# Patient Record
Sex: Male | Born: 1935 | Race: White | Hispanic: No
Health system: Southern US, Community
[De-identification: ages and names within clinical notes are randomized; demographics above are authoritative.]

## PROBLEM LIST (undated history)

## (undated) DIAGNOSIS — I739 Peripheral vascular disease, unspecified: Secondary | ICD-10-CM

## (undated) DIAGNOSIS — I509 Heart failure, unspecified: Secondary | ICD-10-CM

## (undated) DIAGNOSIS — I219 Acute myocardial infarction, unspecified: Secondary | ICD-10-CM

## (undated) DIAGNOSIS — I10 Essential (primary) hypertension: Secondary | ICD-10-CM

## (undated) DIAGNOSIS — N189 Chronic kidney disease, unspecified: Secondary | ICD-10-CM

## (undated) DIAGNOSIS — I2510 Atherosclerotic heart disease of native coronary artery without angina pectoris: Secondary | ICD-10-CM

## (undated) DIAGNOSIS — E119 Type 2 diabetes mellitus without complications: Secondary | ICD-10-CM

## (undated) DIAGNOSIS — Z951 Presence of aortocoronary bypass graft: Secondary | ICD-10-CM

## (undated) HISTORY — PX: CARPAL TUNNEL RELEASE: SHX101

## (undated) HISTORY — PX: CHOLECYSTECTOMY: SHX55

## (undated) HISTORY — PX: TONSILLECTOMY: SUR1361

## (undated) HISTORY — DX: Heart failure, unspecified: I50.9

## (undated) HISTORY — DX: Atherosclerotic heart disease of native coronary artery without angina pectoris: I25.10

## (undated) HISTORY — DX: Chronic kidney disease, unspecified: N18.9

## (undated) HISTORY — PX: HEMORRHOID SURGERY: SHX153

## (undated) HISTORY — PX: OTHER SURGICAL HISTORY: SHX169

## (undated) HISTORY — DX: Type 2 diabetes mellitus without complications: E11.9

## (undated) HISTORY — PX: EYE SURGERY: SHX253

## (undated) HISTORY — DX: Peripheral vascular disease, unspecified: I73.9

---

## 2012-12-10 ENCOUNTER — Ambulatory Visit: Payer: Self-pay | Admitting: Internal Medicine

## 2014-12-19 ENCOUNTER — Ambulatory Visit: Payer: Self-pay | Admitting: Internal Medicine

## 2014-12-27 ENCOUNTER — Ambulatory Visit: Payer: Self-pay | Admitting: Internal Medicine

## 2015-01-17 DIAGNOSIS — J439 Emphysema, unspecified: Secondary | ICD-10-CM | POA: Insufficient documentation

## 2015-02-01 ENCOUNTER — Ambulatory Visit: Payer: Self-pay | Admitting: Specialist

## 2015-03-06 ENCOUNTER — Ambulatory Visit
Admit: 2015-03-06 | Disposition: A | Discharge status: Home / Self Care | Payer: Self-pay | Attending: Vascular Surgery | Admitting: Vascular Surgery

## 2015-03-06 LAB — BASIC METABOLIC PANEL
Anion Gap: 6 — ABNORMAL LOW (ref 7–16)
BUN: 14 mg/dL
CALCIUM: 9.4 mg/dL
CHLORIDE: 101 mmol/L
CO2: 28 mmol/L
Creatinine: 1.08 mg/dL
EGFR (African American): >60
EGFR (Non-African Amer.): >60
Glucose: 107 mg/dL — ABNORMAL HIGH
POTASSIUM: 4.3 mmol/L
Sodium: 135 mmol/L

## 2015-03-06 LAB — CBC
HCT: 38.5 % — ABNORMAL LOW (ref 40.0–52.0)
HGB: 12.7 g/dL — AB (ref 13.0–18.0)
MCH: 32 pg (ref 26.0–34.0)
MCHC: 33.1 g/dL (ref 32.0–36.0)
MCV: 97 fL (ref 80–100)
Platelet: 249 10*3/uL (ref 150–440)
RBC: 3.97 10*6/uL — AB (ref 4.40–5.90)
RDW: 12.8 % (ref 11.5–14.5)
WBC: 7.5 10*3/uL (ref 3.8–10.6)

## 2015-03-06 LAB — PROTIME-INR
INR: 1
Prothrombin Time: 13.4 secs

## 2015-03-06 LAB — APTT: Activated PTT: 27.8 secs (ref 23.6–35.9)

## 2015-03-17 ENCOUNTER — Other Ambulatory Visit: Payer: Self-pay | Admitting: Specialist

## 2015-03-17 DIAGNOSIS — R911 Solitary pulmonary nodule: Secondary | ICD-10-CM

## 2015-03-22 ENCOUNTER — Inpatient Hospital Stay
Admit: 2015-03-22 | Disposition: A | Discharge status: Home / Self Care | Payer: Self-pay | Attending: Vascular Surgery | Admitting: Vascular Surgery

## 2015-03-23 LAB — CBC WITH DIFFERENTIAL/PLATELET
BASOS PCT: 0.5 %
Basophil #: 0 10*3/uL (ref 0.0–0.1)
Eosinophil #: 0.3 10*3/uL (ref 0.0–0.7)
Eosinophil %: 3.4 %
HCT: 34.3 % — AB (ref 40.0–52.0)
HGB: 11.5 g/dL — ABNORMAL LOW (ref 13.0–18.0)
Lymphocyte #: 0.6 10*3/uL — ABNORMAL LOW (ref 1.0–3.6)
Lymphocyte %: 7.5 %
MCH: 32.2 pg (ref 26.0–34.0)
MCHC: 33.5 g/dL (ref 32.0–36.0)
MCV: 96 fL (ref 80–100)
MONO ABS: 0.6 x10 3/mm (ref 0.2–1.0)
Monocyte %: 8.3 %
NEUTROS ABS: 6 10*3/uL (ref 1.4–6.5)
Neutrophil %: 80.3 %
Platelet: 171 10*3/uL (ref 150–440)
RBC: 3.57 10*6/uL — ABNORMAL LOW (ref 4.40–5.90)
RDW: 12.3 % (ref 11.5–14.5)
WBC: 7.4 10*3/uL (ref 3.8–10.6)

## 2015-03-23 LAB — COMPREHENSIVE METABOLIC PANEL
ALBUMIN: 3.4 g/dL — AB
ALK PHOS: 49 U/L
Anion Gap: 7 (ref 7–16)
BILIRUBIN TOTAL: 0.7 mg/dL
BUN: 12 mg/dL
CHLORIDE: 103 mmol/L
Calcium, Total: 8.1 mg/dL — ABNORMAL LOW
Co2: 26 mmol/L
Creatinine: 1.09 mg/dL
EGFR (African American): >60
GLUCOSE: 145 mg/dL — AB
POTASSIUM: 4.3 mmol/L
SGOT(AST): 17 U/L
SGPT (ALT): 13 U/L — ABNORMAL LOW
Sodium: 136 mmol/L
Total Protein: 5.3 g/dL — ABNORMAL LOW

## 2015-03-23 LAB — APTT: Activated PTT: 29.7 secs (ref 23.6–35.9)

## 2015-03-23 LAB — PROTIME-INR
INR: 1.2
Prothrombin Time: 14.9 secs

## 2015-03-23 LAB — MAGNESIUM: Magnesium: 1.6 mg/dL — ABNORMAL LOW

## 2015-03-23 LAB — PHOSPHORUS: Phosphorus: 3.7 mg/dL

## 2015-04-02 NOTE — Op Note (Signed)
PATIENT NAME:  Peter Becker, Peter Becker MR#:  [id number] DATE OF BIRTH:  Dec 03, 1928  DATE OF PROCEDURE:  03/22/2015  PREOPERATIVE DIAGNOSES: 1.  Abdominal aortic aneurysm.  2.  Atherosclerotic occlusive disease, bilateral lower extremities, with claudication.  3.  Coronary artery disease.    POSTOPERATIVE DIAGNOSES:   1.  Abdominal aortic aneurysm.  2.  Atherosclerotic occlusive disease, bilateral lower extremities, with claudication.  3.  Coronary artery disease.    PROCEDURES PERFORMED: 1.  Introduction catheter into aorta percutaneously, right groin approach.  2.  Introduction catheter into aorta percutaneously, left groin approach.  3.  Repair abdominal aortic aneurysm with a Gore Excluder endoprosthesis.  4.  Extension left iliac with a 10 x 10 Viabahn extender.   SURGEON:  Katha Cabal, MD  CO-SURGEONS: Two.   ANESTHESIA: General by endotracheal intubation.   FLUIDS: Per anesthesia record.   ESTIMATED BLOOD LOSS: 50 mL.   SPECIMEN: None.   FLUOROSCOPY TIME: 17.5 minutes.   CONTRAST USED: Isovue 105 mL.   INDICATIONS: Peter Becker is an 81 year old gentleman with a known abdominal aortic aneurysm, which has steadily grown every 6 months. He now recently measured approximately 4.8 to 4.9 cm in diameter and because of his steady growth and the close proximity to 5, he has requested that his aneurysm be fixed. The risks and benefits were reviewed. All questions were answered. Alternative therapies have been discussed and the patient has agreed to proceed with endovascular repair.   DESCRIPTION OF PROCEDURE: The patient is taken to the special procedure suite, placed in the supine position. After adequate general anesthesia is induced and appropriate invasive monitors are placed, he is positioned supine and prepped from just below the nipple line to just above the knees. He is then draped in sterile fashion. Appropriate timeout is called.   With Dr. Lucky Cowboy, working on the left side and  myself working on the right, ultrasound is utilized and femoral access is obtained percutaneously, myself using a micropuncture kit, and Dr. Lucky Cowboy using a Seldinger needle, 6 French sheaths were then placed to be noted that ultrasound was used to observe the femoral artery, make note of its echolucent and pulsatility indicating patency, image was recorded for the permanent record on both sides and ultrasound was used real-time for the puncture.   Subsequently, Perclose devices were used in a pre-close fashion. The right side required several extra devices as the needle did not capture ultimately 2 devices were secured. This went without any incident on the left, and therefore it was elected to place the main body up the left side. Wire and pigtail catheter are then advanced up the left, a wire and KMP catheter advanced up the right. AP projection of the aorta is obtained and the renals were then marked.   Heparin 4000 units were given and allowed to circulate for approximately 5 minutes.   The Amplatz Super Stiff wire is then advanced through the pigtail catheter on the left and the sheath is exchanged for an 18 French sheath, under fluoroscopic guidance. With the KMP catheter located just above the renals, magnified image with LAO angulation is then performed. Under real-time fluoroscopic guidance, the Gore main body which is a C3 Excluder 35 x 14 x 16, is advanced through the sheath on the left, positioned just below the approximate level of the renals and a magnified image of the renals is obtained. The Gore device is then deployed.   Follow-up imaging demonstrates that is sitting just below the right renal  which is the lowest renal, landed in perfect position. The device is then opened down to expose the contralateral gate; the Kumpe catheter is pulled back into the aneurysm sac and ultimately, a C2 catheter with a Glidewire is utilized to secure the contralateral gate. Wire is then advanced. Pigtail  catheter is then advanced over the wire and twirled within the main body to verify proper positioning. Amplatz Super Stiff wire is then advanced through the pigtail catheter, and the sheath is upsized to a 12 [place].   With the pigtail catheter in position as the markers, an LAO projection of the pelvis is obtained, length measurements are made and subsequently a 14 x 10, contralateral limb is selected, prepped on the back table, and then advanced, and deployed in the contralateral gate.   CODA balloon is then used to secure both the proximal, as well as the distal portions of the graft. CODA balloon is then advanced up the left side as well. Follow-up imaging demonstrates that there is inadequate length on the left, both for treatment of the aneurysm as well as some fairly severe atherosclerotic disease at the distal margin of the stent, creating a less than ideal situation and therefore the oblique magnified view is obtained, a 10 x 10 Viabahn is then deployed across this extending into the external iliac to allow for proper seal and treat this lesion that is present at the origin of the external. The right side is re-dilated with a 14 balloon, the left side proximally across the Viabahn is dilated with a 14 balloon, and distally is dilated with an 8 balloon. Follow-up imaging now for evaluation of endoleaks does determine that there is an indeterminate endoleak. There does appear to be a pair of lumbars suggesting this is a type 2; however, re- angioplastied as noted above, is performed. Several different oblique views are obtained ultimately this appears to be diminished with each image and does not appear to be a type 1; therefore, will allow for reversal of his anticoagulation and follow-up scans will be noted to see if this is indeed a persistent type 2 endoleak.   Both sheaths are removed, and the Perclose device is secured successfully. Small puncture sites were closed with 4-0 Monocryl and Dermabond,  followed by safeguards.   The patient tolerated the procedure well. There were no immediate complications. Sponge and needle counts are correct and he was taken to the recovery room in stable condition.    ____________________________ Katha Cabal, MD ggs:nt D: 03/22/2015 13:09:11 ET T: 03/22/2015 16:37:28 ET JOB#: [id number]  cc: Katha Cabal, MD, <Dictator> Katha Cabal MD ELECTRONICALLY SIGNED 03/28/2015 12:48

## 2015-04-02 NOTE — Op Note (Signed)
PATIENT NAME:  Peter Becker, Peter Becker MR#:  [id number] DATE OF BIRTH:  02-23-1929  DATE OF PROCEDURE:  03/22/2015  This will be a co-surgeon note along with Levora Dredge, MD who will be dictating a co-surgeon note as well.    PREOPERATIVE DIAGNOSES:  1.  Abdominal aortic aneurysm.  2.  Peripheral arterial disease with iliac artery stenosis.  3.  Diabetes.  4.  Coronary disease.   POSTOPERATIVE DIAGNOSES:  1.  Abdominal aortic aneurysm.  2.  Peripheral arterial disease with iliac artery stenosis.  3.  Diabetes.  4.  Coronary disease.   PROCEDURES:  1.  Ultrasound guidance for vascular access to bilateral femoral arteries; right by Dr. Gilda Crease,  left by Dr. Wyn Quaker.  2.  Catheter placement to aorta from bilateral femoral approaches, right by Dr. Gilda Crease, left by Dr. Wyn Quaker.  3.  Placement of a Gore Excluder endoprosthesis, main body left with a 35 mm diameter x 16 cm length and 14 mm diameter x 10 cm length contralateral limb on the right, co-surgeons Dr. Gilda Crease and Dr. Wyn Quaker.   4.  Placement of a left iliac extension stent graft with a 10 mm diameter x 10 cm length Viabahn stent, co-surgeons Dr. Gilda Crease and Dr. Wyn Quaker.   5.  Pergolide closure device bilateral femoral arteries, right by Dr. Gilda Crease, left by Dr. Wyn Quaker.     ANESTHESIA: General.   ESTIMATED BLOOD LOSS: 25 mL.   FLUOROSCOPY TIME: 17.3 minutes.   CONTRAST USED: 105 mL.   INDICATION FOR PROCEDURE: This is an 81 year old gentleman with an abdominal aortic aneurysm of acceptable size for repair approaching 5 cm.  He also has significant aortoiliac disease concomitant with his aneurysm. He is brought in for endovascular repair. Risks and benefits are discussed. Informed consent was obtained.   DESCRIPTION OF PROCEDURE: The patient was brought to the operative suite. After an adequate level of general anesthesia was obtained the abdomen and groins were sterilely prepped and draped and a sterile surgical field was created. We started by gaining  percutaneous access to each femoral artery. Dr. Gilda Crease performed the right while I performed the left. Ultrasound was used to visualize the femoral arteries and they were accessed at their least calcified point possible under direct ultrasound guidance with micropuncture needles and permanent images were recorded.  We upsized to 6 [place] sheaths. We then placed J-wires and put 2 ProGlides in a crossing fashion at each femoral artery in a preclose fashion and placed 8 French sheaths, heparinizing the patient with 5000 units of intravenous heparin. A pigtail catheter was placed up the left and an AP aortogram was performed. This demonstrated the expected infrarenal abdominal aortic aneurysm with a reasonably short neck. We then upsized over an Amplatz Super Stiff wire to an 18 French sheath on the left.  Dr. Gilda Crease placed a Kumpe catheter up from the right side and magnified imaging of the renal arteries performed. We deployed the graft in an anatomic configuration at the base of the right renal artery, which was lower.  At this point a puff of contrast done through the Kumpe catheter, which showed deployment in an excellent location. Dr. Gilda Crease then cannulated the contralateral gate without difficulty, twirling the pigtail catheter in the main body and shooting a retrograde arteriogram from the right femoral approach. With this we elected to place a 14 mm diameter x 10 cm right iliac limb that went to less than 1 cm above the hypogastric artery. We completed deployment of the main body. This demonstrated  significant stenosis and tortuosity of the external iliac artery. We needed to extend the stent graft down. Hypogastric artery was small and diseased and we elected to cover this to gain full seal of the aneurysm as well as treat the stenosis in the external iliac artery and fully cover the aneurysm, gaining good apposition throughout the iliacs. Due to the tortuosity the iliacs we elected to use a 10 mm diameter  x 10 cm length Viabahn stent graft. This was deployed from the left femoral sheath, ironed out with a 14 mm balloon within the limb in the distal aorta and proximal common iliac artery, and then with an 8 mm noncompliant balloon down into the external iliac artery. Completion angiogram was then performed. There was excellent flow through the stent graft, there was an indeterminate endoleak present at completion. It was unclear if this was a type 2. For the possibility this might be a small type 3 endoleak a 14 mm balloon was used at the contralateral limb and main body junction. It should be noted that the coated balloon was used to touch all seal points and junction points initially. At the conclusion of the procedure we had 2 patent renal arteries, the right hypogastric artery was widely patent, we had elected to cover the left hypogastric artery which was small and diseased to gain full seal of the aneurysm, and we had good flow through the stent graft. We now elected to terminate the procedure. The sheaths were removed. The ProGlides were secured on each side with good hemostasis, and the skin incision was closed with a single 4-0 Monocryl. Dermabond and pressure dressings were placed. The patient tolerated the procedure well and was taken to the recovery room in stable condition.    ____________________________ Annice NeedyJason S. Larone Kliethermes, MD jsd:bu D: 03/22/2015 12:41:46 ET T: 03/22/2015 16:18:07 ET JOB#: [id number]  cc: Annice NeedyJason S. Nakiea Metzner, MD, <Dictator> Annice NeedyJASON S Juneau Doughman MD ELECTRONICALLY SIGNED 03/23/2015 12:54

## 2015-06-28 ENCOUNTER — Other Ambulatory Visit: Payer: Self-pay

## 2015-07-17 ENCOUNTER — Ambulatory Visit
Admission: RE | Admit: 2015-07-17 | Discharge: 2015-07-17 | Disposition: A | Discharge status: Home / Self Care | Payer: Medicare Other | Source: Ambulatory Visit | Attending: Specialist | Admitting: Specialist

## 2015-07-17 DIAGNOSIS — I517 Cardiomegaly: Secondary | ICD-10-CM | POA: Insufficient documentation

## 2015-07-17 DIAGNOSIS — R918 Other nonspecific abnormal finding of lung field: Secondary | ICD-10-CM | POA: Insufficient documentation

## 2015-07-17 DIAGNOSIS — J439 Emphysema, unspecified: Secondary | ICD-10-CM | POA: Insufficient documentation

## 2015-07-17 DIAGNOSIS — R911 Solitary pulmonary nodule: Secondary | ICD-10-CM

## 2015-07-31 ENCOUNTER — Other Ambulatory Visit: Payer: Self-pay | Admitting: Specialist

## 2015-07-31 DIAGNOSIS — R918 Other nonspecific abnormal finding of lung field: Secondary | ICD-10-CM

## 2016-01-17 ENCOUNTER — Ambulatory Visit
Admission: RE | Admit: 2016-01-17 | Discharge: 2016-01-17 | Disposition: A | Discharge status: Home / Self Care | Payer: Medicare Other | Source: Ambulatory Visit | Attending: Specialist | Admitting: Specialist

## 2016-01-17 DIAGNOSIS — Z87891 Personal history of nicotine dependence: Secondary | ICD-10-CM | POA: Insufficient documentation

## 2016-01-17 DIAGNOSIS — R918 Other nonspecific abnormal finding of lung field: Secondary | ICD-10-CM | POA: Diagnosis present

## 2016-01-25 ENCOUNTER — Other Ambulatory Visit: Payer: Self-pay | Admitting: Specialist

## 2016-01-25 DIAGNOSIS — R918 Other nonspecific abnormal finding of lung field: Secondary | ICD-10-CM

## 2016-07-16 ENCOUNTER — Ambulatory Visit
Admission: RE | Admit: 2016-07-16 | Discharge: 2016-07-16 | Disposition: A | Discharge status: Home / Self Care | Payer: Medicare Other | Source: Ambulatory Visit | Attending: Specialist | Admitting: Specialist

## 2016-07-16 DIAGNOSIS — R918 Other nonspecific abnormal finding of lung field: Secondary | ICD-10-CM | POA: Insufficient documentation

## 2016-07-16 DIAGNOSIS — I700 Atherosclerosis of aorta: Secondary | ICD-10-CM | POA: Insufficient documentation

## 2016-07-25 ENCOUNTER — Other Ambulatory Visit: Payer: Self-pay | Admitting: Specialist

## 2016-07-25 DIAGNOSIS — R918 Other nonspecific abnormal finding of lung field: Secondary | ICD-10-CM

## 2016-10-16 ENCOUNTER — Ambulatory Visit
Admission: RE | Admit: 2016-10-16 | Discharge: 2016-10-16 | Disposition: A | Discharge status: Home / Self Care | Payer: Medicare Other | Source: Ambulatory Visit | Attending: Specialist | Admitting: Specialist

## 2016-10-16 DIAGNOSIS — R918 Other nonspecific abnormal finding of lung field: Secondary | ICD-10-CM | POA: Diagnosis present

## 2016-10-21 ENCOUNTER — Other Ambulatory Visit: Payer: Self-pay | Admitting: Specialist

## 2016-10-21 DIAGNOSIS — R911 Solitary pulmonary nodule: Secondary | ICD-10-CM

## 2016-12-02 DIAGNOSIS — I639 Cerebral infarction, unspecified: Secondary | ICD-10-CM

## 2016-12-02 HISTORY — DX: Cerebral infarction, unspecified: I63.9

## 2017-01-20 ENCOUNTER — Ambulatory Visit (INDEPENDENT_AMBULATORY_CARE_PROVIDER_SITE_OTHER): Payer: Medicare Other

## 2017-01-20 ENCOUNTER — Encounter (INDEPENDENT_AMBULATORY_CARE_PROVIDER_SITE_OTHER): Payer: Self-pay | Admitting: Vascular Surgery

## 2017-01-20 ENCOUNTER — Other Ambulatory Visit (INDEPENDENT_AMBULATORY_CARE_PROVIDER_SITE_OTHER): Payer: Self-pay | Admitting: Vascular Surgery

## 2017-01-20 ENCOUNTER — Ambulatory Visit (INDEPENDENT_AMBULATORY_CARE_PROVIDER_SITE_OTHER): Payer: Medicare Other | Admitting: Vascular Surgery

## 2017-01-20 DIAGNOSIS — I714 Abdominal aortic aneurysm, without rupture, unspecified: Secondary | ICD-10-CM

## 2017-01-20 DIAGNOSIS — I2510 Atherosclerotic heart disease of native coronary artery without angina pectoris: Secondary | ICD-10-CM | POA: Insufficient documentation

## 2017-01-20 DIAGNOSIS — Z48812 Encounter for surgical aftercare following surgery on the circulatory system: Secondary | ICD-10-CM

## 2017-01-20 DIAGNOSIS — Z8679 Personal history of other diseases of the circulatory system: Secondary | ICD-10-CM

## 2017-01-20 DIAGNOSIS — I739 Peripheral vascular disease, unspecified: Secondary | ICD-10-CM | POA: Insufficient documentation

## 2017-01-20 DIAGNOSIS — I10 Essential (primary) hypertension: Secondary | ICD-10-CM

## 2017-01-20 DIAGNOSIS — E785 Hyperlipidemia, unspecified: Secondary | ICD-10-CM | POA: Insufficient documentation

## 2017-01-20 DIAGNOSIS — I209 Angina pectoris, unspecified: Secondary | ICD-10-CM

## 2017-01-20 DIAGNOSIS — Z9889 Other specified postprocedural states: Secondary | ICD-10-CM

## 2017-01-20 DIAGNOSIS — I872 Venous insufficiency (chronic) (peripheral): Secondary | ICD-10-CM | POA: Insufficient documentation

## 2017-01-20 DIAGNOSIS — E782 Mixed hyperlipidemia: Secondary | ICD-10-CM | POA: Diagnosis not present

## 2017-01-20 DIAGNOSIS — I25118 Atherosclerotic heart disease of native coronary artery with other forms of angina pectoris: Secondary | ICD-10-CM

## 2017-01-20 NOTE — Progress Notes (Signed)
MRN : [id number]  Peter FretRalph Becker is a 83 y.o. (04/24/1929) male who presents with chief complaint of  Chief Complaint  Patient presents with  . Follow-up  .  History of Present Illness: The patient returns to the office for surveillance of an abdominal aortic aneurysm status post stent graft placement on 03/22/2015.   Patient denies abdominal pain or back pain, no other abdominal complaints. No groin related complaints. No symptoms consistent with distal embolization No changes in claudication distance.   There have been no interval changes in his overall healthcare since his last visit.   Patient denies amaurosis fugax or TIA symptoms. There is no history of claudication or rest pain symptoms of the lower extremities. The patient denies angina or shortness of breath.   Duplex US of the aorta and iliac arteries shows a 3.9 cm AAA sac with no endoleak, decrease in the sac compared to the previous study. Current Meds  Medication Sig  . Ascorbic Acid (VITAMIN C) 1000 MG tablet Take by mouth.  . chlorpheniramine (CHLOR-TRIMETON) 4 MG tablet Take by mouth.  . Cholecalciferol (VITAMIN D3) 2000 units capsule Take by mouth.  . cyanocobalamin (,VITAMIN B-12,) 1000 MCG/ML injection INJECT 1 ML IN THE MUSCLE EVERY 14 DAYS  . fluticasone (FLONASE) 50 MCG/ACT nasal spray Place into the nose.  Marland Kitchen. glyBURIDE-metformin (GLUCOVANCE) 2.5-500 MG tablet   . losartan (COZAAR) 50 MG tablet TK 1 T PO QD  . lovastatin (MEVACOR) 20 MG tablet Take by mouth.  . metoprolol tartrate (LOPRESSOR) 25 MG tablet Take by mouth.  . Multiple Vitamin (MULTI-VITAMINS) TABS Take by mouth.  . Multiple Vitamins-Minerals (OCUVITE EXTRA PO) Take by mouth.  . Omega-3 Fatty Acids (FISH OIL) 1000 MG CAPS Take by mouth daily.  . vitamin E 400 UNIT capsule Take by mouth.    Past Medical History:  Diagnosis Date  . Diabetes mellitus without complication [facility](HCC)     Past Surgical History:  Procedure Laterality Date  . CARPAL  TUNNEL RELEASE Left   . CHOLECYSTECTOMY    . EYE SURGERY    . heart stent     triple bypass  . HEMORRHOID SURGERY    . TONSILLECTOMY      Social History Social History  Substance Use Topics  . Smoking status: Former Games developermoker  . Smokeless tobacco: Never Used  . Alcohol use No    Family History Family History  Problem Relation Age of Onset  . Heart attack Father   No family history of bleeding/clotting disorders, porphyria or autoimmune disease   No Known Allergies   REVIEW OF SYSTEMS (Negative unless checked)  Constitutional: [] Weight loss  [] Fever  [] Chills Cardiac: [] Chest pain   [] Chest pressure   [] Palpitations   [] Shortness of breath when laying flat   [] Shortness of breath with exertion. Vascular:  [] Pain in legs with walking   [] Pain in legs at rest  [] History of DVT   [] Phlebitis   [] Swelling in legs   [] Varicose veins   [] Non-healing ulcers Pulmonary:   [] Uses home oxygen   [] Productive cough   [] Hemoptysis   [] Wheeze  [] COPD   [] Asthma Neurologic:  [] Dizziness   [] Seizures   [] History of stroke   [] History of TIA  [] Aphasia   [] Vissual changes   [] Weakness or numbness in arm   [] Weakness or numbness in leg Musculoskeletal:   [] Joint swelling   [] Joint pain   [] Low back pain Hematologic:  [] Easy bruising  [] Easy bleeding   [] Hypercoagulable state   []   Anemic Gastrointestinal:  [] Diarrhea   [] Vomiting  [] Gastroesophageal reflux/heartburn   [] Difficulty swallowing. Genitourinary:  [] Chronic kidney disease   [] Difficult urination  [] Frequent urination   [] Blood in urine Skin:  [] Rashes   [] Ulcers  Psychological:  [] History of anxiety   []  History of major depression.  Physical Examination  Vitals:   01/20/17 1002  BP: (!) 154/75  Pulse: 60  Resp: 16  Weight: 134 lb (60.8 kg)  Height: 5\' 7"  (1.702 m)   Body mass index is 20.99 kg/m. Gen: WD/WN, NAD Head: [place]/AT, No temporalis wasting.  Ear/Nose/Throat: Hearing grossly intact, nares w/o erythema or drainage, poor  dentition Eyes: PER, EOMI, sclera nonicteric.  Neck: Supple, no masses.  No bruit or JVD.  Pulmonary:  Good air movement, clear to auscultation bilaterally, no use of accessory muscles.  Cardiac: RRR, normal S1, S2, no Murmurs. Vascular:  Vessel Right Left  Radial Palpable Palpable  Ulnar Palpable Palpable  Brachial Palpable Palpable  Carotid Palpable Palpable  Femoral Palpable Palpable  Popliteal Palpable Palpable  PT Palpable Palpable  DP Palpable Palpable   Gastrointestinal: soft, non-distended. No guarding/no peritoneal signs.  Musculoskeletal: M/S 5/5 throughout.  No deformity or atrophy.  Neurologic: CN 2-12 intact. Pain and light touch intact in extremities.  Symmetrical.  Speech is fluent. Motor exam as listed above. Psychiatric: Judgment intact, Mood & affect appropriate for pt's clinical situation. Dermatologic: No rashes or ulcers noted.  No changes consistent with cellulitis. Lymph : No Cervical lymphadenopathy, no lichenification or skin changes of chronic lymphedema.  CBC Lab Results  Component Value Date   WBC 7.4 03/23/2015   HGB 11.5 (L) 03/23/2015   HCT 34.3 (L) 03/23/2015   MCV 96 03/23/2015   PLT 171 03/23/2015    BMET    Component Value Date/Time   NA 136 03/23/2015 0508   K 4.3 03/23/2015 0508   CL 103 03/23/2015 0508   CO2 26 03/23/2015 0508   GLUCOSE 145 (H) 03/23/2015 0508   BUN 12 03/23/2015 0508   CREATININE 1.09 03/23/2015 0508   CALCIUM 8.1 (L) 03/23/2015 0508   GFRNONAA >60 03/23/2015 0508   GFRAA >60 03/23/2015 0508   CrCl cannot be calculated (Patient's most recent lab result is older than the maximum 21 days allowed.).  COAG Lab Results  Component Value Date   INR 1.2 03/23/2015   INR 1.0 03/06/2015    Radiology No results found.  Assessment/Plan 1. AAA (abdominal aortic aneurysm) without rupture (HCC) Recommend: Patient is status post successful endovascular repair of the AAA.   No further intervention is required at  this time.   No endoleak is detected and the aneurysm sac is stable.  The patient will continue antiplatelet therapy as prescribed as well as aggressive management of hyperlipidemia. Exercise is again strongly encouraged.   However, endografts require continued surveillance with ultrasound or CT scan. This is mandatory to detect any changes that allow repressurization of the aneurysm sac.  The patient is informed that this would be asymptomatic.  The patient is reminded that lifelong routine surveillance is a necessity with an endograft. Patient will continue to follow-up at 6 month intervals with ultrasound of the aorta. - VAS US AORTA/IVC/ILIACS; Future  2. PAD (peripheral artery disease) (HCC)  Recommend:  The patient has evidence of atherosclerosis of the lower extremities with claudication.  The patient does not voice lifestyle limiting changes at this point in time.  Noninvasive studies do not suggest clinically significant change.  No invasive studies, angiography or  surgery at this time The patient should continue walking and begin a more formal exercise program.  The patient should continue antiplatelet therapy and aggressive treatment of the lipid abnormalities  No changes in the patient's medications at this time  The patient should continue wearing graduated compression socks 10-15 mmHg strength to control the mild edema.   - VAS [place] ABI WITH/WO TBI; Future  3. Coronary artery disease of native artery of native heart with stable angina pectoris (HCC) Continue cardiac and antihypertensive medications as already ordered and reviewed, no changes at this time.  Continue statin as ordered and reviewed, no changes at this time  Nitrates PRN for chest pain   4. Essential hypertension Continue antihypertensive medications as already ordered, these medications have been reviewed and there are no changes at this time.   5. Mixed hyperlipidemia Continue statin as ordered and  reviewed, no changes at this time     Levora Dredge, MD  01/20/2017 11:05 AM

## 2017-01-25 ENCOUNTER — Emergency Department
Admission: EM | Admit: 2017-01-25 | Discharge: 2017-01-25 | Disposition: A | Discharge status: Home / Self Care | Payer: Medicare Other | Attending: Emergency Medicine | Admitting: Emergency Medicine

## 2017-01-25 ENCOUNTER — Encounter: Payer: Self-pay | Admitting: Emergency Medicine

## 2017-01-25 ENCOUNTER — Emergency Department: Payer: Medicare Other

## 2017-01-25 DIAGNOSIS — M25552 Pain in left hip: Secondary | ICD-10-CM | POA: Diagnosis not present

## 2017-01-25 DIAGNOSIS — E119 Type 2 diabetes mellitus without complications: Secondary | ICD-10-CM | POA: Diagnosis not present

## 2017-01-25 DIAGNOSIS — Z79899 Other long term (current) drug therapy: Secondary | ICD-10-CM | POA: Diagnosis not present

## 2017-01-25 DIAGNOSIS — I10 Essential (primary) hypertension: Secondary | ICD-10-CM | POA: Insufficient documentation

## 2017-01-25 DIAGNOSIS — Z87891 Personal history of nicotine dependence: Secondary | ICD-10-CM | POA: Insufficient documentation

## 2017-01-25 DIAGNOSIS — I2510 Atherosclerotic heart disease of native coronary artery without angina pectoris: Secondary | ICD-10-CM | POA: Insufficient documentation

## 2017-01-25 MED ORDER — TRAMADOL HCL 50 MG PO TABS
50.0000 mg | ORAL_TABLET | Freq: Once | ORAL | Status: AC
  Administered 2017-01-25: 50 mg via ORAL
  Filled 2017-01-25: qty 1

## 2017-01-25 MED ORDER — TRAMADOL HCL 50 MG PO TABS: 50.0000 mg | ORAL_TABLET | Freq: Four times a day (QID) | ORAL | 0 refills | Status: DC | PRN

## 2017-01-25 NOTE — ED Provider Notes (Signed)
[facility] Emergency Department Provider Note   ____________________________________________   First MD Initiated Contact with Patient 01/25/17 1124     (approximate)  I have reviewed the triage vital signs and the nursing notes.   HISTORY  Chief Complaint Hip Pain    HPI Peter Becker is a 83 y.o. male is here with complaint of left hip pain. Patient denies any injury to his hip. He states yesterday he woke up and had pain with weightbearing. He states that he then walked to the mailbox without pain. He then had pain intermittently last evening and then again this morning. Patient is unaware of any arthritis and denies previous problems with his hip. He is not on any anti-inflammatories. He has not taken any over-the-counter medication for his pain. He denies any leg pain other than just in his hip and both he and wife deny any swelling of his leg. Patient rates his pain as a 3/10.   Past Medical History:  Diagnosis Date  . Diabetes mellitus without complication [facility](HCC)     Patient Active Problem List   Diagnosis Date Noted  . AAA (abdominal aortic aneurysm) without rupture (HCC) 01/20/2017  . PAD (peripheral artery disease) (HCC) 01/20/2017  . CAD (coronary artery disease) 01/20/2017  . Essential hypertension 01/20/2017  . Hyperlipidemia 01/20/2017  . Chronic venous insufficiency 01/20/2017  . Venous stasis dermatitis 01/20/2017    Past Surgical History:  Procedure Laterality Date  . CARPAL TUNNEL RELEASE Left   . CHOLECYSTECTOMY    . EYE SURGERY    . heart stent     triple bypass  . HEMORRHOID SURGERY    . TONSILLECTOMY      Prior to Admission medications   Medication Sig Start Date End Date Taking? Authorizing Provider  Ascorbic Acid (VITAMIN C) 1000 MG tablet Take by mouth.    Historical Provider, MD  chlorpheniramine (CHLOR-TRIMETON) 4 MG tablet Take by mouth.    Historical Provider, MD  Cholecalciferol (VITAMIN D3) 2000 units capsule  Take by mouth.    Historical Provider, MD  cyanocobalamin (,VITAMIN B-12,) 1000 MCG/ML injection INJECT 1 ML IN THE MUSCLE EVERY 14 DAYS 09/02/16   Historical Provider, MD  fluticasone (FLONASE) 50 MCG/ACT nasal spray Place into the nose.    Historical Provider, MD  glyBURIDE-metformin (GLUCOVANCE) 2.5-500 MG tablet  11/18/16   Historical Provider, MD  losartan (COZAAR) 50 MG tablet TK 1 T PO QD 01/19/16   Historical Provider, MD  lovastatin (MEVACOR) 20 MG tablet Take by mouth. 12/20/16   Historical Provider, MD  metoprolol tartrate (LOPRESSOR) 25 MG tablet Take by mouth. 09/07/15   Historical Provider, MD  Multiple Vitamin (MULTI-VITAMINS) TABS Take by mouth.    Historical Provider, MD  Multiple Vitamins-Minerals (OCUVITE EXTRA PO) Take by mouth.    Historical Provider, MD  Omega-3 Fatty Acids (FISH OIL) 1000 MG CAPS Take by mouth daily.    Historical Provider, MD  traMADol (ULTRAM) 50 MG tablet Take 1 tablet (50 mg total) by mouth every 6 (six) hours as needed for moderate pain. 01/25/17   Tommi Rumpshonda L Denajah Farias, PA-C  vitamin E 400 UNIT capsule Take by mouth.    Historical Provider, MD    Allergies Patient has no known allergies.  Family History  Problem Relation Age of Onset  . Heart attack Father     Social History Social History  Substance Use Topics  . Smoking status: Former Games developermoker  . Smokeless tobacco: Never Used  . Alcohol use No  Review of Systems Constitutional: No fever/chills Cardiovascular: Denies chest pain. Respiratory: Denies shortness of breath. Gastrointestinal: No abdominal pain.  No nausea, no vomiting.   Musculoskeletal: Negative for back pain.  Positive for left hip pain. Skin: Negative for rash. Neurological: Negative for headaches, focal weakness or numbness.  10-point ROS otherwise negative.  ____________________________________________   PHYSICAL EXAM:  VITAL SIGNS: ED Triage Vitals  Enc Vitals Group     BP 01/25/17 1042 (!) 159/78     Pulse Rate  01/25/17 1042 (!) 57     Resp 01/25/17 1042 16     Temp 01/25/17 1042 97.8 F (36.6 C)     Temp Source 01/25/17 1042 Oral     SpO2 01/25/17 1042 100 %     Weight 01/25/17 1038 134 lb (60.8 kg)     Height --      Head Circumference --      Peak Flow --      Pain Score 01/25/17 1038 3     Pain Loc --      Pain Edu? --      Excl. in GC? --     Constitutional: Alert and oriented. Well appearing and in no acute distress. Eyes: Conjunctivae are normal. PERRL. EOMI. Head: Atraumatic. Nose: No congestion/rhinnorhea. Neck: No stridor.   Cardiovascular: Normal rate, regular rhythm. Grossly normal heart sounds.  Good peripheral circulation. Respiratory: Normal respiratory effort.  No retractions. Lungs CTAB. Gastrointestinal: Soft and nontender. No distention.  Musculoskeletal: Examination of the left hip but there is no gross deformity. Range of motion is without pain. No crepitus was appreciated. Patient is able to abduct and adduct left lower extremity without pain. No swelling is noted and no soft tissue tenderness is present. There is no ecchymosis, erythema or abrasions noted. Neurologic:  Normal speech and language. No gross focal neurologic deficits are appreciated.  Skin:  Skin is warm, dry and intact. As noted above. Psychiatric: Mood and affect are normal. Speech and behavior are normal.  ____________________________________________   LABS (all labs ordered are listed, but only abnormal results are displayed)  Labs Reviewed - No data to display  RADIOLOGY  Left hip x-ray per radiologist: No acute abnormality. Mild osteopenia I, Tommi Rumps, personally viewed and evaluated these images (plain radiographs) as part of my medical decision making, as well as reviewing the written report by the radiologist. ____________________________________________   PROCEDURES  Procedure(s) performed: None  Procedures  Critical Care performed:  No  ____________________________________________   INITIAL IMPRESSION / ASSESSMENT AND PLAN / ED COURSE  Pertinent labs & imaging results that were available during my care of the patient were reviewed by me and considered in my medical decision making (see chart for details).  Patient was made aware that there was no acute injury to his hip. Patient is aware that he has osteopenia and is taking vitamin D. Patient was given tramadol while in the emergency room for his pain. He was also discharged with prescription for tramadol. Patient walks with his walker at this time because of pain. He is also made aware that this medication could cause drowsiness increase his risk for falling. He is to follow-up with his primary care doctor at [facility].      ____________________________________________   FINAL CLINICAL IMPRESSION(S) / ED DIAGNOSES  Final diagnoses:  Left hip pain      NEW MEDICATIONS STARTED DURING THIS VISIT:  Discharge Medication List as of 01/25/2017 12:35 PM    START taking these  medications   Details  traMADol (ULTRAM) 50 MG tablet Take 1 tablet (50 mg total) by mouth every 6 (six) hours as needed for moderate pain., Starting Sat 01/25/2017, Print         Note:  This document was prepared using Dragon voice recognition software and may include unintentional dictation errors.    Tommi Rumps, PA-C 01/25/17 1251    Myrna Blazer, MD 01/25/17 [phone]

## 2017-01-25 NOTE — ED Triage Notes (Signed)
Pt to ed with c/o left hip pain worse with ambulation, denies injury, better during the day yesterday but worse last night.  Ambulating well in waiting room with walker.

## 2017-01-25 NOTE — ED Notes (Signed)
Pt alert and oriented X4, active, cooperative, pt in NAD. RR even and unlabored, color WNL.  Pt informed to return if any life threatening symptoms occur.   

## 2017-01-25 NOTE — ED Triage Notes (Signed)
Wanted to get xray to make sure everything ok.

## 2017-01-25 NOTE — ED Notes (Signed)
Left hip pain with no known injury. PT able to ambulate with walker.

## 2017-01-25 NOTE — Discharge Instructions (Signed)
Call and make  an appointment with your primary care doctor if any continued problems. Tramadol 1 tablet every 6 hours if needed for pain. Use your walker when up walking. This medication could cause drowsiness and increase your  risk for falling.  You may also need to take a stool softener with the pain medication to prevent constipation.

## 2017-02-11 ENCOUNTER — Ambulatory Visit
Admission: RE | Admit: 2017-02-11 | Discharge: 2017-02-11 | Disposition: A | Discharge status: Home / Self Care | Payer: Medicare Other | Source: Ambulatory Visit | Attending: Specialist | Admitting: Specialist

## 2017-02-11 DIAGNOSIS — J432 Centrilobular emphysema: Secondary | ICD-10-CM | POA: Insufficient documentation

## 2017-02-11 DIAGNOSIS — R911 Solitary pulmonary nodule: Secondary | ICD-10-CM | POA: Insufficient documentation

## 2017-03-20 ENCOUNTER — Other Ambulatory Visit: Payer: Self-pay | Admitting: Specialist

## 2017-03-20 DIAGNOSIS — R911 Solitary pulmonary nodule: Secondary | ICD-10-CM

## 2017-09-22 ENCOUNTER — Ambulatory Visit
Admission: RE | Admit: 2017-09-22 | Discharge: 2017-09-22 | Disposition: A | Discharge status: Home / Self Care | Payer: Medicare Other | Source: Ambulatory Visit | Attending: Specialist | Admitting: Specialist

## 2017-09-22 DIAGNOSIS — R918 Other nonspecific abnormal finding of lung field: Secondary | ICD-10-CM | POA: Insufficient documentation

## 2017-09-22 DIAGNOSIS — R911 Solitary pulmonary nodule: Secondary | ICD-10-CM

## 2017-09-22 DIAGNOSIS — J439 Emphysema, unspecified: Secondary | ICD-10-CM | POA: Insufficient documentation

## 2017-09-22 DIAGNOSIS — I700 Atherosclerosis of aorta: Secondary | ICD-10-CM | POA: Insufficient documentation

## 2017-11-19 ENCOUNTER — Emergency Department: Payer: Medicare Other

## 2017-11-19 ENCOUNTER — Encounter: Payer: Self-pay | Admitting: Emergency Medicine

## 2017-11-19 ENCOUNTER — Inpatient Hospital Stay (HOSPITAL_COMMUNITY)
Admission: EM | Admit: 2017-11-19 | Discharge: 2017-11-27 | DRG: 075 | Disposition: A | Discharge status: Hospice | Payer: Medicare Other | Source: Other Acute Inpatient Hospital | Attending: Internal Medicine | Admitting: Internal Medicine

## 2017-11-19 ENCOUNTER — Emergency Department
Admission: EM | Admit: 2017-11-19 | Discharge: 2017-11-19 | Disposition: A | Discharge status: Other Acute Inpatient Hospital | Payer: Medicare Other | Attending: Emergency Medicine | Admitting: Emergency Medicine

## 2017-11-19 DIAGNOSIS — Z9282 Status post administration of tPA (rtPA) in a different facility within the last 24 hours prior to admission to current facility: Secondary | ICD-10-CM | POA: Diagnosis not present

## 2017-11-19 DIAGNOSIS — I340 Nonrheumatic mitral (valve) insufficiency: Secondary | ICD-10-CM | POA: Diagnosis not present

## 2017-11-19 DIAGNOSIS — E876 Hypokalemia: Secondary | ICD-10-CM | POA: Diagnosis not present

## 2017-11-19 DIAGNOSIS — Z955 Presence of coronary angioplasty implant and graft: Secondary | ICD-10-CM | POA: Diagnosis not present

## 2017-11-19 DIAGNOSIS — R918 Other nonspecific abnormal finding of lung field: Secondary | ICD-10-CM | POA: Diagnosis not present

## 2017-11-19 DIAGNOSIS — I6502 Occlusion and stenosis of left vertebral artery: Secondary | ICD-10-CM | POA: Diagnosis present

## 2017-11-19 DIAGNOSIS — A879 Viral meningitis, unspecified: Secondary | ICD-10-CM | POA: Diagnosis present

## 2017-11-19 DIAGNOSIS — Z7984 Long term (current) use of oral hypoglycemic drugs: Secondary | ICD-10-CM | POA: Insufficient documentation

## 2017-11-19 DIAGNOSIS — Z201 Contact with and (suspected) exposure to tuberculosis: Secondary | ICD-10-CM | POA: Diagnosis present

## 2017-11-19 DIAGNOSIS — J690 Pneumonitis due to inhalation of food and vomit: Secondary | ICD-10-CM | POA: Diagnosis not present

## 2017-11-19 DIAGNOSIS — G8191 Hemiplegia, unspecified affecting right dominant side: Secondary | ICD-10-CM | POA: Diagnosis not present

## 2017-11-19 DIAGNOSIS — I252 Old myocardial infarction: Secondary | ICD-10-CM | POA: Diagnosis not present

## 2017-11-19 DIAGNOSIS — R4182 Altered mental status, unspecified: Secondary | ICD-10-CM

## 2017-11-19 DIAGNOSIS — R748 Abnormal levels of other serum enzymes: Secondary | ICD-10-CM | POA: Diagnosis not present

## 2017-11-19 DIAGNOSIS — R509 Fever, unspecified: Secondary | ICD-10-CM

## 2017-11-19 DIAGNOSIS — R778 Other specified abnormalities of plasma proteins: Secondary | ICD-10-CM

## 2017-11-19 DIAGNOSIS — E119 Type 2 diabetes mellitus without complications: Secondary | ICD-10-CM | POA: Diagnosis not present

## 2017-11-19 DIAGNOSIS — Z87891 Personal history of nicotine dependence: Secondary | ICD-10-CM | POA: Diagnosis not present

## 2017-11-19 DIAGNOSIS — R404 Transient alteration of awareness: Secondary | ICD-10-CM | POA: Diagnosis not present

## 2017-11-19 DIAGNOSIS — I639 Cerebral infarction, unspecified: Secondary | ICD-10-CM | POA: Diagnosis not present

## 2017-11-19 DIAGNOSIS — J988 Other specified respiratory disorders: Secondary | ICD-10-CM

## 2017-11-19 DIAGNOSIS — I10 Essential (primary) hypertension: Secondary | ICD-10-CM | POA: Diagnosis present

## 2017-11-19 DIAGNOSIS — R05 Cough: Secondary | ICD-10-CM

## 2017-11-19 DIAGNOSIS — Z8249 Family history of ischemic heart disease and other diseases of the circulatory system: Secondary | ICD-10-CM

## 2017-11-19 DIAGNOSIS — R531 Weakness: Secondary | ICD-10-CM

## 2017-11-19 DIAGNOSIS — R569 Unspecified convulsions: Secondary | ICD-10-CM | POA: Diagnosis not present

## 2017-11-19 DIAGNOSIS — I714 Abdominal aortic aneurysm, without rupture: Secondary | ICD-10-CM | POA: Diagnosis present

## 2017-11-19 DIAGNOSIS — Z66 Do not resuscitate: Secondary | ICD-10-CM | POA: Diagnosis not present

## 2017-11-19 DIAGNOSIS — Z515 Encounter for palliative care: Secondary | ICD-10-CM | POA: Diagnosis not present

## 2017-11-19 DIAGNOSIS — E049 Nontoxic goiter, unspecified: Secondary | ICD-10-CM | POA: Diagnosis not present

## 2017-11-19 DIAGNOSIS — L8990 Pressure ulcer of unspecified site, unspecified stage: Secondary | ICD-10-CM

## 2017-11-19 DIAGNOSIS — R29818 Other symptoms and signs involving the nervous system: Secondary | ICD-10-CM | POA: Diagnosis not present

## 2017-11-19 DIAGNOSIS — R410 Disorientation, unspecified: Secondary | ICD-10-CM | POA: Diagnosis not present

## 2017-11-19 DIAGNOSIS — R7989 Other specified abnormal findings of blood chemistry: Secondary | ICD-10-CM

## 2017-11-19 DIAGNOSIS — R1310 Dysphagia, unspecified: Secondary | ICD-10-CM | POA: Diagnosis not present

## 2017-11-19 DIAGNOSIS — R4701 Aphasia: Secondary | ICD-10-CM | POA: Diagnosis not present

## 2017-11-19 DIAGNOSIS — E1151 Type 2 diabetes mellitus with diabetic peripheral angiopathy without gangrene: Secondary | ICD-10-CM | POA: Diagnosis present

## 2017-11-19 DIAGNOSIS — M810 Age-related osteoporosis without current pathological fracture: Secondary | ICD-10-CM | POA: Diagnosis not present

## 2017-11-19 DIAGNOSIS — D62 Acute posthemorrhagic anemia: Secondary | ICD-10-CM

## 2017-11-19 DIAGNOSIS — J189 Pneumonia, unspecified organism: Secondary | ICD-10-CM

## 2017-11-19 DIAGNOSIS — Z951 Presence of aortocoronary bypass graft: Secondary | ICD-10-CM | POA: Diagnosis not present

## 2017-11-19 DIAGNOSIS — I2510 Atherosclerotic heart disease of native coronary artery without angina pectoris: Secondary | ICD-10-CM | POA: Insufficient documentation

## 2017-11-19 DIAGNOSIS — G0490 Encephalitis and encephalomyelitis, unspecified: Secondary | ICD-10-CM

## 2017-11-19 DIAGNOSIS — J181 Lobar pneumonia, unspecified organism: Secondary | ICD-10-CM | POA: Diagnosis not present

## 2017-11-19 DIAGNOSIS — J439 Emphysema, unspecified: Secondary | ICD-10-CM | POA: Diagnosis not present

## 2017-11-19 DIAGNOSIS — R059 Cough, unspecified: Secondary | ICD-10-CM

## 2017-11-19 DIAGNOSIS — Y95 Nosocomial condition: Secondary | ICD-10-CM | POA: Diagnosis present

## 2017-11-19 DIAGNOSIS — Z79899 Other long term (current) drug therapy: Secondary | ICD-10-CM | POA: Insufficient documentation

## 2017-11-19 DIAGNOSIS — Z227 Latent tuberculosis: Secondary | ICD-10-CM

## 2017-11-19 DIAGNOSIS — E785 Hyperlipidemia, unspecified: Secondary | ICD-10-CM | POA: Diagnosis present

## 2017-11-19 DIAGNOSIS — Z9049 Acquired absence of other specified parts of digestive tract: Secondary | ICD-10-CM

## 2017-11-19 DIAGNOSIS — I739 Peripheral vascular disease, unspecified: Secondary | ICD-10-CM | POA: Diagnosis not present

## 2017-11-19 DIAGNOSIS — R627 Adult failure to thrive: Secondary | ICD-10-CM | POA: Diagnosis not present

## 2017-11-19 DIAGNOSIS — Z91048 Other nonmedicinal substance allergy status: Secondary | ICD-10-CM

## 2017-11-19 DIAGNOSIS — Z8611 Personal history of tuberculosis: Secondary | ICD-10-CM | POA: Diagnosis not present

## 2017-11-19 DIAGNOSIS — Z8673 Personal history of transient ischemic attack (TIA), and cerebral infarction without residual deficits: Secondary | ICD-10-CM | POA: Diagnosis not present

## 2017-11-19 DIAGNOSIS — R471 Dysarthria and anarthria: Secondary | ICD-10-CM | POA: Diagnosis not present

## 2017-11-19 DIAGNOSIS — I25810 Atherosclerosis of coronary artery bypass graft(s) without angina pectoris: Secondary | ICD-10-CM | POA: Diagnosis not present

## 2017-11-19 DIAGNOSIS — R7611 Nonspecific reaction to tuberculin skin test without active tuberculosis: Secondary | ICD-10-CM | POA: Diagnosis not present

## 2017-11-19 DIAGNOSIS — E1159 Type 2 diabetes mellitus with other circulatory complications: Secondary | ICD-10-CM | POA: Diagnosis not present

## 2017-11-19 HISTORY — DX: Essential (primary) hypertension: I10

## 2017-11-19 HISTORY — DX: Acute myocardial infarction, unspecified: I21.9

## 2017-11-19 LAB — COMPREHENSIVE METABOLIC PANEL
ALBUMIN: 4.2 g/dL (ref 3.5–5.0)
ALT: 17 U/L (ref 17–63)
ANION GAP: 7 (ref 5–15)
AST: 24 U/L (ref 15–41)
Alkaline Phosphatase: 65 U/L (ref 38–126)
BUN: 19 mg/dL (ref 6–20)
CO2: 26 mmol/L (ref 22–32)
Calcium: 9.5 mg/dL (ref 8.9–10.3)
Chloride: 103 mmol/L (ref 101–111)
Creatinine, Ser: 0.99 mg/dL (ref 0.61–1.24)
GFR calc Af Amer: >60 mL/min (ref >60)
GFR calc non Af Amer: >60 mL/min (ref >60)
GLUCOSE: 195 mg/dL — AB (ref 65–99)
POTASSIUM: 4.6 mmol/L (ref 3.5–5.1)
SODIUM: 136 mmol/L (ref 135–145)
TOTAL PROTEIN: 6.7 g/dL (ref 6.5–8.1)
Total Bilirubin: 0.7 mg/dL (ref 0.3–1.2)

## 2017-11-19 LAB — PROTIME-INR
INR: 1.04
PROTHROMBIN TIME: 13.5 s (ref 11.4–15.2)

## 2017-11-19 LAB — DIFFERENTIAL
BASOS PCT: 1 %
Basophils Absolute: 0 10*3/uL (ref 0–0.1)
EOS ABS: 0.3 10*3/uL (ref 0–0.7)
EOS PCT: 4 %
Lymphocytes Relative: 14 %
Lymphs Abs: 0.9 10*3/uL — ABNORMAL LOW (ref 1.0–3.6)
Monocytes Absolute: 0.5 10*3/uL (ref 0.2–1.0)
Monocytes Relative: 8 %
NEUTROS PCT: 73 %
Neutro Abs: 4.8 10*3/uL (ref 1.4–6.5)

## 2017-11-19 LAB — CBC
HCT: 35.6 % — ABNORMAL LOW (ref 40.0–52.0)
Hemoglobin: 11.7 g/dL — ABNORMAL LOW (ref 13.0–18.0)
MCH: 31 pg (ref 26.0–34.0)
MCHC: 32.9 g/dL (ref 32.0–36.0)
MCV: 94.3 fL (ref 80.0–100.0)
PLATELETS: 240 10*3/uL (ref 150–440)
RBC: 3.78 MIL/uL — ABNORMAL LOW (ref 4.40–5.90)
RDW: 13.3 % (ref 11.5–14.5)
WBC: 6.5 10*3/uL (ref 3.8–10.6)

## 2017-11-19 LAB — TROPONIN I: Troponin I: 0.05 ng/mL (ref <0.03)

## 2017-11-19 LAB — APTT: aPTT: 29 seconds (ref 24–36)

## 2017-11-19 LAB — GLUCOSE, CAPILLARY: Glucose-Capillary: 204 mg/dL — ABNORMAL HIGH (ref 65–99)

## 2017-11-19 MED ORDER — SODIUM CHLORIDE 0.9 % IV SOLN
INTRAVENOUS | Status: DC
  Administered 2017-11-19 – 2017-11-24 (×5): via INTRAVENOUS

## 2017-11-19 MED ORDER — ALTEPLASE 100 MG IV SOLR
INTRAVENOUS | Status: AC
  Filled 2017-11-19: qty 100

## 2017-11-19 MED ORDER — ALTEPLASE (STROKE) FULL DOSE INFUSION
0.9000 mg/kg | Freq: Once | INTRAVENOUS | Status: AC
  Administered 2017-11-19: 51 mg via INTRAVENOUS

## 2017-11-19 MED ORDER — SODIUM CHLORIDE 0.9 % IV SOLN
50.0000 mL | Freq: Once | INTRAVENOUS | Status: AC
  Administered 2017-11-19: 50 mL via INTRAVENOUS

## 2017-11-19 MED ORDER — SODIUM CHLORIDE 0.9 % IV SOLN
Freq: Once | INTRAVENOUS | Status: AC
  Administered 2017-11-19: 20:00:00 via INTRAVENOUS

## 2017-11-19 MED ORDER — IOPAMIDOL (ISOVUE-370) INJECTION 76%
100.0000 mL | Freq: Once | INTRAVENOUS | Status: AC | PRN
  Administered 2017-11-19: 100 mL via INTRAVENOUS

## 2017-11-19 MED ORDER — PANTOPRAZOLE SODIUM 40 MG IV SOLR
40.0000 mg | Freq: Every day | INTRAVENOUS | Status: DC
  Administered 2017-11-19 – 2017-11-25 (×7): 40 mg via INTRAVENOUS
  Filled 2017-11-19 (×7): qty 40

## 2017-11-19 MED ORDER — LABETALOL HCL 5 MG/ML IV SOLN
10.0000 mg | INTRAVENOUS | Status: DC | PRN
  Administered 2017-11-20: 10 mg via INTRAVENOUS
  Filled 2017-11-19 (×2): qty 4

## 2017-11-19 MED ORDER — LABETALOL HCL 5 MG/ML IV SOLN
10.0000 mg | Freq: Once | INTRAVENOUS | Status: AC
  Administered 2017-11-19: 10 mg via INTRAVENOUS

## 2017-11-19 MED ORDER — LABETALOL HCL 5 MG/ML IV SOLN
INTRAVENOUS | Status: AC
  Filled 2017-11-19: qty 4

## 2017-11-19 MED ORDER — STROKE: EARLY STAGES OF RECOVERY BOOK
Freq: Once | Status: AC
  Administered 2017-11-20: 01:00:00
  Filled 2017-11-19: qty 1

## 2017-11-19 NOTE — ED Notes (Signed)
Tele neuro doc on screen for assessment

## 2017-11-19 NOTE — ED Notes (Signed)
To CT scan with RN for repeat imaging and perfusion study

## 2017-11-19 NOTE — ED Notes (Signed)
Pt remains in supine position for increased CPP

## 2017-11-19 NOTE — ED Triage Notes (Signed)
Pt in via POV with complaints of sudden onset dizziness and aphasia at approximately 1400 today.  Pt neurologically intact otherwise.  Vitals WDL.  EDP made aware.  Code stroke called and pt transported to CT.

## 2017-11-19 NOTE — ED Notes (Signed)
Asked charge to review EMTALA

## 2017-11-19 NOTE — Consult Note (Addendum)
[facility] TeleNeurology Consult Services  Date of Service: 11/19/17  Impression:  acute, transient dizziness and trouble speaking - initially resolved and I recommended against IV tpa. However, I was called back to see Mr. Elroy Channelrvin as has developed acute onset right arm weakness and global aphasia. Updated NIHSS 7.  - - -   Not a tpa candidate due to: NIHSS 1 with nondisabling symptoms. Presentation is not suggestive of Large Vessel Occlusive Disease. Thrombectomy would not be recommended.   Differential Diagnosis:   1. Cardioembolic stroke  2. Small vessel disease/lacune  3. Thromboembolic, artery-to-artery mechanism  4. Hypercoagulable state-related infarct  5. Transient ischemic attack  6. Thrombotic mechanism, large artery disease   Comments:  Door time: 1547  [facility] contacted: 1645  [facility] at bedside: 1652  NIHSS assessment time: 1657  Verbal tpa order: 1720  Needle time: 1757  Verbal Consent to tPA:  I have explained to the patient/family/guardian the nature of the patient's condition, the use of tPA fibrinolytic agent, and the benefits to be reasonably expected compared with alternative approaches. I have discussed the likelihood of major risks or complications of this procedure including (if applicable) but not limited to loss of limb function, brain damage, paralysis, hemorrhage, infection, complications from transfusion of blood components, drug reactions, blood clots and loss of life. I have also indicated that with any procedure there is always the possibility of an unexpected complication. I have explained the risks which include:  1. Death, Stroke or permanent neurologic injury (paralysis, coma, etc)  2. Worsening of stroke symptoms from swelling or bleeding in the brain  3. Bleeding in other parts of the body  4. Need for blood transfusions to replace blood or clotting factors  5. Allergic reaction to medications  6. Other  unexpected complications  All questions were answered and the patient/family/guardian express understanding of the treatment plan and consent to the procedure.  Our recommendations are outlined below.  We will be seeing the patient back in follow up as noted.  Recommendations:  IV tPA - dose = 51 mg  Routine post tPA monitoring including neuro checks and blood pressure control during/after treatment  Monitor blood pressure  Check blood pressure and NIHSS every 15 min for 2 h, then every 30 min for 6 h, and finally every hour for 16 h  Systolic greater than 180 OR diastolic greater than 105:  Option 1: Labetalol 10 mg IV for 1 - 2 min  May repeat or double labetalol every 10 min to maximum dose of 300 mg, or give initial labetalol dose, then start labetalol drip at 2 - 8 mg/min.  Option 2: Nicardipine 5 mg/h IV infusion as initial dose and titrate to desired effect by increasing 2.5 mg/h every 5 min to maximum of 15 mg/h;  If blood pressure is not controlled by labetolol or nicardipine, consider sodium nitroprusside.  Admission to ICU  CT brain 24 hours post tPA  NPO until swallowing screen performed and passed  No antiplatelet agents or anticoagulants (including heparin for DVT prophylaxis) in first 24 hours  No Foley catheter, nasogastric tube, arterial catheter or central venous catheter for 24 hr, unless absolutely necessary  Telemetry  Inpatient Neurology Consultation  Stroke evaluation as per inpatient neurology recommendations  Discussed with ED MD  -----------------------------------------------------------------------------------------  CC dizziness and difficulty speaking.   History of Present Illness   Patient is an 83 yo man with acute onset dizziness and trouble speaking starting at approx 1400 and lasting 1-2 hours.  He is back to baseline. No focal weakness of his limbs. No changes in vision. No LOC/convulsion.   Diagnostic: CT head neg for  hemorrhage.  Exam: Initial: RESULT SUMMARY: 1 points NIH Stroke Scale   INPUTS: 1A: Level of consciousness -> 0 = Alert; keenly responsive 1B: Ask month and age -> 1 = 1 question right 1C: 'Blink eyes' & 'squeeze hands' -> 0 = Performs both tasks 2: Horizontal extraocular movements -> 0 = Normal 3: Visual fields -> 0 = No visual loss 4: Facial palsy -> 0 = Normal symmetry 5A: Left arm motor drift -> 0 = No drift for 10 seconds 5B: Right arm motor drift -> 0 = No drift for 10 seconds 6A: Left leg motor drift -> 0 = No drift for 5 seconds 6B: Right leg motor drift -> 0 = No drift for 5 seconds 7: Limb Ataxia -> 0 = No ataxia 8: Sensation -> 0 = Normal; no sensory loss 9: Language/aphasia -> 0 = Normal; no aphasia 10: Dysarthria -> 0 = Normal 11: Extinction/inattention -> 0 = No abnormality  Follow up: RESULT SUMMARY: 7 points NIH Stroke Scale   INPUTS: 1A: Level of consciousness -> 0 = Alert; keenly responsive 1B: Ask month and age -> 2 = 0 questions right  1C: 'Blink eyes' & 'squeeze hands' -> 0 = Performs both tasks 2: Horizontal extraocular movements -> 0 = Normal 3: Visual fields -> 0 = No visual loss 4: Facial palsy -> 0 = Normal symmetry 5A: Left arm motor drift -> 0 = No drift for 10 seconds 5B: Right arm motor drift -> 1 = Drift, but doesn't hit bed 6A: Left leg motor drift -> 0 = No drift for 5 seconds 6B: Right leg motor drift -> 0 = No drift for 5 seconds 7: Limb Ataxia -> 0 = No ataxia 8: Sensation -> 0 = Normal; no sensory loss 9: Language/aphasia -> 2 = Severe aphasia: fragmentary expression, inference needed, cannot identify materials 10: Dysarthria -> 2 = Severe dysarthria: unintelligible slurring or out of proportion to dysphasia 11: Extinction/inattention -> 0 = No abnormality     Medical Decision Making:  - Extensive number of diagnosis or management options are considered above.   - Extensive amount of complex data reviewed.   - High risk of  complication and/or morbidity or mortality are associated with differential diagnostic considerations above.  - There may be Uncertain outcome and increased probability of prolonged functional impairment or high probability of severe prolonged functional impairment associated with some of these differential diagnosis.  Medical Data Reviewed:  1.Data reviewed include clinical labs, radiology,  Medical Tests;   2.Tests results discussed w/performing or interpreting physician;   3.Obtaining/reviewing old medical records;  4.Obtaining case history from another source;  5.Independent review of image, tracing or specimen.    Patient was informed the Neurology Consult would happen via telehealth (remote video) and consented to receiving care in this manner.

## 2017-11-19 NOTE — ED Notes (Signed)
CODE STROKE CALLED TO [phone] 

## 2017-11-19 NOTE — Progress Notes (Signed)
eLink Physician-Brief Progress Note Patient Name: Peter FretRalph Romanello DOB: 10-14-1929 MRN: [id number]   Date of Service  11/19/2017  HPI/Events of Note  83 yo male [facility] due to acute CVA symptoms, s/p TPA.   eICU Interventions  Pt currently hemodynamically stable, on RA, no distress on camera check. Continue to monitor.         Shane Crutchradeep Nnaemeka Samson 11/19/2017, 9:52 PM

## 2017-11-19 NOTE — ED Notes (Signed)
EMTALA and Medical Necessity documentation reviewed at this time and noted to be complete per policy. 

## 2017-11-19 NOTE — ED Notes (Signed)
[facility]  [facility]ED  FOR  TRANSFER  [facility]

## 2017-11-19 NOTE — ED Notes (Signed)
Dr York Ceriseforbach notified of change in NIH

## 2017-11-19 NOTE — ED Notes (Signed)
Pt aphasic again with dr York Ceriseforbach at bedside. Code stroke button hit again for consult.

## 2017-11-19 NOTE — ED Notes (Signed)
ED Provider at bedside. 

## 2017-11-19 NOTE — ED Notes (Signed)
Patient urinated in urinal 

## 2017-11-19 NOTE — ED Provider Notes (Signed)
[facility] Emergency Department Provider Note  ____________________________________________   First MD Initiated Contact with Patient 11/19/17 1637     (approximate)  I have reviewed the triage vital signs and the nursing notes.   HISTORY  Chief Complaint Aphasia and Dizziness  Level 5 caveat:  history/ROS limited by acute/critical illness  HPI Peter Becker is a 83 y.o. male with medical history as listed below who presents for evaluation of acute onset loss of ability to speak as well as acute onset dizziness.  The symptoms occurred at approximately 2:30 PM according to his wife.  By the time that he arrived in the emergency department his symptoms had resolved, but he was made a code stroke in triage.  I saw him shortly after he returned from the CT scan.  See hospital course for additional details.  Past Medical History:  Diagnosis Date  . Diabetes mellitus without complication (HCC)   . Hypertension   . Myocardial infarct [facility](HCC)     Patient Active Problem List   Diagnosis Date Noted  . AAA (abdominal aortic aneurysm) without rupture (HCC) 01/20/2017  . PAD (peripheral artery disease) (HCC) 01/20/2017  . CAD (coronary artery disease) 01/20/2017  . Essential hypertension 01/20/2017  . Hyperlipidemia 01/20/2017  . Chronic venous insufficiency 01/20/2017  . Venous stasis dermatitis 01/20/2017    Past Surgical History:  Procedure Laterality Date  . CARPAL TUNNEL RELEASE Left   . CHOLECYSTECTOMY    . EYE SURGERY    . heart stent     triple bypass  . HEMORRHOID SURGERY    . TONSILLECTOMY      Prior to Admission medications   Medication Sig Start Date End Date Taking? Authorizing Provider  Calcium Carbonate-Vit D-Min (CALCIUM 600+D3 PLUS MINERALS PO) Take 1 tablet by mouth daily.   Yes [provider]  Cholecalciferol (VITAMIN D3) 2000 units capsule Take 2,000 Units by mouth daily.    Yes [provider]  cyanocobalamin  (,VITAMIN B-12,) 1000 MCG/ML injection INJECT 1 ML IN THE MUSCLE EVERY 14 DAYS 09/02/16  Yes [provider]  fluticasone (FLONASE) 50 MCG/ACT nasal spray Place 2 sprays into the nose 2 (two) times daily.    Yes [provider]  glyBURIDE-metformin (GLUCOVANCE) 2.5-500 MG tablet Take 2 tablets by mouth 2 (two) times daily with a meal.  11/18/16  Yes [provider]  losartan (COZAAR) 50 MG tablet Take 1 tablet (50mg ) by mouth every day 01/19/16  Yes [provider]  lovastatin (MEVACOR) 20 MG tablet Take 20 mg by mouth every evening.  12/20/16  Yes [provider]  metoprolol tartrate (LOPRESSOR) 25 MG tablet Take 12.5 mg by mouth 2 (two) times daily.  09/07/15  Yes [provider]  Multiple Vitamin (MULTI-VITAMINS) TABS Take 1 tablet by mouth daily.    Yes [provider]  Multiple Vitamins-Minerals (OCUVITE EXTRA PO) Take 1 tablet by mouth daily.    Yes [provider]  Omega-3 Fatty Acids (FISH OIL PO) Take 1 capsule by mouth daily.    Yes [provider]  vitamin E 400 UNIT capsule Take 400 Units by mouth daily.    Yes [provider]  chlorpheniramine (CHLOR-TRIMETON) 4 MG tablet Take 4 mg by mouth every 6 (six) hours as needed for allergies.     [provider]  traMADol (ULTRAM) 50 MG tablet Take 1 tablet (50 mg total) by mouth every 6 (six) hours as needed for moderate pain. Patient not taking: Reported  on 11/19/2017 01/25/17   Tommi Rumps, PA-C    Allergies Patient has no known allergies.  Family History  Problem Relation Age of Onset  . Heart attack Father     Social History Social History   Tobacco Use  . Smoking status: Former Games developer  . Smokeless tobacco: Never Used  Substance Use Topics  . Alcohol use: No  . Drug use: No    Review of Systems Level 5 caveat:  history/ROS limited by acute/critical illness, the patient denies chest pain, shortness of breath, recent illness,  recent surgery, headache, and abdominal pain. ____________________________________________   PHYSICAL EXAM:  VITAL SIGNS: ED Triage Vitals  Enc Vitals Group     BP 11/19/17 1623 (!) 168/65     Pulse Rate 11/19/17 1623 68     Resp 11/19/17 1623 18     Temp 11/19/17 1623 97.9 F (36.6 C)     Temp Source 11/19/17 1623 Oral     SpO2 11/19/17 1623 97 %     Weight 11/19/17 1624 56.2 kg (124 lb)     Height 11/19/17 1624 1.702 m (5\' 7" )     Head Circumference --      Peak Flow --      Pain Score --      Pain Loc --      Pain Edu? --      Excl. in GC? --     Constitutional: Alert and oriented. Well appearing and in no acute distress. Eyes: Conjunctivae are normal. PERRL. EOMI. Head: Atraumatic. Nose: No congestion/rhinnorhea. Mouth/Throat: Mucous membranes are moist. Neck: No stridor.  No meningeal signs.   Cardiovascular: Normal rate, regular rhythm. Good peripheral circulation. Grossly normal heart sounds. Respiratory: Normal respiratory effort.  No retractions. Lungs CTAB. Gastrointestinal: Soft and nontender. No distention.  Musculoskeletal: No lower extremity tenderness nor edema. No gross deformities of extremities. Neurologic:  Normal speech and language. No gross focal neurologic deficits are appreciated.  (This was my initial assessment but see hospital course for details.) Skin:  Skin is warm, dry and intact. No rash noted. Psychiatric: Mood and affect are normal. Speech and behavior are normal.  ____________________________________________   LABS (all labs ordered are listed, but only abnormal results are displayed)  Labs Reviewed  CBC - Abnormal; Notable for the following components:      Result Value   RBC 3.78 (*)    Hemoglobin 11.7 (*)    HCT 35.6 (*)    All other components within normal limits  DIFFERENTIAL - Abnormal; Notable for the following components:   Lymphs Abs 0.9 (*)    All other components within normal limits  COMPREHENSIVE METABOLIC PANEL -  Abnormal; Notable for the following components:   Glucose, Bld 195 (*)    All other components within normal limits  TROPONIN I - Abnormal; Notable for the following components:   Troponin I 0.05 (*)    All other components within normal limits  GLUCOSE, CAPILLARY - Abnormal; Notable for the following components:   Glucose-Capillary 204 (*)    All other components within normal limits  PROTIME-INR  APTT  CBG MONITORING, ED   ____________________________________________  EKG  ED ECG REPORT I, Loleta Rose, the attending physician, personally viewed and interpreted this ECG.  Date: 11/19/2017 EKG Time: 16: 48 Rate: 67 Rhythm: normal sinus rhythm QRS Axis: Left axis deviation Intervals: normal ST/T Wave abnormalities: Non-specific ST segment / T-wave changes, but no evidence of acute ischemia. Narrative Interpretation: no evidence of acute  ischemia   ____________________________________________  RADIOLOGY   Ct Angio Head W Or Wo Contrast  Result Date: 11/19/2017 CLINICAL DATA:  Acute onset dizziness and aphasia at 1400 hours. RIGHT-sided weakness. Assess stroke. EXAM: CT ANGIOGRAPHY HEAD AND NECK CT PERFUSION BRAIN TECHNIQUE: Multidetector CT imaging of the head and neck was performed using the standard protocol during bolus administration of intravenous contrast. Multiplanar CT image reconstructions and MIPs were obtained to evaluate the vascular anatomy. Carotid stenosis measurements (when applicable) are obtained utilizing NASCET criteria, using the distal internal carotid diameter as the denominator. Multiphase CT imaging of the brain was performed following IV bolus contrast injection. Subsequent parametric perfusion maps were calculated using RAPID software. CONTRAST:  ISOVUE-370 IOPAMIDOL (ISOVUE-370) INJECTION 76% COMPARISON:  CT HEAD November 19, 2017 at 1627 hours FINDINGS: CT HEAD FINDINGS BRAIN: No intraparenchymal hemorrhage, mass effect nor midline shift. The  ventricles and sulci are normal for age. Patchy supratentorial white matter hypodensities within normal range for patient's age, though non-specific are most compatible with chronic small vessel ischemic disease. Punctate LEFT frontal lobe calcification. Old RIGHT basal ganglia lacunar infarct. No acute large vascular territory infarcts. No abnormal extra-axial fluid collections. Basal cisterns are patent. VASCULAR: Moderate calcific atherosclerosis of the carotid siphons in RIGHT vertebral artery. SKULL: No skull fracture. No significant scalp soft tissue swelling. SINUSES/ORBITS: Mild lobulated paranasal sinus mucosal thickening without air-fluid levels. Mastoid air cells are well aerated.The included ocular globes and orbital contents are non-suspicious. Status post bilateral ocular lens implants. OTHER: None. CTA NECK AORTIC ARCH: Normal appearance of the thoracic arch, normal branch pattern. Moderate calcific atherosclerosis aortic arch. The origins of the innominate, left Common carotid artery and subclavian artery are widely patent. RIGHT CAROTID SYSTEM: Common carotid artery is widely patent, mild calcific atherosclerosis. Moderate calcific atherosclerosis carotid bifurcation without hemodynamically significant stenosis by NASCET criteria. Normal appearance of the internal carotid artery. LEFT CAROTID SYSTEM: Common carotid artery is widely patent, mild calcific atherosclerosis. Moderate calcific atherosclerosis without hemodynamically significant stenosis by NASCET criteria. Focal luminal irregularity LEFT internal carotid artery origin, favoring sequelae of atherosclerosis, less likely pseudo aneurysm. VERTEBRAL ARTERIES:RIGHT vertebral artery is dominant. Calcific atherosclerosis resulting in severe stenosis RIGHT vertebral artery. The severe stenosis versus occluded LEFT P1 origin with immediate reconstitution, at diminutive thready vessel. SKELETON: No acute osseous process though bone windows have not  been submitted. Patient is edentulous. Multilevel moderate to severe RIGHT cervical facet arthropathy. OTHER NECK: Soft tissues of the neck are nonacute though, not tailored for evaluation. UPPER CHEST: Tiny scattered centrilobular ground-glass nodules most compatible with respiratory bronchiolitis. Centrilobular emphysema. Multiple LEFT upper lobe pulmonary nodules including 10 mm spiculated nodule (series 15, image 221/280). LEFT upper lobe calcified granuloma. No superior mediastinal lymphadenopathy. OTHER: None. CTA HEAD ANTERIOR CIRCULATION: Patent cervical internal carotid arteries, petrous, cavernous and supra clinoid internal carotid arteries. Atherosclerosis resulting in mild stenosis RIGHT supraclinoid internal carotid artery. Patent anterior communicating artery. Patent anterior and middle cerebral arteries. No large vessel occlusion, significant stenosis, contrast extravasation or aneurysm. POSTERIOR CIRCULATION: Patent RIGHT vertebral artery, vertebrobasilar junction and basilar artery, as well as main branch vessels. Extremely diminutive LEFT vertebral artery, with tandem severe stenosis versus occlusion and reconstitution. Bilateral posterior inferior cerebellar arteries are patent. Small LEFT P1 segment, robust LEFT posterior communicating artery. Patent posterior cerebral arteries. No large vessel occlusion, significant stenosis, contrast extravasation or aneurysm. VENOUS SINUSES: Major dural venous sinuses are patent though not tailored for evaluation on this angiographic examination. ANATOMIC VARIANTS: None. DELAYED PHASE: Not  performed. MIP images reviewed. CT Brain Perfusion Findings: CBF (<30%) Volume: 0mL Perfusion (Tmax>6.0s) volume: 9mL Mismatch Volume: 9mL Infarction Location:LEFT posterior parietal lobe. Generalized prolonged T-max LEFT MCA and posterior watershed territory with preserved flow, potentially from ICA stenosis though, preserved LEFT ACA profusion. IMPRESSION: CT HEAD: 1. No  acute intracranial process. 2. Old RIGHT basal ganglia lacunar infarct, otherwise negative noncontrast CT HEAD for age. CTA NECK: 1. Atherosclerosis without hemodynamically significant stenosis. 2. Severe stenosis versus occluded LEFT vertebral artery origin with immediate reconstitution, diminutive LEFT vertebral artery. 3. **An incidental finding of potential clinical significance has been found. Multiple LEFT upper lobe pulmonary nodules including 10 mm spiculated nodule. Recommend contrast-enhanced CT chest on a nonemergent basis to assess for extent of involvement. ** CTA HEAD: 1. No emergent large vessel occlusion or severe stenosis anterior circulation. 2. Severe stenosis versus tandem occlusion diminutive LEFT vertebral artery. RIGHT vertebral artery is dominant. CT PERFUSION: 1. Small perfusion defect LEFT parietal lobe/posterior watershed territory suggesting brain at risk. Acute findings discussed with and reconfirmed by Dr.Lindzen, Neurology on 11/19/2017 at 6:35 pm. Aortic Atherosclerosis (ICD10-I70.0) and Emphysema (ICD10-J43.9). Electronically Signed   By: Awilda Metro M.D.   On: 11/19/2017 18:36   Ct Angio Neck W Or Wo Contrast  Result Date: 11/19/2017 CLINICAL DATA:  Acute onset dizziness and aphasia at 1400 hours. RIGHT-sided weakness. Assess stroke. EXAM: CT ANGIOGRAPHY HEAD AND NECK CT PERFUSION BRAIN TECHNIQUE: Multidetector CT imaging of the head and neck was performed using the standard protocol during bolus administration of intravenous contrast. Multiplanar CT image reconstructions and MIPs were obtained to evaluate the vascular anatomy. Carotid stenosis measurements (when applicable) are obtained utilizing NASCET criteria, using the distal internal carotid diameter as the denominator. Multiphase CT imaging of the brain was performed following IV bolus contrast injection. Subsequent parametric perfusion maps were calculated using RAPID software. CONTRAST:  ISOVUE-370  IOPAMIDOL (ISOVUE-370) INJECTION 76% COMPARISON:  CT HEAD November 19, 2017 at 1627 hours FINDINGS: CT HEAD FINDINGS BRAIN: No intraparenchymal hemorrhage, mass effect nor midline shift. The ventricles and sulci are normal for age. Patchy supratentorial white matter hypodensities within normal range for patient's age, though non-specific are most compatible with chronic small vessel ischemic disease. Punctate LEFT frontal lobe calcification. Old RIGHT basal ganglia lacunar infarct. No acute large vascular territory infarcts. No abnormal extra-axial fluid collections. Basal cisterns are patent. VASCULAR: Moderate calcific atherosclerosis of the carotid siphons in RIGHT vertebral artery. SKULL: No skull fracture. No significant scalp soft tissue swelling. SINUSES/ORBITS: Mild lobulated paranasal sinus mucosal thickening without air-fluid levels. Mastoid air cells are well aerated.The included ocular globes and orbital contents are non-suspicious. Status post bilateral ocular lens implants. OTHER: None. CTA NECK AORTIC ARCH: Normal appearance of the thoracic arch, normal branch pattern. Moderate calcific atherosclerosis aortic arch. The origins of the innominate, left Common carotid artery and subclavian artery are widely patent. RIGHT CAROTID SYSTEM: Common carotid artery is widely patent, mild calcific atherosclerosis. Moderate calcific atherosclerosis carotid bifurcation without hemodynamically significant stenosis by NASCET criteria. Normal appearance of the internal carotid artery. LEFT CAROTID SYSTEM: Common carotid artery is widely patent, mild calcific atherosclerosis. Moderate calcific atherosclerosis without hemodynamically significant stenosis by NASCET criteria. Focal luminal irregularity LEFT internal carotid artery origin, favoring sequelae of atherosclerosis, less likely pseudo aneurysm. VERTEBRAL ARTERIES:RIGHT vertebral artery is dominant. Calcific atherosclerosis resulting in severe stenosis RIGHT  vertebral artery. The severe stenosis versus occluded LEFT P1 origin with immediate reconstitution, at diminutive thready vessel. SKELETON: No acute osseous process though bone  windows have not been submitted. Patient is edentulous. Multilevel moderate to severe RIGHT cervical facet arthropathy. OTHER NECK: Soft tissues of the neck are nonacute though, not tailored for evaluation. UPPER CHEST: Tiny scattered centrilobular ground-glass nodules most compatible with respiratory bronchiolitis. Centrilobular emphysema. Multiple LEFT upper lobe pulmonary nodules including 10 mm spiculated nodule (series 15, image 221/280). LEFT upper lobe calcified granuloma. No superior mediastinal lymphadenopathy. OTHER: None. CTA HEAD ANTERIOR CIRCULATION: Patent cervical internal carotid arteries, petrous, cavernous and supra clinoid internal carotid arteries. Atherosclerosis resulting in mild stenosis RIGHT supraclinoid internal carotid artery. Patent anterior communicating artery. Patent anterior and middle cerebral arteries. No large vessel occlusion, significant stenosis, contrast extravasation or aneurysm. POSTERIOR CIRCULATION: Patent RIGHT vertebral artery, vertebrobasilar junction and basilar artery, as well as main branch vessels. Extremely diminutive LEFT vertebral artery, with tandem severe stenosis versus occlusion and reconstitution. Bilateral posterior inferior cerebellar arteries are patent. Small LEFT P1 segment, robust LEFT posterior communicating artery. Patent posterior cerebral arteries. No large vessel occlusion, significant stenosis, contrast extravasation or aneurysm. VENOUS SINUSES: Major dural venous sinuses are patent though not tailored for evaluation on this angiographic examination. ANATOMIC VARIANTS: None. DELAYED PHASE: Not performed. MIP images reviewed. CT Brain Perfusion Findings: CBF (<30%) Volume: 0mL Perfusion (Tmax>6.0s) volume: 9mL Mismatch Volume: 9mL Infarction Location:LEFT posterior parietal  lobe. Generalized prolonged T-max LEFT MCA and posterior watershed territory with preserved flow, potentially from ICA stenosis though, preserved LEFT ACA profusion. IMPRESSION: CT HEAD: 1. No acute intracranial process. 2. Old RIGHT basal ganglia lacunar infarct, otherwise negative noncontrast CT HEAD for age. CTA NECK: 1. Atherosclerosis without hemodynamically significant stenosis. 2. Severe stenosis versus occluded LEFT vertebral artery origin with immediate reconstitution, diminutive LEFT vertebral artery. 3. **An incidental finding of potential clinical significance has been found. Multiple LEFT upper lobe pulmonary nodules including 10 mm spiculated nodule. Recommend contrast-enhanced CT chest on a nonemergent basis to assess for extent of involvement. ** CTA HEAD: 1. No emergent large vessel occlusion or severe stenosis anterior circulation. 2. Severe stenosis versus tandem occlusion diminutive LEFT vertebral artery. RIGHT vertebral artery is dominant. CT PERFUSION: 1. Small perfusion defect LEFT parietal lobe/posterior watershed territory suggesting brain at risk. Acute findings discussed with and reconfirmed by Dr.Lindzen, Neurology on 11/19/2017 at 6:35 pm. Aortic Atherosclerosis (ICD10-I70.0) and Emphysema (ICD10-J43.9). Electronically Signed   By: Awilda Metro M.D.   On: 11/19/2017 18:36   Ct Cerebral Perfusion W Contrast  Result Date: 11/19/2017 CLINICAL DATA:  Acute onset dizziness and aphasia at 1400 hours. RIGHT-sided weakness. Assess stroke. EXAM: CT ANGIOGRAPHY HEAD AND NECK CT PERFUSION BRAIN TECHNIQUE: Multidetector CT imaging of the head and neck was performed using the standard protocol during bolus administration of intravenous contrast. Multiplanar CT image reconstructions and MIPs were obtained to evaluate the vascular anatomy. Carotid stenosis measurements (when applicable) are obtained utilizing NASCET criteria, using the distal internal carotid diameter as the denominator.  Multiphase CT imaging of the brain was performed following IV bolus contrast injection. Subsequent parametric perfusion maps were calculated using RAPID software. CONTRAST:  ISOVUE-370 IOPAMIDOL (ISOVUE-370) INJECTION 76% COMPARISON:  CT HEAD November 19, 2017 at 1627 hours FINDINGS: CT HEAD FINDINGS BRAIN: No intraparenchymal hemorrhage, mass effect nor midline shift. The ventricles and sulci are normal for age. Patchy supratentorial white matter hypodensities within normal range for patient's age, though non-specific are most compatible with chronic small vessel ischemic disease. Punctate LEFT frontal lobe calcification. Old RIGHT basal ganglia lacunar infarct. No acute large vascular territory infarcts. No abnormal extra-axial fluid collections.  Basal cisterns are patent. VASCULAR: Moderate calcific atherosclerosis of the carotid siphons in RIGHT vertebral artery. SKULL: No skull fracture. No significant scalp soft tissue swelling. SINUSES/ORBITS: Mild lobulated paranasal sinus mucosal thickening without air-fluid levels. Mastoid air cells are well aerated.The included ocular globes and orbital contents are non-suspicious. Status post bilateral ocular lens implants. OTHER: None. CTA NECK AORTIC ARCH: Normal appearance of the thoracic arch, normal branch pattern. Moderate calcific atherosclerosis aortic arch. The origins of the innominate, left Common carotid artery and subclavian artery are widely patent. RIGHT CAROTID SYSTEM: Common carotid artery is widely patent, mild calcific atherosclerosis. Moderate calcific atherosclerosis carotid bifurcation without hemodynamically significant stenosis by NASCET criteria. Normal appearance of the internal carotid artery. LEFT CAROTID SYSTEM: Common carotid artery is widely patent, mild calcific atherosclerosis. Moderate calcific atherosclerosis without hemodynamically significant stenosis by NASCET criteria. Focal luminal irregularity LEFT internal carotid artery  origin, favoring sequelae of atherosclerosis, less likely pseudo aneurysm. VERTEBRAL ARTERIES:RIGHT vertebral artery is dominant. Calcific atherosclerosis resulting in severe stenosis RIGHT vertebral artery. The severe stenosis versus occluded LEFT P1 origin with immediate reconstitution, at diminutive thready vessel. SKELETON: No acute osseous process though bone windows have not been submitted. Patient is edentulous. Multilevel moderate to severe RIGHT cervical facet arthropathy. OTHER NECK: Soft tissues of the neck are nonacute though, not tailored for evaluation. UPPER CHEST: Tiny scattered centrilobular ground-glass nodules most compatible with respiratory bronchiolitis. Centrilobular emphysema. Multiple LEFT upper lobe pulmonary nodules including 10 mm spiculated nodule (series 15, image 221/280). LEFT upper lobe calcified granuloma. No superior mediastinal lymphadenopathy. OTHER: None. CTA HEAD ANTERIOR CIRCULATION: Patent cervical internal carotid arteries, petrous, cavernous and supra clinoid internal carotid arteries. Atherosclerosis resulting in mild stenosis RIGHT supraclinoid internal carotid artery. Patent anterior communicating artery. Patent anterior and middle cerebral arteries. No large vessel occlusion, significant stenosis, contrast extravasation or aneurysm. POSTERIOR CIRCULATION: Patent RIGHT vertebral artery, vertebrobasilar junction and basilar artery, as well as main branch vessels. Extremely diminutive LEFT vertebral artery, with tandem severe stenosis versus occlusion and reconstitution. Bilateral posterior inferior cerebellar arteries are patent. Small LEFT P1 segment, robust LEFT posterior communicating artery. Patent posterior cerebral arteries. No large vessel occlusion, significant stenosis, contrast extravasation or aneurysm. VENOUS SINUSES: Major dural venous sinuses are patent though not tailored for evaluation on this angiographic examination. ANATOMIC VARIANTS: None. DELAYED  PHASE: Not performed. MIP images reviewed. CT Brain Perfusion Findings: CBF (<30%) Volume: 0mL Perfusion (Tmax>6.0s) volume: 9mL Mismatch Volume: 9mL Infarction Location:LEFT posterior parietal lobe. Generalized prolonged T-max LEFT MCA and posterior watershed territory with preserved flow, potentially from ICA stenosis though, preserved LEFT ACA profusion. IMPRESSION: CT HEAD: 1. No acute intracranial process. 2. Old RIGHT basal ganglia lacunar infarct, otherwise negative noncontrast CT HEAD for age. CTA NECK: 1. Atherosclerosis without hemodynamically significant stenosis. 2. Severe stenosis versus occluded LEFT vertebral artery origin with immediate reconstitution, diminutive LEFT vertebral artery. 3. **An incidental finding of potential clinical significance has been found. Multiple LEFT upper lobe pulmonary nodules including 10 mm spiculated nodule. Recommend contrast-enhanced CT chest on a nonemergent basis to assess for extent of involvement. ** CTA HEAD: 1. No emergent large vessel occlusion or severe stenosis anterior circulation. 2. Severe stenosis versus tandem occlusion diminutive LEFT vertebral artery. RIGHT vertebral artery is dominant. CT PERFUSION: 1. Small perfusion defect LEFT parietal lobe/posterior watershed territory suggesting brain at risk. Acute findings discussed with and reconfirmed by Dr.Lindzen, Neurology on 11/19/2017 at 6:35 pm. Aortic Atherosclerosis (ICD10-I70.0) and Emphysema (ICD10-J43.9). Electronically Signed   By: Michel Santee.D.  On: 11/19/2017 18:36   Ct Head Code Stroke Wo Contrast  Result Date: 11/19/2017 CLINICAL DATA:  Code stroke.  Sudden onset dizziness and aphasia. EXAM: CT HEAD WITHOUT CONTRAST TECHNIQUE: Contiguous axial images were obtained from the base of the skull through the vertex without intravenous contrast. COMPARISON:  None FINDINGS: Brain: A 2 mm punctate density in the left frontal white matter is favored to reflect a calcification. No definite  intracranial hemorrhage is identified. There is a chronic lacunar infarct in the right lentiform nucleus. No acute cortically based infarct, mass, midline shift, or extra-axial fluid collection is identified. Periventricular white matter hypodensities are nonspecific but compatible with mild chronic small vessel ischemic disease. Generalized cerebral atrophy is relatively mild for age. Vascular: Calcified atherosclerosis at the skullbase. No hyperdense vessel. Skull: No fracture or focal osseous lesion. Sinuses/Orbits: Mild bilateral ethmoid air cell mucosal thickening. Clear mastoid air cells. No acute findings in the included orbits. Other: None. ASPECTS [facility]([place] Stroke Program Early CT Score) - Ganglionic level infarction (caudate, lentiform nuclei, internal capsule, insula, M1-M3 cortex): 7 - Supraganglionic infarction (M4-M6 cortex): 3 Total score (0-10 with 10 being normal): 10 IMPRESSION: 1. No evidence of acute intracranial abnormality. 2. ASPECTS is 10. 3. Mild chronic small vessel ischemic disease and cerebral atrophy. Chronic right basal ganglia lacunar infarct. These results were called by telephone at the time of interpretation on 11/19/2017 at 4:40 pm to Dr. Sharman CheekPHILLIP STAFFORD , who verbally acknowledged these results. Electronically Signed   By: Sebastian AcheAllen  Grady M.D.   On: 11/19/2017 16:40    ____________________________________________   PROCEDURES  Critical Care performed: Yes, see critical care procedure note(s)   Procedure(s) performed:   .Critical Care Performed by: Loleta RoseForbach, Bram Hottel, MD Authorized by: Loleta RoseForbach, Diamonique Ruedas, MD   Critical care provider statement:    Critical care time (minutes):  75   Critical care time was exclusive of:  Separately billable procedures and treating other patients   Critical care was necessary to treat or prevent imminent or life-threatening deterioration of the following conditions:  CNS failure or compromise   Critical care was time spent personally by me on  the following activities:  Development of treatment plan with patient or surrogate, discussions with consultants, evaluation of patient's response to treatment, examination of patient, obtaining history from patient or surrogate, ordering and performing treatments and interventions, ordering and review of laboratory studies, ordering and review of radiographic studies, pulse oximetry, re-evaluation of patient's condition and review of old charts       ____________________________________________   INITIAL IMPRESSION / ASSESSMENT AND PLAN / ED COURSE  As part of my medical decision making, I reviewed the following data within the electronic MEDICAL RECORD NUMBER History obtained from family, Nursing notes reviewed and incorporated, Labs reviewed , EKG interpreted , Old chart reviewed, Discussed with admitting physician  and A consult was requested and obtained from this/these consultant(s) Neurology    NIH Stroke Scale  Interval: Baseline Time: 17:10 Person Administering Scale: Loleta RoseCory Ander Wamser  Administer stroke scale items in the order listed. Record performance in each category after each subscale exam. Do not go back and change scores. Follow directions provided for each exam technique. Scores should reflect what the patient does, not what the clinician thinks the patient can do. The clinician should record answers while administering the exam and work quickly. Except where indicated, the patient should not be coached (i.e., repeated requests to patient to make a special effort).   1a  Level of consciousness: 0=alert; keenly  responsive  1b. LOC questions:  0=Performs both tasks correctly  1c. LOC commands: 0=Performs both tasks correctly  2.  Best Gaze: 0=normal  3.  Visual: 0=No visual loss  4. Facial Palsy: 0=Normal symmetric movement  5a.  Motor left arm: 0=No drift, limb holds 90 (or 45) degrees for full 10 seconds  5b.  Motor right arm: 0=No drift, limb holds 90 (or 45) degrees for  full 10 seconds  6a. motor left leg: 0=No drift, limb holds 90 (or 45) degrees for full 10 seconds  6b  Motor right leg:  0=No drift, limb holds 90 (or 45) degrees for full 10 seconds  7. Limb Ataxia: 0=Absent  8.  Sensory: 0=Normal; no sensory loss  9. Best Language:  0=No aphasia, normal  10. Dysarthria: 0=Normal  11. Extinction and Inattention: 0=No abnormality  12. Distal motor function: 0=Normal   Total:   0   **AFTER ACUTE CHANGE DURING MY EVALUATION** NIH Stroke Scale  Interval: Baseline Time: 17:15 Person Administering Scale: Loleta Rose  Administer stroke scale items in the order listed. Record performance in each category after each subscale exam. Do not go back and change scores. Follow directions provided for each exam technique. Scores should reflect what the patient does, not what the clinician thinks the patient can do. The clinician should record answers while administering the exam and work quickly. Except where indicated, the patient should not be coached (i.e., repeated requests to patient to make a special effort).   1a  Level of consciousness: 0=alert; keenly responsive  1b. LOC questions:  0=Performs both tasks correctly  1c. LOC commands: 0=Performs both tasks correctly  2.  Best Gaze: 0=normal  3.  Visual: 0=No visual loss  4. Facial Palsy: 1=Minor paralysis (flattened nasolabial fold, asymmetric on smiling)  5a.  Motor left arm: 0=No drift, limb holds 90 (or 45) degrees for full 10 seconds  5b.  Motor right arm: 2=Some effort against gravity, limb cannot get to or maintain (if cured) 90 (or 45) degrees, drifts down to bed, but has some effort against gravity  6a. motor left leg: 0=No drift, limb holds 90 (or 45) degrees for full 10 seconds  6b  Motor right leg:  0=No drift, limb holds 90 (or 45) degrees for full 10 seconds  7. Limb Ataxia: 0=Absent  8.  Sensory: 0=Normal; no sensory loss  9. Best Language:  2  10. Dysarthria: 2=Severe; patient speech is so  slurred as to be unintelligible in the absence of or our of proportion to any dysphagia, or is mute/anarthric  11. Extinction and Inattention: 0=No abnormality  12. Distal motor function: 0=Normal   Total:   7    NIH Stroke Scale Interval: post-tPA Time: 18:35 Person Administering Scale: Loleta Rose  Administer stroke scale items in the order listed. Record performance in each category after each subscale exam. Do not go back and change scores. Follow directions provided for each exam technique. Scores should reflect what the patient does, not what the clinician thinks the patient can do. The clinician should record answers while administering the exam and work quickly. Except where indicated, the patient should not be coached (i.e., repeated requests to patient to make a special effort).   1a  Level of consciousness: 0=alert; keenly responsive  1b. LOC questions:  0=Performs both tasks correctly  1c. LOC commands: 0=Performs both tasks correctly  2.  Best Gaze: 0=normal  3.  Visual: 0=No visual loss  4. Facial Palsy: 0=Normal symmetric movement  5a.  Motor left arm: 0=No drift, limb holds 90 (or 45) degrees for full 10 seconds  5b.  Motor right arm: 0=No drift, limb holds 90 (or 45) degrees for full 10 seconds  6a. motor left leg: 0=No drift, limb holds 90 (or 45) degrees for full 10 seconds  6b  Motor right leg:  0=No drift, limb holds 90 (or 45) degrees for full 10 seconds  7. Limb Ataxia: 0=Absent  8.  Sensory: 0=Normal; no sensory loss  9. Best Language:  0=No aphasia, normal  10. Dysarthria: 1=Mild to moderate, patient slurs at least some words and at worst, can be understood with some difficulty  11. Extinction and Inattention: 0=No abnormality  12. Distal motor function: 0=Normal   Total:   1      Clinical Course as of Nov 19 2104  Wed Nov 19, 2017  1649 Neurology [facility] consult going on now  [CF]  1726 (slight delay in documentation due to critical illness)  I had just  spoken with Dr. Suzie Portela (neuro [facility]) about completely resolved stroke symptoms.  I went to talk with the patient and his wife, and in mid-sentence, he became severely asphasic and dysarthric again.  He also lost the ability to grip with his right hand.  NIHSS is now about 7.  Re-initiated neuro SOC with Dr. Suzie Portela who ordered CT perfusion study, another w/o contrast CT, and angio head and neck.  But now he feels that the patient is a tPA candidate, and I agree.  We reviewed all the exclusion criteria, and he has no absolute contraindications.  Patient is currently in CT getting the studies which Dr. Suzie Portela ordered, but his wife has agreed to tPA.    [CF]  1738 Charge RN is mixing tPA while patient is getting studies  [CF]  1746 Discussed tPA window with neurologist, he feels it is still well-within range given that the patient's symptoms completely resolved and started again at 17:10.  Awaiting patient's return from CT.  [CF]  1747 No bleed on CTs, pushing tPA on neurologist's orders, and I agree - patient back from CT and still aphasic  [CF]  1809 Still awaiting word on LVO issue, but initiated transfer to [facility]; waiting to speak with [facility] representative  [CF]  1821 By phone with the on-call neurologist at [facility].  He was under the impression that we keep TPA patients at [facility], but I had verified with my medical director and I verified with the hospitalist that everyone's understanding is that we transfer.  The [facility] neurologist is reviewing the images now because he said that that will determine whether the patient goes ED to ED or go directly to the neuro ICU.  He is aware that the patient has already received TPA.   [CF]  1830 Spoke by phone with Dr. Otelia Limes who accepted the patient to the [facility] neuro ICU.  No LVO.  Discussed with [facility], they are awaiting bed assignment.  Reassessed patient and updated family.  He is significantly improved with an NIHSS of 1 due to dysarthria.  Hemodynamically stable.   [CF]  1832 Tried to call and speak with Dr. Karie Kirks (radiology), but she is on the phone with Dr. Otelia Limes [facility]), to whom I spoke earlier.  Awaiting definitive word regarding transfer plan.  [CF]  1914 ED RN Vikki Ports) reports that patient's symptoms have returned as before with aphasia/dysarthria and loss of grip strength in right hand.  No other changes.  Teleneurology no longer  on the monitor.  Paging neurologist at [facility] to discuss and ask for recommendations.  Doubt intracranial bleed since symptoms are the same as before, but will consider re-scan.  [CF]  1924 Re-assessed the patient.  His dysarthria and aphasia are as severe as before but he remains alert.  His grip strength is decreased from baseline but better than initially when I called the second code stroke.  I spoke by phone with Dr. Laurence Slate who has taken over at [facility] for Dr. Otelia Limes.  I discussed the case in the symptoms and he recommended getting a another CT scan to rule out bleeding if the patient's symptoms are worse than prior.  If they are about the same, he said that we can expect some waxing and waning of symptoms given that there is no large vessel occlusion or clot that would have been addressed by the TPA.  I updated the family and we are again inquiring about bed status in the neuro ICU.  [CF]  2040 [facility] arrived, and I reassessed the patient . He still has aphasia but grip strength has improved.  He is still awake and alert, follows commands.  Updated EMTALA . Patient stable for transport.  [CF]    Clinical Course User Index [CF] Loleta Rose, MD    ____________________________________________  FINAL CLINICAL IMPRESSION(S) / ED DIAGNOSES  Final diagnoses:  Acute CVA (cerebrovascular accident) (HCC)  Elevated troponin I level  Received intravenous tissue plasminogen activator (tPA) in emergency department     MEDICATIONS GIVEN DURING THIS VISIT:  Medications  alteplase (ACTIVASE) 1 mg/mL infusion 51 mg (0  mg/kg  56.2 kg Intravenous Stopped 11/19/17 1852)    Followed by  0.9 %  sodium chloride infusion (0 mLs Intravenous Stopped 11/19/17 1952)  iopamidol (ISOVUE-370) 76 % injection 100 mL (100 mLs Intravenous Contrast Given 11/19/17 1734)  labetalol (NORMODYNE,TRANDATE) injection 10 mg (10 mg Intravenous Given 11/19/17 1754)  0.9 %  sodium chloride infusion ( Intravenous Transfusing/Transfer 11/19/17 2045)     ED Discharge Orders    None       Note:  This document was prepared using Dragon voice recognition software and may include unintentional dictation errors.    Loleta Rose, MD 11/19/17 2105

## 2017-11-19 NOTE — ED Notes (Signed)
Holding tpa r/t bp.

## 2017-11-19 NOTE — ED Notes (Signed)
Pt feels symptoms have resolved and back to normal. No weakness noted.

## 2017-11-19 NOTE — ED Notes (Signed)
Date and time results received: 11/19/17 1657 (use smartphrase ".now" to insert current time)  Test: troponin Critical Value: 0.05  Name of Provider Notified: forbach

## 2017-11-20 ENCOUNTER — Inpatient Hospital Stay (HOSPITAL_COMMUNITY): Payer: Medicare Other

## 2017-11-20 ENCOUNTER — Encounter (HOSPITAL_COMMUNITY): Payer: Self-pay

## 2017-11-20 DIAGNOSIS — R4701 Aphasia: Secondary | ICD-10-CM

## 2017-11-20 DIAGNOSIS — E785 Hyperlipidemia, unspecified: Secondary | ICD-10-CM

## 2017-11-20 DIAGNOSIS — I639 Cerebral infarction, unspecified: Secondary | ICD-10-CM

## 2017-11-20 DIAGNOSIS — R569 Unspecified convulsions: Secondary | ICD-10-CM

## 2017-11-20 DIAGNOSIS — E1159 Type 2 diabetes mellitus with other circulatory complications: Secondary | ICD-10-CM

## 2017-11-20 DIAGNOSIS — I340 Nonrheumatic mitral (valve) insufficiency: Secondary | ICD-10-CM

## 2017-11-20 LAB — URINALYSIS, COMPLETE (UACMP) WITH MICROSCOPIC
Bilirubin Urine: NEGATIVE
Glucose, UA: NEGATIVE mg/dL
HGB URINE DIPSTICK: NEGATIVE
Ketones, ur: 20 mg/dL — AB
Leukocytes, UA: NEGATIVE
NITRITE: NEGATIVE
PROTEIN: 100 mg/dL — AB
SPECIFIC GRAVITY, URINE: 1.019 (ref 1.005–1.030)
Squamous Epithelial / LPF: NONE SEEN
pH: 7 (ref 5.0–8.0)

## 2017-11-20 LAB — FIBRINOGEN: FIBRINOGEN: 362 mg/dL (ref 210–475)

## 2017-11-20 LAB — CSF CELL COUNT WITH DIFFERENTIAL
EOS CSF: 0 % (ref 0–1)
EOS CSF: 0 % (ref 0–1)
LYMPHS CSF: 1 % — AB (ref 40–80)
LYMPHS CSF: 2 % — AB (ref 40–80)
MONOCYTE-MACROPHAGE-SPINAL FLUID: 4 % — AB (ref 15–45)
MONOCYTE-MACROPHAGE-SPINAL FLUID: 5 % — AB (ref 15–45)
OTHER CELLS CSF: 0
OTHER CELLS CSF: 0
RBC Count, CSF: 47 /mm3 — ABNORMAL HIGH
RBC Count, CSF: 62 /mm3 — ABNORMAL HIGH
SEGMENTED NEUTROPHILS-CSF: 93 % — AB (ref 0–6)
SEGMENTED NEUTROPHILS-CSF: 95 % — AB (ref 0–6)
TUBE #: 1
TUBE #: 4
WBC, CSF: 23 /mm3 (ref 0–5)
WBC, CSF: 33 /mm3 (ref 0–5)

## 2017-11-20 LAB — BASIC METABOLIC PANEL
ANION GAP: 13 (ref 5–15)
BUN: 11 mg/dL (ref 6–20)
CHLORIDE: 104 mmol/L (ref 101–111)
CO2: 21 mmol/L — AB (ref 22–32)
Calcium: 9.2 mg/dL (ref 8.9–10.3)
Creatinine, Ser: 1.12 mg/dL (ref 0.61–1.24)
GFR calc Af Amer: >60 mL/min (ref >60)
GFR calc non Af Amer: 57 mL/min — ABNORMAL LOW (ref >60)
GLUCOSE: 90 mg/dL (ref 65–99)
POTASSIUM: 4.3 mmol/L (ref 3.5–5.1)
Sodium: 138 mmol/L (ref 135–145)

## 2017-11-20 LAB — PROTEIN AND GLUCOSE, CSF
GLUCOSE CSF: 60 mg/dL (ref 40–70)
Total  Protein, CSF: 98 mg/dL — ABNORMAL HIGH (ref 15–45)

## 2017-11-20 LAB — LIPID PANEL
CHOLESTEROL: 129 mg/dL (ref 0–200)
HDL: 71 mg/dL (ref >40)
LDL Cholesterol: 47 mg/dL (ref 0–99)
Total CHOL/HDL Ratio: 1.8 RATIO
Triglycerides: 56 mg/dL (ref <150)
VLDL: 11 mg/dL (ref 0–40)

## 2017-11-20 LAB — CBC
HEMATOCRIT: 34.9 % — AB (ref 39.0–52.0)
Hemoglobin: 11.6 g/dL — ABNORMAL LOW (ref 13.0–17.0)
MCH: 30.6 pg (ref 26.0–34.0)
MCHC: 33.2 g/dL (ref 30.0–36.0)
MCV: 92.1 fL (ref 78.0–100.0)
PLATELETS: 232 10*3/uL (ref 150–400)
RBC: 3.79 MIL/uL — AB (ref 4.22–5.81)
RDW: 13.2 % (ref 11.5–15.5)
WBC: 8.3 10*3/uL (ref 4.0–10.5)

## 2017-11-20 LAB — RAPID URINE DRUG SCREEN, HOSP PERFORMED
AMPHETAMINES: NOT DETECTED
BENZODIAZEPINES: NOT DETECTED
Barbiturates: NOT DETECTED
COCAINE: NOT DETECTED
OPIATES: NOT DETECTED
Tetrahydrocannabinol: NOT DETECTED

## 2017-11-20 LAB — GLUCOSE, CAPILLARY
GLUCOSE-CAPILLARY: 105 mg/dL — AB (ref 65–99)
GLUCOSE-CAPILLARY: 137 mg/dL — AB (ref 65–99)
GLUCOSE-CAPILLARY: 130 mg/dL — AB (ref 65–99)
Glucose-Capillary: 104 mg/dL — ABNORMAL HIGH (ref 65–99)
Glucose-Capillary: 102 mg/dL — ABNORMAL HIGH (ref 65–99)
Glucose-Capillary: 128 mg/dL — ABNORMAL HIGH (ref 65–99)

## 2017-11-20 LAB — TSH: TSH: 2.226 u[IU]/mL (ref 0.350–4.500)

## 2017-11-20 LAB — HEMOGLOBIN A1C
Hgb A1c MFr Bld: 7.3 % — ABNORMAL HIGH (ref 4.8–5.6)
MEAN PLASMA GLUCOSE: 162.81 mg/dL

## 2017-11-20 LAB — MRSA PCR SCREENING: MRSA BY PCR: NEGATIVE

## 2017-11-20 LAB — VITAMIN B12: Vitamin B-12: 707 pg/mL (ref 180–914)

## 2017-11-20 LAB — AMMONIA: AMMONIA: 13 umol/L (ref 9–35)

## 2017-11-20 LAB — CRYPTOCOCCAL ANTIGEN, CSF: Crypto Ag: NEGATIVE

## 2017-11-20 MED ORDER — DEXTROSE 5 % IV SOLN
10.0000 mg/kg | Freq: Two times a day (BID) | INTRAVENOUS | Status: DC
  Administered 2017-11-20 – 2017-11-22 (×4): 560 mg via INTRAVENOUS
  Filled 2017-11-20 (×4): qty 11.2

## 2017-11-20 MED ORDER — LORAZEPAM 2 MG/ML IJ SOLN
2.0000 mg | Freq: Once | INTRAMUSCULAR | Status: AC
  Administered 2017-11-20: 2 mg via INTRAVENOUS
  Filled 2017-11-20: qty 1

## 2017-11-20 MED ORDER — GADOBENATE DIMEGLUMINE 529 MG/ML IV SOLN
10.0000 mL | Freq: Once | INTRAVENOUS | Status: AC | PRN
  Administered 2017-11-20: 10 mL via INTRAVENOUS

## 2017-11-20 MED ORDER — LIDOCAINE HCL (PF) 1 % IJ SOLN
INTRAMUSCULAR | Status: AC
  Administered 2017-11-20: 19:00:00
  Filled 2017-11-20: qty 30

## 2017-11-20 MED ORDER — INSULIN ASPART 100 UNIT/ML ~~LOC~~ SOLN: 0.0000 [IU] | Freq: Every day | SUBCUTANEOUS | Status: DC

## 2017-11-20 MED ORDER — ACETAMINOPHEN 650 MG RE SUPP
650.0000 mg | Freq: Four times a day (QID) | RECTAL | Status: DC | PRN
  Administered 2017-11-20 – 2017-11-21 (×4): 650 mg via RECTAL
  Filled 2017-11-20 (×4): qty 1

## 2017-11-20 MED ORDER — INSULIN ASPART 100 UNIT/ML ~~LOC~~ SOLN: 0.0000 [IU] | Freq: Three times a day (TID) | SUBCUTANEOUS | Status: DC

## 2017-11-20 MED ORDER — INSULIN ASPART 100 UNIT/ML ~~LOC~~ SOLN
0.0000 [IU] | SUBCUTANEOUS | Status: DC
  Administered 2017-11-20 – 2017-11-22 (×4): 1 [IU] via SUBCUTANEOUS
  Administered 2017-11-22 – 2017-11-23 (×5): 2 [IU] via SUBCUTANEOUS
  Administered 2017-11-23: 3 [IU] via SUBCUTANEOUS
  Administered 2017-11-24 (×3): 2 [IU] via SUBCUTANEOUS
  Administered 2017-11-24: 3 [IU] via SUBCUTANEOUS
  Administered 2017-11-24 – 2017-11-25 (×2): 2 [IU] via SUBCUTANEOUS
  Administered 2017-11-25: 1 [IU] via SUBCUTANEOUS
  Administered 2017-11-25 – 2017-11-26 (×5): 2 [IU] via SUBCUTANEOUS
  Administered 2017-11-26: 1 [IU] via SUBCUTANEOUS
  Administered 2017-11-26: 2 [IU] via SUBCUTANEOUS

## 2017-11-20 NOTE — Progress Notes (Addendum)
  Echocardiogram 2D Echocardiogram has been performed.  Patient fighting during bubble study.   [place] Behlke L Androw 11/20/2017, 11:12 AM

## 2017-11-20 NOTE — Progress Notes (Signed)
Upon arrival to the unit, pt was assessed by on-call neurologist and NIH charted by RN as an 8. Pt began to exhibit worsening RUE weakness and R sided neglect. Notified on-call neurologist of change, but informed RN that change was not significant enough for stat CT scan and to get MRI ASAP. MRI completed and was negative for stroke per radiologist. Upon receiving call from neurologist concerning MRI, MD was concerned that pt may be having seizure-like activity and ordered 2mg  of ativan IVP. RN questioned order as pt has been drowsy and receiving q1 hr neuro/NIH checks for post-tPA assessments. MD confirmed medication order and RN will complete as ordered.  Will continue to monitor closely.  Francia GreavesSavannah R Charlei Ramsaran, RN

## 2017-11-20 NOTE — Progress Notes (Signed)
STAT EEG completed; results pending. Dr Aroor and Dr Roda ShuttersXu notified.

## 2017-11-20 NOTE — Progress Notes (Signed)
CTA neck completed 12/19. Please advise if carotid duplex needed. [phone] Vascular lab

## 2017-11-20 NOTE — Care Management Note (Signed)
Case Management Note  Patient Details  Name: Peter FretRalph Becker MRN: [id number] Date of Birth: 1929/01/12  Subjective/Objective:   Pt admitted on 11/19/17 with sudden onset aphasia and AMS.  PTA, pt independent, lives with spouse.                     Action/Plan: Will follow for discharge planning as pt progresses.   Expected Discharge Date:                  Expected Discharge Plan:     In-House Referral:     Discharge planning Services  CM Consult  Post Acute Care Choice:    Choice offered to:     DME Arranged:    DME Agency:     HH Arranged:    HH Agency:     Status of Service:  In process, will continue to follow  If discussed at Long Length of Stay Meetings, dates discussed:    Additional Comments:  Quintella BatonJulie W. Linzi Ohlinger, RN, BSN  Trauma/Neuro ICU Case Manager [phone]

## 2017-11-20 NOTE — H&P (Signed)
Chief Complaint: Aphasia  History obtained from: Patient and Chart  HPI:                                                                                                                                       Peter FretRalph Becker is an 83 y.o. male with past medical history of diabetes, hypertension, peripheral arterial disease, coronary artery disease who presents to [facility] with sudden onset aphasia  2 PM today.  According to the wife the patient was fine earlier today but around 2:30 PM when they were having lunch, the patient stopped making sense while speaking for a few minutes which then improved with the time the patient came to the hospital. He was seen as a stroke alert by tele neurology, however score  NIH is 1. While in the emergency department, he became aphasic again -unable to comprehend and say words. No significant weaknesses was noted in any extremity. Patient received TPA. CTA  did not show a large vessel occlusion, CT perfusion however increased MTT in the left parietal lobe.  Patient apparently improved again after receiving tPA however symptoms became worse again shortly after. [facility].   Wife states similar symptoms occurred in 2007 after he underwent CABG for he could not speak for days. MRI was apparently negative at that time as well.   Date last known well:  12.19.18 Time last known well:  2 30 p.m. tPA Given: yes  NIHSS: 7 Baseline MRS 0    Past Medical History:  Diagnosis Date  . Diabetes mellitus without complication (HCC)   . Hypertension   . Myocardial infarct [facility](HCC)     Past Surgical History:  Procedure Laterality Date  . CARPAL TUNNEL RELEASE Left   . CHOLECYSTECTOMY    . EYE SURGERY    . heart stent     triple bypass  . HEMORRHOID SURGERY    . TONSILLECTOMY      Family History  Problem Relation Age of Onset  . Heart attack Father    Social History:  reports that he has quit  smoking. he has never used smokeless tobacco. He reports that he does not drink alcohol or use drugs.  Allergies: No Known Allergies  Medications:  I reviewed home medications   ROS:                                                                                                                                     14 systems reviewed and negative except above    Examination:                                                                                                      General: Appears well-developed and well-nourished.  Psych: Affect appropriate to situation Eyes: No scleral injection HENT: No OP obstrucion Head: Normocephalic.  Cardiovascular: Normal rate and regular rhythm.  Respiratory: Effort normal and breath sounds normal to anterior ascultation GI: Soft.  No distension. There is no tenderness.  Skin: WDI   Neurological Examination Mental Status: Alert, aphasic. Word salad, unable to follow commands, but able to mimic some actions Cranial Nerves: II: Visual fields : difficult to assess, reduced blink to threat on right side III,IV, VI: ptosis not present, extra-ocular motions intact bilaterally, pupils equal, round, reactive to light and accommodation V,VII: smile symmetric, facial light touch sensation normal bilaterally VIII: hearing normal bilaterally IX,X: uvula rises symmetrically XI: bilateral shoulder shrug XII: midline tongue extension Motor: Right : Upper extremity   5/5    Left:     Upper extremity   5/5  Lower extremity   5/5     Lower extremity   5/5 Tone and bulk:normal tone throughout; no atrophy noted Sensory: Pinprick and light touch intact throughout, bilaterally Deep Tendon Reflexes: 2+ and symmetric throughout Plantars: Right: downgoing   Left: downgoing Cerebellar: normal finger-to-nose, normal rapid alternating movements  and normal heel-to-shin test Gait: normal gait and station     Lab Results: Basic Metabolic Panel: Recent Labs  Lab 11/19/17 1620  NA 136  K 4.6  CL 103  CO2 26  GLUCOSE 195*  BUN 19  CREATININE 0.99  CALCIUM 9.5    CBC: Recent Labs  Lab 11/19/17 1620  WBC 6.5  NEUTROABS 4.8  HGB 11.7*  HCT 35.6*  MCV 94.3  PLT 240    Coagulation Studies: Recent Labs    11/19/17 1620  LABPROT 13.5  INR 1.04    Imaging: Ct Angio Head W Or Wo Contrast  Result Date: 11/19/2017 CLINICAL DATA:  Acute onset dizziness and aphasia at 1400 hours. RIGHT-sided weakness. Assess stroke. EXAM: CT ANGIOGRAPHY HEAD AND NECK CT PERFUSION BRAIN TECHNIQUE: Multidetector CT imaging of the head and neck was performed using the  standard protocol during bolus administration of intravenous contrast. Multiplanar CT image reconstructions and MIPs were obtained to evaluate the vascular anatomy. Carotid stenosis measurements (when applicable) are obtained utilizing NASCET criteria, using the distal internal carotid diameter as the denominator. Multiphase CT imaging of the brain was performed following IV bolus contrast injection. Subsequent parametric perfusion maps were calculated using RAPID software. CONTRAST:  100mL ISOVUE-370 IOPAMIDOL (ISOVUE-370) INJECTION 76% COMPARISON:  CT HEAD November 19, 2017 at 1627 hours FINDINGS: CT HEAD FINDINGS BRAIN: No intraparenchymal hemorrhage, mass effect nor midline shift. The ventricles and sulci are normal for age. Patchy supratentorial white matter hypodensities within normal range for patient's age, though non-specific are most compatible with chronic small vessel ischemic disease. Punctate LEFT frontal lobe calcification. Old RIGHT basal ganglia lacunar infarct. No acute large vascular territory infarcts. No abnormal extra-axial fluid collections. Basal cisterns are patent. VASCULAR: Moderate calcific atherosclerosis of the carotid siphons in RIGHT vertebral artery.  SKULL: No skull fracture. No significant scalp soft tissue swelling. SINUSES/ORBITS: Mild lobulated paranasal sinus mucosal thickening without air-fluid levels. Mastoid air cells are well aerated.The included ocular globes and orbital contents are non-suspicious. Status post bilateral ocular lens implants. OTHER: None. CTA NECK AORTIC ARCH: Normal appearance of the thoracic arch, normal branch pattern. Moderate calcific atherosclerosis aortic arch. The origins of the innominate, left Common carotid artery and subclavian artery are widely patent. RIGHT CAROTID SYSTEM: Common carotid artery is widely patent, mild calcific atherosclerosis. Moderate calcific atherosclerosis carotid bifurcation without hemodynamically significant stenosis by NASCET criteria. Normal appearance of the internal carotid artery. LEFT CAROTID SYSTEM: Common carotid artery is widely patent, mild calcific atherosclerosis. Moderate calcific atherosclerosis without hemodynamically significant stenosis by NASCET criteria. Focal luminal irregularity LEFT internal carotid artery origin, favoring sequelae of atherosclerosis, less likely pseudo aneurysm. VERTEBRAL ARTERIES:RIGHT vertebral artery is dominant. Calcific atherosclerosis resulting in severe stenosis RIGHT vertebral artery. The severe stenosis versus occluded LEFT P1 origin with immediate reconstitution, at diminutive thready vessel. SKELETON: No acute osseous process though bone windows have not been submitted. Patient is edentulous. Multilevel moderate to severe RIGHT cervical facet arthropathy. OTHER NECK: Soft tissues of the neck are nonacute though, not tailored for evaluation. UPPER CHEST: Tiny scattered centrilobular ground-glass nodules most compatible with respiratory bronchiolitis. Centrilobular emphysema. Multiple LEFT upper lobe pulmonary nodules including 10 mm spiculated nodule (series 15, image 221/280). LEFT upper lobe calcified granuloma. No superior mediastinal  lymphadenopathy. OTHER: None. CTA HEAD ANTERIOR CIRCULATION: Patent cervical internal carotid arteries, petrous, cavernous and supra clinoid internal carotid arteries. Atherosclerosis resulting in mild stenosis RIGHT supraclinoid internal carotid artery. Patent anterior communicating artery. Patent anterior and middle cerebral arteries. No large vessel occlusion, significant stenosis, contrast extravasation or aneurysm. POSTERIOR CIRCULATION: Patent RIGHT vertebral artery, vertebrobasilar junction and basilar artery, as well as main branch vessels. Extremely diminutive LEFT vertebral artery, with tandem severe stenosis versus occlusion and reconstitution. Bilateral posterior inferior cerebellar arteries are patent. Small LEFT P1 segment, robust LEFT posterior communicating artery. Patent posterior cerebral arteries. No large vessel occlusion, significant stenosis, contrast extravasation or aneurysm. VENOUS SINUSES: Major dural venous sinuses are patent though not tailored for evaluation on this angiographic examination. ANATOMIC VARIANTS: None. DELAYED PHASE: Not performed. MIP images reviewed. CT Brain Perfusion Findings: CBF (<30%) Volume: 0mL Perfusion (Tmax>6.0s) volume: 9mL Mismatch Volume: 9mL Infarction Location:LEFT posterior parietal lobe. Generalized prolonged T-max LEFT MCA and posterior watershed territory with preserved flow, potentially from ICA stenosis though, preserved LEFT ACA profusion. IMPRESSION: CT HEAD: 1. No acute intracranial process. 2. Old RIGHT  basal ganglia lacunar infarct, otherwise negative noncontrast CT HEAD for age. CTA NECK: 1. Atherosclerosis without hemodynamically significant stenosis. 2. Severe stenosis versus occluded LEFT vertebral artery origin with immediate reconstitution, diminutive LEFT vertebral artery. 3. **An incidental finding of potential clinical significance has been found. Multiple LEFT upper lobe pulmonary nodules including 10 mm spiculated nodule. Recommend  contrast-enhanced CT chest on a nonemergent basis to assess for extent of involvement. ** CTA HEAD: 1. No emergent large vessel occlusion or severe stenosis anterior circulation. 2. Severe stenosis versus tandem occlusion diminutive LEFT vertebral artery. RIGHT vertebral artery is dominant. CT PERFUSION: 1. Small perfusion defect LEFT parietal lobe/posterior watershed territory suggesting brain at risk. Acute findings discussed with and reconfirmed by Dr.Lindzen, Neurology on 11/19/2017 at 6:35 pm. Aortic Atherosclerosis (ICD10-I70.0) and Emphysema (ICD10-J43.9). Electronically Signed   By: Awilda Metro M.D.   On: 11/19/2017 18:36   Ct Angio Neck W Or Wo Contrast  Result Date: 11/19/2017 CLINICAL DATA:  Acute onset dizziness and aphasia at 1400 hours. RIGHT-sided weakness. Assess stroke. EXAM: CT ANGIOGRAPHY HEAD AND NECK CT PERFUSION BRAIN TECHNIQUE: Multidetector CT imaging of the head and neck was performed using the standard protocol during bolus administration of intravenous contrast. Multiplanar CT image reconstructions and MIPs were obtained to evaluate the vascular anatomy. Carotid stenosis measurements (when applicable) are obtained utilizing NASCET criteria, using the distal internal carotid diameter as the denominator. Multiphase CT imaging of the brain was performed following IV bolus contrast injection. Subsequent parametric perfusion maps were calculated using RAPID software. CONTRAST:  ISOVUE-370 IOPAMIDOL (ISOVUE-370) INJECTION 76% COMPARISON:  CT HEAD November 19, 2017 at 1627 hours FINDINGS: CT HEAD FINDINGS BRAIN: No intraparenchymal hemorrhage, mass effect nor midline shift. The ventricles and sulci are normal for age. Patchy supratentorial white matter hypodensities within normal range for patient's age, though non-specific are most compatible with chronic small vessel ischemic disease. Punctate LEFT frontal lobe calcification. Old RIGHT basal ganglia lacunar infarct. No acute  large vascular territory infarcts. No abnormal extra-axial fluid collections. Basal cisterns are patent. VASCULAR: Moderate calcific atherosclerosis of the carotid siphons in RIGHT vertebral artery. SKULL: No skull fracture. No significant scalp soft tissue swelling. SINUSES/ORBITS: Mild lobulated paranasal sinus mucosal thickening without air-fluid levels. Mastoid air cells are well aerated.The included ocular globes and orbital contents are non-suspicious. Status post bilateral ocular lens implants. OTHER: None. CTA NECK AORTIC ARCH: Normal appearance of the thoracic arch, normal branch pattern. Moderate calcific atherosclerosis aortic arch. The origins of the innominate, left Common carotid artery and subclavian artery are widely patent. RIGHT CAROTID SYSTEM: Common carotid artery is widely patent, mild calcific atherosclerosis. Moderate calcific atherosclerosis carotid bifurcation without hemodynamically significant stenosis by NASCET criteria. Normal appearance of the internal carotid artery. LEFT CAROTID SYSTEM: Common carotid artery is widely patent, mild calcific atherosclerosis. Moderate calcific atherosclerosis without hemodynamically significant stenosis by NASCET criteria. Focal luminal irregularity LEFT internal carotid artery origin, favoring sequelae of atherosclerosis, less likely pseudo aneurysm. VERTEBRAL ARTERIES:RIGHT vertebral artery is dominant. Calcific atherosclerosis resulting in severe stenosis RIGHT vertebral artery. The severe stenosis versus occluded LEFT P1 origin with immediate reconstitution, at diminutive thready vessel. SKELETON: No acute osseous process though bone windows have not been submitted. Patient is edentulous. Multilevel moderate to severe RIGHT cervical facet arthropathy. OTHER NECK: Soft tissues of the neck are nonacute though, not tailored for evaluation. UPPER CHEST: Tiny scattered centrilobular ground-glass nodules most compatible with respiratory bronchiolitis.  Centrilobular emphysema. Multiple LEFT upper lobe pulmonary nodules including 10 mm spiculated nodule (series  15, image 221/280). LEFT upper lobe calcified granuloma. No superior mediastinal lymphadenopathy. OTHER: None. CTA HEAD ANTERIOR CIRCULATION: Patent cervical internal carotid arteries, petrous, cavernous and supra clinoid internal carotid arteries. Atherosclerosis resulting in mild stenosis RIGHT supraclinoid internal carotid artery. Patent anterior communicating artery. Patent anterior and middle cerebral arteries. No large vessel occlusion, significant stenosis, contrast extravasation or aneurysm. POSTERIOR CIRCULATION: Patent RIGHT vertebral artery, vertebrobasilar junction and basilar artery, as well as main branch vessels. Extremely diminutive LEFT vertebral artery, with tandem severe stenosis versus occlusion and reconstitution. Bilateral posterior inferior cerebellar arteries are patent. Small LEFT P1 segment, robust LEFT posterior communicating artery. Patent posterior cerebral arteries. No large vessel occlusion, significant stenosis, contrast extravasation or aneurysm. VENOUS SINUSES: Major dural venous sinuses are patent though not tailored for evaluation on this angiographic examination. ANATOMIC VARIANTS: None. DELAYED PHASE: Not performed. MIP images reviewed. CT Brain Perfusion Findings: CBF (<30%) Volume: 0mL Perfusion (Tmax>6.0s) volume: 9mL Mismatch Volume: 9mL Infarction Location:LEFT posterior parietal lobe. Generalized prolonged T-max LEFT MCA and posterior watershed territory with preserved flow, potentially from ICA stenosis though, preserved LEFT ACA profusion. IMPRESSION: CT HEAD: 1. No acute intracranial process. 2. Old RIGHT basal ganglia lacunar infarct, otherwise negative noncontrast CT HEAD for age. CTA NECK: 1. Atherosclerosis without hemodynamically significant stenosis. 2. Severe stenosis versus occluded LEFT vertebral artery origin with immediate reconstitution, diminutive  LEFT vertebral artery. 3. **An incidental finding of potential clinical significance has been found. Multiple LEFT upper lobe pulmonary nodules including 10 mm spiculated nodule. Recommend contrast-enhanced CT chest on a nonemergent basis to assess for extent of involvement. ** CTA HEAD: 1. No emergent large vessel occlusion or severe stenosis anterior circulation. 2. Severe stenosis versus tandem occlusion diminutive LEFT vertebral artery. RIGHT vertebral artery is dominant. CT PERFUSION: 1. Small perfusion defect LEFT parietal lobe/posterior watershed territory suggesting brain at risk. Acute findings discussed with and reconfirmed by Dr.Lindzen, Neurology on 11/19/2017 at 6:35 pm. Aortic Atherosclerosis (ICD10-I70.0) and Emphysema (ICD10-J43.9). Electronically Signed   By: Awilda Metro M.D.   On: 11/19/2017 18:36   Ct Cerebral Perfusion W Contrast  Result Date: 11/19/2017 CLINICAL DATA:  Acute onset dizziness and aphasia at 1400 hours. RIGHT-sided weakness. Assess stroke. EXAM: CT ANGIOGRAPHY HEAD AND NECK CT PERFUSION BRAIN TECHNIQUE: Multidetector CT imaging of the head and neck was performed using the standard protocol during bolus administration of intravenous contrast. Multiplanar CT image reconstructions and MIPs were obtained to evaluate the vascular anatomy. Carotid stenosis measurements (when applicable) are obtained utilizing NASCET criteria, using the distal internal carotid diameter as the denominator. Multiphase CT imaging of the brain was performed following IV bolus contrast injection. Subsequent parametric perfusion maps were calculated using RAPID software. CONTRAST:  ISOVUE-370 IOPAMIDOL (ISOVUE-370) INJECTION 76% COMPARISON:  CT HEAD November 19, 2017 at 1627 hours FINDINGS: CT HEAD FINDINGS BRAIN: No intraparenchymal hemorrhage, mass effect nor midline shift. The ventricles and sulci are normal for age. Patchy supratentorial white matter hypodensities within normal range for  patient's age, though non-specific are most compatible with chronic small vessel ischemic disease. Punctate LEFT frontal lobe calcification. Old RIGHT basal ganglia lacunar infarct. No acute large vascular territory infarcts. No abnormal extra-axial fluid collections. Basal cisterns are patent. VASCULAR: Moderate calcific atherosclerosis of the carotid siphons in RIGHT vertebral artery. SKULL: No skull fracture. No significant scalp soft tissue swelling. SINUSES/ORBITS: Mild lobulated paranasal sinus mucosal thickening without air-fluid levels. Mastoid air cells are well aerated.The included ocular globes and orbital contents are non-suspicious. Status post bilateral ocular lens implants.  OTHER: None. CTA NECK AORTIC ARCH: Normal appearance of the thoracic arch, normal branch pattern. Moderate calcific atherosclerosis aortic arch. The origins of the innominate, left Common carotid artery and subclavian artery are widely patent. RIGHT CAROTID SYSTEM: Common carotid artery is widely patent, mild calcific atherosclerosis. Moderate calcific atherosclerosis carotid bifurcation without hemodynamically significant stenosis by NASCET criteria. Normal appearance of the internal carotid artery. LEFT CAROTID SYSTEM: Common carotid artery is widely patent, mild calcific atherosclerosis. Moderate calcific atherosclerosis without hemodynamically significant stenosis by NASCET criteria. Focal luminal irregularity LEFT internal carotid artery origin, favoring sequelae of atherosclerosis, less likely pseudo aneurysm. VERTEBRAL ARTERIES:RIGHT vertebral artery is dominant. Calcific atherosclerosis resulting in severe stenosis RIGHT vertebral artery. The severe stenosis versus occluded LEFT P1 origin with immediate reconstitution, at diminutive thready vessel. SKELETON: No acute osseous process though bone windows have not been submitted. Patient is edentulous. Multilevel moderate to severe RIGHT cervical facet arthropathy. OTHER NECK:  Soft tissues of the neck are nonacute though, not tailored for evaluation. UPPER CHEST: Tiny scattered centrilobular ground-glass nodules most compatible with respiratory bronchiolitis. Centrilobular emphysema. Multiple LEFT upper lobe pulmonary nodules including 10 mm spiculated nodule (series 15, image 221/280). LEFT upper lobe calcified granuloma. No superior mediastinal lymphadenopathy. OTHER: None. CTA HEAD ANTERIOR CIRCULATION: Patent cervical internal carotid arteries, petrous, cavernous and supra clinoid internal carotid arteries. Atherosclerosis resulting in mild stenosis RIGHT supraclinoid internal carotid artery. Patent anterior communicating artery. Patent anterior and middle cerebral arteries. No large vessel occlusion, significant stenosis, contrast extravasation or aneurysm. POSTERIOR CIRCULATION: Patent RIGHT vertebral artery, vertebrobasilar junction and basilar artery, as well as main branch vessels. Extremely diminutive LEFT vertebral artery, with tandem severe stenosis versus occlusion and reconstitution. Bilateral posterior inferior cerebellar arteries are patent. Small LEFT P1 segment, robust LEFT posterior communicating artery. Patent posterior cerebral arteries. No large vessel occlusion, significant stenosis, contrast extravasation or aneurysm. VENOUS SINUSES: Major dural venous sinuses are patent though not tailored for evaluation on this angiographic examination. ANATOMIC VARIANTS: None. DELAYED PHASE: Not performed. MIP images reviewed. CT Brain Perfusion Findings: CBF (<30%) Volume: 0mL Perfusion (Tmax>6.0s) volume: 9mL Mismatch Volume: 9mL Infarction Location:LEFT posterior parietal lobe. Generalized prolonged T-max LEFT MCA and posterior watershed territory with preserved flow, potentially from ICA stenosis though, preserved LEFT ACA profusion. IMPRESSION: CT HEAD: 1. No acute intracranial process. 2. Old RIGHT basal ganglia lacunar infarct, otherwise negative noncontrast CT HEAD for  age. CTA NECK: 1. Atherosclerosis without hemodynamically significant stenosis. 2. Severe stenosis versus occluded LEFT vertebral artery origin with immediate reconstitution, diminutive LEFT vertebral artery. 3. **An incidental finding of potential clinical significance has been found. Multiple LEFT upper lobe pulmonary nodules including 10 mm spiculated nodule. Recommend contrast-enhanced CT chest on a nonemergent basis to assess for extent of involvement. ** CTA HEAD: 1. No emergent large vessel occlusion or severe stenosis anterior circulation. 2. Severe stenosis versus tandem occlusion diminutive LEFT vertebral artery. RIGHT vertebral artery is dominant. CT PERFUSION: 1. Small perfusion defect LEFT parietal lobe/posterior watershed territory suggesting brain at risk. Acute findings discussed with and reconfirmed by Dr.Lindzen, Neurology on 11/19/2017 at 6:35 pm. Aortic Atherosclerosis (ICD10-I70.0) and Emphysema (ICD10-J43.9). Electronically Signed   By: Awilda Metro M.D.   On: 11/19/2017 18:36   Ct Head Code Stroke Wo Contrast  Result Date: 11/19/2017 CLINICAL DATA:  Code stroke.  Sudden onset dizziness and aphasia. EXAM: CT HEAD WITHOUT CONTRAST TECHNIQUE: Contiguous axial images were obtained from the base of the skull through the vertex without intravenous contrast. COMPARISON:  None FINDINGS: Brain: A  2 mm punctate density in the left frontal white matter is favored to reflect a calcification. No definite intracranial hemorrhage is identified. There is a chronic lacunar infarct in the right lentiform nucleus. No acute cortically based infarct, mass, midline shift, or extra-axial fluid collection is identified. Periventricular white matter hypodensities are nonspecific but compatible with mild chronic small vessel ischemic disease. Generalized cerebral atrophy is relatively mild for age. Vascular: Calcified atherosclerosis at the skullbase. No hyperdense vessel. Skull: No fracture or focal osseous  lesion. Sinuses/Orbits: Mild bilateral ethmoid air cell mucosal thickening. Clear mastoid air cells. No acute findings in the included orbits. Other: None. ASPECTS [facility] Stroke Program Early CT Score) - Ganglionic level infarction (caudate, lentiform nuclei, internal capsule, insula, M1-M3 cortex): 7 - Supraganglionic infarction (M4-M6 cortex): 3 Total score (0-10 with 10 being normal): 10 IMPRESSION: 1. No evidence of acute intracranial abnormality. 2. ASPECTS is 10. 3. Mild chronic small vessel ischemic disease and cerebral atrophy. Chronic right basal ganglia lacunar infarct. These results were called by telephone at the time of interpretation on 11/19/2017 at 4:40 pm to Dr. Sharman Cheek , who verbally acknowledged these results. Electronically Signed   By: Sebastian Ache M.D.   On: 11/19/2017 16:40     ASSESSMENT AND PLAN  83 y.o. male with past medical history of diabetes, hypertension, peripheral arterial disease, coronary artery disease who presents to [facility] hospital with sudden onset aphasia  2 PM today. On exam he has aphasia and left side preference. Later was told patient was weaker on his right side around midnight. Stat MRI was done which showed no evidence of stroke. CTA  did not show a large vessel occlusion, however showed atherosclerotic disease in proximal ICA and stenosis, however on radiology read was read as non significant. Also radiology mentioned  diminutive vertebral which is not related to presentation and may be hypoplastic. CT perfusion did suggest perfusion deficit in left parietal lobe. MR Brain showed no evidence of stroke despite on going symptoms. Trial of  Ativan 2mg  was given to see if patient improves if this was a seizure with no response. Other possibility is patient has CHF and maybe hypoperfusing left hemisphere with  left carotid artery stenosis.   Cerebral hypoperfusion vs Seizure   Recommend # Carotid US to assess degree of flow in left  carotid #Transthoracic Echo  # Hold ASA as pt s/p tpa, repeat CT in 24 hrs  #Start or continue Atorvastatin 80 mg/other high intensity statin # BP goal: permissive HTN upto 210 systolic, PRNs above 21 # HBAIC and Lipid profile # Telemetry monitoring # Frequent neuro checks # NPO until passes stroke swallow screen # stat EEG   Diabetes Mellitus SS insulin   Essential Hypertension  Permissive HTN upto 185 systolic PRN labetolol ordered   CAD Hold antiplatelets, BB and antihypertensives for today   Sushanth Aroor [facility] Pager Number [phone]

## 2017-11-20 NOTE — Progress Notes (Signed)
STROKE TEAM PROGRESS NOTE   SUBJECTIVE (INTERVAL HISTORY) His wife and pastor were at the bedside. EEG is in progress. Pt was given 2mg  ativan at 5:17am. Currently pt eyes closed, nonverbal, right hemiparesis, agitated in bed, not lying still for EEG. Had temp 100.9 this am. EEG showed no seizure. MRI no acute stroke, CTP showed mildly increased TTP at left hemisphere.  Pt wife stated that yesterday, he was sitting in the driver seat of the car, started to have automatism. Then he drove erratically. Then because confused at home. They went to his PCP office and sent to ED where he was found to have AMS wax and waning, aphasia and confusion. tPA was given. Symptoms continues to wax and wane, eventually transferred to [facility].  As per wife, he had CABG in the past. 3-4 months later, he had episode of confusion, trying to climb on to table and he thought he was going to bed to sleep. He was sent to hospital, had LP as per wife, but not sure what was the diagnosis.    OBJECTIVE Temp:  [97.9 F (36.6 C)-100.9 F (38.3 C)] 100.9 F (38.3 C) (12/20 0800) Pulse Rate:  [61-81] 81 (12/20 0800) Cardiac Rhythm: Normal sinus rhythm (12/20 0800) Resp:  [14-24] 15 (12/20 0800) BP: (137-188)/(64-94) 148/73 (12/20 0800) SpO2:  [95 %-100 %] 99 % (12/20 0800) Weight:  [124 lb (56.2 kg)] 124 lb (56.2 kg) (12/19 1624)  Recent Labs  Lab 11/19/17 1620 11/20/17 0231 11/20/17 0825  GLUCAP 204* 104* 105*   Recent Labs  Lab 11/19/17 1620 11/20/17 0728  NA 136 138  K 4.6 4.3  CL 103 104  CO2 26 21*  GLUCOSE 195* 90  BUN 19 11  CREATININE 0.99 1.12  CALCIUM 9.5 9.2   Recent Labs  Lab 11/19/17 1620  AST 24  ALT 17  ALKPHOS 65  BILITOT 0.7  PROT 6.7  ALBUMIN 4.2   Recent Labs  Lab 11/19/17 1620 11/20/17 0728  WBC 6.5 8.3  NEUTROABS 4.8  --   HGB 11.7* 11.6*  HCT 35.6* 34.9*  MCV 94.3 92.1  PLT 240 232   Recent Labs  Lab 11/19/17 1620  TROPONINI 0.05*   Recent Labs    11/19/17 1620   LABPROT 13.5  INR 1.04   No results for input(s): COLORURINE, LABSPEC, PHURINE, GLUCOSEU, HGBUR, BILIRUBINUR, KETONESUR, PROTEINUR, UROBILINOGEN, NITRITE, LEUKOCYTESUR in the last 72 hours.  Invalid input(s): APPERANCEUR     Component Value Date/Time   CHOL 129 11/20/2017 0559   TRIG 56 11/20/2017 0559   HDL 71 11/20/2017 0559   CHOLHDL 1.8 11/20/2017 0559   VLDL 11 11/20/2017 0559   LDLCALC 47 11/20/2017 0559   Lab Results  Component Value Date   HGBA1C 7.3 (H) 11/20/2017   No results found for: LABOPIA, COCAINSCRNUR, LABBENZ, AMPHETMU, THCU, LABBARB  No results for input(s): ETH in the last 168 hours.  I have personally reviewed the radiological images below and agree with the radiology interpretations.  Ct Angio Head and neck and CTP W Or Wo Contrast 11/19/2017 IMPRESSION: CT HEAD: 1. No acute intracranial process. 2. Old RIGHT basal ganglia lacunar infarct, otherwise negative noncontrast CT HEAD for age. CTA NECK: 1. Atherosclerosis without hemodynamically significant stenosis. 2. Severe stenosis versus occluded LEFT vertebral artery origin with immediate reconstitution, diminutive LEFT vertebral artery. 3. **An incidental finding of potential clinical significance has been found. Multiple LEFT upper lobe pulmonary nodules including 10 mm spiculated nodule. Recommend contrast-enhanced CT chest on a  nonemergent basis to assess for extent of involvement. ** CTA HEAD: 1. No emergent large vessel occlusion or severe stenosis anterior circulation. 2. Severe stenosis versus tandem occlusion diminutive LEFT vertebral artery. RIGHT vertebral artery is dominant. CT PERFUSION: 1. Small perfusion defect LEFT parietal lobe/posterior watershed territory suggesting brain at risk. Acute findings discussed with and reconfirmed by Dr.Lindzen, Neurology on 11/19/2017 at 6:35 pm. Aortic Atherosclerosis (ICD10-I70.0) and Emphysema (ICD10-J43.9).   Mr Brain Wo Contrast 11/20/2017 IMPRESSION: 1. No  acute intracranial abnormality. 2. Chronic microvascular ischemia and old right corona radiata lacunar infarct. 3. Loss of the normal left vertebral artery flow void, consistent with the severely diminutive left vertebral artery demonstrated on the earlier CTA. Electronically Signed   By: Deatra RobinsonKevin  Herman M.D.   On: 11/20/2017 04:16   Ct Head Code Stroke Wo Contrast 11/19/2017 IMPRESSION: 1. No evidence of acute intracranial abnormality. 2. ASPECTS is 10. 3. Mild chronic small vessel ischemic disease and cerebral atrophy. Chronic right basal ganglia lacunar infarct.   EEG: This EEG is abnormal due to diffuse slowing of the waking background. Clinical Correlation of the above findings indicates diffuse cerebral dysfunction that is non-specific in etiology and can be seen with hypoxic/ischemic injury, toxic/metabolic encephalopathies, neurodegenerative disorders, or medication effect.  However, findings may also be due to excessive drowsiness.  Clinical correlation advised.  TTE pending  MRI with and without contrast pending  LP pending   PHYSICAL EXAM  Temp:  [97.9 F (36.6 C)-100.9 F (38.3 C)] 100.9 F (38.3 C) (12/20 0800) Pulse Rate:  [61-81] 81 (12/20 0800) Resp:  [14-24] 15 (12/20 0800) BP: (137-188)/(64-94) 148/73 (12/20 0800) SpO2:  [95 %-100 %] 99 % (12/20 0800) Weight:  [124 lb (56.2 kg)] 124 lb (56.2 kg) (12/19 1624)  General - Well nourished, well developed, agitated and not following commands.  Ophthalmologic - fundi not visualized due to noncooperation.  Cardiovascular - Regular rate and rhythm.  Neuro - EEG leads on scalp, agitated and not following commands. Eyes closed, but able to briefly open with voice. Nonverbal, not able to name or repeat, not following commands. Slight blinking to visual threat on the left, but not on the right. Eyes mainly natural position, not significant movement bilaterally. PREEL. Positive corneal and gag. LUE and LLE move spontaneously and  against gravity, RUE 2/5 and RLE 3/5 on pain. DTR 1+ and not cooperative on babinski. Sensation, coordination and gait not tested.   ASSESSMENT/PLAN Peter Becker is a 83 y.o. male with history of DM, HTN, MI s/p CABG, PVD, HLD on lovastatin admitted for AMS, confusion, aphasia, wax and waning process. TPA given at OSH.    AMS - stroke vs. Seizure vs. CNS infection. Course not quite consistent with stroke, but TTP showed mild left hemisphere delayed perfusion. Symptoms concerning for seizure but EEG so far negative and TTP not consistent. Had fever this am, need to rule out CNS infection.   Resultant AMS, nonverbal, agitation, left hemiparesis  MRI  No acute stroke  CTA head and neck b/l ICA proximal and right VA origin athero, left VA hypoplastic  2D Echo  Pending  EEG - diffuse slowing no seizure  TSH/B12 WNL, ammonia pending  MRI with and without contrast pending  Consider LP after 24h of tPA - do not think empiric Abx needed at this time (one time fever, no leukocytosis, neck supple)  Blood culture sent  If no improvement, consider LTM EEG overnight or in am  LDL 47  HgbA1c 7.3  SCDs  for VTE prophylaxis  Diet NPO time specified   aspirin 81 mg daily prior to admission, now on No antithrombotic within 24h of tPA  Ongoing aggressive stroke risk factor management  Therapy recommendations:  pending  Disposition:  pending  Diabetes  HgbA1c 7.3 goal < 7.0  Uncontrolled  Home meds -glyburide  CBG monitoring  SSI  Hypertension Stable Permissive hypertension (OK if <180/105) for 24-48 hours post stroke and then gradually normalized within 5-7 days.  Long term BP goal normotensive  Hyperlipidemia  Home meds:  lovastatin   LDL 47, goal < 70  Resume once po access  Continue statin at discharge  Other Stroke Risk Factors  Advanced age  CAD/MI s/p CABG  PVD  Other Active Problems  Tmax - 100.9  Hospital day # 1  This patient is critically  ill due to AMS, s/p tPA, agitation and at significant risk of neurological worsening, death form stroke, hemorrhage, seizure, status epilepticus, sepsis. This patient's care requires constant monitoring of vital signs, hemodynamics, respiratory and cardiac monitoring, review of multiple databases, neurological assessment, discussion with family, other specialists and medical decision making of high complexity. I spent 45 minutes of neurocritical care time in the care of this patient.  Peter PlanJindong Starlynn Klinkner, MD PhD Stroke Neurology 11/20/2017 10:55 AM    To contact Stroke Continuity provider, please refer to [email or web address]. After hours, contact General Neurology

## 2017-11-20 NOTE — Progress Notes (Signed)
CRITICAL VALUE ALERT  Critical Value:  WBC, CSF Tube 1: 23            Tube 4: 33   Date & Time Notied:  2030  Provider Notified: Dr. Laurence SlateAroor  Orders Received/Actions taken: awaiting orders

## 2017-11-20 NOTE — Progress Notes (Addendum)
Initial Nutrition Assessment  INTERVENTION:   Supplement diet once advanced  If pt needs alternative nutrition recommend: Cortrak    NUTRITION DIAGNOSIS:   Inadequate oral intake related to lethargy/confusion as evidenced by NPO status.  GOAL:   Patient will meet greater than or equal to 90% of their needs  MONITOR:   Diet advancement, I & O's  REASON FOR ASSESSMENT:   Malnutrition Screening Tool    ASSESSMENT:   Pt with PMH of DM, HTN, PAD, CAD s/p CABG admitted with aphasia, [facility] and received [facility].    Pt discussed during ICU rounds and with RN.  Per MRI, no stroke. EEG inconclusive.  + febrile  Wife at bedside and provides hx. She reports that 8 years ago pt was dx with pernicious anemia and now takes vitamin B12. She states that before the dx he was 165 lb but lost a lot of weight and has been unable to regain the weight. She reports that his weight is usually 120-125 lb. He has a great appetite, and eats 3 full meals per day. She reports that pt has had some episodes of higher blood sugars (150). He used to drink ensure but stopped due to higher blood sugars. He is active.  Exam shows muscle and fat depletion. However per wife he has been stable at this weight for a long time.  Pt confused and is unable to answer any questions.  SLP deferred eval due to lethargy.   Medications and reviewed  Lab Results  Component Value Date   HGBA1C 7.3 (H) 11/20/2017       NUTRITION - FOCUSED PHYSICAL EXAM:    Most Recent Value  Orbital Region  Moderate depletion  Upper Arm Region  Moderate depletion  Thoracic and Lumbar Region  Severe depletion  Buccal Region  Moderate depletion  Temple Region  Moderate depletion  Clavicle Bone Region  Moderate depletion  Clavicle and Acromion Bone Region  Mild depletion  Scapular Bone Region  Unable to assess  Dorsal Hand  Moderate depletion  Patellar Region  Severe depletion  Anterior Thigh Region  Severe depletion   Posterior Calf Region  Severe depletion  Edema (RD Assessment)  None  Hair  Reviewed  Eyes  Unable to assess  Mouth  Reviewed  Skin  Reviewed  Nails  Reviewed       Diet Order:  Diet NPO time specified  EDUCATION NEEDS:   No education needs have been identified at this time  Skin:  Skin Assessment: Reviewed RN Assessment  Last BM:  unknown  Height:   Ht Readings from Last 1 Encounters:  11/19/17 5\' 7"  (1.702 m)    Weight:   Wt Readings from Last 1 Encounters:  11/19/17 124 lb (56.2 kg)    Ideal Body Weight:  67.2 kg  BMI:  There is no height or weight on file to calculate BMI.  Estimated Nutritional Needs:   Kcal:  1600-1800  Protein:  85-100 grams  Fluid:  > 1.6 L/day  Kendell BaneHeather Victoriano Campion RD, LDN, CNSC [phone] Pager [phone] After Hours Pager

## 2017-11-20 NOTE — Procedures (Signed)
Procedure Note: Lumbar puncture  Indications: assess for CNS pathology   Operator: Marvel PlanJindong Hashem Goynes, MD, PhD  Others present: Consuella LoseElaine, RN  Indications, risks, and benefits explained to patient / surrogate decision maker and informed consent obtained. Time-out was performed, with all individuals present agreeing on the procedure to be performed, the site of procedure, and the patient identity.  Patient positioned, prepped and draped in usual sterile fashion. L3-4 space located using bilateral iliac crests as landmarks. 1% Lidocaine without epinephrine was used to anesthetize the area. An 20G spinal needle was introduced into the sub- arachnoid space. Stylet was removed with appropriate fluid return. Needle removed after adequate fluid collected. Blood loss was minimal. A dry guaze dressing was placed over insertion site. Patient tolerated the procedure well and no complications were observed. Spontaneous movement of bilateral extremities were observed after the procedure.  Had fibrinogen check prior to LP which was 362 at 24h post tPA.   Opening pressure: 15 cm H20 (hips & legs flexed)  Total fluid removed: 11cc  Color of fluid: slightly yellowish  Sent for: cell count + diff , gram stain, cultures, glucose, protein, crypto antigen, HSV PCR, VDRL  Marvel PlanJindong Ilianna Bown, MD PhD Stroke Neurology 11/20/2017 6:49 PM

## 2017-11-20 NOTE — Progress Notes (Signed)
SLP Cancellation Note  Patient Details Name: Peter FretRalph Becker MRN: [id number] DOB: [birth year]   Cancelled treatment:       Reason Eval/Treat Not Completed: Patient's level of consciousness. Discussed with RN. Will follow for readiness.    Jiyah Torpey, Riley NearingBonnie Caroline 11/20/2017, 10:14 AM

## 2017-11-20 NOTE — Progress Notes (Signed)
Pharmacy Antibiotic Note  Peter FretRalph Becker is a 83 y.o. male admitted on 11/19/2017 with meningitis.  Pharmacy has been consulted for acyclovir dosing.  Patient admitted with stroke and s/p tPA on 12/19 at 1755 at [facility]. Now s/p LP which showed elevated WBC, RBC, and protein. Differential is still pending.  Plan: Acyclovir 10mg /kg (560mg ) IV q12h  Follow clinical progression, any other antimicrobials, renal function     Temp (24hrs), Avg:100.2 F (37.9 C), Min:98.2 F (36.8 C), Max:101.7 F (38.7 C)  Recent Labs  Lab 11/19/17 1620 11/20/17 0728  WBC 6.5 8.3  CREATININE 0.99 1.12    Estimated Creatinine Clearance: 37.6 mL/min (by C-G formula based on SCr of 1.12 mg/dL).    Allergies  Allergen Reactions  . Tape Other (See Comments)    SKIN IS VERY THIN AND TEARS AND BRUISES EASILY; Please use an alternative!!    Antimicrobials this admission: Acyclovir 12/20 >>   Dose adjustments this admission: n/a  Microbiology results: 12/20 BCx:  12/20 CSF:  12/20 MRSA PCR: neg  Thank you for allowing pharmacy to be a part of this patient's care.  Wyatt Galvan D. Gevork Ayyad, PharmD, BCPS Clinical Pharmacist [phone] 11/20/2017 9:17 PM

## 2017-11-20 NOTE — Progress Notes (Signed)
OT Cancellation Note  Patient Details Name: Rosana FretRalph Lovett MRN: [id number] DOB: 09-08-1929   Cancelled Treatment:    Reason Eval/Treat Not Completed: Patient not medically ready(on bedrest. ) Please update activity orders when appropriate for therapy. Thanks  [facility] Antonio[facility],HILLARY  Teal Bontrager, OT/L  [phone] 11/20/2017 11/20/2017, 6:49 AM

## 2017-11-20 NOTE — Progress Notes (Signed)
PT Cancellation Note  Patient Details Name: Peter FretRalph Becker MRN: [id number] DOB: [birth year]   Cancelled Treatment:    Reason Eval/Treat Not Completed: Medical issues which prohibited therapy(pt currently on strict bedrest)   Davine Coba B Gus Littler 11/20/2017, 7:18 AM  Delaney MeigsMaija Tabor Nihal Marzella, PT [phone]

## 2017-11-20 NOTE — Procedures (Signed)
ELECTROENCEPHALOGRAM REPORT  Date of Study: 11/20/2017  Patient's Name: Peter FretRalph Becker MRN: [id number] Date of Birth: 10/02/1929  Referring Provider: Elba BarmanSushanth R Aroor, MD  Clinical History: 83 year old man with sudden onset of aphasia.  MRI negative for stroke.  Medications: Ativan earlier in the morning  Technical Summary: A multichannel digital EEG recording measured by the international 10-20 system with electrodes applied with paste and impedances below 5000 ohms performed as portable with EKG monitoring in a patient awake but mostly drowsy.  Hyperventilation and photic stimulation were not performed.  The digital EEG was referentially recorded, reformatted, and digitally filtered in a variety of bipolar and referential montages for optimal display.   Description: The patient is drowsy during the recording.  During maximal wakefulness, there is a symmetric, low voltage 9 Hz posterior dominant rhythm that attenuates with eye opening. This is admixed with diffuse 4-5 Hz theta and 2-3 Hz delta slowing of the waking background.  Stage 2 sleep was not seen.  There were no epileptiform discharges or electrographic seizures seen.    EKG lead was unremarkable.  Impression: This EEG is abnormal due to diffuse slowing of the waking background.  Clinical Correlation of the above findings indicates diffuse cerebral dysfunction that is non-specific in etiology and can be seen with hypoxic/ischemic injury, toxic/metabolic encephalopathies, neurodegenerative disorders, or medication effect.  However, findings may also be due to excessive drowsiness.  Clinical correlation advised.   Shon MilletAdam Jaffe, DO

## 2017-11-20 NOTE — Progress Notes (Signed)
After administering ativan IVP, pt became significantly drowsy, stopped following commands and NIH exam changed drastically. All of this information was updated to the on-call neurologist and he is contributing the change to the ativan that was given. No further interventions concerning neuro assessment to be completed at this time.  Will continue to monitor.  Francia GreavesSavannah R Abbagayle Zaragoza, RN

## 2017-11-21 ENCOUNTER — Inpatient Hospital Stay (HOSPITAL_COMMUNITY): Payer: Medicare Other

## 2017-11-21 DIAGNOSIS — I6502 Occlusion and stenosis of left vertebral artery: Secondary | ICD-10-CM

## 2017-11-21 DIAGNOSIS — A879 Viral meningitis, unspecified: Principal | ICD-10-CM

## 2017-11-21 DIAGNOSIS — Z87891 Personal history of nicotine dependence: Secondary | ICD-10-CM

## 2017-11-21 DIAGNOSIS — I714 Abdominal aortic aneurysm, without rupture: Secondary | ICD-10-CM

## 2017-11-21 DIAGNOSIS — E049 Nontoxic goiter, unspecified: Secondary | ICD-10-CM

## 2017-11-21 DIAGNOSIS — Z8611 Personal history of tuberculosis: Secondary | ICD-10-CM

## 2017-11-21 DIAGNOSIS — Z951 Presence of aortocoronary bypass graft: Secondary | ICD-10-CM

## 2017-11-21 DIAGNOSIS — R918 Other nonspecific abnormal finding of lung field: Secondary | ICD-10-CM

## 2017-11-21 DIAGNOSIS — I2510 Atherosclerotic heart disease of native coronary artery without angina pectoris: Secondary | ICD-10-CM

## 2017-11-21 DIAGNOSIS — J439 Emphysema, unspecified: Secondary | ICD-10-CM

## 2017-11-21 DIAGNOSIS — Z91048 Other nonmedicinal substance allergy status: Secondary | ICD-10-CM

## 2017-11-21 DIAGNOSIS — G0490 Encephalitis and encephalomyelitis, unspecified: Secondary | ICD-10-CM

## 2017-11-21 DIAGNOSIS — R4182 Altered mental status, unspecified: Secondary | ICD-10-CM

## 2017-11-21 DIAGNOSIS — R05 Cough: Secondary | ICD-10-CM

## 2017-11-21 DIAGNOSIS — R569 Unspecified convulsions: Secondary | ICD-10-CM

## 2017-11-21 DIAGNOSIS — R509 Fever, unspecified: Secondary | ICD-10-CM

## 2017-11-21 DIAGNOSIS — R059 Cough, unspecified: Secondary | ICD-10-CM

## 2017-11-21 DIAGNOSIS — M810 Age-related osteoporosis without current pathological fracture: Secondary | ICD-10-CM

## 2017-11-21 LAB — CBC
HEMATOCRIT: 34.3 % — AB (ref 39.0–52.0)
HEMOGLOBIN: 11.5 g/dL — AB (ref 13.0–17.0)
MCH: 30.7 pg (ref 26.0–34.0)
MCHC: 33.5 g/dL (ref 30.0–36.0)
MCV: 91.5 fL (ref 78.0–100.0)
Platelets: 212 10*3/uL (ref 150–400)
RBC: 3.75 MIL/uL — AB (ref 4.22–5.81)
RDW: 13 % (ref 11.5–15.5)
WBC: 8.3 10*3/uL (ref 4.0–10.5)

## 2017-11-21 LAB — GLUCOSE, CAPILLARY
GLUCOSE-CAPILLARY: 103 mg/dL — AB (ref 65–99)
GLUCOSE-CAPILLARY: 106 mg/dL — AB (ref 65–99)
Glucose-Capillary: 123 mg/dL — ABNORMAL HIGH (ref 65–99)
Glucose-Capillary: 125 mg/dL — ABNORMAL HIGH (ref 65–99)
Glucose-Capillary: 90 mg/dL (ref 65–99)
Glucose-Capillary: 98 mg/dL (ref 65–99)

## 2017-11-21 LAB — BASIC METABOLIC PANEL
ANION GAP: 11 (ref 5–15)
BUN: 13 mg/dL (ref 6–20)
CHLORIDE: 103 mmol/L (ref 101–111)
CO2: 23 mmol/L (ref 22–32)
Calcium: 8.8 mg/dL — ABNORMAL LOW (ref 8.9–10.3)
Creatinine, Ser: 1.23 mg/dL (ref 0.61–1.24)
GFR calc Af Amer: 59 mL/min — ABNORMAL LOW (ref >60)
GFR, EST NON AFRICAN AMERICAN: 51 mL/min — AB (ref >60)
GLUCOSE: 104 mg/dL — AB (ref 65–99)
POTASSIUM: 3.9 mmol/L (ref 3.5–5.1)
Sodium: 137 mmol/L (ref 135–145)

## 2017-11-21 LAB — PROCALCITONIN

## 2017-11-21 LAB — PATHOLOGIST SMEAR REVIEW

## 2017-11-21 LAB — VDRL, CSF: VDRL Quant, CSF: NONREACTIVE

## 2017-11-21 MED ORDER — CHLORHEXIDINE GLUCONATE 0.12 % MT SOLN
15.0000 mL | Freq: Two times a day (BID) | OROMUCOSAL | Status: DC
  Administered 2017-11-21 – 2017-11-27 (×14): 15 mL via OROMUCOSAL
  Filled 2017-11-21 (×9): qty 15

## 2017-11-21 MED ORDER — ORAL CARE MOUTH RINSE
15.0000 mL | Freq: Two times a day (BID) | OROMUCOSAL | Status: DC
  Administered 2017-11-21 – 2017-11-27 (×12): 15 mL via OROMUCOSAL

## 2017-11-21 MED ORDER — VALPROATE SODIUM 500 MG/5ML IV SOLN
500.0000 mg | Freq: Three times a day (TID) | INTRAVENOUS | Status: DC
  Administered 2017-11-21 – 2017-11-27 (×19): 500 mg via INTRAVENOUS
  Filled 2017-11-21 (×26): qty 5

## 2017-11-21 NOTE — Procedures (Signed)
ELECTROENCEPHALOGRAM REPORT  Date of Study: 11/21/2017  Patient's Name: Peter Becker MRN: [id number] Date of Birth: 06/27/1929  Referring Provider: Dr. Marvel PlanJindong Xu  Clinical History: This is an 83 year old man with altered mental status, seizure-like activity.  Medications: Depacon  Technical Summary: A multichannel digital EEG recording measured by the international 10-20 system with electrodes applied with paste and impedances below 5000 ohms performed in our laboratory with EKG monitoring in an awake and asleepd patient.  Hyperventilation and photic stimulation were not performed.  The digital EEG was referentially recorded, reformatted, and digitally filtered in a variety of bipolar and referential montages for optimal display.    Description: The patient is awake and asleep during the recording.  During maximal wakefulness, there is a symmetric, medium voltage 8 Hz posterior dominant rhythm that attenuates with eye opening. There is loss of faster frequencies over the left hemisphere with polymorphic delta slowing in this region, maximal over the left frontocentrotemporal regions.During drowsiness and sleep, there is an increase in theta and delta slowing of the background with poorly formed vertex waves seen.  Hyperventilation and photic stimulation were not performed.  There were no epileptiform discharges or electrographic seizures seen.    EKG lead was unremarkable.  Impression: This awake and asleep EEG is abnormal due to occasional focal slowing over the left hemisphere, maximal over the left frontocentrotemporal region.  Clinical Correlation of the above findings indicates focal cerebral dysfunction over the left hemisphere region suggestive of underlying structural or physiologic abnormality. The absence of epileptiform discharges does not exclude a clinical diagnosis of epilepsy. Clinical correlation is advised.   Patrcia DollyKaren Aquino, M.D.

## 2017-11-21 NOTE — Progress Notes (Signed)
PT Cancellation Note  Patient Details Name: Rosana FretRalph Schaber MRN: [id number] DOB: [birth year]   Cancelled Treatment:    Reason Eval/Treat Not Completed: Medical issues which prohibited therapy(pt on bedrest and await increased activity order)   Brandace Cargle B Yeraldin Litzenberger 11/21/2017, 7:56 AM  Delaney MeigsMaija Tabor Nashon Erbes, PT [phone]

## 2017-11-21 NOTE — Progress Notes (Signed)
OT Cancellation Note  Patient Details Name: Rosana FretRalph Mascari MRN: [id number] DOB: 1929/08/23   Cancelled Treatment:    Reason Eval/Treat Not Completed: Patient not medically ready. Pt still remains on strict bedrest, will continue to hold eval at this time until activity orders increased.  Evette GeorgesLeonard, Rakayla Ricklefs Eva [phone] 11/21/2017, 7:18 AM

## 2017-11-21 NOTE — Progress Notes (Signed)
EEG completed, results pending. 

## 2017-11-21 NOTE — Evaluation (Signed)
Speech Language Pathology Evaluation Patient Details Name: Peter FretRalph Becker MRN: [id number] DOB: [birth year] Today's Date: 11/21/2017 Time: 1030-1040 SLP Time Calculation (min) (ACUTE ONLY): 10 min  Problem List:  Patient Active Problem List   Diagnosis Date Noted  . Altered mental status   . Ischemic stroke (HCC) 11/19/2017  . AAA (abdominal aortic aneurysm) without rupture (HCC) 01/20/2017  . PAD (peripheral artery disease) (HCC) 01/20/2017  . CAD (coronary artery disease) 01/20/2017  . Essential hypertension 01/20/2017  . Hyperlipidemia 01/20/2017  . Chronic venous insufficiency 01/20/2017  . Venous stasis dermatitis 01/20/2017   Past Medical History:  Past Medical History:  Diagnosis Date  . Diabetes mellitus without complication (HCC)   . Hypertension   . Myocardial infarct [facility](HCC)    Past Surgical History:  Past Surgical History:  Procedure Laterality Date  . CARPAL TUNNEL RELEASE Left   . CHOLECYSTECTOMY    . EYE SURGERY    . heart stent     triple bypass  . HEMORRHOID SURGERY    . TONSILLECTOMY     HPI:  Mr.Lc Irvinis a 83 y.o.malewith history of DM, HTN, MI s/p CABG, PVD, HLD on lovastatin admitted for AMS, confusion, aphasia, wax and waning process. TPA given at OSH. MRI shows no acute CVA, but MD note states Seizure with [name] paralysisvs. CNS infection - presentation concerning for seizure like activity and CSF concerning for viral meningitis   Assessment / Plan / Recommendation Clinical Impression  Brief evaluation due to pt lethargy. Pt observed to demosntrate auditory comprehension of simple langauge by following 3/3 one step commands. Pt with open mouth posture, unable to achieve any articulation, lingual movement or labial closure with max assist. Briefly participated in washing his face with tactile and verbal cues. Provided education to wife about the role of SLP. Hopeful for increased participation when pt more alert. Will follow for opportunities of  cognitive recovery.     SLP Assessment  SLP Recommendation/Assessment: Patient needs continued Speech Lanaguage Pathology Services SLP Visit Diagnosis: Cognitive communication deficit (R41.841)    Follow Up Recommendations       Frequency and Duration min 2x/week  2 weeks      SLP Evaluation Cognition  Overall Cognitive Status: Impaired/Different from baseline Arousal/Alertness: Suspect due to medications Orientation Level: (mute) Attention: Focused Focused Attention: Impaired Focused Attention Impairment: Verbal basic;Functional basic Problem Solving: Impaired       Comprehension  Auditory Comprehension Overall Auditory Comprehension: Appears within functional limits for tasks assessed Commands: Within Functional Limits    Expression Verbal Expression Overall Verbal Expression: Other (comment)(unable to articulate or phonate)   Oral / Motor  Oral Motor/Sensory Function Overall Oral Motor/Sensory Function: Severe impairment Motor Speech Overall Motor Speech: Impaired Respiration: Impaired Articulation: Impaired Level of Impairment: Word Intelligibility: Unable to assess (comment)   GO                   Harlon DittyBonnie Dominie Benedick, MA CCC-SLP [phone]  Claudine MoutonDeBlois, Desean Heemstra Caroline 11/21/2017, 2:48 PM

## 2017-11-21 NOTE — Consult Note (Signed)
Date of Admission:  11/19/2017          Reason for Consult: concern for viral meningitis    Referring Provider: Dr. Roda ShuttersXu   Assessment: Encephalitis AMS with focal neurodeficits and CSF Pleocytosis  Unclear picture what the underlying cause of his symptoms are. Imaging has ruled out an acute infarct as the cause of his focal deficits.   He does have significant left vertebral artery stenosis which may suggest that his symptoms are related to episodes of  hypoperfusion without infarction.   His CSF is not consistent with bacterial meningitis but may suggest viral meningitis or even seizure activity. He did have 2 EEGs performed. Yesterday there was diffuse slowing. He was started on Valproate this morning. Repeat EEG today shows occasional focal slowing over the left hemisphere, maximally over the left frontocentrotemporal region.   HSV and Enterovirus CSF studies are currently pending. RBCs are present on CSF and increased on Tube 4, suggesting they are present without traumatic tap - HSV-1?. Negative results so far include VDRL and Cryptococcal antigen. He does not have a history of solid organ transplant, so HHV-6 is less likely. Cultures are pending, gram stain is negative for organisms.  He does have a remote history of exposure to TB, however this is less likely given his reported post-exposure treatment and recent negative AFB smears over six weeks (results in care everywhere).  Per his wife, he has not had any noticeable recent tick or bug bites. He does gardening and has a very small healed wound on his right thumb, however it is unlikely that he has meningeal sporotrichosis as his symptoms are acute rather than chronic or in the setting of fevers or headaches.  Plan: 1. Obtain MRI Cervical Spine to assess for possible cervical lesion as cause of his RUE focal deficits. 2. Check HIV 3. Add VZV PCR to CSF  4. F/u HSV, Enterovirus, Blood/CSF Cultures 5. Continue Acyclovir for  now  Active Problems:   Ischemic stroke (HCC)   Altered mental status   Scheduled Meds: . chlorhexidine  15 mL Mouth Rinse BID  . insulin aspart  0-9 Units Subcutaneous Q4H  . mouth rinse  15 mL Mouth Rinse q12n4p  . pantoprazole (PROTONIX) IV  40 mg Intravenous QHS   Continuous Infusions: . sodium chloride 75 mL/hr at 11/21/17 0445  . acyclovir Stopped (11/21/17 1040)  . valproic acid (DEPACON) IVPB 500 mg (11/21/17 1353)   PRN Meds:.acetaminophen, labetalol  HPI: Rosana FretRalph Gurr is a 83 y.o. male with history of CAD s/p CABG in 2007, T2DM, emphysema, AAA, Osteoporosis, Tobacco use (~ 60 pack years, quit in 2016), Pulmonary nodules, and previous exposure to TB (received 1 year prophylactic treatment) who [facility] for acute onset of aphasia, dizziness, and developed right sided weakness while in the ED. Code stroke was called he was given tPA after a CT head ruled out intracranial hemorrhage. He was subsequently [facility]. His wife is present who provides the majority of the history. Other history is obtained by chart review.  Mr. Elroy Channelrvin was in his usual state of health the morning of 12/19. At his baseline he is very active, ambulates on his own, and completes all his daily activities. He mowed the lawn on 12/18 and does occasional gardening. On 12/19, he and his wife were having lunch and began to drive home. Mr. Elroy Channelrvin was driving and his wife noticed that he was still chewing food and drooling.  She had to instruct him turn by turn on directions to their home. When they reached their home, he began to have severe dysarthria. His wife took him to his PCPs office who advised that they go to the ED.  At [facility] a code stroke was called as above and he eventually was given tPA and transferred [facility]. Wife states that he had alternating episodes of clear speech and dysarthria. She says he was [facility]febrile [facility] and began to have fevers after [facility] at  [facility].   Blood cultures were drawn and antibiotics have been withheld.   Workup so far included:  CTA head:  No emergent large vessel occlusion or severe stenosis anterior circulation. Severe stenosis versus tandem occlusion diminutive LEFT vertebral artery. RIGHT vertebral artery is dominant.  CTA neck: 1. Atherosclerosis without hemodynamically significant stenosis. 2. Severe stenosis versus occluded LEFT vertebral artery origin with immediate reconstitution, diminutive LEFT vertebral artery. 3. An incidental finding of potential clinical significance has been found. Multiple LEFT upper lobe pulmonary nodules including 10 mm spiculated nodule. Recommend contrast-enhanced CT chest on a nonemergent basis to assess for extent of involvement  CT Cerebral Perfusion: Small perfusion defect LEFT parietal lobe/posterior watershed territory suggesting brain at risk.  MRI Brain x 2: 12/20 3:59 AM 1. No acute intracranial abnormality. 2. Chronic microvascular ischemia and old right corona radiata lacunar infarct. 3. Loss of the normal left vertebral artery flow void, consistent with the severely diminutive left vertebral artery demonstrated on the earlier CTA.  12/20 4:10 PM 1. No acute intracranial abnormality identified. No evidence for acute infarct or hemorrhage status post tPA administration. No imaging findings to suggest acute CNS infection. 2. Stable atrophy with chronic small vessel ischemic disease with old right corona radiata lacunar infarct. 3. Right mastoid effusion.  TTE with Bubble Study: - Left ventricle: The cavity size was normal. Systolic function was   normal. The estimated ejection fraction was in the range of 50%   to 55%. Hypokinesis of the apical myocardium. Doppler parameters   are consistent with abnormal left ventricular relaxation (grade 1   diastolic dysfunction). Doppler parameters are consistent with   high ventricular filling pressure. - Aortic valve:  Transvalvular velocity was within the normal range.   There was no stenosis. There was no regurgitation. Valve area   (VTI): 2.8 cm^2. Valve area (Vmax): 2.67 cm^2. Valve area   (Vmean): 2.38 cm^2. - Mitral valve: Transvalvular velocity was within the normal range.   There was no evidence for stenosis. There was mild regurgitation. - Left atrium: The atrium was moderately dilated. - Right ventricle: The cavity size was normal. Wall thickness was   normal. Systolic function was normal. - Right atrium: The atrium was mildly dilated. - Atrial septum: No defect or patent foramen ovale was identified. - Tricuspid valve: There was no regurgitation.  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  Review of Systems: Review of Systems  Unable to perform ROS: Patient nonverbal    Past Medical History:  Diagnosis Date  . Diabetes mellitus without complication (HCC)   . Hypertension   . Myocardial infarct [facility](HCC)     Social History   Tobacco Use  . Smoking status: Former Games developermoker  . Smokeless tobacco: Never Used  Substance Use Topics  . Alcohol use: No  . Drug use: No    Family History  Problem Relation Age of Onset  . Heart attack Father    Allergies  Allergen  Reactions  . Tape Other (See Comments)    SKIN IS VERY THIN AND TEARS AND BRUISES EASILY; Please use an alternative!!    OBJECTIVE: Blood pressure (!) 164/75, pulse 87, temperature 100.1 F (37.8 C), temperature source Axillary, resp. rate (!) 23, SpO2 99 %.  Physical Exam  Constitutional: No distress.  Elderly man, obtunded, occasionally opens eyes  HENT:  Head: Normocephalic and atraumatic.  Eyes: No scleral icterus.  Leftward gaze, does not track to the right  Neck: No tracheal deviation present.  Enlarged left thyroid lobe, no discrete palpable nodules  Cardiovascular: Normal rate and regular rhythm.  No murmur heard. Pulmonary/Chest: No stridor. No respiratory distress. He has no wheezes.  He has no rales.  Abdominal: Soft. Bowel sounds are normal. He exhibits no distension. There is no tenderness.  Musculoskeletal: He exhibits no edema or tenderness.  Moves LUE and LLE spontaneously. Attempts to raise LUE on command.  Neurological:  Not really following commands, attempts to lift LUE off bed. Intermittently opens eyes.  Skin: Skin is warm. He is not diaphoretic.  Multiple bruises on chest and arms. Small old healed wound right thumb.    Lab Results Lab Results  Component Value Date   WBC 8.3 11/21/2017   HGB 11.5 (L) 11/21/2017   HCT 34.3 (L) 11/21/2017   MCV 91.5 11/21/2017   PLT 212 11/21/2017    Lab Results  Component Value Date   CREATININE 1.23 11/21/2017   BUN 13 11/21/2017   NA 137 11/21/2017   K 3.9 11/21/2017   CL 103 11/21/2017   CO2 23 11/21/2017    Lab Results  Component Value Date   ALT 17 11/19/2017   AST 24 11/19/2017   ALKPHOS 65 11/19/2017   BILITOT 0.7 11/19/2017     Microbiology: Recent Results (from the past 240 hour(s))  MRSA PCR Screening     Status: None   Collection Time: 11/19/17  9:24 PM  Result Value Ref Range Status   MRSA by PCR NEGATIVE NEGATIVE Final    Comment:        The GeneXpert MRSA Assay (FDA approved for NASAL specimens only), is one component of a comprehensive MRSA colonization surveillance program. It is not intended to diagnose MRSA infection nor to guide or monitor treatment for MRSA infections.   CSF culture     Status: None (Preliminary result)   Collection Time: 11/20/17  6:46 PM  Result Value Ref Range Status   Specimen Description CSF  Final   Special Requests NONE  Final   Gram Stain   Final    WBC PRESENT, PREDOMINANTLY PMN NO ORGANISMS SEEN CYTOSPIN SMEAR    Culture NO GROWTH < 12 HOURS  Final   Report Status PENDING  Incomplete    Darreld Mclean, MD Internal Medicine PGY-3  11/21/2017, 2:46 PM

## 2017-11-21 NOTE — Progress Notes (Signed)
STROKE TEAM PROGRESS NOTE   SUBJECTIVE (INTERVAL HISTORY) His wife is at the bedside. Pt still nonverbal and not following commands but not as agitated as yesterday and calm without distress. Intermittent cough with secretions. Still has right hemiparesis. LP concerning for viral meningitis. ID consulted.   OBJECTIVE Temp:  [98.9 F (37.2 C)-101.7 F (38.7 C)] 100.1 F (37.8 C) (12/21 0800) Pulse Rate:  [74-117] 117 (12/21 0702) Cardiac Rhythm: Normal sinus rhythm (12/21 0400) Resp:  [14-25] 25 (12/21 0702) BP: (123-185)/(59-107) 142/81 (12/21 0702) SpO2:  [91 %-98 %] 91 % (12/21 0702)  Recent Labs  Lab 11/20/17 1715 11/20/17 1947 11/20/17 2322 11/21/17 0324 11/21/17 0810  GLUCAP 128* 137* 130* 103* 106*   Recent Labs  Lab 11/19/17 1620 11/20/17 0728 11/21/17 0540  NA 136 138 137  K 4.6 4.3 3.9  CL 103 104 103  CO2 26 21* 23  GLUCOSE 195* 90 104*  BUN 19 11 13   CREATININE 0.99 1.12 1.23  CALCIUM 9.5 9.2 8.8*   Recent Labs  Lab 11/19/17 1620  AST 24  ALT 17  ALKPHOS 65  BILITOT 0.7  PROT 6.7  ALBUMIN 4.2   Recent Labs  Lab 11/19/17 1620 11/20/17 0728 11/21/17 0540  WBC 6.5 8.3 8.3  NEUTROABS 4.8  --   --   HGB 11.7* 11.6* 11.5*  HCT 35.6* 34.9* 34.3*  MCV 94.3 92.1 91.5  PLT 240 232 212   Recent Labs  Lab 11/19/17 1620  TROPONINI 0.05*   Recent Labs    11/19/17 1620  LABPROT 13.5  INR 1.04   Recent Labs    11/20/17 1815  COLORURINE YELLOW  LABSPEC 1.019  PHURINE 7.0  GLUCOSEU NEGATIVE  HGBUR NEGATIVE  BILIRUBINUR NEGATIVE  KETONESUR 20*  PROTEINUR 100*  NITRITE NEGATIVE  LEUKOCYTESUR NEGATIVE       Component Value Date/Time   CHOL 129 11/20/2017 0559   TRIG 56 11/20/2017 0559   HDL 71 11/20/2017 0559   CHOLHDL 1.8 11/20/2017 0559   VLDL 11 11/20/2017 0559   LDLCALC 47 11/20/2017 0559   Lab Results  Component Value Date   HGBA1C 7.3 (H) 11/20/2017      Component Value Date/Time   LABOPIA NONE DETECTED 11/20/2017 1055    COCAINSCRNUR NONE DETECTED 11/20/2017 1055   LABBENZ NONE DETECTED 11/20/2017 1055   AMPHETMU NONE DETECTED 11/20/2017 1055   THCU NONE DETECTED 11/20/2017 1055   LABBARB NONE DETECTED 11/20/2017 1055    No results for input(s): ETH in the last 168 hours.  I have personally reviewed the radiological images below and agree with the radiology interpretations.  Ct Angio Head and neck and CTP W Or Wo Contrast 11/19/2017 IMPRESSION: CT HEAD: 1. No acute intracranial process. 2. Old RIGHT basal ganglia lacunar infarct, otherwise negative noncontrast CT HEAD for age. CTA NECK: 1. Atherosclerosis without hemodynamically significant stenosis. 2. Severe stenosis versus occluded LEFT vertebral artery origin with immediate reconstitution, diminutive LEFT vertebral artery. 3. **An incidental finding of potential clinical significance has been found. Multiple LEFT upper lobe pulmonary nodules including 10 mm spiculated nodule. Recommend contrast-enhanced CT chest on a nonemergent basis to assess for extent of involvement. ** CTA HEAD: 1. No emergent large vessel occlusion or severe stenosis anterior circulation. 2. Severe stenosis versus tandem occlusion diminutive LEFT vertebral artery. RIGHT vertebral artery is dominant. CT PERFUSION: 1. Small perfusion defect LEFT parietal lobe/posterior watershed territory suggesting brain at risk. Acute findings discussed with and reconfirmed by Dr.Lindzen, Neurology on 11/19/2017 at 6:35  pm. Aortic Atherosclerosis (ICD10-I70.0) and Emphysema (ICD10-J43.9).   Mr Brain Wo Contrast 11/20/2017 IMPRESSION: 1. No acute intracranial abnormality. 2. Chronic microvascular ischemia and old right corona radiata lacunar infarct. 3. Loss of the normal left vertebral artery flow void, consistent with the severely diminutive left vertebral artery demonstrated on the earlier CTA. Electronically Signed   By: Deatra RobinsonKevin  Herman M.D.   On: 11/20/2017 04:16   Ct Head Code Stroke Wo  Contrast 11/19/2017 IMPRESSION: 1. No evidence of acute intracranial abnormality. 2. ASPECTS is 10. 3. Mild chronic small vessel ischemic disease and cerebral atrophy. Chronic right basal ganglia lacunar infarct.   EEG: This EEG is abnormal due to diffuse slowing of the waking background. Clinical Correlation of the above findings indicates diffuse cerebral dysfunction that is non-specific in etiology and can be seen with hypoxic/ischemic injury, toxic/metabolic encephalopathies, neurodegenerative disorders, or medication effect.  However, findings may also be due to excessive drowsiness.  Clinical correlation advised.  Mr Laqueta JeanBrain W Wo Contrast 11/20/2017 IMPRESSION: 1. No acute intracranial abnormality identified. No evidence for acute infarct or hemorrhage status post tPA administration. No imaging findings to suggest acute CNS infection. 2. Stable atrophy with chronic small vessel ischemic disease with old right corona radiata lacunar infarct. 3. Right mastoid effusion.   Dg Chest Port 1 View 11/20/2017 IMPRESSION: Small area of infiltrate or increased atelectasis in the left lung base, new since the prior CT scan.   TTE  - Left ventricle: The cavity size was normal. Systolic function was   normal. The estimated ejection fraction was in the range of 50%   to 55%. Hypokinesis of the apical myocardium. Doppler parameters   are consistent with abnormal left ventricular relaxation (grade 1   diastolic dysfunction). Doppler parameters are consistent with   high ventricular filling pressure. - Aortic valve: Transvalvular velocity was within the normal range.   There was no stenosis. There was no regurgitation. Valve area   (VTI): 2.8 cm^2. Valve area (Vmax): 2.67 cm^2. Valve area   (Vmean): 2.38 cm^2. - Mitral valve: Transvalvular velocity was within the normal range.   There was no evidence for stenosis. There was mild regurgitation. - Left atrium: The atrium was moderately dilated. - Right  ventricle: The cavity size was normal. Wall thickness was   normal. Systolic function was normal. - Right atrium: The atrium was mildly dilated. - Atrial septum: No defect or patent foramen ovale was identified. - Tricuspid valve: There was no regurgitation.  PHYSICAL EXAM  Temp:  [98.9 F (37.2 C)-101.7 F (38.7 C)] 100.1 F (37.8 C) (12/21 0800) Pulse Rate:  [74-117] 117 (12/21 0702) Resp:  [14-25] 25 (12/21 0702) BP: (123-185)/(59-107) 142/81 (12/21 0702) SpO2:  [91 %-98 %] 91 % (12/21 0702)  General - Well nourished, well developed, still obtunded and not following commands.  Ophthalmologic - fundi not visualized due to noncooperation.  Cardiovascular - Regular rate and rhythm.  Neuro - obtunded, not following commands. Eyes closed, but able to open with voice. Nonverbal, not able to name or repeat, not following commands. Inconsistently blinking to visual threat bilaterally. Eyes mainly natural position, not movement bilaterally as command, doll's eye present. PREEL. Positive corneal and gag. LUE and LLE move spontaneously and against gravity, RUE 2/5 and RLE 3/5 on pain. DTR 1+ and not cooperative on babinski. Sensation, coordination and gait not tested.   ASSESSMENT/PLAN Mr. Rosana FretRalph Crisanti is a 83 y.o. male with history of DM, HTN, MI s/p CABG, PVD, HLD on lovastatin admitted for AMS,  confusion, aphasia, wax and waning process. TPA given at OSH.    AMS with intermittent fever - Seizure with Todd paralysis vs. CNS infection - presentation concerning for seizure like activity and CSF concerning for viral meningitis    Resultant AMS, nonverbal, agitation, left hemiparesis  MRI  No acute stroke  MRI brain repeat with and without contrast negative  CTA head and neck b/l ICA proximal and right VA origin athero, left VA hypoplastic  2D Echo EF 50-55%  EEG - diffuse slowing no seizure  TSH/B12 and ammonia WNL  LP showed elevated RBC, WBC and protein, concerning for viral  meningitis. ID consulted  Blood culture pending  Repeat EEG pending  LDL 47  HgbA1c 7.3  SCDs for VTE prophylaxis  Diet NPO time specified   aspirin 81 mg daily prior to admission, now on No antithrombotic  Therapy recommendations:  pending  Disposition:  Pending  ? pneumonia   CXR showed new small area of infiltration left lung base  Repeat CXR pending  Has cough with secretion  Intermittent fever  Diabetes  HgbA1c 7.3 goal < 7.0  Uncontrolled  Home meds -glyburide  CBG monitoring  SSI  Hypertension Stable  Long term BP goal normotensive  Hyperlipidemia  Home meds:  lovastatin   LDL 47, goal < 70  Resume once po access  Continue statin at discharge  Other Stroke Risk Factors  Advanced age  CAD/MI s/p CABG  PVD  Other Active Problems  Tmax - 100.9  Hospital day # 2  This patient is critically ill due to AMS, s/p tPA, agitation and at significant risk of neurological worsening, death form stroke, hemorrhage, seizure, status epilepticus, sepsis. This patient's care requires constant monitoring of vital signs, hemodynamics, respiratory and cardiac monitoring, review of multiple databases, neurological assessment, discussion with family, other specialists and medical decision making of high complexity. I had long discussion with wife at bedside, updated pt current condition, treatment plan and potential prognosis. She expressed understanding and appreciation. I also discussed with Dr. Daiva Eves over the phone. I spent 45 minutes of neurocritical care time in the care of this patient.  Marvel Plan, MD PhD Stroke Neurology 11/21/2017 9:12 AM    To contact Stroke Continuity provider, please refer to [email or web address]. After hours, contact General Neurology

## 2017-11-21 NOTE — Progress Notes (Signed)
Called MRI to see when Mr Peter Becker can come down for MRI neck. MRI informed me that pt must wait 48 hr since last contrast which was yesterday 1600. Dr. Daiva EvesVan Dam paged, Dr Roda ShuttersXu paged to notify.

## 2017-11-22 ENCOUNTER — Inpatient Hospital Stay (HOSPITAL_COMMUNITY): Payer: Medicare Other

## 2017-11-22 DIAGNOSIS — I739 Peripheral vascular disease, unspecified: Secondary | ICD-10-CM

## 2017-11-22 DIAGNOSIS — R29818 Other symptoms and signs involving the nervous system: Secondary | ICD-10-CM

## 2017-11-22 DIAGNOSIS — I25810 Atherosclerosis of coronary artery bypass graft(s) without angina pectoris: Secondary | ICD-10-CM

## 2017-11-22 DIAGNOSIS — D62 Acute posthemorrhagic anemia: Secondary | ICD-10-CM

## 2017-11-22 DIAGNOSIS — Z227 Latent tuberculosis: Secondary | ICD-10-CM

## 2017-11-22 DIAGNOSIS — E119 Type 2 diabetes mellitus without complications: Secondary | ICD-10-CM

## 2017-11-22 DIAGNOSIS — R7611 Nonspecific reaction to tuberculin skin test without active tuberculosis: Secondary | ICD-10-CM

## 2017-11-22 DIAGNOSIS — R410 Disorientation, unspecified: Secondary | ICD-10-CM

## 2017-11-22 DIAGNOSIS — I10 Essential (primary) hypertension: Secondary | ICD-10-CM

## 2017-11-22 LAB — CBC
HEMATOCRIT: 32.6 % — AB (ref 39.0–52.0)
HEMOGLOBIN: 10.9 g/dL — AB (ref 13.0–17.0)
MCH: 30.7 pg (ref 26.0–34.0)
MCHC: 33.4 g/dL (ref 30.0–36.0)
MCV: 91.8 fL (ref 78.0–100.0)
Platelets: 212 10*3/uL (ref 150–400)
RBC: 3.55 MIL/uL — ABNORMAL LOW (ref 4.22–5.81)
RDW: 12.9 % (ref 11.5–15.5)
WBC: 8 10*3/uL (ref 4.0–10.5)

## 2017-11-22 LAB — HIV ANTIBODY (ROUTINE TESTING W REFLEX): HIV SCREEN 4TH GENERATION: NONREACTIVE

## 2017-11-22 LAB — BASIC METABOLIC PANEL
Anion gap: 9 (ref 5–15)
BUN: 15 mg/dL (ref 6–20)
CO2: 22 mmol/L (ref 22–32)
CREATININE: 1.17 mg/dL (ref 0.61–1.24)
Calcium: 8.3 mg/dL — ABNORMAL LOW (ref 8.9–10.3)
Chloride: 107 mmol/L (ref 101–111)
GFR calc non Af Amer: 55 mL/min — ABNORMAL LOW (ref >60)
Glucose, Bld: 97 mg/dL (ref 65–99)
Potassium: 3.9 mmol/L (ref 3.5–5.1)
Sodium: 138 mmol/L (ref 135–145)

## 2017-11-22 LAB — GLUCOSE, CAPILLARY
GLUCOSE-CAPILLARY: 108 mg/dL — AB (ref 65–99)
GLUCOSE-CAPILLARY: 125 mg/dL — AB (ref 65–99)
GLUCOSE-CAPILLARY: 157 mg/dL — AB (ref 65–99)
GLUCOSE-CAPILLARY: 166 mg/dL — AB (ref 65–99)
GLUCOSE-CAPILLARY: 99 mg/dL (ref 65–99)
Glucose-Capillary: 104 mg/dL — ABNORMAL HIGH (ref 65–99)

## 2017-11-22 LAB — RPR: RPR Ser Ql: NONREACTIVE

## 2017-11-22 LAB — TROPONIN I: Troponin I: 0.12 ng/mL (ref <0.03)

## 2017-11-22 LAB — HERPES SIMPLEX VIRUS(HSV) DNA BY PCR
HSV 1 DNA: NEGATIVE
HSV 2 DNA: NEGATIVE

## 2017-11-22 LAB — VALPROIC ACID LEVEL: Valproic Acid Lvl: 55 ug/mL (ref 50.0–100.0)

## 2017-11-22 LAB — PROCALCITONIN

## 2017-11-22 MED ORDER — JEVITY 1.2 CAL PO LIQD
1000.0000 mL | ORAL | Status: DC
  Administered 2017-11-22 – 2017-11-23 (×2)
  Administered 2017-11-24: 1000 mL
  Administered 2017-11-25: 23:00:00
  Administered 2017-11-25: 1000 mL
  Filled 2017-11-22 (×9): qty 1000

## 2017-11-22 MED ORDER — GADOBENATE DIMEGLUMINE 529 MG/ML IV SOLN
10.0000 mL | Freq: Once | INTRAVENOUS | Status: AC
  Administered 2017-11-22: 10 mL via INTRAVENOUS

## 2017-11-22 NOTE — Progress Notes (Signed)
Initial Nutrition Assessment  DOCUMENTATION CODES:  Not applicable  INTERVENTION:  Initiate TF via Cortrak with Jevity 1.2 at goal rate of 60 ml/h (1440 ml per day)to provide 1728 kcals, 80 gm protein, 1162 ml free water daily.  NUTRITION DIAGNOSIS:  Inadequate oral intake related to lethargy/confusion as evidenced by NPO status.  GOAL:  Patient will meet greater than or equal to 90% of their needs  MONITOR:  Supplement acceptance, Diet advancement, Vent status, Weight trends, TF tolerance  REASON FO/R ASSESSMENT:  Consult Enteral/tube feeding initiation and management  ASSESSMENT:  Pt with PMH of DM, HTN, PAD, CAD s/p CABG admitted with aphasia, [facility] and received [facility]PA.   RD consulted for cortrak placement as patient is still lethargic and unable to consume safely by mouth.   Cortrak placed. Per monitor, Tip is near LOT, approximately D4. Tube bridled at ~98 cm mark  Wife at bedside gave UBW range 120-132 lbs. She denied the patient having any periods of poor intake PTA. He did not follow a diabetic diet. He did not drink supplements. She says he was "always" going to the bathroom, both to urinate and defecate. She asked him to tell the doctor about this, but he never did.   Abdomen is soft, non distended.   Labs: H/H:10.9/32.6. Bgs have been well controlled 90-110. A1C on 12/20 was 7.3 Meds: Insulin, PPI  Recent Labs  Lab 11/20/17 0728 11/21/17 0540 11/22/17 0514  NA 138 137 138  K 4.3 3.9 3.9  CL 104 103 107  CO2 21* 23 22  BUN 11 13 15   CREATININE 1.12 1.23 1.17  CALCIUM 9.2 8.8* 8.3*  GLUCOSE 90 104* 97   Diet Order:  Diet NPO time specified  EDUCATION NEEDS:  No education needs have been identified at this time  Skin:  Skin Assessment: Reviewed RN Assessment  Last BM:  12/22  Height:  Ht Readings from Last 1 Encounters:  11/22/17 5\' 7"  (1.702 m)   Weight:  Wt Readings from Last 1 Encounters:  11/22/17 124 lb (56.2 kg)   Wt Readings  from Last 10 Encounters:  11/22/17 124 lb (56.2 kg)  11/19/17 124 lb (56.2 kg)  01/25/17 134 lb (60.8 kg)  01/20/17 134 lb (60.8 kg)   Ideal Body Weight:  67.27 kg  BMI:  Body mass index is 19.42 kg/m.  Estimated Nutritional Needs:  Kcal:  1700-1900 (32-34 kcal/kg bw) Protein:  80-90g Pro (1.2-1.4 g/kg bw) Fluid:  1.7-1.9 L fluid ( 431ml/kcal)  Christophe LouisNathan Erma Joubert RD, LDN, CNSC Clinical Nutrition Pager: [phone] 11/22/2017 11:34 AM

## 2017-11-22 NOTE — Progress Notes (Signed)
OT Cancellation Note  Patient Details Name: Peter Becker MRN: [id number] DOB: 1929/03/28   Cancelled Treatment:    Reason Eval/Treat Not Completed: Medical issues which prohibited therapy (pt remains on strict bedrest)    Evette GeorgesLeonard, Meric Joye Eva [phone] 11/22/2017, 9:52 AM

## 2017-11-22 NOTE — Progress Notes (Signed)
INFECTIOUS DISEASE PROGRESS NOTE  ID: Peter FretRalph Scharnhorst is a 83 y.o. male with  Active Problems:   Ischemic stroke (HCC)   Altered mental status   Cough   Meningoencephalitis   Seizure (HCC)   FUO (fever of unknown origin)  Subjective: Fatigued per wife after walking around unit  Abtx:  Anti-infectives (From admission, onward)   Start     Dose/Rate Route Frequency Ordered Stop   11/20/17 2200  acyclovir (ZOVIRAX) 560 mg in dextrose 5 % 100 mL IVPB     10 mg/kg  56.2 kg 111.2 mL/hr over 60 Minutes Intravenous Every 12 hours 11/20/17 2118        Medications:  Scheduled: . chlorhexidine  15 mL Mouth Rinse BID  . insulin aspart  0-9 Units Subcutaneous Q4H  . mouth rinse  15 mL Mouth Rinse q12n4p  . pantoprazole (PROTONIX) IV  40 mg Intravenous QHS    Objective: Vital signs in last 24 hours: Temp:  [98.2 F (36.8 C)-100.8 F (38.2 C)] 98.4 F (36.9 C) (12/22 1200) Pulse Rate:  [80-97] 88 (12/22 1200) Resp:  [16-22] 19 (12/22 1200) BP: (123-190)/(63-109) 154/79 (12/22 1200) SpO2:  [94 %-100 %] 97 % (12/22 1200) Weight:  [56.2 kg (124 lb)] 56.2 kg (124 lb) (12/22 1118)   General appearance: fatigued Neck: no lymph nodes. no pain with movement.  Resp: clear to auscultation bilaterally Cardio: regular rate and rhythm GI: normal findings: bowel sounds normal and soft, non-tender  Lab Results Recent Labs    11/21/17 0540 11/22/17 0514  WBC 8.3 8.0  HGB 11.5* 10.9*  HCT 34.3* 32.6*  NA 137 138  K 3.9 3.9  CL 103 107  CO2 23 22  BUN 13 15  CREATININE 1.23 1.17   Liver Panel Recent Labs    11/19/17 1620  PROT 6.7  ALBUMIN 4.2  AST 24  ALT 17  ALKPHOS 65  BILITOT 0.7   Sedimentation Rate No results for input(s): ESRSEDRATE in the last 72 hours. C-Reactive Protein No results for input(s): CRP in the last 72 hours.  Microbiology: Recent Results (from the past 240 hour(s))  MRSA PCR Screening     Status: None   Collection Time: 11/19/17  9:24 PM    Result Value Ref Range Status   MRSA by PCR NEGATIVE NEGATIVE Final    Comment:        The GeneXpert MRSA Assay (FDA approved for NASAL specimens only), is one component of a comprehensive MRSA colonization surveillance program. It is not intended to diagnose MRSA infection nor to guide or monitor treatment for MRSA infections.   Culture, blood (routine x 2)     Status: None (Preliminary result)   Collection Time: 11/20/17 11:05 AM  Result Value Ref Range Status   Specimen Description BLOOD LEFT ANTECUBITAL  Final   Special Requests IN PEDIATRIC BOTTLE Blood Culture adequate volume  Final   Culture NO GROWTH 1 DAY  Final   Report Status PENDING  Incomplete  Culture, blood (routine x 2)     Status: None (Preliminary result)   Collection Time: 11/20/17 11:10 AM  Result Value Ref Range Status   Specimen Description BLOOD LEFT ANTECUBITAL  Final   Special Requests IN PEDIATRIC BOTTLE Blood Culture adequate volume  Final   Culture NO GROWTH 1 DAY  Final   Report Status PENDING  Incomplete  CSF culture     Status: None (Preliminary result)   Collection Time: 11/20/17  6:46 PM  Result Value  Ref Range Status   Specimen Description CSF  Final   Special Requests NONE  Final   Gram Stain   Final    WBC PRESENT, PREDOMINANTLY PMN NO ORGANISMS SEEN CYTOSPIN SMEAR    Culture NO GROWTH 2 DAYS  Final   Report Status PENDING  Incomplete  Anaerobic culture     Status: None (Preliminary result)   Collection Time: 11/20/17  6:46 PM  Result Value Ref Range Status   Specimen Description CSF  Final   Special Requests NONE  Final   Culture   Final    NO ANAEROBES ISOLATED; CULTURE IN PROGRESS FOR 5 DAYS   Report Status PENDING  Incomplete    Studies/Results: [name] Contrast  Result Date: 11/20/2017 CLINICAL DATA:  Initial evaluation for follow-up stroke, status post tPA. EXAM: MRI HEAD WITHOUT AND WITH CONTRAST TECHNIQUE: Multiplanar, multiecho pulse sequences of the brain and  surrounding structures were obtained without and with intravenous contrast. CONTRAST:  10mL MULTIHANCE GADOBENATE DIMEGLUMINE 529 MG/ML IV SOLN COMPARISON:  Prior CT from 11/19/2017 as well as previous MRI from earlier the same day. FINDINGS: Brain: Generalized age related cerebral atrophy. Patchy confluent T2/FLAIR hyperintensity within the periventricular white matter, most consistent with chronic small vascular disease. Remote lacunar infarct again noted within the anterior right corona radiata. No abnormal foci of restricted diffusion to suggest acute or subacute ischemia. Gray-white matter differentiation maintained. No evidence for acute intracranial hemorrhage status post tPA administration. No mass lesion, midline shift or mass effect. Ventricles stable in size without hydrocephalus. No extra-axial fluid collection. Major dural sinuses are grossly patent. No abnormal enhancement. Apparent irregular enhancement within the right cerebellar hemisphere on axial post-contrast imaging felt to be most consistent with artifact, and is not seen on corresponding coronal postcontrast sequence. No imaging findings to suggest acute CNS infection. Pituitary and suprasellar region grossly normal. Vascular: Hypoplastic left vertebral artery again noted. Major intravascular flow voids otherwise maintained. Skull and upper cervical spine: Craniocervical junction grossly normal. Bone marrow signal intensity within normal limits. No appreciable scalp soft tissue abnormality. Sinuses/Orbits: Globes and oval soft tissues within normal limits. Patient status post lens extraction bilaterally. Scattered mucosal thickening noted within the ethmoidal air cells and maxillary sinuses. Paranasal sinuses are otherwise clear. No air-fluid level to suggest acute sinusitis. Right mastoid effusion noted, stable. Inner ear structures normal. Other: None. IMPRESSION: 1. No acute intracranial abnormality identified. No evidence for acute infarct  or hemorrhage status post tPA administration. No imaging findings to suggest acute CNS infection. 2. Stable atrophy with chronic small vessel ischemic disease with old right corona radiata lacunar infarct. 3. Right mastoid effusion. Electronically Signed   By: Rise Mu M.D.   On: 11/20/2017 18:36   Dg Chest Port 1 View  Result Date: 11/21/2017 CLINICAL DATA:  Coughing. EXAM: PORTABLE CHEST 1 VIEW COMPARISON:  11/20/2017 FINDINGS: Stable changes from prior cardiac surgery. Cardiac silhouette is mildly enlarged. No mediastinal or hilar masses. Opacities noted at the medial left lung base similar to the prior exam. This may reflect atelectasis or pneumonia. Mild chronic scarring in the left upper lobe. Remainder of the lungs is clear. No pleural effusion or pneumothorax. IMPRESSION: 1. No significant change from the previous day's study. 2. Left medial lung base opacity is again noted consistent with pneumonia or atelectasis. Electronically Signed   By: Amie Portland M.D.   On: 11/21/2017 09:30   Dg Chest Port 1 View  Result Date: 11/20/2017 CLINICAL DATA:  Fever. EXAM:  PORTABLE CHEST 1 VIEW COMPARISON:  CT scan of the chest dated 09/22/2017 and chest x-ray dated 03/06/2015 FINDINGS: There is a small area of increased density at the left lung base medially, new since the prior exam. There was an area of slight atelectasis at that site on the prior CT scan. Calcified granulomas in the left lung apex, unchanged. The lungs are otherwise clear. Heart size and pulmonary vascularity are normal. CABG. No acute bone abnormality. IMPRESSION: Small area of infiltrate or increased atelectasis in the left lung base, new since the prior CT scan. Electronically Signed   By: Francene BoyersJames  Maxwell M.D.   On: 11/20/2017 15:17     Assessment/Plan: Hypoperfusion without infarct? Mental Status change with neuro deficits Encephalitis?  Total days of antibiotics: day 2 acyclovir  Enterovirus, VZV pending Agree  with Dr Daiva EvesVan Dam- no recent shingles (confimed with wife) HSV (-) Will stop acyclovir CSF Cx is ngtd Await C-spine MRI (tomorrow) Available as needed.          Johny SaxJeffrey Hatcher MD, FACP Infectious Diseases (pager) [phone] [email or web address] 11/22/2017, 12:46 PM  LOS: 3 days

## 2017-11-22 NOTE — Evaluation (Addendum)
Occupational Therapy Evaluation Patient Details Name: Peter Becker MRN: [id number] DOB: 09-Aug-1929 Today's Date: 11/22/2017    History of Present Illness Peter Becker is an 83 y.o. male with past medical history of diabetes, hypertension, peripheral arterial disease, coronary artery disease who presents to [facility] hospital with sudden onset aphasia and right sided weakness, CT and MRI negative. LP concerning for viral menigitis   Clinical Impression   This 83 yo male admitted with above presents to acute OT with decreased balance (sitting and standing); inattention to right side, decreased command following, decreased awareness of deficits, decreased ability of eyes of go past midline to right all affecting his PLOF of being totally independent with all basic ADLs and most IADLs. He will benefit from acute OT with follow up OT on CIR to work back towards PLOF.    Follow Up Recommendations  CIR;Supervision/Assistance - 24 hour    Equipment Recommendations  Other (comment)(TBD next venue)    Recommendations for Other Services Rehab consult     Precautions / Restrictions Precautions Precautions: Fall Restrictions Weight Bearing Restrictions: No      Mobility Bed Mobility Overal bed mobility: Needs Assistance Bed Mobility: Rolling;Sidelying to Sit Rolling: (min A to right, Mod A to left) Sidelying to sit: Max assist;+2 for physical assistance;HOB elevated       General bed mobility comments: min A to right, Mod A to left  Transfers Overall transfer level: Needs assistance Equipment used: 2 person hand held assist Transfers: Sit to/from [employer] Sit to Stand: Min assist;Mod assist;+2 physical assistance Stand pivot transfers: Mod assist;+2 physical assistance       General transfer comment: Min A +2 from bed; Mod A +2 from recliner    Balance Overall balance assessment: Needs assistance Sitting-balance support: Single extremity supported;Feet  supported Sitting balance-Leahy Scale: Poor Sitting balance - Comments: reliant on single UE support with at times no UE support   Standing balance support: Bilateral upper extremity supported Standing balance-Leahy Scale: Poor Standing balance comment: Reliant on Bil UE HHA; pt with right lateral lean which increased with more time up on his feet                           ADL either performed or assessed with clinical judgement   ADL Overall ADL's : Needs assistance/impaired Eating/Feeding: NPO   Grooming: Wash/dry face;Moderate assistance Grooming Details (indicate cue type and reason): supported sitting and once help to initiate Upper Body Bathing: Total assistance Upper Body Bathing Details (indicate cue type and reason): supported sitting  Lower Body Bathing: Total assistance Lower Body Bathing Details (indicate cue type and reason): +2 min/mod A sit<>stand Upper Body Dressing : Total assistance Upper Body Dressing Details (indicate cue type and reason): supported sitting    Lower Body Dressing Details (indicate cue type and reason): +2 min/mod A sit<>stand Toilet Transfer: +2 for physical assistance;Moderate assistance;Stand-pivot Toilet Transfer Details (indicate cue type and reason): bed>recliner Toileting- Clothing Manipulation and Hygiene: Total assistance Toileting - Clothing Manipulation Details (indicate cue type and reason): +2 min/mod A sit<>stand             Vision Baseline Vision/History: Wears glasses Wears Glasses: Reading only Additional Comments: head and eyes to right; can come to midline with cues and increased time            Pertinent Vitals/Pain Pain Assessment: Faces Pain Score: 0-No pain     Hand Dominance Right   Extremity/Trunk Assessment  Upper Extremity Assessment Upper Extremity Assessment: RUE deficits/detail RUE Deficits / Details: No AROM noted           Communication Communication Communication: Expressive  difficulties;Receptive difficulties   Cognition Arousal/Alertness: Awake/alert Behavior During Therapy: Flat affect Overall Cognitive Status: Impaired/Different from baseline Area of Impairment: Following commands;Safety/judgement;Awareness;Problem solving;Attention                   Current Attention Level: Sustained   Following Commands: Follows one step commands inconsistently;Follows one step commands with increased time Safety/Judgement: Decreased awareness of safety;Decreased awareness of deficits Awareness: Intellectual Problem Solving: Slow processing;Decreased initiation;Difficulty sequencing;Requires verbal cues;Requires tactile cues General Comments: Right inattention              Home Living Family/patient expects to be discharged to:: Inpatient rehab Living Arrangements: Spouse/significant other Available Help at Discharge: Family Type of Home: House Home Access: Stairs to enter Entergy CorporationEntrance Stairs-Number of Steps: 2   Home Layout: One level     Bathroom Shower/Tub: Chief Strategy OfficerTub/shower unit   Bathroom Toilet: Standard     Home Equipment: Environmental consultantWalker - 4 wheels;Shower seat      Lives With: Spouse    Prior Functioning/Environment Level of Independence: Independent        Comments: really enjoys cross-stitch        OT Problem List: Decreased strength;Decreased range of motion;Impaired balance (sitting and/or standing);Impaired UE functional use;Decreased cognition;Decreased coordination;Impaired vision/perception;Decreased knowledge of use of DME or AE;Impaired tone      OT Treatment/Interventions: Self-care/ADL training;Balance training;Therapeutic exercise;Therapeutic activities;Cognitive remediation/compensation;DME and/or AE instruction;Visual/perceptual remediation/compensation;Patient/family education    OT Goals(Current goals can be found in the care plan section) Acute Rehab OT Goals Patient Stated Goal: pt unable, wife would like him home OT Goal  Formulation: With family Time For Goal Achievement: 12/06/17 Potential to Achieve Goals: Good ADL Goals Pt Will Perform Grooming: with min assist Pt Will Perform Upper Body Bathing: with mod assist Pt Will Transfer to Toilet: with mod assist;bedside commode Additional ADL Goal #1: Pt will be Mod A to come up to sit on right or left side of bed Additional ADL Goal #2: Pt will be able to attend visually past midline to right with min VCs Additional ADL Goal #3: Pt will be min guard A sitting EOB for 3 minutes in prep for basic ADLs and transfers  OT Frequency: Min 3X/week              AM-PAC PT "6 Clicks" Daily Activity     Outcome Measure Help from another person eating meals?: Total Help from another person taking care of personal grooming?: Total Help from another person toileting, which includes using toliet, bedpan, or urinal?: Total Help from another person bathing (including washing, rinsing, drying)?: Total Help from another person to put on and taking off regular upper body clothing?: Total Help from another person to put on and taking off regular lower body clothing?: Total 6 Click Score: 6   End of Session Equipment Utilized During Treatment: Gait belt Nurse Communication: Mobility status  Activity Tolerance: Patient tolerated treatment well Patient left: in chair;with call bell/phone within reach;with chair alarm set;with family/visitor present;with restraints reapplied(left mitt)  OT Visit Diagnosis: Unsteadiness on feet (R26.81);Other abnormalities of gait and mobility (R26.89);Muscle weakness (generalized) (M62.81);Low vision, both eyes (H54.2);Cognitive communication deficit (R41.841);Hemiplegia and hemiparesis Hemiplegia - Right/Left: Right Hemiplegia - dominant/non-dominant: Dominant                Time: [id number] OT Time Calculation (min): 36  min Charges:  OT General Charges $OT Visit: 1 Visit OT Evaluation $OT Eval Moderate Complexity: [address] Mod Cathy Rufino [place],  [place] [phone] 11/22/2017

## 2017-11-22 NOTE — Evaluation (Signed)
Physical Therapy Evaluation Patient Details Name: Peter Becker MRN: [id number] DOB: 04-12-1929 Today's Date: 11/22/2017   History of Present Illness  Peter Becker is an 83 y.o. male with past medical history of diabetes, hypertension, peripheral arterial disease, coronary artery disease who presents to [facility] hospital with sudden onset aphasia and right sided weakness, CT and MRI negative. LP concerning for viral menigitis  Clinical Impression  Pt with flat affect, left gaze preference and no movement of RUE throughout session. Pt incontinent of stool on arrival and required assist to roll and perform pericare. With cues for transition to sitting and standing but assisting and able to perform sitting, standing and gait. Pt with decreased strength, balance, function, transfers, gait and safety who will benefit from acute therapy to maximize mobility, balance and functional activity to decrease burden of care.     Follow Up Recommendations CIR;Supervision/Assistance - 24 hour    Equipment Recommendations  Other (comment)(TBD with progression)    Recommendations for Other Services       Precautions / Restrictions Precautions Precautions: Fall Restrictions Weight Bearing Restrictions: No      Mobility  Bed Mobility Overal bed mobility: Needs Assistance Bed Mobility: Rolling;Sidelying to Sit Rolling: Min assist;Mod assist Sidelying to sit: Max assist;+2 for physical assistance;HOB elevated       General bed mobility comments: min A to roll right, Mod A to roll left. Assist to bring legs off of bed and elevate trunk to pivot to EOB  Transfers Overall transfer level: Needs assistance Equipment used: 2 person hand held assist Transfers: Sit to/from [employer] Sit to Stand: Min assist;Mod assist;+2 physical assistance Stand pivot transfers: Mod assist;+2 physical assistance       General transfer comment: Min A +2 to stand from bed; Mod A +2 to stand from  recliner. Mod+2 with assist to initiate weight shift with cues for posture and sequence to pivot to chair with pt advancing feet  Ambulation/Gait Ambulation/Gait assistance: Max assist;+2 physical assistance Ambulation Distance (Feet): 30 Feet Assistive device: 2 person hand held assist Gait Pattern/deviations: Narrow base of support;Step-to pattern;Decreased stance time - right   Gait velocity interpretation: Below normal speed for age/gender General Gait Details: pt with narrow BOS with right lean with initial mod assist for gait with assist for left translation, weight shift and balance with chair to follow. With increased distance and pt fatigue demonstrating increased right lean with max assist +2 for balance and chair pulled to pt. Pt with decreased stance on RLE and control of stepping  Stairs            Wheelchair Mobility    Modified Rankin (Stroke Patients Only) Modified Rankin (Stroke Patients Only) Pre-Morbid Rankin Score: No symptoms Modified Rankin: Severe disability     Balance Overall balance assessment: Needs assistance Sitting-balance support: Single extremity supported;Feet supported Sitting balance-Leahy Scale: Poor Sitting balance - Comments: reliant on single UE support with at times no UE support   Standing balance support: Bilateral upper extremity supported Standing balance-Leahy Scale: Poor Standing balance comment: Reliant on Bil UE HHA; pt with right lateral lean which increased with more time up on his feet                             Pertinent Vitals/Pain Pain Assessment: Faces Pain Score: 0-No pain Pain Location: pt unable to state or localize,  without grimace or signs of pain    Home Living Family/patient expects  to be discharged to:: Inpatient rehab Living Arrangements: Spouse/significant other Available Help at Discharge: Family;Available 24 hours/day Type of Home: House Home Access: Stairs to enter   ITT IndustriesEntrance  Stairs-Number of Steps: 2 Home Layout: One level Home Equipment: Walker - 4 wheels;Shower seat      Prior Function Level of Independence: Independent         Comments: really enjoys cross-stitch     Hand Dominance   Dominant Hand: Right    Extremity/Trunk Assessment   Upper Extremity Assessment Upper Extremity Assessment: Defer to OT evaluation RUE Deficits / Details: No AROM noted    Lower Extremity Assessment Lower Extremity Assessment: RLE deficits/detail;Difficult to assess due to impaired cognition RLE Deficits / Details: pt not following commands for movement with grossly 2+/5 strength, able to weight bear without buckling with decreased coordination    Cervical / Trunk Assessment Cervical / Trunk Assessment: Normal;Other exceptions Cervical / Trunk Exceptions: right lean in standing with greater than 10 sec standing  Communication   Communication: Expressive difficulties;Receptive difficulties  Cognition Arousal/Alertness: Awake/alert Behavior During Therapy: Flat affect Overall Cognitive Status: Impaired/Different from baseline Area of Impairment: Following commands;Safety/judgement;Awareness;Problem solving;Attention                   Current Attention Level: Sustained   Following Commands: Follows one step commands inconsistently;Follows one step commands with increased time Safety/Judgement: Decreased awareness of safety;Decreased awareness of deficits Awareness: Intellectual Problem Solving: Slow processing;Decreased initiation;Difficulty sequencing;Requires verbal cues;Requires tactile cues General Comments: Right inattention      General Comments      Exercises     Assessment/Plan    PT Assessment Patient needs continued PT services  PT Problem List Decreased strength;Decreased mobility;Decreased safety awareness;Decreased activity tolerance;Decreased range of motion;Decreased coordination;Decreased cognition;Decreased balance;Decreased  knowledge of use of DME       PT Treatment Interventions DME instruction;Therapeutic activities;Cognitive remediation;Gait training;Therapeutic exercise;Patient/family education;Balance training;Functional mobility training;Neuromuscular re-education    PT Goals (Current goals can be found in the Care Plan section)  Acute Rehab PT Goals Patient Stated Goal: pt unable, wife would like him home PT Goal Formulation: With family Time For Goal Achievement: 12/06/17 Potential to Achieve Goals: Fair    Frequency Min 3X/week   Barriers to discharge Decreased caregiver support      Co-evaluation PT/OT/SLP Co-Evaluation/Treatment: Yes Reason for Co-Treatment: Complexity of the patient's impairments (multi-system involvement);For patient/therapist safety PT goals addressed during session: Mobility/safety with mobility;Balance         AM-PAC PT "6 Clicks" Daily Activity  Outcome Measure Difficulty turning over in bed (including adjusting bedclothes, sheets and blankets)?: Unable Difficulty moving from lying on back to sitting on the side of the bed? : Unable Difficulty sitting down on and standing up from a chair with arms (e.g., wheelchair, bedside commode, etc,.)?: Unable Help needed moving to and from a bed to chair (including a wheelchair)?: A Lot Help needed walking in hospital room?: A Lot Help needed climbing 3-5 steps with a railing? : Total 6 Click Score: 8    End of Session Equipment Utilized During Treatment: Gait belt Activity Tolerance: Patient tolerated treatment well Patient left: in chair;with call bell/phone within reach;with chair alarm set;with family/visitor present Nurse Communication: Mobility status;Precautions PT Visit Diagnosis: Unsteadiness on feet (R26.81);Other abnormalities of gait and mobility (R26.89);Other symptoms and signs involving the nervous system (R29.898)    Time: [id number] PT Time Calculation (min) (ACUTE ONLY): 35 min   Charges:   PT  Evaluation $PT Eval Moderate Complexity:  1 Mod     PT G Codes:        Delaney MeigsMaija Tabor Kelise Kuch, PT [phone]   Enedina FinnerMaija B Aliyanna Wassmer 11/22/2017, 1:59 PM

## 2017-11-22 NOTE — Progress Notes (Signed)
CRITICAL VALUE ALERT  Critical Value:  Troponin 0.12 ng/mL  Date & Time Notied:  11/22/2017 0620  Provider Notified: Dr. Laurence SlateAroor  Orders Received/Actions taken: MD notified, no new orders received.

## 2017-11-22 NOTE — Progress Notes (Signed)
Rehab Admissions Coordinator Note:  Patient was screened by Peter Becker, Peter Becker for appropriateness for an Inpatient Acute Rehab Consult per OT recommendation.  At this time, we are recommending Inpatient Rehab consult.  Peter Becker, Peter Becker 11/22/2017, 1:34 PM  I can be reached at [phone].

## 2017-11-22 NOTE — Consult Note (Signed)
Physical Medicine and Rehabilitation Consult Reason for Consult: Weakness Referring Physician: Marvel PlanXu, Jindong, MD   HPI: Peter FretRalph Becker is a 83 y.o. male with past medical history of latent TB, CAD status post myocardial infarction and CABG, HTN, diabetes mellitus type 2, PAD presented on 12/19 with AMS. History taken from chart review and wife. On 1219, patient's speech became unintelligible and he was brought to the hospital. His speech improved but later he became aphasic. CT head was performed, reviewed, unremarkable for acute process. He was given TPA. CTA was also performed which did not show any large vessel occlusion. His symptoms improved again, but became worse shortly thereafter. [facility] Patient had an LP which was concerning for viral meningitis. ID was consulted, workup thus far has been negative and acyclovir DC'd on 12/22. Workup ongoing. Hospital course further complicated by acute blood loss anemia.  Of note, patient's wife states that after patient's CABG in 2007 he became aphonic for several days.   Review of Systems  Unable to perform ROS: Acuity of condition   Past Medical History:  Diagnosis Date  . Diabetes mellitus without complication (HCC)   . Hypertension   . Myocardial infarct [facility](HCC)    Past Surgical History:  Procedure Laterality Date  . CARPAL TUNNEL RELEASE Left   . CHOLECYSTECTOMY    . EYE SURGERY    . heart stent     triple bypass  . HEMORRHOID SURGERY    . TONSILLECTOMY     Family History  Problem Relation Age of Onset  . Heart attack Father    Social History:  reports that he has quit smoking. he has never used smokeless tobacco. He reports that he does not drink alcohol or use drugs. Allergies:  Allergies  Allergen Reactions  . Tape Other (See Comments)    SKIN IS VERY THIN AND TEARS AND BRUISES EASILY; Please use an alternative!!   Medications Prior to Admission  Medication Sig Dispense Refill  . aspirin 81 MG  chewable tablet Chew 81 mg by mouth daily.    . Calcium Carbonate-Vit D-Min (CALCIUM 600+D3 PLUS MINERALS PO) Take 1 tablet by mouth daily.    . chlorpheniramine (CHLOR-TRIMETON) 4 MG tablet Take 4 mg by mouth every 6 (six) hours as needed for allergies.     . Cholecalciferol (VITAMIN D3) 2000 units capsule Take 2,000 Units by mouth daily.     . cyanocobalamin (,VITAMIN B-12,) 1000 MCG/ML injection INJECT 1 ML IN THE MUSCLE EVERY 14 DAYS    . fluticasone (FLONASE) 50 MCG/ACT nasal spray Place 2 sprays into both nostrils daily.     Marland Kitchen. glyBURIDE-metformin (GLUCOVANCE) 2.5-500 MG tablet Take 2 tablets by mouth 2 (two) times daily with a meal.     . losartan (COZAAR) 50 MG tablet Take 1 tablet (50mg ) by mouth every day    . lovastatin (MEVACOR) 20 MG tablet Take 20 mg by mouth every evening.     . meloxicam (MOBIC) 7.5 MG tablet Take 15 mg by mouth daily as needed (for hip pain).     . metoprolol tartrate (LOPRESSOR) 25 MG tablet Take 12.5 mg by mouth 2 (two) times daily.     . Multiple Vitamin (MULTI-VITAMINS) TABS Take 1 tablet by mouth daily.     . Multiple Vitamins-Minerals (OCUVITE EXTRA PO) Take 1 tablet by mouth 2 (two) times daily.     . Omega-3 Fatty Acids (FISH OIL PO) Take 1 capsule by mouth daily.     .Marland Kitchen  traMADol (ULTRAM) 50 MG tablet Take 1 tablet (50 mg total) by mouth every 6 (six) hours as needed for moderate pain. 15 tablet 0  . vitamin E 400 UNIT capsule Take 400 Units by mouth daily.       Home: Home Living Family/patient expects to be discharged to:: Inpatient rehab Living Arrangements: Spouse/significant other Available Help at Discharge: Family, Available 24 hours/day Type of Home: House Home Access: Stairs to enter [employer] of Steps: 2 Home Layout: One level Bathroom Shower/Tub: Engineer, manufacturing systems: Standard Home Equipment: Environmental consultant - 4 wheels, Shower seat  Lives With: Spouse  Functional History: Prior Function Level of Independence:  Independent Comments: really enjoys cross-stitch Functional Status:  Mobility: Bed Mobility Overal bed mobility: Needs Assistance Bed Mobility: Rolling, Sidelying to Sit Rolling: Min assist, Mod assist Sidelying to sit: Max assist, +2 for physical assistance, HOB elevated General bed mobility comments: min A to roll right, Mod A to roll left. Assist to bring legs off of bed and elevate trunk to pivot to EOB Transfers Overall transfer level: Needs assistance Equipment used: 2 person hand held assist Transfers: Sit to/from Stand, Stand Pivot Transfers Sit to Stand: Min assist, Mod assist, +2 physical assistance Stand pivot transfers: Mod assist, +2 physical assistance General transfer comment: Min A +2 to stand from bed; Mod A +2 to stand from recliner. Mod+2 with assist to initiate weight shift with cues for posture and sequence to pivot to chair with pt advancing feet Ambulation/Gait Ambulation/Gait assistance: Max assist, +2 physical assistance Ambulation Distance (Feet): 30 Feet Assistive device: 2 person hand held assist Gait Pattern/deviations: Narrow base of support, Step-to pattern, Decreased stance time - right General Gait Details: pt with narrow BOS with right lean with initial mod assist for gait with assist for left translation, weight shift and balance with chair to follow. With increased distance and pt fatigue demonstrating increased right lean with max assist +2 for balance and chair pulled to pt. Pt with decreased stance on RLE and control of stepping Gait velocity interpretation: Below normal speed for age/gender    ADL: ADL Overall ADL's : Needs assistance/impaired Eating/Feeding: NPO Grooming: Wash/dry face, Moderate assistance Grooming Details (indicate cue type and reason): supported sitting and once help to initiate Upper Body Bathing: Total assistance Upper Body Bathing Details (indicate cue type and reason): supported sitting  Lower Body Bathing: Total  assistance Lower Body Bathing Details (indicate cue type and reason): +2 min/mod A sit<>stand Upper Body Dressing : Total assistance Upper Body Dressing Details (indicate cue type and reason): supported sitting  Lower Body Dressing Details (indicate cue type and reason): +2 min/mod A sit<>stand Toilet Transfer: +2 for physical assistance, Moderate assistance, Stand-pivot Toilet Transfer Details (indicate cue type and reason): bed>recliner Toileting- Clothing Manipulation and Hygiene: Total assistance Toileting - Clothing Manipulation Details (indicate cue type and reason): +2 min/mod A sit<>stand  Cognition: Cognition Overall Cognitive Status: Impaired/Different from baseline Arousal/Alertness: Suspect due to medications Orientation Level: Other (comment) Attention: Focused Focused Attention: Impaired Focused Attention Impairment: Verbal basic, Functional basic Problem Solving: Impaired Cognition Arousal/Alertness: Awake/alert Behavior During Therapy: Flat affect Overall Cognitive Status: Impaired/Different from baseline Area of Impairment: Following commands, Safety/judgement, Awareness, Problem solving, Attention Current Attention Level: Sustained Following Commands: Follows one step commands inconsistently, Follows one step commands with increased time Safety/Judgement: Decreased awareness of safety, Decreased awareness of deficits Awareness: Intellectual Problem Solving: Slow processing, Decreased initiation, Difficulty sequencing, Requires verbal cues, Requires tactile cues General Comments: Right inattention  Blood pressure Marland Kitchen)  165/81, pulse 86, temperature 99.2 F (37.3 C), temperature source Oral, resp. rate 20, height 5\' 7"  (1.702 m), weight 56.2 kg (124 lb), SpO2 100 %. Physical Exam  Constitutional: He appears well-developed.  Frail  HENT:  Head: Normocephalic and atraumatic.  +NG  Eyes: EOM are normal. Right eye exhibits no discharge. Left eye exhibits no discharge.   Neck: Normal range of motion. Neck supple.  Cardiovascular: Normal rate and regular rhythm.  Respiratory:  Limited inspiratory effort, but clear  GI: Soft. Bowel sounds are normal.  Musculoskeletal: He exhibits no edema or tenderness.  Neurological: He is alert.  Right facial weakness Motor: Limited by ability to follow commands. Spontaneously moving LUE/LLE, no movement noted in RUE/RLE  Skin: Skin is warm and dry.    Results for orders placed or performed during the hospital encounter of 11/19/17 (from the past 24 hour(s))  Glucose, capillary     Status: None   Collection Time: 11/21/17  7:43 PM  Result Value Ref Range   Glucose-Capillary 90 65 - 99 mg/dL  Glucose, capillary     Status: None   Collection Time: 11/21/17 11:41 PM  Result Value Ref Range   Glucose-Capillary 98 65 - 99 mg/dL  Glucose, capillary     Status: None   Collection Time: 11/22/17  3:44 AM  Result Value Ref Range   Glucose-Capillary 99 65 - 99 mg/dL  CBC     Status: Abnormal   Collection Time: 11/22/17  5:14 AM  Result Value Ref Range   WBC 8.0 4.0 - 10.5 K/uL   RBC 3.55 (L) 4.22 - 5.81 MIL/uL   Hemoglobin 10.9 (L) 13.0 - 17.0 g/dL   HCT 16.1 (L) 09.6 - 04.5 %   MCV 91.8 78.0 - 100.0 fL   MCH 30.7 26.0 - 34.0 pg   MCHC 33.4 30.0 - 36.0 g/dL   RDW 40.9 81.1 - 91.4 %   Platelets 212 150 - 400 K/uL  Basic metabolic panel     Status: Abnormal   Collection Time: 11/22/17  5:14 AM  Result Value Ref Range   Sodium 138 135 - 145 mmol/L   Potassium 3.9 3.5 - 5.1 mmol/L   Chloride 107 101 - 111 mmol/L   CO2 22 22 - 32 mmol/L   Glucose, Bld 97 65 - 99 mg/dL   BUN 15 6 - 20 mg/dL   Creatinine, Ser 7.82 0.61 - 1.24 mg/dL   Calcium 8.3 (L) 8.9 - 10.3 mg/dL   GFR calc non Af Amer 55 (L) >60 mL/min   GFR calc Af Amer >60 >60 mL/min   Anion gap 9 5 - 15  Valproic acid level     Status: None   Collection Time: 11/22/17  5:14 AM  Result Value Ref Range   Valproic Acid Lvl 55 50.0 - 100.0 ug/mL    Procalcitonin     Status: None   Collection Time: 11/22/17  5:14 AM  Result Value Ref Range   Procalcitonin <0.10 ng/mL  Troponin I     Status: Abnormal   Collection Time: 11/22/17  5:14 AM  Result Value Ref Range   Troponin I 0.12 (HH) <0.03 ng/mL  Glucose, capillary     Status: Abnormal   Collection Time: 11/22/17  8:31 AM  Result Value Ref Range   Glucose-Capillary 108 (H) 65 - 99 mg/dL  Glucose, capillary     Status: Abnormal   Collection Time: 11/22/17 12:00 PM  Result Value Ref Range   Glucose-Capillary 125 (  H) 65 - 99 mg/dL  Glucose, capillary     Status: Abnormal   Collection Time: 11/22/17  4:06 PM  Result Value Ref Range   Glucose-Capillary 104 (H) 65 - 99 mg/dL   Dg Chest Port 1 View  Result Date: 11/21/2017 CLINICAL DATA:  Coughing. EXAM: PORTABLE CHEST 1 VIEW COMPARISON:  11/20/2017 FINDINGS: Stable changes from prior cardiac surgery. Cardiac silhouette is mildly enlarged. No mediastinal or hilar masses. Opacities noted at the medial left lung base similar to the prior exam. This may reflect atelectasis or pneumonia. Mild chronic scarring in the left upper lobe. Remainder of the lungs is clear. No pleural effusion or pneumothorax. IMPRESSION: 1. No significant change from the previous day's study. 2. Left medial lung base opacity is again noted consistent with pneumonia or atelectasis. Electronically Signed   By: Amie Portlandavid  Ormond M.D.   On: 11/21/2017 09:30    Assessment/Plan: Diagnosis: AMS Labs and images independently reviewed.  Records reviewed and summated above.  1. Does the need for close, 24 hr/day medical supervision in concert with the patient's rehab needs make it unreasonable for this patient to be served in a less intensive setting? Yes  2. Co-Morbidities requiring supervision/potential complications: latent TB, CAD status post myocardial infarction and CABG, HTN (monitor and provide prns in accordance with increased physical exertion and pain), diabetes  mellitus type 2 (Monitor in accordance with exercise and adjust meds as necessary), PAD (cont meds), ABLA (transfuse if necessary to ensure appropriate perfusion for increased activity tolerance) 3. Due to bladder management, bowel management, safety, skin/wound care, disease management, medication administration, pain management and patient education, does the patient require 24 hr/day rehab nursing? Yes 4. Does the patient require coordinated care of a physician, rehab nurse, PT (1-2 hrs/day, 5 days/week), OT (1-2 hrs/day, 5 days/week) and SLP (1-2 hrs/day, 5 days/week) to address physical and functional deficits in the context of the above medical diagnosis(es)? Yes Addressing deficits in the following areas: balance, endurance, locomotion, strength, transferring, bowel/bladder control, bathing, dressing, feeding, grooming, toileting, cognition, speech, language, swallowing and psychosocial support 5. Can the patient actively participate in an intensive therapy program of at least 3 hrs of therapy per day at least 5 days per week? Potentially 6. The potential for patient to make measurable gains while on inpatient rehab is excellent 7. Anticipated functional outcomes upon discharge from inpatient rehab are min assist and mod assist  with PT, min assist and mod assist with OT, min assist with SLP. 8. Estimated rehab length of stay to reach the above functional goals is: 24-29 days. 9. Anticipated D/C setting: Other 10. Anticipated post D/C treatments: SNF 11. Overall Rehab/Functional Prognosis: good  RECOMMENDATIONS: This patient's condition is appropriate for continued rehabilitative care in the following setting: Potentially CIR, pending completion of medical workup. Patient has agreed to participate in recommended program. Potentially Note that insurance prior authorization may be required for reimbursement for recommended care.  Comment: Rehab Admissions Coordinator to follow up.   Maryla MorrowAnkit  Cartina Brousseau, MD, [place] DomFAAPMR 11/22/2017

## 2017-11-22 NOTE — Progress Notes (Signed)
STROKE TEAM PROGRESS NOTE   SUBJECTIVE (INTERVAL HISTORY) His wife is at the bedside. Pt still nonverbal and not following commands. No events overnight. Pending MRI cervical spine.  Intermittent cough with secretions. Still has right hemiparesis. LP concerning for viral meningitis. ID following. He points and responds when he is showed pictures of art work her completed.   OBJECTIVE Temp:  [98.2 F (36.8 C)-100.8 F (38.2 C)] 98.2 F (36.8 C) (12/22 0400) Pulse Rate:  [80-97] 83 (12/22 0805) Cardiac Rhythm: Normal sinus rhythm (12/22 0800) Resp:  [16-23] 22 (12/22 0805) BP: (123-190)/(63-109) 123/109 (12/22 0805) SpO2:  [94 %-100 %] 100 % (12/22 0805)  Recent Labs  Lab 11/21/17 1601 11/21/17 1943 11/21/17 2341 11/22/17 0344 11/22/17 0831  GLUCAP 125* 90 98 99 108*   Recent Labs  Lab 11/19/17 1620 11/20/17 0728 11/21/17 0540 11/22/17 0514  NA 136 138 137 138  K 4.6 4.3 3.9 3.9  CL 103 104 103 107  CO2 26 21* 23 22  GLUCOSE 195* 90 104* 97  BUN 19 11 13 15   CREATININE 0.99 1.12 1.23 1.17  CALCIUM 9.5 9.2 8.8* 8.3*   Recent Labs  Lab 11/19/17 1620  AST 24  ALT 17  ALKPHOS 65  BILITOT 0.7  PROT 6.7  ALBUMIN 4.2   Recent Labs  Lab 11/19/17 1620 11/20/17 0728 11/21/17 0540 11/22/17 0514  WBC 6.5 8.3 8.3 8.0  NEUTROABS 4.8  --   --   --   HGB 11.7* 11.6* 11.5* 10.9*  HCT 35.6* 34.9* 34.3* 32.6*  MCV 94.3 92.1 91.5 91.8  PLT 240 232 212 212   Recent Labs  Lab 11/19/17 1620 11/22/17 0514  TROPONINI 0.05* 0.12*   Recent Labs    11/19/17 1620  LABPROT 13.5  INR 1.04   Recent Labs    11/20/17 1815  COLORURINE YELLOW  LABSPEC 1.019  PHURINE 7.0  GLUCOSEU NEGATIVE  HGBUR NEGATIVE  BILIRUBINUR NEGATIVE  KETONESUR 20*  PROTEINUR 100*  NITRITE NEGATIVE  LEUKOCYTESUR NEGATIVE       Component Value Date/Time   CHOL 129 11/20/2017 0559   TRIG 56 11/20/2017 0559   HDL 71 11/20/2017 0559   CHOLHDL 1.8 11/20/2017 0559   VLDL 11 11/20/2017 0559    LDLCALC 47 11/20/2017 0559   Lab Results  Component Value Date   HGBA1C 7.3 (H) 11/20/2017      Component Value Date/Time   LABOPIA NONE DETECTED 11/20/2017 1055   COCAINSCRNUR NONE DETECTED 11/20/2017 1055   LABBENZ NONE DETECTED 11/20/2017 1055   AMPHETMU NONE DETECTED 11/20/2017 1055   THCU NONE DETECTED 11/20/2017 1055   LABBARB NONE DETECTED 11/20/2017 1055    No results for input(s): ETH in the last 168 hours.   IMAGING  I have personally reviewed the radiological images below and agree with the radiology interpretations.   MR Cervical Spine With and Without Contrast - pending   Ct Angio Head and neck and CTP W Or Wo Contrast 11/19/2017 IMPRESSION:  CT HEAD:  1. No acute intracranial process.  2. Old RIGHT basal ganglia lacunar infarct, otherwise negative noncontrast CT HEAD for age.   CTA NECK:  1. Atherosclerosis without hemodynamically significant stenosis.  2. Severe stenosis versus occluded LEFT vertebral artery origin with immediate reconstitution, diminutive LEFT vertebral artery.  3. **An incidental finding of potential clinical significance has been found.  Multiple LEFT upper lobe pulmonary nodules including 10 mm spiculated nodule. Recommend contrast-enhanced CT chest on a nonemergent basis to assess for extent  of involvement. **    CTA HEAD:  1. No emergent large vessel occlusion or severe stenosis anterior circulation.  2. Severe stenosis versus tandem occlusion diminutive LEFT vertebral artery.   RIGHT vertebral artery is dominant.    CT PERFUSION:  1. Small perfusion defect LEFT parietal lobe/posterior watershed territory suggesting brain at risk.  Aortic Atherosclerosis (ICD10-I70.0) and Emphysema (ICD10-J43.9).   Mr Brain Wo Contrast 11/20/2017 IMPRESSION:  1. No acute intracranial abnormality.  2. Chronic microvascular ischemia and old right corona radiata lacunar infarct.  3. Loss of the normal left vertebral artery flow void,  consistent with the severely diminutive left vertebral artery demonstrated on the earlier CTA.     Ct Head Code Stroke Wo Contrast 11/19/2017 IMPRESSION:  1. No evidence of acute intracranial abnormality.  2. ASPECTS is 10.  3. Mild chronic small vessel ischemic disease and cerebral atrophy.   Chronic right basal ganglia lacunar infarct.    EEG: 11/20/2017 This EEG is abnormal due to diffuse slowing of the waking background. Clinical Correlation of the above findings indicates diffuse cerebral dysfunction that is non-specific in etiology and can be seen with hypoxic/ischemic injury, toxic/metabolic encephalopathies, neurodegenerative disorders, or medication effect.  However, findings may also be due to excessive drowsiness.  Clinical correlation advised.   EEG: 11/21/2017 Impression: This awake and asleep EEG is abnormal due to occasional focal slowing over the left hemisphere, maximal over the left frontocentrotemporal region.  Clinical Correlation of the above findings indicates focal cerebral dysfunction over the left hemisphere region suggestive of underlying structural or physiologic abnormality. The absence of epileptiform discharges does not exclude a clinical diagnosis of epilepsy. Clinical correlation is advised.   [name] Contrast 11/20/2017 IMPRESSION:  1. No acute intracranial abnormality identified. No evidence for acute infarct or hemorrhage status post tPA administration. No imaging findings to suggest acute CNS infection.  2. Stable atrophy with chronic small vessel ischemic disease with old right corona radiata lacunar infarct.  3. Right mastoid effusion.    Dg Chest Port 1 View 11/20/2017 IMPRESSION:  Small area of infiltrate or increased atelectasis in the left lung base, new since the prior CT scan.    Dg Chest Port 1 View 11/21/2017 IMPRESSION: 1. No significant change from the previous day's study. 2. Left medial lung base opacity is again  noted consistent with pneumonia or atelectasis.    TTE  - Left ventricle: The cavity size was normal. Systolic function was   normal. The estimated ejection fraction was in the range of 50%   to 55%. Hypokinesis of the apical myocardium. Doppler parameters   are consistent with abnormal left ventricular relaxation (grade 1   diastolic dysfunction). Doppler parameters are consistent with   high ventricular filling pressure. - Aortic valve: Transvalvular velocity was within the normal range.   There was no stenosis. There was no regurgitation. Valve area   (VTI): 2.8 cm^2. Valve area (Vmax): 2.67 cm^2. Valve area   (Vmean): 2.38 cm^2. - Mitral valve: Transvalvular velocity was within the normal range.   There was no evidence for stenosis. There was mild regurgitation. - Left atrium: The atrium was moderately dilated. - Right ventricle: The cavity size was normal. Wall thickness was   normal. Systolic function was normal. - Right atrium: The atrium was mildly dilated. - Atrial septum: No defect or patent foramen ovale was identified. - Tricuspid valve: There was no regurgitation.    PHYSICAL EXAM Vitals:   11/22/17 0600 11/22/17 0700 11/22/17 0800 11/22/17  0805  BP: (!) 163/74 (!) 173/80 (!) 190/88 (!) 123/109  Pulse: 80 80 82 83  Resp: 16 18 18  (!) 22  Temp:      TempSrc:      SpO2: 100% 100% 100% 100%    General - Well nourished, well developed, still obtunded and not following commands.  Ophthalmologic - fundi not visualized due to noncooperation.  Cardiovascular - Regular rate and rhythm.  Neuro - obtunded, not following commands. Eyes closed, but able to open with voice. Nonverbal, not able to name or repeat, not following commands. Inconsistently blinking to visual threat bilaterally. Eyes mainly natural position, not movement bilaterally as command, doll's eye present. PREEL. Positive corneal and gag. LUE and LLE move spontaneously and against gravity, RUE 2/5 and RLE  3/5 on pain. DTR 1+ and not cooperative on babinski. Sensation, coordination and gait not tested.   ASSESSMENT/PLAN Mr. Peter Becker is a 83 y.o. male with history of DM, HTN, MI s/p CABG, PVD, and HLD on lovastatin admitted for AMS, confusion, aphasia, wax and waning process. TPA given at OSH.    AMS with intermittent fever - Seizure with Todd paralysis vs. CNS infection - presentation concerning for seizure like activity and CSF concerning for viral meningitis    Resultant AMS, nonverbal, agitation, left hemiparesis  MRI  No acute stroke  MRI brain repeat with and without contrast negative  CTA head and neck b/l ICA proximal and right VA origin athero, left VA hypoplastic  2D Echo EF 50-55%  EEG - diffuse slowing no seizure  TSH/B12 and ammonia WNL  LP showed elevated RBC, WBC and protein, concerning for viral meningitis. ID consulted  Blood culture - no growth day 1  Repeat EEG - see above  LDL 47  HgbA1c 7.3  SCDs for VTE prophylaxis  Diet NPO time specified   aspirin 81 mg daily prior to admission, now on No antithrombotic  Therapy recommendations:  pending  Disposition:  Pending  ? pneumonia   CXR showed new small area of infiltration left lung base  Repeat CXR - no significant change - possible pneumonia - not currently on antibiotics.  Has cough with secretion  Intermittent fever  Diabetes  HgbA1c 7.3 goal < 7.0  Uncontrolled  Home meds -glyburide  CBG monitoring  SSI  Hypertension Stable  Long term BP goal normotensive  Hyperlipidemia  Home meds:  lovastatin   LDL 47, goal < 70  Resume once po access  Continue statin at discharge  Other Stroke Risk Factors  Advanced age  CAD/MI s/p CABG  PVD   Seizure Activity  Now on Depacon - 500 mg IV every 8 hours - level pending  EEGs 2 - abnormal as noted above   Other Active Problems  Tmax - 100.9 -> 98.2 Saturday  Multiple LEFT upper lobe pulmonary nodules including 10  mm spiculated nodule. Contrast-enhanced CT chest on a nonemergent basis to assess for extent of involvement recommended.  Possible pneumonia - consider antibiotics  Tube feeds started, nutrition consulted  Hospital day # 3  This patient is critically ill due to AMS, s/p tPA, agitation and at significant risk of neurological worsening, death form stroke, hemorrhage, seizure, status epilepticus, sepsis. This patient's care requires constant monitoring of vital signs, hemodynamics, respiratory and cardiac monitoring, review of multiple databases, neurological assessment, discussion with family, other specialists and medical decision making of high complexity. I had long discussion with wife at bedside, updated pt current condition, treatment plan and potential prognosis.  She expressed understanding and appreciation. I also discussed with Dr. Daiva EvesVan Dam over the phone. I spent 30 minutes of neurocritical care time in the care of this patient.     To contact Stroke Continuity provider, please refer to [email or web address]. After hours, contact General Neurology

## 2017-11-22 NOTE — Progress Notes (Signed)
PT Cancellation Note  Patient Details Name: Peter FretRalph Becker MRN: [id number] DOB: 16-May-1929   Cancelled Treatment:    Reason Eval/Treat Not Completed: Medical issues which prohibited therapy(pt remains on strict bedrest)   Peter Becker 11/22/2017, 7:10 AM Peter MeigsMaija Becker Peter Becker, PT [phone]

## 2017-11-23 ENCOUNTER — Inpatient Hospital Stay (HOSPITAL_COMMUNITY): Payer: Medicare Other

## 2017-11-23 DIAGNOSIS — G0490 Encephalitis and encephalomyelitis, unspecified: Secondary | ICD-10-CM

## 2017-11-23 DIAGNOSIS — J690 Pneumonitis due to inhalation of food and vomit: Secondary | ICD-10-CM

## 2017-11-23 LAB — GLUCOSE, CAPILLARY
GLUCOSE-CAPILLARY: 180 mg/dL — AB (ref 65–99)
GLUCOSE-CAPILLARY: 203 mg/dL — AB (ref 65–99)
Glucose-Capillary: 170 mg/dL — ABNORMAL HIGH (ref 65–99)
Glucose-Capillary: 123 mg/dL — ABNORMAL HIGH (ref 65–99)
Glucose-Capillary: 65 mg/dL (ref 65–99)
Glucose-Capillary: 66 mg/dL (ref 65–99)
Glucose-Capillary: 162 mg/dL — ABNORMAL HIGH (ref 65–99)
Glucose-Capillary: 72 mg/dL (ref 65–99)

## 2017-11-23 LAB — CBC
HEMATOCRIT: 32 % — AB (ref 39.0–52.0)
Hemoglobin: 10.8 g/dL — ABNORMAL LOW (ref 13.0–17.0)
MCH: 31 pg (ref 26.0–34.0)
MCHC: 33.8 g/dL (ref 30.0–36.0)
MCV: 92 fL (ref 78.0–100.0)
PLATELETS: 203 10*3/uL (ref 150–400)
RBC: 3.48 MIL/uL — ABNORMAL LOW (ref 4.22–5.81)
RDW: 13 % (ref 11.5–15.5)
WBC: 8.1 10*3/uL (ref 4.0–10.5)

## 2017-11-23 LAB — BASIC METABOLIC PANEL
ANION GAP: 7 (ref 5–15)
BUN: 18 mg/dL (ref 6–20)
CHLORIDE: 109 mmol/L (ref 101–111)
CO2: 24 mmol/L (ref 22–32)
Calcium: 8.2 mg/dL — ABNORMAL LOW (ref 8.9–10.3)
Creatinine, Ser: 1.06 mg/dL (ref 0.61–1.24)
GFR calc non Af Amer: >60 mL/min (ref >60)
Glucose, Bld: 195 mg/dL — ABNORMAL HIGH (ref 65–99)
POTASSIUM: 3.4 mmol/L — AB (ref 3.5–5.1)
SODIUM: 140 mmol/L (ref 135–145)

## 2017-11-23 LAB — URINALYSIS, ROUTINE W REFLEX MICROSCOPIC
Bilirubin Urine: NEGATIVE
Glucose, UA: 50 mg/dL — AB
Hgb urine dipstick: NEGATIVE
Ketones, ur: 5 mg/dL — AB
Leukocytes, UA: NEGATIVE
Nitrite: NEGATIVE
Protein, ur: 100 mg/dL — AB
Specific Gravity, Urine: 1.025 (ref 1.005–1.030)
Squamous Epithelial / LPF: NONE SEEN
pH: 5 (ref 5.0–8.0)

## 2017-11-23 LAB — STREP PNEUMONIAE URINARY ANTIGEN: STREP PNEUMO URINARY ANTIGEN: NEGATIVE

## 2017-11-23 LAB — VALPROIC ACID LEVEL: VALPROIC ACID LVL: 71 ug/mL (ref 50.0–100.0)

## 2017-11-23 LAB — PROCALCITONIN: Procalcitonin: <0.1 ng/mL

## 2017-11-23 LAB — ENTEROVIRUS PCR: Enterovirus PCR: NEGATIVE

## 2017-11-23 MED ORDER — SODIUM CHLORIDE 0.9 % IV SOLN
500.0000 mg | Freq: Two times a day (BID) | INTRAVENOUS | Status: DC
  Administered 2017-11-23 – 2017-11-25 (×4): 500 mg via INTRAVENOUS
  Filled 2017-11-23 (×6): qty 500

## 2017-11-23 MED ORDER — DEXTROSE 50 % IV SOLN
INTRAVENOUS | Status: AC
  Administered 2017-11-23: 25 mL
  Filled 2017-11-23: qty 50

## 2017-11-23 MED ORDER — IOPAMIDOL (ISOVUE-300) INJECTION 61%
INTRAVENOUS | Status: AC
  Administered 2017-11-23: 75 mL via INTRAVENOUS
  Filled 2017-11-23: qty 75

## 2017-11-23 MED ORDER — CEFEPIME HCL 1 G IJ SOLR
1.0000 g | Freq: Three times a day (TID) | INTRAMUSCULAR | Status: DC
  Filled 2017-11-23: qty 1

## 2017-11-23 MED ORDER — CEFEPIME HCL 1 G IJ SOLR
1.0000 g | INTRAMUSCULAR | Status: DC
  Administered 2017-11-23 – 2017-11-25 (×3): 1 g via INTRAVENOUS
  Filled 2017-11-23 (×4): qty 1

## 2017-11-23 NOTE — Consult Note (Signed)
Medical Consultation   Peter Becker  ZOX:[id number]  DOB: [birth year]  DOA: 11/19/2017  PCP: Danella Penton, MD   Outpatient Specialists:    Requesting physician: Nicholaus Bloom PA-C and Dr. Delia Heady   Reason for consultation: Suspected viral meningitis and Pneumonia.    History of Present Illness: Peter Becker is an 83 y.o. male with a past medical history of diabetes, hypertension, peripheral arterial disease, and  coronary artery disease who was admitted for CVA evaluation after onset of weakness and aphasia at 2pm on 12/19.  Pt has had continued weakness and aphasia.Mri showed no evidence of acute stroke.  Pt has been seen by Infectious disease for LP due to possible concern for meningitis.  Pt had a chest xray today that shows progressive left lower lobe pneumonia Pt is currently unresponsive.  Records review pt has not shown any improvement since admission.   Pt is currently on TPN.  He has an ng tube and is on continuous monitoring in Neuro ICu.     ROS Unable to obtain as pt is semiconscious, aphasic and unable to communicate at all at this time     Review of Systems:  ROS  ROS Unable to obtain as pt is semiconscious, aphasic and unable to communicate at all at this time    Past Medical History: Past Medical History:  Diagnosis Date  . Diabetes mellitus without complication (HCC)   . Hypertension   . Myocardial infarct [facility])     Past Surgical History: Past Surgical History:  Procedure Laterality Date  . CARPAL TUNNEL RELEASE Left   . CHOLECYSTECTOMY    . EYE SURGERY    . heart stent     triple bypass  . HEMORRHOID SURGERY    . TONSILLECTOMY       Allergies:   Allergies  Allergen Reactions  . Tape Other (See Comments)    SKIN IS VERY THIN AND TEARS AND BRUISES EASILY; Please use an alternative!!     Social History:  reports that he has quit smoking. he has never used smokeless tobacco. He reports that he does not drink alcohol or  use drugs.   Family History: Family History  Problem Relation Age of Onset  . Heart attack Father        Physical Exam: Vitals:   11/23/17 0700 11/23/17 0800 11/23/17 0900 11/23/17 1000  BP: (!) 130/59 122/60 126/63 127/62  Pulse: 77 73 81 73  Resp: 17 18 15 18   Temp:  98.4 F (36.9 C)    TempSrc:  Axillary    SpO2: 97% 97% 96% 97%  Weight:      Height:        Constitutional: Acutely ill appearing, unresponsive Eyes: PERLA, ENMT: external ears and nose appear normal, unable to assess hearing            Lips appears normal, oropharynx mucosa, tongue, posterior pharynx appear normal  Neck: neck appears normal, no masses, normal ROM, no thyromegaly, no JVD  CVS: S1-S2 clear,  no LE edema, normal pedal pulses  Respiratory:  clear to auscultation bilaterally, no wheezing, Rhonchi presentRespiratory effort normal. No accessory muscle use.  Abdomen: soft nontender, nondistended, normal bowel sounds, no hepatosplenomegaly, no hernias  Musculoskeletal: : no cyanosis, clubbing or edema noted bilaterally. No contractures  Neuro: unresponsive  Psych: unresponsive Skin: no rashes or lesions or ulcers, no induration or nodules    Data reviewed:  I have personally reviewed following labs and imaging studies Labs:  CBC: Recent Labs  Lab 11/19/17 1620 11/20/17 0728 11/21/17 0540 11/22/17 0514 11/23/17 0354  WBC 6.5 8.3 8.3 8.0 8.1  NEUTROABS 4.8  --   --   --   --   HGB 11.7* 11.6* 11.5* 10.9* 10.8*  HCT 35.6* 34.9* 34.3* 32.6* 32.0*  MCV 94.3 92.1 91.5 91.8 92.0  PLT 240 232 212 212 203    Basic Metabolic Panel: Recent Labs  Lab 11/19/17 1620 11/20/17 0728 11/21/17 0540 11/22/17 0514 11/23/17 0354  NA 136 138 137 138 140  K 4.6 4.3 3.9 3.9 3.4*  CL 103 104 103 107 109  CO2 26 21* 23 22 24   GLUCOSE 195* 90 104* 97 195*  BUN 19 11 13 15 18   CREATININE 0.99 1.12 1.23 1.17 1.06  CALCIUM 9.5 9.2 8.8* 8.3* 8.2*   GFR Estimated Creatinine  Clearance: 41.5 mL/min (by C-G formula based on SCr of 1.06 mg/dL). Liver Function Tests: Recent Labs  Lab 11/19/17 1620  AST 24  ALT 17  ALKPHOS 65  BILITOT 0.7  PROT 6.7  ALBUMIN 4.2   No results for input(s): LIPASE, AMYLASE in the last 168 hours. Recent Labs  Lab 11/20/17 1113  AMMONIA 13   Coagulation profile Recent Labs  Lab 11/19/17 1620  INR 1.04    Cardiac Enzymes: Recent Labs  Lab 11/19/17 1620 11/22/17 0514  TROPONINI 0.05* 0.12*   BNP: Invalid input(s): POCBNP CBG: Recent Labs  Lab 11/23/17 0317 11/23/17 0742 11/23/17 1203 11/23/17 1205 11/23/17 1228  GLUCAP 180* 170* 65 66 123*   D-Dimer No results for input(s): DDIMER in the last 72 hours. Hgb A1c No results for input(s): HGBA1C in the last 72 hours. Lipid Profile No results for input(s): CHOL, HDL, LDLCALC, TRIG, CHOLHDL, LDLDIRECT in the last 72 hours. Thyroid function studies No results for input(s): TSH, T4TOTAL, T3FREE, THYROIDAB in the last 72 hours.  Invalid input(s): FREET3 Anemia work up No results for input(s): VITAMINB12, FOLATE, FERRITIN, TIBC, IRON, RETICCTPCT in the last 72 hours. Urinalysis    Component Value Date/Time   COLORURINE YELLOW 11/20/2017 1815   APPEARANCEUR HAZY (A) 11/20/2017 1815   LABSPEC 1.019 11/20/2017 1815   PHURINE 7.0 11/20/2017 1815   GLUCOSEU NEGATIVE 11/20/2017 1815   HGBUR NEGATIVE 11/20/2017 1815   BILIRUBINUR NEGATIVE 11/20/2017 1815   KETONESUR 20 (A) 11/20/2017 1815   PROTEINUR 100 (A) 11/20/2017 1815   NITRITE NEGATIVE 11/20/2017 1815   LEUKOCYTESUR NEGATIVE 11/20/2017 1815     Microbiology Recent Results (from the past 240 hour(s))  MRSA PCR Screening     Status: None   Collection Time: 11/19/17  9:24 PM  Result Value Ref Range Status   MRSA by PCR NEGATIVE NEGATIVE Final    Comment:        The GeneXpert MRSA Assay (FDA approved for NASAL specimens only), is one component of a comprehensive MRSA colonization surveillance  program. It is not intended to diagnose MRSA infection nor to guide or monitor treatment for MRSA infections.   Culture, blood (routine x 2)     Status: None (Preliminary result)   Collection Time: 11/20/17 11:05 AM  Result Value Ref Range Status   Specimen Description BLOOD LEFT ANTECUBITAL  Final   Special Requests IN PEDIATRIC BOTTLE Blood Culture adequate volume  Final  Culture NO GROWTH 2 DAYS  Final   Report Status PENDING  Incomplete  Culture, blood (routine x 2)     Status: None (Preliminary result)   Collection Time: 11/20/17 11:10 AM  Result Value Ref Range Status   Specimen Description BLOOD LEFT ANTECUBITAL  Final   Special Requests IN PEDIATRIC BOTTLE Blood Culture adequate volume  Final   Culture NO GROWTH 2 DAYS  Final   Report Status PENDING  Incomplete  CSF culture     Status: None (Preliminary result)   Collection Time: 11/20/17  6:46 PM  Result Value Ref Range Status   Specimen Description CSF  Final   Special Requests NONE  Final   Gram Stain   Final    WBC PRESENT, PREDOMINANTLY PMN NO ORGANISMS SEEN CYTOSPIN SMEAR    Culture NO GROWTH 3 DAYS  Final   Report Status PENDING  Incomplete  Anaerobic culture     Status: None (Preliminary result)   Collection Time: 11/20/17  6:46 PM  Result Value Ref Range Status   Specimen Description CSF  Final   Special Requests NONE  Final   Culture   Final    NO ANAEROBES ISOLATED; CULTURE IN PROGRESS FOR 5 DAYS   Report Status PENDING  Incomplete       Inpatient Medications:   Scheduled Meds: . chlorhexidine  15 mL Mouth Rinse BID  . insulin aspart  0-9 Units Subcutaneous Q4H  . mouth rinse  15 mL Mouth Rinse q12n4p  . pantoprazole (PROTONIX) IV  40 mg Intravenous QHS   Continuous Infusions: . sodium chloride 75 mL/hr at 11/22/17 1413  . ceFEPime (MAXIPIME) IV    . feeding supplement (JEVITY 1.2 CAL) 60 mL/hr at 11/22/17 1446  . valproic acid (DEPACON) IVPB Stopped (11/23/17 0701)     Radiological  Exams on Admission: Dg Chest 2 View  Result Date: 11/23/2017 CLINICAL DATA:  History of cerebral infarction.  Pneumonia. EXAM: CHEST  2 VIEW COMPARISON:  11/21/2017 FINDINGS: Stable mild cardiac enlargement. Feeding tube present extending below the diaphragm. Lungs show progressive airspace disease and consolidation of the left lower lobe consistent with pneumonia. There may be some associated left pleural fluid. No edema or pneumothorax. IMPRESSION: Progressive left lower lobe pneumonia. There may be some associated left pleural fluid. Electronically Signed   By: Irish LackGlenn  Yamagata M.D.   On: 11/23/2017 11:13   Mr Cervical Spine W Wo Contrast  Result Date: 11/22/2017 CLINICAL DATA:  LP concerning for viral meningitis. Patient nonverbal, and not following commands. RIGHT-sided weakness. EXAM: MRI CERVICAL SPINE WITHOUT AND WITH CONTRAST TECHNIQUE: Multiplanar and multiecho pulse sequences of the cervical spine, to include the craniocervical junction and cervicothoracic junction, were obtained without and with intravenous contrast. CONTRAST:  10mL MULTIHANCE GADOBENATE DIMEGLUMINE 529 MG/ML IV SOLN COMPARISON:  MRI brain 11/20/2017. FINDINGS: Alignment: Physiologic. Vertebrae: No fracture, evidence of discitis, or bone lesion. Cord: Normal signal and morphology. Posterior Fossa, vertebral arteries, paraspinal tissues: Negative. Disc levels: C2-3:  Normal. C3-4:  Normal.  Facet arthropathy. C4-5:  Normal.  Facet arthropathy. C5-6: Shallow central protrusion. Mild stenosis. Minimal cord flattening. No definite foraminal narrowing. C6-7: Disc space narrowing. Mild stenosis. Minimal cord flattening. RIGHT-sided foraminal narrowing due to uncinate spurring. C7-T1:  Unremarkable. Post infusion imaging does not demonstrate enhancement of the vertebral bodies, or intraspinal contents. No paravertebral focus of infection is evident. IMPRESSION: No concerning features of meningeal enhancement or cord hyperintensity.  Ordinary spondylosis, without features of discitis. See discussion above. Electronically Signed  By: Elsie StainJohn T Curnes M.D.   On: 11/22/2017 19:06    Impression/Recommendations Active Problems:    Ischemic stroke [facility](HCC) --Stroke Neurology following    Altered mental status -- Possibly due loss of Arterial flow    Pneumonia --Pt started on broad spectrum antibiotics, --  Pharmacy to dose  --Ct scan ordered.    Meningoencephalitis -- acyclovir    Seizure (HCC) --Continue current medications.    FUO (fever of unknown origin)   TB lung, latent   Benign essential HTN    Diabetes mellitus type 2 in nonobese (HCC) --Continue insulin      Palative care consult      Thank you for this consultation.  [facility] will follow the patient with you.   Time Spent: 45 minutes  Langston MaskerKaren Latiya Navia PA-C [facility] 11/23/2017, 1:14 PM

## 2017-11-23 NOTE — Progress Notes (Signed)
STROKE TEAM PROGRESS NOTE   SUBJECTIVE (INTERVAL HISTORY) His family is not at the bedside. Pt still nonverbal butt following very few commands. No events overnight.   MRI cervical spine. Shows no compression or significant abnormality  Intermittent cough with secretions. Still has right hemiparesis. LP concerning for viral meningitis. ID following.    OBJECTIVE Temp:  [98 F (36.7 C)-99.2 F (37.3 C)] 98.4 F (36.9 C) (12/23 0800) Pulse Rate:  [73-98] 73 (12/23 1000) Cardiac Rhythm: Normal sinus rhythm (12/23 0800) Resp:  [15-23] 18 (12/23 1000) BP: (122-173)/(59-115) 127/62 (12/23 1000) SpO2:  [91 %-100 %] 97 % (12/23 1000) Weight:  [129 lb 3 oz (58.6 kg)] 129 lb 3 oz (58.6 kg) (12/23 0500)  Recent Labs  Lab 11/22/17 2021 11/22/17 2355 11/23/17 0317 11/23/17 0742 11/23/17 1203  GLUCAP 157* 166* 180* 170* 65   Recent Labs  Lab 11/19/17 1620 11/20/17 0728 11/21/17 0540 11/22/17 0514 11/23/17 0354  NA 136 138 137 138 140  K 4.6 4.3 3.9 3.9 3.4*  CL 103 104 103 107 109  CO2 26 21* 23 22 24   GLUCOSE 195* 90 104* 97 195*  BUN 19 11 13 15 18   CREATININE 0.99 1.12 1.23 1.17 1.06  CALCIUM 9.5 9.2 8.8* 8.3* 8.2*   Recent Labs  Lab 11/19/17 1620  AST 24  ALT 17  ALKPHOS 65  BILITOT 0.7  PROT 6.7  ALBUMIN 4.2   Recent Labs  Lab 11/19/17 1620 11/20/17 0728 11/21/17 0540 11/22/17 0514 11/23/17 0354  WBC 6.5 8.3 8.3 8.0 8.1  NEUTROABS 4.8  --   --   --   --   HGB 11.7* 11.6* 11.5* 10.9* 10.8*  HCT 35.6* 34.9* 34.3* 32.6* 32.0*  MCV 94.3 92.1 91.5 91.8 92.0  PLT 240 232 212 212 203   Recent Labs  Lab 11/19/17 1620 11/22/17 0514  TROPONINI 0.05* 0.12*   No results for input(s): LABPROT, INR in the last 72 hours. Recent Labs    11/20/17 1815  COLORURINE YELLOW  LABSPEC 1.019  PHURINE 7.0  GLUCOSEU NEGATIVE  HGBUR NEGATIVE  BILIRUBINUR NEGATIVE  KETONESUR 20*  PROTEINUR 100*  NITRITE NEGATIVE  LEUKOCYTESUR NEGATIVE       Component Value  Date/Time   CHOL 129 11/20/2017 0559   TRIG 56 11/20/2017 0559   HDL 71 11/20/2017 0559   CHOLHDL 1.8 11/20/2017 0559   VLDL 11 11/20/2017 0559   LDLCALC 47 11/20/2017 0559   Lab Results  Component Value Date   HGBA1C 7.3 (H) 11/20/2017      Component Value Date/Time   LABOPIA NONE DETECTED 11/20/2017 1055   COCAINSCRNUR NONE DETECTED 11/20/2017 1055   LABBENZ NONE DETECTED 11/20/2017 1055   AMPHETMU NONE DETECTED 11/20/2017 1055   THCU NONE DETECTED 11/20/2017 1055   LABBARB NONE DETECTED 11/20/2017 1055    No results for input(s): ETH in the last 168 hours.   IMAGING  I have personally reviewed the radiological images below and agree with the radiology interpretations.   Peter Cervical Spine With and Without Contrast - pending   Ct Angio Head and neck and CTP W Or Becker Contrast 11/19/2017 IMPRESSION:  CT HEAD:  1. No acute intracranial process.  2. Old RIGHT basal ganglia lacunar infarct, otherwise negative noncontrast CT HEAD for age.   CTA NECK:  1. Atherosclerosis without hemodynamically significant stenosis.  2. Severe stenosis versus occluded LEFT vertebral artery origin with immediate reconstitution, diminutive LEFT vertebral artery.  3. **An incidental finding of potential clinical  significance has been found.  Multiple LEFT upper lobe pulmonary nodules including 10 mm spiculated nodule. Recommend contrast-enhanced CT chest on a nonemergent basis to assess for extent of involvement. **    CTA HEAD:  1. No emergent large vessel occlusion or severe stenosis anterior circulation.  2. Severe stenosis versus tandem occlusion diminutive LEFT vertebral artery.   RIGHT vertebral artery is dominant.    CT PERFUSION:  1. Small perfusion defect LEFT parietal lobe/posterior watershed territory suggesting brain at risk.  Aortic Atherosclerosis (ICD10-I70.0) and Emphysema (ICD10-J43.9).   Peter Brain Becker Contrast 11/20/2017 IMPRESSION:  1. No acute intracranial  abnormality.  2. Chronic microvascular ischemia and old right corona radiata lacunar infarct.  3. Loss of the normal left vertebral artery flow void, consistent with the severely diminutive left vertebral artery demonstrated on the earlier CTA.     Ct Head Code Stroke Becker Contrast 11/19/2017 IMPRESSION:  1. No evidence of acute intracranial abnormality.  2. ASPECTS is 10.  3. Mild chronic small vessel ischemic disease and cerebral atrophy.   Chronic right basal ganglia lacunar infarct.    EEG: 11/20/2017 This EEG is abnormal due to diffuse slowing of the waking background. Clinical Correlation of the above findings indicates diffuse cerebral dysfunction that is non-specific in etiology and can be seen with hypoxic/ischemic injury, toxic/metabolic encephalopathies, neurodegenerative disorders, or medication effect.  However, findings may also be due to excessive drowsiness.  Clinical correlation advised.   EEG: 11/21/2017 Impression: This awake and asleep EEG is abnormal due to occasional focal slowing over the left hemisphere, maximal over the left frontocentrotemporal region.  Clinical Correlation of the above findings indicates focal cerebral dysfunction over the left hemisphere region suggestive of underlying structural or physiologic abnormality. The absence of epileptiform discharges does not exclude a clinical diagnosis of epilepsy. Clinical correlation is advised.   Peter Becker Contrast 11/20/2017 IMPRESSION:  1. No acute intracranial abnormality identified. No evidence for acute infarct or hemorrhage status post tPA administration. No imaging findings to suggest acute CNS infection.  2. Stable atrophy with chronic small vessel ischemic disease with old right corona radiata lacunar infarct.  3. Right mastoid effusion.    Dg Chest Port 1 View 11/20/2017 IMPRESSION:  Small area of infiltrate or increased atelectasis in the left lung base, new since the prior CT scan.     Dg Chest Port 1 View 11/21/2017 IMPRESSION: 1. No significant change from the previous day's study. 2. Left medial lung base opacity is again noted consistent with pneumonia or atelectasis.    TTE  - Left ventricle: The cavity size was normal. Systolic function was   normal. The estimated ejection fraction was in the range of 50%   to 55%. Hypokinesis of the apical myocardium. Doppler parameters   are consistent with abnormal left ventricular relaxation (grade 1   diastolic dysfunction). Doppler parameters are consistent with   high ventricular filling pressure. - Aortic valve: Transvalvular velocity was within the normal range.   There was no stenosis. There was no regurgitation. Valve area   (VTI): 2.8 cm^2. Valve area (Vmax): 2.67 cm^2. Valve area   (Vmean): 2.38 cm^2. - Mitral valve: Transvalvular velocity was within the normal range.   There was no evidence for stenosis. There was mild regurgitation. - Left atrium: The atrium was moderately dilated. - Right ventricle: The cavity size was normal. Wall thickness was   normal. Systolic function was normal. - Right atrium: The atrium was mildly dilated. - Atrial septum: No defect  or patent foramen ovale was identified. - Tricuspid valve: There was no regurgitation.  MRI Cervical spine : No concerning features of meningeal enhancement or cord hyperintensity.Ordinary spondylosis, without features of discitis  PHYSICAL EXAM Vitals:   11/23/17 0700 11/23/17 0800 11/23/17 0900 11/23/17 1000  BP: (!) 130/59 122/60 126/63 127/62  Pulse: 77 73 81 73  Resp: 17 18 15 18   Temp:  98.4 F (36.9 C)    TempSrc:  Axillary    SpO2: 97% 97% 96% 97%  Weight:      Height:        General - Well nourished, well developed, still obtunded and not following commands.  Ophthalmologic - fundi not visualized due to noncooperation.  Cardiovascular - Regular rate and rhythm.  Neuro - obtunded, not following commands. Eyes closed, but able  to open with voice. Nonverbal, not able to name or repeat,   following only few occasional commands. Inconsistently blinking to visual threat bilaterally. Eyes mainly natural position, not movement bilaterally as command, doll's eye present. PREEL. Positive corneal and gag. LUE and LLE move spontaneously and against gravity, RUE 2/5 and RLE 3/5 on pain. DTR 1+ and not cooperative on babinski. Sensation, coordination and gait not tested.   ASSESSMENT/PLAN Peter. Peter Becker is a 83 y.o. male with history of DM, HTN, MI s/p CABG, PVD, and HLD on lovastatin admitted for AMS, confusion, aphasia, wax and waning process. TPA given at OSH.    AMS with intermittent fever - Seizure with Todd paralysis vs. CNS infection -meningoencephalitis presentation concerning for seizure like activity and CSF concerning for viral meningitis    Resultant AMS, nonverbal, agitation, left hemiparesis  MRI  No acute stroke  MRI brain repeat with and without contrast negative  CTA head and neck b/l ICA proximal and right VA origin athero, left VA hypoplastic  2D Echo EF 50-55%  EEG - diffuse slowing no seizure  TSH/B12 and ammonia WNL  LP showed elevated RBC, WBC and protein, concerning for viral meningitis. ID consulted  Blood culture - no growth day 1  Repeat EEG - see above  LDL 47  HgbA1c 7.3  SCDs for VTE prophylaxis  Diet NPO time specified   aspirin 81 mg daily prior to admission, now on No antithrombotic  Therapy recommendations:  pending  Disposition:  Pending  ? pneumonia   CXR showed new small area of infiltration left lung base  Repeat CXR - no significant change - possible pneumonia - not currently on antibiotics.  Has cough with secretion  Intermittent fever  Diabetes  HgbA1c 7.3 goal < 7.0  Uncontrolled  Home meds -glyburide  CBG monitoring  SSI  Hypertension Stable  Long term BP goal normotensive  Hyperlipidemia  Home meds:  lovastatin   LDL 47, goal <  70  Resume once po access  Continue statin at discharge  Other Stroke Risk Factors  Advanced age  CAD/MI s/p CABG  PVD   Seizure Activity  Now on Depacon - 500 mg IV every 8 hours - level optimal at 71  EEGs 2 - abnormal as noted above   Other Active Problems  Tmax - 100.9 -> 98.2 Saturday  Multiple LEFT upper lobe pulmonary nodules including 10 mm spiculated nodule. Contrast-enhanced CT chest on a nonemergent basis to assess for extent of involvement recommended.  Possible pneumonia - consider antibiotics will call CCM/medical hospitalist team to consult and help manage  Tube feeds started, nutrition consulted  Hospital day # 4  This patient is  critically ill due to AMS, s/p tPA, agitation and at significant risk of neurological worsening, death form stroke, hemorrhage, seizure, status epilepticus, sepsis. This patient's care requires constant monitoring of vital signs, hemodynamics, respiratory and cardiac monitoring, review of multiple databases, neurological assessment, discussion with family, other specialists and medical decision making of high complexity. I had long discussion with wife at bedside, updated pt current condition, treatment plan and potential prognosis. She expressed understanding and appreciation. I also discussed with Dr. Ninetta LightsHatcher. I spent 30 minutes of neurocritical care time in the care of this patient.  Delia HeadyPramod Barbarita Hutmacher, MD Medical Director [facility] Pager: [phone] 11/23/2017 12:10 PM   To contact Stroke Continuity provider, please refer to [email or web address]. After hours, contact General Neurology

## 2017-11-23 NOTE — Progress Notes (Signed)
Pharmacy Antibiotic Note  Peter FretRalph Becker is a 83 y.o. male admitted on 11/19/2017 as a Code Stroke.  Pharmacy has been consulted for vancomycin dosing for pneumonia. Pt is afebrile and WBC is WNL. SCr WNL.   Plan: Vancomycin 500mg  IV Q12H Change cefepime to 1gm IV Q24H F/u renal fxn, C&S, clinical status and trough at SS  Height: 5\' 7"  (170.2 cm) Weight: 129 lb 3 oz (58.6 kg) IBW/kg (Calculated) : 66.1  Temp (24hrs), Avg:98.6 F (37 C), Min:98 F (36.7 C), Max:99.2 F (37.3 C)  Recent Labs  Lab 11/19/17 1620 11/20/17 0728 11/21/17 0540 11/22/17 0514 11/23/17 0354  WBC 6.5 8.3 8.3 8.0 8.1  CREATININE 0.99 1.12 1.23 1.17 1.06    Estimated Creatinine Clearance: 41.5 mL/min (by C-G formula based on SCr of 1.06 mg/dL).    Allergies  Allergen Reactions  . Tape Other (See Comments)    SKIN IS VERY THIN AND TEARS AND BRUISES EASILY; Please use an alternative!!    Antimicrobials this admission: Vanc 12/23>> Cefepime 12/23>> Acyclovir 12/20>>12/22  Dose adjustments this admission: N/A  Microbiology results: 12/20BCx: NGTD 12/20 CSF: NGTD 12/20MRSA PCR: neg  Thank you for allowing pharmacy to be a part of this patient's care.  Peter Becker, Peter Becker 11/23/2017 1:15 PM

## 2017-11-24 DIAGNOSIS — Z515 Encounter for palliative care: Secondary | ICD-10-CM

## 2017-11-24 DIAGNOSIS — R05 Cough: Secondary | ICD-10-CM

## 2017-11-24 DIAGNOSIS — R1310 Dysphagia, unspecified: Secondary | ICD-10-CM

## 2017-11-24 DIAGNOSIS — J988 Other specified respiratory disorders: Secondary | ICD-10-CM

## 2017-11-24 DIAGNOSIS — Z66 Do not resuscitate: Secondary | ICD-10-CM

## 2017-11-24 LAB — CSF CULTURE

## 2017-11-24 LAB — BASIC METABOLIC PANEL
Anion gap: 6 (ref 5–15)
BUN: 17 mg/dL (ref 6–20)
CALCIUM: 8.2 mg/dL — AB (ref 8.9–10.3)
CO2: 24 mmol/L (ref 22–32)
CREATININE: 0.9 mg/dL (ref 0.61–1.24)
Chloride: 114 mmol/L — ABNORMAL HIGH (ref 101–111)
Glucose, Bld: 158 mg/dL — ABNORMAL HIGH (ref 65–99)
Potassium: 3.3 mmol/L — ABNORMAL LOW (ref 3.5–5.1)
SODIUM: 144 mmol/L (ref 135–145)

## 2017-11-24 LAB — CBC WITH DIFFERENTIAL/PLATELET
BASOS ABS: 0 10*3/uL (ref 0.0–0.1)
Basophils Relative: 0 %
EOS ABS: 0.6 10*3/uL (ref 0.0–0.7)
EOS PCT: 8 %
HCT: 29.8 % — ABNORMAL LOW (ref 39.0–52.0)
Hemoglobin: 10.1 g/dL — ABNORMAL LOW (ref 13.0–17.0)
Lymphocytes Relative: 11 %
Lymphs Abs: 0.8 10*3/uL (ref 0.7–4.0)
MCH: 31.2 pg (ref 26.0–34.0)
MCHC: 33.9 g/dL (ref 30.0–36.0)
MCV: 92 fL (ref 78.0–100.0)
MONO ABS: 0.6 10*3/uL (ref 0.1–1.0)
Monocytes Relative: 8 %
Neutro Abs: 5.6 10*3/uL (ref 1.7–7.7)
Neutrophils Relative %: 73 %
PLATELETS: 180 10*3/uL (ref 150–400)
RBC: 3.24 MIL/uL — AB (ref 4.22–5.81)
RDW: 13 % (ref 11.5–15.5)
WBC: 7.5 10*3/uL (ref 4.0–10.5)

## 2017-11-24 LAB — GLUCOSE, CAPILLARY
GLUCOSE-CAPILLARY: 154 mg/dL — AB (ref 65–99)
Glucose-Capillary: 230 mg/dL — ABNORMAL HIGH (ref 65–99)
Glucose-Capillary: 182 mg/dL — ABNORMAL HIGH (ref 65–99)
Glucose-Capillary: 189 mg/dL — ABNORMAL HIGH (ref 65–99)

## 2017-11-24 LAB — HIV ANTIBODY (ROUTINE TESTING W REFLEX): HIV SCREEN 4TH GENERATION: NONREACTIVE

## 2017-11-24 LAB — VALPROIC ACID LEVEL: VALPROIC ACID LVL: 65 ug/mL (ref 50.0–100.0)

## 2017-11-24 LAB — CSF CULTURE W GRAM STAIN: Culture: NO GROWTH

## 2017-11-24 MED ORDER — METOPROLOL TARTRATE 12.5 MG HALF TABLET
12.5000 mg | ORAL_TABLET | Freq: Two times a day (BID) | ORAL | Status: DC
  Administered 2017-11-24: 12.5 mg via ORAL
  Filled 2017-11-24 (×2): qty 1

## 2017-11-24 MED ORDER — PRAVASTATIN SODIUM 20 MG PO TABS: 10.0000 mg | ORAL_TABLET | Freq: Every day | ORAL | Status: DC

## 2017-11-24 MED ORDER — ASPIRIN 81 MG PO CHEW
81.0000 mg | CHEWABLE_TABLET | Freq: Every day | ORAL | Status: DC
  Filled 2017-11-24: qty 1

## 2017-11-24 MED ORDER — ADULT MULTIVITAMIN W/MINERALS CH: ORAL_TABLET | Freq: Every day | ORAL | Status: DC

## 2017-11-24 MED ORDER — CALCIUM CARBONATE-VITAMIN D 500-200 MG-UNIT PO TABS
1.0000 | ORAL_TABLET | Freq: Every day | ORAL | Status: DC
  Filled 2017-11-24: qty 1

## 2017-11-24 MED ORDER — CALCIUM 600+D3 PLUS MINERALS 600-800 MG-UNIT PO TABS: ORAL_TABLET | Freq: Every day | ORAL | Status: DC

## 2017-11-24 MED ORDER — LOSARTAN POTASSIUM 25 MG PO TABS: 25.0000 mg | ORAL_TABLET | Freq: Every day | ORAL | Status: DC

## 2017-11-24 MED ORDER — GLYBURIDE-METFORMIN 2.5-500 MG PO TABS: 2.0000 | ORAL_TABLET | Freq: Two times a day (BID) | ORAL | Status: DC

## 2017-11-24 MED ORDER — GLYBURIDE 5 MG PO TABS
5.0000 mg | ORAL_TABLET | Freq: Two times a day (BID) | ORAL | Status: DC
  Filled 2017-11-24: qty 1

## 2017-11-24 MED ORDER — VITAMIN D3 25 MCG (1000 UNIT) PO TABS
2000.0000 [IU] | ORAL_TABLET | Freq: Every day | ORAL | Status: DC
  Filled 2017-11-24: qty 2

## 2017-11-24 MED ORDER — OCUVITE EXTRA PO TABS: ORAL_TABLET | Freq: Two times a day (BID) | ORAL | Status: DC

## 2017-11-24 MED ORDER — METFORMIN HCL 500 MG PO TABS: 1000.0000 mg | ORAL_TABLET | Freq: Two times a day (BID) | ORAL | Status: DC

## 2017-11-24 MED ORDER — TRAMADOL HCL 50 MG PO TABS: 50.0000 mg | ORAL_TABLET | Freq: Four times a day (QID) | ORAL | Status: DC | PRN

## 2017-11-24 MED ORDER — MELOXICAM 7.5 MG PO TABS
15.0000 mg | ORAL_TABLET | Freq: Every day | ORAL | Status: DC | PRN
Start: 2017-11-24 — End: 2017-11-25

## 2017-11-24 MED ORDER — OMEGA-3-ACID ETHYL ESTERS 1 G PO CAPS: 1.0000 g | ORAL_CAPSULE | Freq: Every day | ORAL | Status: DC

## 2017-11-24 MED ORDER — FLUTICASONE PROPIONATE 50 MCG/ACT NA SUSP
2.0000 | Freq: Every day | NASAL | Status: DC
  Filled 2017-11-24 (×2): qty 16

## 2017-11-24 NOTE — Progress Notes (Signed)
I met with pt's wife and daughter at bedside to begin discussions concerning a possible inpt rehab admit pending his functional progress with therapy and medical workup completion. I will follow. [phone] 

## 2017-11-24 NOTE — Progress Notes (Signed)
STROKE TEAM PROGRESS NOTE   SUBJECTIVE (INTERVAL HISTORY) His wife is  t at the bedside. Pt still nonverbal but following  few commands. No events overnight.   He has been started on antibiotics and appreciate [facility] hospitalist team help.   OBJECTIVE Temp:  [97.7 F (36.5 C)-98.4 F (36.9 C)] 98.1 F (36.7 C) (12/24 1200) Pulse Rate:  [69-87] 86 (12/24 0700) Cardiac Rhythm: Normal sinus rhythm (12/24 0400) Resp:  [16-21] 21 (12/24 0700) BP: (123-164)/(61-82) 164/82 (12/24 0700) SpO2:  [91 %-100 %] 95 % (12/24 0700) Weight:  [133 lb 13.1 oz (60.7 kg)] 133 lb 13.1 oz (60.7 kg) (12/24 0500)  Recent Labs  Lab 11/23/17 1603 11/23/17 2035 11/23/17 2309 11/24/17 0305 11/24/17 0748  GLUCAP 203* 162* 72 154* 230*   Recent Labs  Lab 11/20/17 0728 11/21/17 0540 11/22/17 0514 11/23/17 0354 11/24/17 0307  NA 138 137 138 140 144  K 4.3 3.9 3.9 3.4* 3.3*  CL 104 103 107 109 114*  CO2 21* 23 22 24 24   GLUCOSE 90 104* 97 195* 158*  BUN 11 13 15 18 17   CREATININE 1.12 1.23 1.17 1.06 0.90  CALCIUM 9.2 8.8* 8.3* 8.2* 8.2*   Recent Labs  Lab 11/19/17 1620  AST 24  ALT 17  ALKPHOS 65  BILITOT 0.7  PROT 6.7  ALBUMIN 4.2   Recent Labs  Lab 11/19/17 1620 11/20/17 0728 11/21/17 0540 11/22/17 0514 11/23/17 0354 11/24/17 0307  WBC 6.5 8.3 8.3 8.0 8.1 7.5  NEUTROABS 4.8  --   --   --   --  5.6  HGB 11.7* 11.6* 11.5* 10.9* 10.8* 10.1*  HCT 35.6* 34.9* 34.3* 32.6* 32.0* 29.8*  MCV 94.3 92.1 91.5 91.8 92.0 92.0  PLT 240 232 212 212 203 180   Recent Labs  Lab 11/19/17 1620 11/22/17 0514  TROPONINI 0.05* 0.12*   No results for input(s): LABPROT, INR in the last 72 hours. Recent Labs    11/23/17 1435  COLORURINE YELLOW  LABSPEC 1.025  PHURINE 5.0  GLUCOSEU 50*  HGBUR NEGATIVE  BILIRUBINUR NEGATIVE  KETONESUR 5*  PROTEINUR 100*  NITRITE NEGATIVE  LEUKOCYTESUR NEGATIVE       Component Value Date/Time   CHOL 129 11/20/2017 0559   TRIG 56 11/20/2017 0559    HDL 71 11/20/2017 0559   CHOLHDL 1.8 11/20/2017 0559   VLDL 11 11/20/2017 0559   LDLCALC 47 11/20/2017 0559   Lab Results  Component Value Date   HGBA1C 7.3 (H) 11/20/2017      Component Value Date/Time   LABOPIA NONE DETECTED 11/20/2017 1055   COCAINSCRNUR NONE DETECTED 11/20/2017 1055   LABBENZ NONE DETECTED 11/20/2017 1055   AMPHETMU NONE DETECTED 11/20/2017 1055   THCU NONE DETECTED 11/20/2017 1055   LABBARB NONE DETECTED 11/20/2017 1055    No results for input(s): ETH in the last 168 hours.   IMAGING  I have personally reviewed the radiological images below and agree with the radiology interpretations.   MR Cervical Spine With and Without Contrast - pending   Ct Angio Head and neck and CTP W Or Wo Contrast 11/19/2017 IMPRESSION:  CT HEAD:  1. No acute intracranial process.  2. Old RIGHT basal ganglia lacunar infarct, otherwise negative noncontrast CT HEAD for age.   CTA NECK:  1. Atherosclerosis without hemodynamically significant stenosis.  2. Severe stenosis versus occluded LEFT vertebral artery origin with immediate reconstitution, diminutive LEFT vertebral artery.  3. **An incidental finding of potential clinical significance has been found.  Multiple LEFT upper lobe pulmonary nodules including 10 mm spiculated nodule. Recommend contrast-enhanced CT chest on a nonemergent basis to assess for extent of involvement. **    CTA HEAD:  1. No emergent large vessel occlusion or severe stenosis anterior circulation.  2. Severe stenosis versus tandem occlusion diminutive LEFT vertebral artery.   RIGHT vertebral artery is dominant.    CT PERFUSION:  1. Small perfusion defect LEFT parietal lobe/posterior watershed territory suggesting brain at risk.  Aortic Atherosclerosis (ICD10-I70.0) and Emphysema (ICD10-J43.9).   Mr Brain Wo Contrast 11/20/2017 IMPRESSION:  1. No acute intracranial abnormality.  2. Chronic microvascular ischemia and old right corona radiata  lacunar infarct.  3. Loss of the normal left vertebral artery flow void, consistent with the severely diminutive left vertebral artery demonstrated on the earlier CTA.     Ct Head Code Stroke Wo Contrast 11/19/2017 IMPRESSION:  1. No evidence of acute intracranial abnormality.  2. ASPECTS is 10.  3. Mild chronic small vessel ischemic disease and cerebral atrophy.   Chronic right basal ganglia lacunar infarct.    EEG: 11/20/2017 This EEG is abnormal due to diffuse slowing of the waking background. Clinical Correlation of the above findings indicates diffuse cerebral dysfunction that is non-specific in etiology and can be seen with hypoxic/ischemic injury, toxic/metabolic encephalopathies, neurodegenerative disorders, or medication effect.  However, findings may also be due to excessive drowsiness.  Clinical correlation advised.   EEG: 11/21/2017 Impression: This awake and asleep EEG is abnormal due to occasional focal slowing over the left hemisphere, maximal over the left frontocentrotemporal region.  Clinical Correlation of the above findings indicates focal cerebral dysfunction over the left hemisphere region suggestive of underlying structural or physiologic abnormality. The absence of epileptiform discharges does not exclude a clinical diagnosis of epilepsy. Clinical correlation is advised.   [name] Contrast 11/20/2017 IMPRESSION:  1. No acute intracranial abnormality identified. No evidence for acute infarct or hemorrhage status post tPA administration. No imaging findings to suggest acute CNS infection.  2. Stable atrophy with chronic small vessel ischemic disease with old right corona radiata lacunar infarct.  3. Right mastoid effusion.    Dg Chest Port 1 View 11/20/2017 IMPRESSION:  Small area of infiltrate or increased atelectasis in the left lung base, new since the prior CT scan.    Dg Chest Port 1 View 11/21/2017 IMPRESSION: 1. No significant change  from the previous day's study. 2. Left medial lung base opacity is again noted consistent with pneumonia or atelectasis.    TTE  - Left ventricle: The cavity size was normal. Systolic function was   normal. The estimated ejection fraction was in the range of 50%   to 55%. Hypokinesis of the apical myocardium. Doppler parameters   are consistent with abnormal left ventricular relaxation (grade 1   diastolic dysfunction). Doppler parameters are consistent with   high ventricular filling pressure. - Aortic valve: Transvalvular velocity was within the normal range.   There was no stenosis. There was no regurgitation. Valve area   (VTI): 2.8 cm^2. Valve area (Vmax): 2.67 cm^2. Valve area   (Vmean): 2.38 cm^2. - Mitral valve: Transvalvular velocity was within the normal range.   There was no evidence for stenosis. There was mild regurgitation. - Left atrium: The atrium was moderately dilated. - Right ventricle: The cavity size was normal. Wall thickness was   normal. Systolic function was normal. - Right atrium: The atrium was mildly dilated. - Atrial septum: No defect or patent foramen ovale was  identified. - Tricuspid valve: There was no regurgitation.  MRI Cervical spine : No concerning features of meningeal enhancement or cord hyperintensity.Ordinary spondylosis, without features of discitis  PHYSICAL EXAM Vitals:   11/24/17 0600 11/24/17 0700 11/24/17 0800 11/24/17 1200  BP: (!) 149/69 (!) 164/82    Pulse: 74 86    Resp: 19 (!) 21    Temp:   97.7 F (36.5 C) 98.1 F (36.7 C)  TempSrc:   Oral Oral  SpO2: 96% 95%    Weight:      Height:        General - Well nourished, well developed, still obtunded and not following commands.  Ophthalmologic - fundi not visualized due to noncooperation.  Cardiovascular - Regular rate and rhythm.  Neuro - drowsy but arouses easily, not following commands. Eyes closed, but able to open with voice. Nonverbal, not able to name or repeat,    following only few occasional commands. Inconsistently blinking to visual threat bilaterally. Eyes can follow movement bilaterally as commanded, doll's eye present. PREEL. Positive corneal and gag. LUE and LLE move spontaneously and against gravity, RUE 2/5 and RLE 3/5 on pain. DTR 1+ and not cooperative on babinski. Sensation, coordination and gait not tested.   ASSESSMENT/PLAN Mr. Peter Becker is a 83 y.o. male with history of DM, HTN, MI s/p CABG, PVD, and HLD on lovastatin admitted for AMS, confusion, aphasia, wax and waning process. TPA given at OSH.    AMS with intermittent fever - Seizure with Todd paralysis vs. CNS infection -meningoencephalitis presentation concerning for seizure like activity and CSF concerning for viral meningitis    Resultant AMS, nonverbal, agitation, left hemiparesis  MRI  No acute stroke  MRI brain repeat with and without contrast negative  CTA head and neck b/l ICA proximal and right VA origin athero, left VA hypoplastic  2D Echo EF 50-55%  EEG - diffuse slowing no seizure  TSH/B12 and ammonia WNL  LP showed elevated RBC, WBC and protein, concerning for viral meningitis. ID consulted  Blood culture - no growth day 1  Repeat EEG - see above  LDL 47  HgbA1c 7.3  SCDs for VTE prophylaxis  Diet NPO time specified   aspirin 81 mg daily prior to admission, now on No antithrombotic  Therapy recommendations:  pending  Disposition:  Pending  ? pneumonia   CXR showed new small area of infiltration left lung base  Repeat CXR - no significant change - possible pneumonia - not currently on antibiotics.  Has cough with secretion  Intermittent fever  Diabetes  HgbA1c 7.3 goal < 7.0  Uncontrolled  Home meds -glyburide  CBG monitoring  SSI  Hypertension Stable  Long term BP goal normotensive  Hyperlipidemia  Home meds:  lovastatin   LDL 47, goal < 70  Resume once po access  Continue statin at discharge  Other Stroke Risk  Factors  Advanced age  CAD/MI s/p CABG  PVD   Seizure Activity  Now on Depacon - 500 mg IV every 8 hours - level optimal at 71  EEGs 2 - abnormal as noted above   Other Active Problems  Tmax - 100.9 -> 98.2 Saturday  Multiple LEFT upper lobe pulmonary nodules including 10 mm spiculated nodule. Contrast-enhanced CT chest on a nonemergent basis to assess for extent of involvement recommended.  Possible pneumonia - consider antibiotics will call CCM/medical hospitalist team to consult and help manage  Tube feeds started, nutrition consulted  Hospital day # 5  The patient  is likely to gradually improve over the next few days but will need antibiotics for his pneumonia and consolidation. Continue tube feeds and speech therapy to follow to decide on PEG tube versus oral feeding. Long discussion with the patient's wife at the bedside and answered questions about his care. She would prefer full ongoing medical support and even transfer to skilled nursing facility or a PEG tube if necessary. Discussed with Dr. Isidoro Donning and plan to transfer patient out of ICU today and transfer her to medical hospitalist service for further management. Stroke team will continue to follow. Greater than 50% time during this 35 minute visit was spent on counseling and coordination of care about his meningoencephalitis, pneumonia and answering questions.  Delia Heady, MD Medical Director [facility] Pager: [phone] 11/24/2017 2:29 PM   To contact Stroke Continuity provider, please refer to [email or web address]. After hours, contact General Neurology STROKE TEAM PROGRESS NOTE   SUBJECTIVE (INTERVAL HISTORY) His family is not at the bedside. Pt still nonverbal butt following very few commands. No events overnight.   MRI cervical spine. Shows no compression or significant abnormality  Intermittent cough with secretions. Still has right hemiparesis. LP concerning for viral meningitis. ID following.     OBJECTIVE Temp:  [97.7 F (36.5 C)-98.4 F (36.9 C)] 98.1 F (36.7 C) (12/24 1200) Pulse Rate:  [69-87] 86 (12/24 0700) Cardiac Rhythm: Normal sinus rhythm (12/24 0400) Resp:  [16-21] 21 (12/24 0700) BP: (123-164)/(61-82) 164/82 (12/24 0700) SpO2:  [91 %-100 %] 95 % (12/24 0700) Weight:  [133 lb 13.1 oz (60.7 kg)] 133 lb 13.1 oz (60.7 kg) (12/24 0500)  Recent Labs  Lab 11/23/17 1603 11/23/17 2035 11/23/17 2309 11/24/17 0305 11/24/17 0748  GLUCAP 203* 162* 72 154* 230*   Recent Labs  Lab 11/20/17 0728 11/21/17 0540 11/22/17 0514 11/23/17 0354 11/24/17 0307  NA 138 137 138 140 144  K 4.3 3.9 3.9 3.4* 3.3*  CL 104 103 107 109 114*  CO2 21* 23 22 24 24   GLUCOSE 90 104* 97 195* 158*  BUN 11 13 15 18 17   CREATININE 1.12 1.23 1.17 1.06 0.90  CALCIUM 9.2 8.8* 8.3* 8.2* 8.2*   Recent Labs  Lab 11/19/17 1620  AST 24  ALT 17  ALKPHOS 65  BILITOT 0.7  PROT 6.7  ALBUMIN 4.2   Recent Labs  Lab 11/19/17 1620 11/20/17 0728 11/21/17 0540 11/22/17 0514 11/23/17 0354 11/24/17 0307  WBC 6.5 8.3 8.3 8.0 8.1 7.5  NEUTROABS 4.8  --   --   --   --  5.6  HGB 11.7* 11.6* 11.5* 10.9* 10.8* 10.1*  HCT 35.6* 34.9* 34.3* 32.6* 32.0* 29.8*  MCV 94.3 92.1 91.5 91.8 92.0 92.0  PLT 240 232 212 212 203 180   Recent Labs  Lab 11/19/17 1620 11/22/17 0514  TROPONINI 0.05* 0.12*   No results for input(s): LABPROT, INR in the last 72 hours. Recent Labs    11/23/17 1435  COLORURINE YELLOW  LABSPEC 1.025  PHURINE 5.0  GLUCOSEU 50*  HGBUR NEGATIVE  BILIRUBINUR NEGATIVE  KETONESUR 5*  PROTEINUR 100*  NITRITE NEGATIVE  LEUKOCYTESUR NEGATIVE       Component Value Date/Time   CHOL 129 11/20/2017 0559   TRIG 56 11/20/2017 0559   HDL 71 11/20/2017 0559   CHOLHDL 1.8 11/20/2017 0559   VLDL 11 11/20/2017 0559   LDLCALC 47 11/20/2017 0559   Lab Results  Component Value Date   HGBA1C 7.3 (H) 11/20/2017  Component Value Date/Time   LABOPIA NONE DETECTED  11/20/2017 1055   COCAINSCRNUR NONE DETECTED 11/20/2017 1055   LABBENZ NONE DETECTED 11/20/2017 1055   AMPHETMU NONE DETECTED 11/20/2017 1055   THCU NONE DETECTED 11/20/2017 1055   LABBARB NONE DETECTED 11/20/2017 1055    No results for input(s): ETH in the last 168 hours.   IMAGING  I have personally reviewed the radiological images below and agree with the radiology interpretations.   MR Cervical Spine With and Without Contrast - pending   Ct Angio Head and neck and CTP W Or Wo Contrast 11/19/2017 IMPRESSION:  CT HEAD:  1. No acute intracranial process.  2. Old RIGHT basal ganglia lacunar infarct, otherwise negative noncontrast CT HEAD for age.   CTA NECK:  1. Atherosclerosis without hemodynamically significant stenosis.  2. Severe stenosis versus occluded LEFT vertebral artery origin with immediate reconstitution, diminutive LEFT vertebral artery.  3. **An incidental finding of potential clinical significance has been found.  Multiple LEFT upper lobe pulmonary nodules including 10 mm spiculated nodule. Recommend contrast-enhanced CT chest on a nonemergent basis to assess for extent of involvement. **    CTA HEAD:  1. No emergent large vessel occlusion or severe stenosis anterior circulation.  2. Severe stenosis versus tandem occlusion diminutive LEFT vertebral artery.   RIGHT vertebral artery is dominant.    CT PERFUSION:  1. Small perfusion defect LEFT parietal lobe/posterior watershed territory suggesting brain at risk.  Aortic Atherosclerosis (ICD10-I70.0) and Emphysema (ICD10-J43.9).   Mr Brain Wo Contrast 11/20/2017 IMPRESSION:  1. No acute intracranial abnormality.  2. Chronic microvascular ischemia and old right corona radiata lacunar infarct.  3. Loss of the normal left vertebral artery flow void, consistent with the severely diminutive left vertebral artery demonstrated on the earlier CTA.     Ct Head Code Stroke Wo Contrast 11/19/2017 IMPRESSION:  1.  No evidence of acute intracranial abnormality.  2. ASPECTS is 10.  3. Mild chronic small vessel ischemic disease and cerebral atrophy.   Chronic right basal ganglia lacunar infarct.    EEG: 11/20/2017 This EEG is abnormal due to diffuse slowing of the waking background. Clinical Correlation of the above findings indicates diffuse cerebral dysfunction that is non-specific in etiology and can be seen with hypoxic/ischemic injury, toxic/metabolic encephalopathies, neurodegenerative disorders, or medication effect.  However, findings may also be due to excessive drowsiness.  Clinical correlation advised.   EEG: 11/21/2017 Impression: This awake and asleep EEG is abnormal due to occasional focal slowing over the left hemisphere, maximal over the left frontocentrotemporal region.  Clinical Correlation of the above findings indicates focal cerebral dysfunction over the left hemisphere region suggestive of underlying structural or physiologic abnormality. The absence of epileptiform discharges does not exclude a clinical diagnosis of epilepsy. Clinical correlation is advised.   [name] Contrast 11/20/2017 IMPRESSION:  1. No acute intracranial abnormality identified. No evidence for acute infarct or hemorrhage status post tPA administration. No imaging findings to suggest acute CNS infection.  2. Stable atrophy with chronic small vessel ischemic disease with old right corona radiata lacunar infarct.  3. Right mastoid effusion.    Dg Chest Port 1 View 11/20/2017 IMPRESSION:  Small area of infiltrate or increased atelectasis in the left lung base, new since the prior CT scan.    Dg Chest Port 1 View 11/21/2017 IMPRESSION: 1. No significant change from the previous day's study. 2. Left medial lung base opacity is again noted consistent with pneumonia or atelectasis.    TTE  -  Left ventricle: The cavity size was normal. Systolic function was   normal. The estimated ejection  fraction was in the range of 50%   to 55%. Hypokinesis of the apical myocardium. Doppler parameters   are consistent with abnormal left ventricular relaxation (grade 1   diastolic dysfunction). Doppler parameters are consistent with   high ventricular filling pressure. - Aortic valve: Transvalvular velocity was within the normal range.   There was no stenosis. There was no regurgitation. Valve area   (VTI): 2.8 cm^2. Valve area (Vmax): 2.67 cm^2. Valve area   (Vmean): 2.38 cm^2. - Mitral valve: Transvalvular velocity was within the normal range.   There was no evidence for stenosis. There was mild regurgitation. - Left atrium: The atrium was moderately dilated. - Right ventricle: The cavity size was normal. Wall thickness was   normal. Systolic function was normal. - Right atrium: The atrium was mildly dilated. - Atrial septum: No defect or patent foramen ovale was identified. - Tricuspid valve: There was no regurgitation.  MRI Cervical spine : No concerning features of meningeal enhancement or cord hyperintensity.Ordinary spondylosis, without features of discitis  PHYSICAL EXAM Vitals:   11/24/17 0600 11/24/17 0700 11/24/17 0800 11/24/17 1200  BP: (!) 149/69 (!) 164/82    Pulse: 74 86    Resp: 19 (!) 21    Temp:   97.7 F (36.5 C) 98.1 F (36.7 C)  TempSrc:   Oral Oral  SpO2: 96% 95%    Weight:      Height:        General - Well nourished, well developed, still obtunded and not following commands.  Ophthalmologic - fundi not visualized due to noncooperation.  Cardiovascular - Regular rate and rhythm.  Neuro - obtunded, not following commands. Eyes closed, but able to open with voice. Nonverbal, not able to name or repeat,   following only few occasional commands. Inconsistently blinking to visual threat bilaterally. Eyes mainly natural position, not movement bilaterally as command, doll's eye present. PREEL. Positive corneal and gag. LUE and LLE move spontaneously and  against gravity, RUE 2/5 and RLE 3/5 on pain. DTR 1+ and not cooperative on babinski. Sensation, coordination and gait not tested.   ASSESSMENT/PLAN Mr. Rosana FretRalph Sivley is a 83 y.o. male with history of DM, HTN, MI s/p CABG, PVD, and HLD on lovastatin admitted for AMS, confusion, aphasia, wax and waning process. TPA given at OSH.    AMS with intermittent fever - Seizure with Todd paralysis vs. CNS infection -meningoencephalitis presentation concerning for seizure like activity and CSF concerning for viral meningitis    Resultant AMS, nonverbal, agitation, left hemiparesis  MRI  No acute stroke  MRI brain repeat with and without contrast negative  CTA head and neck b/l ICA proximal and right VA origin athero, left VA hypoplastic  2D Echo EF 50-55%  EEG - diffuse slowing no seizure  TSH/B12 and ammonia WNL  LP showed elevated RBC, WBC and protein, concerning for viral meningitis. ID consulted  Blood culture - no growth day 1  Repeat EEG - see above  LDL 47  HgbA1c 7.3  SCDs for VTE prophylaxis  Diet NPO time specified   aspirin 81 mg daily prior to admission, now on No antithrombotic  Therapy recommendations:  pending  Disposition:  Pending  ? pneumonia   CXR showed new small area of infiltration left lung base  Repeat CXR - no significant change - possible pneumonia - not currently on antibiotics.  Has cough with secretion  Intermittent fever  Diabetes  HgbA1c 7.3 goal < 7.0  Uncontrolled  Home meds -glyburide  CBG monitoring  SSI  Hypertension Stable  Long term BP goal normotensive  Hyperlipidemia  Home meds:  lovastatin   LDL 47, goal < 70  Resume once po access  Continue statin at discharge  Other Stroke Risk Factors  Advanced age  CAD/MI s/p CABG  PVD   Seizure Activity  Now on Depacon - 500 mg IV every 8 hours - level optimal at 71  EEGs 2 - abnormal as noted above   Other Active Problems  Tmax - 100.9 -> 98.2  Saturday  Multiple LEFT upper lobe pulmonary nodules including 10 mm spiculated nodule. Contrast-enhanced CT chest on a nonemergent basis to assess for extent of involvement recommended.  Possible pneumonia - consider antibiotics will call CCM/medical hospitalist team to consult and help manage  Tube feeds started, nutrition consulted  Hospital day # 5  This patient is critically ill due to AMS, s/p tPA, agitation and at significant risk of neurological worsening, death form stroke, hemorrhage, seizure, status epilepticus, sepsis. This patient's care requires constant monitoring of vital signs, hemodynamics, respiratory and cardiac monitoring, review of multiple databases, neurological assessment, discussion with family, other specialists and medical decision making of high complexity. I had long discussion with wife at bedside, updated pt current condition, treatment plan and potential prognosis. She expressed understanding and appreciation. I also discussed with Dr. Ninetta Lights. I spent 30 minutes of neurocritical care time in the care of this patient.  Delia Heady, MD Medical Director [facility] Pager: [phone] 11/24/2017 2:29 PM   To contact Stroke Continuity provider, please refer to [email or web address]. After hours, contact General Neurology

## 2017-11-24 NOTE — Progress Notes (Signed)
Occupational Therapy Treatment Patient Details Name: Peter Becker MRN: [id number] DOB: Aug 14, 1929 Today's Date: 11/24/2017    History of present illness Peter Becker is an 83 y.o. male with past medical history of diabetes, hypertension, peripheral arterial disease, coronary artery disease who presents to [facility] hospital with sudden onset aphasia and right sided weakness, CT and MRI negative. LP concerning for viral menigitis, pt with PNA   OT comments  Pt progressing towards established OT goals.  Pt donning gown like jacket with Max A and cues while sitting at EOB without support. Pt requiring increased time and cues to attend to object/people at midline or to the R[employer] family for motivated to attend to the R during ADLs and functional mobility. Will continue to follow acutely. Continue to recommend dc to CIR.    Follow Up Recommendations  CIR;Supervision/Assistance - 24 hour    Equipment Recommendations  Other (comment)(TBD next venue)    Recommendations for Other Services Rehab consult    Precautions / Restrictions Precautions Precautions: Fall Restrictions Weight Bearing Restrictions: No       Mobility Bed Mobility Overal bed mobility: Needs Assistance Bed Mobility: Rolling;Sidelying to Sit Rolling: Min assist Sidelying to sit: Mod assist       General bed mobility comments: cues for sequence rolling with mod assist to elevate and bring legs off of bed  Transfers Overall transfer level: Needs assistance Equipment used: 2 person hand held assist Transfers: Sit to/from Stand Sit to Stand: Min assist;+2 physical assistance         General transfer comment: min assist to stand from bed with cues for sequence and assist to initiate anterior translation, min assist to sit at chair with cues for hand placement    Balance Overall balance assessment: Needs assistance Sitting-balance support: Single extremity supported;Feet supported Sitting balance-Leahy  Scale: Fair Sitting balance - Comments: reliant on single UE support with at times no UE support   Standing balance support: Bilateral upper extremity supported Standing balance-Leahy Scale: Zero Standing balance comment: Reliant on Bil UE HHA; pt with right lateral lean which increased with more time up on his feet                           ADL either performed or assessed with clinical judgement   ADL Overall ADL's : Needs assistance/impaired Eating/Feeding: NPO   Grooming: Wash/dry face;Minimal assistance Grooming Details (indicate cue type and reason): supported sitting and requiring cues to attend to wash cloth at midline         Upper Body Dressing : Sitting;Maximal assistance;Cueing for sequencing(+2 safety) Upper Body Dressing Details (indicate cue type and reason): Pt requiring Max cues to bring LUE into sleeve and then Max A to bring over L shoulder and around back. Pt requiring Max cues to attend to R hand and pick up RUE with LUE to don gown.     Toilet Transfer: +2 for physical assistance;Moderate assistance;Ambulation(Two person hand held A) Toilet Transfer Details (indicate cue type and reason): Simulated to recliner. Mod A +2 to maintain standing balance and perform functional mobility to recliner.         Functional mobility during ADLs: Moderate assistance;+2 for physical assistance General ADL Comments: Pt continues to have R inattention/neglect. focused session on attending to midline/r side during ADLs and fucnitoanl mobility.     Vision   Additional Comments: head and eyes to right; can come to midline with cues and increased time  Perception     Praxis      Cognition Arousal/Alertness: Awake/alert Behavior During Therapy: Flat affect Overall Cognitive Status: Impaired/Different from baseline Area of Impairment: Following commands;Safety/judgement;Awareness;Problem solving;Attention                   Current Attention Level:  Sustained   Following Commands: Follows one step commands with increased time Safety/Judgement: Decreased awareness of safety;Decreased awareness of deficits Awareness: Intellectual Problem Solving: Slow processing;Decreased initiation;Difficulty sequencing;Requires verbal cues;Requires tactile cues General Comments: Right inattention. Requiring Max visual and verbal cues to attend to objects and track to midline.         Exercises     Shoulder Instructions       General Comments Wife and daughter present throughout and very supportive    Pertinent Vitals/ Pain       Pain Assessment: No/denies pain Faces Pain Scale: No hurt Pain Location: pt unable to state or localize,  without grimace or signs of pain  Home Living                                          Prior Functioning/Environment              Frequency  Min 3X/week        Progress Toward Goals  OT Goals(current goals can now be found in the care plan section)  Progress towards OT goals: Progressing toward goals  Acute Rehab OT Goals Patient Stated Goal: pt unable, wife would like him home OT Goal Formulation: With family Time For Goal Achievement: 12/06/17 Potential to Achieve Goals: Good ADL Goals Pt Will Perform Grooming: with min assist Pt Will Perform Upper Body Bathing: with mod assist Pt Will Transfer to Toilet: with mod assist;bedside commode Additional ADL Goal #1: Pt will be Mod A to come up to sit on right or left side of bed Additional ADL Goal #2: Pt will be able to attend visually past midline to right with min VCs Additional ADL Goal #3: Pt will be min guard A sitting EOB for 3 minutes in prep for basic ADLs and transfers  Plan Discharge plan remains appropriate    Co-evaluation    PT/OT/SLP Co-Evaluation/Treatment: Yes Reason for Co-Treatment: Necessary to address cognition/behavior during functional activity;Complexity of the patient's impairments (multi-system  involvement);For patient/therapist safety   OT goals addressed during session: ADL's and self-care SLP goals addressed during session: Cognition;Communication    AM-PAC PT "6 Clicks" Daily Activity     Outcome Measure   Help from another person eating meals?: Total Help from another person taking care of personal grooming?: A Lot Help from another person toileting, which includes using toliet, bedpan, or urinal?: Total Help from another person bathing (including washing, rinsing, drying)?: Total Help from another person to put on and taking off regular upper body clothing?: A Lot Help from another person to put on and taking off regular lower body clothing?: Total 6 Click Score: 8    End of Session Equipment Utilized During Treatment: Gait belt  OT Visit Diagnosis: Unsteadiness on feet (R26.81);Other abnormalities of gait and mobility (R26.89);Muscle weakness (generalized) (M62.81);Low vision, both eyes (H54.2);Cognitive communication deficit (R41.841);Hemiplegia and hemiparesis Hemiplegia - Right/Left: Right Hemiplegia - dominant/non-dominant: Dominant   Activity Tolerance Patient tolerated treatment well   Patient Left in chair;with call bell/phone within reach;with chair alarm set;with family/visitor present;with restraints reapplied(with SLP)  Nurse Communication Mobility status        Time: 8413-24401039-1108 OT Time Calculation (min): 29 min  Charges: OT General Charges $OT Visit: 1 Visit OT Treatments $Self Care/Home Management : 8-22 mins  Westyn Driggers MSOT, OTR/L Acute Rehab Pager: [phone] Office: [phone]   Theodoro GristCharis M Barry [place] 11/24/2017, 12:51 PM

## 2017-11-24 NOTE — Progress Notes (Signed)
  Speech Language Pathology Treatment: Cognitive-Linquistic  Patient Details Name: Peter Becker MRN: [id number] DOB: August 14, 1929 Today's Date: 11/24/2017 Time: 7829-56211100-1128 SLP Time Calculation (min) (ACUTE ONLY): 28 min  Assessment / Plan / Recommendation Clinical Impression  Pt demonstrates excellent improvement in arousal and attention today. Assisted PT and OT in mobility by utilizing wife and therapeutic agent for attention to midline during ambulation. Pt sustained eye contact with wife, slightly left of midline for 20-30 second intervals, increased visual and verbal cues needed in more distracting environments. Back in chair pt was able to sustain gaze past midline to right and attend to verbal and functional tasks. Pt required moderate tactile and visual cues to nod head Yes, show thumbs up/down. Pt nodded head Y in response to 8 questions. Pt could not shake head no despite max cues and thus could not respond accurately. Pt also could not use thumbs up down for communication as he was less than 50% accurate. SLP attempted max visual, phonemic, and melodic intonation cues for pt to approximate articulation of name, counting, and bilabial syllable with no success. Pt only phonated with no lingual or labial articulation. Discussed strategies with wife. Will continue efforts.   HPI HPI: Mr.Peter Irvinis a 83 y.o.malewith history of DM, HTN, MI s/p CABG, PVD, HLD on lovastatin admitted for AMS, confusion, aphasia, wax and waning process. TPA given at OSH. MRI shows no acute CVA, but MD note states Seizure with [name] paralysisvs. CNS infection - presentation concerning for seizure like activity and CSF concerning for viral meningitis      SLP Plan  Continue with current plan of care       Recommendations                   General recommendations: Rehab consult Oral Care Recommendations: Oral care QID Follow up Recommendations: Inpatient Rehab SLP Visit Diagnosis: Cognitive  communication deficit (H08.657(R41.841) Plan: Continue with current plan of care       GO               [facility] Surgery CenterBonnie Aralynn Brake, MA CCC-SLP [phone]  Claudine MoutonDeBlois, Milledge Gerding Caroline 11/24/2017, 11:38 AM

## 2017-11-24 NOTE — Progress Notes (Signed)
Physical Therapy Treatment Patient Details Name: Peter Becker Dotts MRN: [id number] DOB: 24-Jan-1929 Today's Date: 11/24/2017    History of Present Illness Peter Becker Mander is an 83 y.o. male with past medical history of diabetes, hypertension, peripheral arterial disease, coronary artery disease who presents to [facility] hospital with sudden onset aphasia and right sided weakness, CT and MRI negative. LP concerning for viral menigitis, pt with PNA    PT Comments    Pt progressing well with balance, cognition and mobility. Pt able to tolerate increased gait today with increased stance on RLE and ability to attend to visual cues with 2 person assist for balance with gait. Pt following simple one step commands grossly 90% of the time with increased time. Pt continues to be appropriate for CIR. Will continue to follow.    SpO2 93% on RA   Follow Up Recommendations  CIR;Supervision/Assistance - 24 hour     Equipment Recommendations       Recommendations for Other Services       Precautions / Restrictions Precautions Precautions: Fall    Mobility  Bed Mobility Overal bed mobility: Needs Assistance Bed Mobility: Rolling;Sidelying to Sit Rolling: Min assist Sidelying to sit: Mod assist       General bed mobility comments: cues for sequence rolling with mod assist to elevate and bring legs off of bed  Transfers Overall transfer level: Needs assistance   Transfers: Sit to/from Stand Sit to Stand: Min assist;+2 physical assistance         General transfer comment: min assist to stand from bed with cues for sequence and assist to initiate anterior translation, min assist to sit at chair with cues for hand placement  Ambulation/Gait Ambulation/Gait assistance: Max assist;+2 physical assistance Ambulation Distance (Feet): 300 Feet Assistive device: 2 person hand held assist Gait Pattern/deviations: Narrow base of support;Step-through pattern   Gait velocity interpretation: Below normal  speed for age/gender General Gait Details: pt with narrow BOS with step through pattern with left lean today with max assist to achieve midline and prevent leaning on therapist for balance. Pt with bil HHA with additional person for chair and IV. Max cues throughout for visual attention to midline as well as attention to task and continued gait   Stairs            Wheelchair Mobility    Modified Rankin (Stroke Patients Only) Modified Rankin (Stroke Patients Only) Pre-Morbid Rankin Score: No symptoms Modified Rankin: Severe disability     Balance Overall balance assessment: Needs assistance   Sitting balance-Leahy Scale: Fair       Standing balance-Leahy Scale: Zero                              Cognition Arousal/Alertness: Awake/alert Behavior During Therapy: Flat affect Overall Cognitive Status: Impaired/Different from baseline Area of Impairment: Following commands;Safety/judgement;Awareness;Problem solving;Attention                   Current Attention Level: Sustained   Following Commands: Follows one step commands with increased time;Follows one step commands consistently Safety/Judgement: Decreased awareness of safety;Decreased awareness of deficits Awareness: Intellectual Problem Solving: Slow processing;Decreased initiation;Difficulty sequencing;Requires verbal cues;Requires tactile cues General Comments: Right inattention      Exercises      General Comments        Pertinent Vitals/Pain Pain Assessment: No/denies pain Faces Pain Scale: No hurt Pain Location: pt unable to state or localize,  without grimace or signs  of pain    Home Living                      Prior Function            PT Goals (current goals can now be found in the care plan section) Progress towards PT goals: Progressing toward goals    Frequency    Min 3X/week      PT Plan Current plan remains appropriate    Co-evaluation PT/OT/SLP  Co-Evaluation/Treatment: Yes Reason for Co-Treatment: Necessary to address cognition/behavior during functional activity     SLP goals addressed during session: Cognition;Communication    AM-PAC PT "6 Clicks" Daily Activity  Outcome Measure  Difficulty turning over in bed (including adjusting bedclothes, sheets and blankets)?: Unable Difficulty moving from lying on back to sitting on the side of the bed? : Unable Difficulty sitting down on and standing up from a chair with arms (e.g., wheelchair, bedside commode, etc,.)?: Unable Help needed moving to and from a bed to chair (including a wheelchair)?: A Lot Help needed walking in hospital room?: A Lot Help needed climbing 3-5 steps with a railing? : Total 6 Click Score: 8    End of Session Equipment Utilized During Treatment: Gait belt Activity Tolerance: Patient tolerated treatment well Patient left: in chair;with call bell/phone within reach;with chair alarm set;with family/visitor present Nurse Communication: Mobility status;Precautions PT Visit Diagnosis: Unsteadiness on feet (R26.81);Other abnormalities of gait and mobility (R26.89);Other symptoms and signs involving the nervous system (R29.898)     Time: [id number] PT Time Calculation (min) (ACUTE ONLY): 28 min  Charges:  $Gait Training: 8-22 mins                    G Codes:       Delaney MeigsMaija Tabor Jasnoor Trussell, PT [phone]    Hanford Lust B Helem Reesor 11/24/2017, 12:13 PM

## 2017-11-24 NOTE — Progress Notes (Signed)
Consult progress note                                                                               Patient Demographics  Peter Becker, is a 83 y.o. male, DOB - 1929/07/15, RUE:[id number]  Admit date - 11/19/2017   Admitting Physician Caryl PinaEric Lindzen, MD  Outpatient Primary MD for the patient is Danella PentonMiller, Mark F, MD  Outpatient specialists:   LOS - 5  days   Medical records reviewed and are as summarized below:    No chief complaint on file.      Brief summary   Peter FretRalph Becker is an 83 y.o. male with a past medical history of diabetes, hypertension, peripheral arterial disease, and  coronary artery disease who was admitted for CVA evaluation after onset of weakness and aphasia at 2pm on 12/19.  Pt has had continued weakness and aphasia.Mri showed no evidence of acute stroke.  Pt has been seen by Infectious disease for LP due to possible concern for meningitis.  Pt had a chest xray today that shows progressive left lower lobe pneumonia [facility] medicine service was consulted for coughing, left lower lobe pneumonia.    Assessment & Plan    Active Problems:   Ischemic stroke (HCC) -Management per neurology, primary service -Agree with palliative care consult for goals of care    Altered mental status -Possibly due to acute stroke, seizure, meningoencephalitis -Patient is alert and awake, recognizes his family member at the bedside  H CAP -Possibly has a component of aspiration, on tube feeds, secretions with coughing -Continue broad-spectrum antibiotics, IV vancomycin and cefepime, if able to follow commands will need SLP evaluation -CT chest showed consolidation in the left lower lobe compatible with pneumonia, small left pleural effusion, numerous small scattered pulmonary nodules 5 mm or less in size, stable since prior studies in 2016, benign    Meningoencephalitis -During this hospitalization, LP showed elevated RBCs, WBC and protein concerning for viral meningitis.  ID  was consulted -Continue acyclovir    Seizure (HCC) -EEG showed diffuse slowing no seizures, ammonia within normal limits, TSH, B12 normal -Currently on Depakote, management per neurology     Benign essential HTN -Currently stable    Diabetes mellitus type 2 in nonobese (HCC) -Continue sliding scale insulin, avoid hypoglycemia  Hypokalemia -Replace  Code Status: DNR DVT Prophylaxis:   SCD's Family Communication: Discussed in detail with the patient, all imaging results, lab results explained to the patient's wife    Disposition Plan: per primary service   Time Spent in minutes  35 minutes  Procedures:    Antimicrobials:   IV vancomycin 12/23 >  IV cefepime 12/23 >   Medications  Scheduled Meds: . chlorhexidine  15 mL Mouth Rinse BID  . insulin aspart  0-9 Units Subcutaneous Q4H  . mouth rinse  15 mL Mouth Rinse q12n4p  . pantoprazole (PROTONIX) IV  40 mg Intravenous QHS   Continuous Infusions: . sodium chloride 75 mL/hr at 11/24/17 0700  . ceFEPime (MAXIPIME) IV Stopped (11/23/17 1448)  . feeding supplement (JEVITY 1.2 CAL) 1,000 mL (11/24/17 0939)  . valproic acid (DEPACON) IVPB 500 mg (11/24/17 0631)  .  vancomycin Stopped (11/24/17 0316)   PRN Meds:.acetaminophen, labetalol   Antibiotics   Anti-infectives (From admission, onward)   Start     Dose/Rate Route Frequency Ordered Stop   11/23/17 1430  vancomycin (VANCOCIN) 500 mg in sodium chloride 0.9 % 100 mL IVPB     500 mg 100 mL/hr over 60 Minutes Intravenous Every 12 hours 11/23/17 1314     11/23/17 1400  ceFEPIme (MAXIPIME) 1 g in dextrose 5 % 50 mL IVPB  Status:  Discontinued     1 g 100 mL/hr over 30 Minutes Intravenous Every 8 hours 11/23/17 1308 11/23/17 1310   11/23/17 1400  ceFEPIme (MAXIPIME) 1 g in dextrose 5 % 50 mL IVPB     1 g 100 mL/hr over 30 Minutes Intravenous Every 24 hours 11/23/17 1311     11/20/17 2200  acyclovir (ZOVIRAX) 560 mg in dextrose 5 % 100 mL IVPB  Status:   Discontinued     10 mg/kg  56.2 kg 111.2 mL/hr over 60 Minutes Intravenous Every 12 hours 11/20/17 2118 11/22/17 1300        Subjective:   [name] was seen and examined today.  Alert and awake, wife at the bedside, upper airway secretions.  Patient unable to provide any review of systems.  No fevers.  No acute events overnight.    Objective:   Vitals:   11/24/17 0500 11/24/17 0600 11/24/17 0700 11/24/17 0800  BP: (!) 142/66 (!) 149/69 (!) 164/82   Pulse: 76 74 86   Resp: 19 19 (!) 21   Temp:    97.7 F (36.5 C)  TempSrc:    Oral  SpO2: 93% 96% 95%   Weight: 60.7 kg (133 lb 13.1 oz)     Height:        Intake/Output Summary (Last 24 hours) at 11/24/2017 1106 Last data filed at 11/24/2017 0700 Gross per 24 hour  Intake 3265 ml  Output 650 ml  Net 2615 ml     Wt Readings from Last 3 Encounters:  11/24/17 60.7 kg (133 lb 13.1 oz)  11/19/17 56.2 kg (124 lb)  01/25/17 60.8 kg (134 lb)     Exam  General: Alert and awake, NAD, ill-appearing, recognizes his wife at bedside  Eyes:   HEENT:  Atraumatic, normocephalic  Cardiovascular: S1 S2 auscultated, no rubs, murmurs or gallops. Regular rate and rhythm.  Respiratory: Upper airway secretions with bilateral rhonchi  Gastrointestinal: Soft, nontender, nondistended, + bowel sounds  Ext: no pedal edema bilaterally  Neuro: follow few commands, did not lift up right upper extremity however left side moving spontaneously against gravity, right lower extremity 3/5  Musculoskeletal: No digital cyanosis, clubbing  Skin: No rashes  Psych: ill-appearing, flat affect   Data Reviewed:  I have personally reviewed following labs and imaging studies  Micro Results Recent Results (from the past 240 hour(s))  MRSA PCR Screening     Status: None   Collection Time: 11/19/17  9:24 PM  Result Value Ref Range Status   MRSA by PCR NEGATIVE NEGATIVE Final    Comment:        The GeneXpert MRSA Assay (FDA approved for  NASAL specimens only), is one component of a comprehensive MRSA colonization surveillance program. It is not intended to diagnose MRSA infection nor to guide or monitor treatment for MRSA infections.   Culture, blood (routine x 2)     Status: None (Preliminary result)   Collection Time: 11/20/17 11:05 AM  Result Value Ref Range Status  Specimen Description BLOOD LEFT ANTECUBITAL  Final   Special Requests IN PEDIATRIC BOTTLE Blood Culture adequate volume  Final   Culture NO GROWTH 4 DAYS  Final   Report Status PENDING  Incomplete  Culture, blood (routine x 2)     Status: None (Preliminary result)   Collection Time: 11/20/17 11:10 AM  Result Value Ref Range Status   Specimen Description BLOOD LEFT ANTECUBITAL  Final   Special Requests IN PEDIATRIC BOTTLE Blood Culture adequate volume  Final   Culture NO GROWTH 4 DAYS  Final   Report Status PENDING  Incomplete  CSF culture     Status: None   Collection Time: 11/20/17  6:46 PM  Result Value Ref Range Status   Specimen Description CSF  Final   Special Requests NONE  Final   Gram Stain   Final    WBC PRESENT, PREDOMINANTLY PMN NO ORGANISMS SEEN CYTOSPIN SMEAR    Culture NO GROWTH 3 DAYS  Final   Report Status 11/24/2017 FINAL  Final  Anaerobic culture     Status: None (Preliminary result)   Collection Time: 11/20/17  6:46 PM  Result Value Ref Range Status   Specimen Description CSF  Final   Special Requests NONE  Final   Culture   Final    NO ANAEROBES ISOLATED; CULTURE IN PROGRESS FOR 5 DAYS   Report Status PENDING  Incomplete  Culture, blood (routine x 2) Call MD if unable to obtain prior to antibiotics being given     Status: None (Preliminary result)   Collection Time: 11/23/17  3:35 PM  Result Value Ref Range Status   Specimen Description BLOOD LEFT ANTECUBITAL  Final   Special Requests IN PEDIATRIC BOTTLE Blood Culture adequate volume  Final   Culture NO GROWTH < 24 HOURS  Final   Report Status PENDING  Incomplete    Culture, blood (routine x 2) Call MD if unable to obtain prior to antibiotics being given     Status: None (Preliminary result)   Collection Time: 11/23/17  3:41 PM  Result Value Ref Range Status   Specimen Description BLOOD RIGHT ANTECUBITAL  Final   Special Requests   Final    BOTTLES DRAWN AEROBIC ONLY Blood Culture adequate volume   Culture NO GROWTH < 24 HOURS  Final   Report Status PENDING  Incomplete    Radiology Reports Ct Angio Head W Or Wo Contrast  Result Date: 11/19/2017 CLINICAL DATA:  Acute onset dizziness and aphasia at 1400 hours. RIGHT-sided weakness. Assess stroke. EXAM: CT ANGIOGRAPHY HEAD AND NECK CT PERFUSION BRAIN TECHNIQUE: Multidetector CT imaging of the head and neck was performed using the standard protocol during bolus administration of intravenous contrast. Multiplanar CT image reconstructions and MIPs were obtained to evaluate the vascular anatomy. Carotid stenosis measurements (when applicable) are obtained utilizing NASCET criteria, using the distal internal carotid diameter as the denominator. Multiphase CT imaging of the brain was performed following IV bolus contrast injection. Subsequent parametric perfusion maps were calculated using RAPID software. CONTRAST:  ISOVUE-370 IOPAMIDOL (ISOVUE-370) INJECTION 76% COMPARISON:  CT HEAD November 19, 2017 at 1627 hours FINDINGS: CT HEAD FINDINGS BRAIN: No intraparenchymal hemorrhage, mass effect nor midline shift. The ventricles and sulci are normal for age. Patchy supratentorial white matter hypodensities within normal range for patient's age, though non-specific are most compatible with chronic small vessel ischemic disease. Punctate LEFT frontal lobe calcification. Old RIGHT basal ganglia lacunar infarct. No acute large vascular territory infarcts. No abnormal extra-axial fluid collections.  Basal cisterns are patent. VASCULAR: Moderate calcific atherosclerosis of the carotid siphons in RIGHT vertebral artery. SKULL:  No skull fracture. No significant scalp soft tissue swelling. SINUSES/ORBITS: Mild lobulated paranasal sinus mucosal thickening without air-fluid levels. Mastoid air cells are well aerated.The included ocular globes and orbital contents are non-suspicious. Status post bilateral ocular lens implants. OTHER: None. CTA NECK AORTIC ARCH: Normal appearance of the thoracic arch, normal branch pattern. Moderate calcific atherosclerosis aortic arch. The origins of the innominate, left Common carotid artery and subclavian artery are widely patent. RIGHT CAROTID SYSTEM: Common carotid artery is widely patent, mild calcific atherosclerosis. Moderate calcific atherosclerosis carotid bifurcation without hemodynamically significant stenosis by NASCET criteria. Normal appearance of the internal carotid artery. LEFT CAROTID SYSTEM: Common carotid artery is widely patent, mild calcific atherosclerosis. Moderate calcific atherosclerosis without hemodynamically significant stenosis by NASCET criteria. Focal luminal irregularity LEFT internal carotid artery origin, favoring sequelae of atherosclerosis, less likely pseudo aneurysm. VERTEBRAL ARTERIES:RIGHT vertebral artery is dominant. Calcific atherosclerosis resulting in severe stenosis RIGHT vertebral artery. The severe stenosis versus occluded LEFT P1 origin with immediate reconstitution, at diminutive thready vessel. SKELETON: No acute osseous process though bone windows have not been submitted. Patient is edentulous. Multilevel moderate to severe RIGHT cervical facet arthropathy. OTHER NECK: Soft tissues of the neck are nonacute though, not tailored for evaluation. UPPER CHEST: Tiny scattered centrilobular ground-glass nodules most compatible with respiratory bronchiolitis. Centrilobular emphysema. Multiple LEFT upper lobe pulmonary nodules including 10 mm spiculated nodule (series 15, image 221/280). LEFT upper lobe calcified granuloma. No superior mediastinal lymphadenopathy.  OTHER: None. CTA HEAD ANTERIOR CIRCULATION: Patent cervical internal carotid arteries, petrous, cavernous and supra clinoid internal carotid arteries. Atherosclerosis resulting in mild stenosis RIGHT supraclinoid internal carotid artery. Patent anterior communicating artery. Patent anterior and middle cerebral arteries. No large vessel occlusion, significant stenosis, contrast extravasation or aneurysm. POSTERIOR CIRCULATION: Patent RIGHT vertebral artery, vertebrobasilar junction and basilar artery, as well as main branch vessels. Extremely diminutive LEFT vertebral artery, with tandem severe stenosis versus occlusion and reconstitution. Bilateral posterior inferior cerebellar arteries are patent. Small LEFT P1 segment, robust LEFT posterior communicating artery. Patent posterior cerebral arteries. No large vessel occlusion, significant stenosis, contrast extravasation or aneurysm. VENOUS SINUSES: Major dural venous sinuses are patent though not tailored for evaluation on this angiographic examination. ANATOMIC VARIANTS: None. DELAYED PHASE: Not performed. MIP images reviewed. CT Brain Perfusion Findings: CBF (<30%) Volume: 0mL Perfusion (Tmax>6.0s) volume: 9mL Mismatch Volume: 9mL Infarction Location:LEFT posterior parietal lobe. Generalized prolonged T-max LEFT MCA and posterior watershed territory with preserved flow, potentially from ICA stenosis though, preserved LEFT ACA profusion. IMPRESSION: CT HEAD: 1. No acute intracranial process. 2. Old RIGHT basal ganglia lacunar infarct, otherwise negative noncontrast CT HEAD for age. CTA NECK: 1. Atherosclerosis without hemodynamically significant stenosis. 2. Severe stenosis versus occluded LEFT vertebral artery origin with immediate reconstitution, diminutive LEFT vertebral artery. 3. **An incidental finding of potential clinical significance has been found. Multiple LEFT upper lobe pulmonary nodules including 10 mm spiculated nodule. Recommend contrast-enhanced CT  chest on a nonemergent basis to assess for extent of involvement. ** CTA HEAD: 1. No emergent large vessel occlusion or severe stenosis anterior circulation. 2. Severe stenosis versus tandem occlusion diminutive LEFT vertebral artery. RIGHT vertebral artery is dominant. CT PERFUSION: 1. Small perfusion defect LEFT parietal lobe/posterior watershed territory suggesting brain at risk. Acute findings discussed with and reconfirmed by Dr.Lindzen, Neurology on 11/19/2017 at 6:35 pm. Aortic Atherosclerosis (ICD10-I70.0) and Emphysema (ICD10-J43.9). Electronically Signed   By: Michel Santee.D.  On: 11/19/2017 18:36   Dg Chest 2 View  Result Date: 11/23/2017 CLINICAL DATA:  History of cerebral infarction.  Pneumonia. EXAM: CHEST  2 VIEW COMPARISON:  11/21/2017 FINDINGS: Stable mild cardiac enlargement. Feeding tube present extending below the diaphragm. Lungs show progressive airspace disease and consolidation of the left lower lobe consistent with pneumonia. There may be some associated left pleural fluid. No edema or pneumothorax. IMPRESSION: Progressive left lower lobe pneumonia. There may be some associated left pleural fluid. Electronically Signed   By: Irish Lack M.D.   On: 11/23/2017 11:13   Ct Angio Neck W Or Wo Contrast  Result Date: 11/19/2017 CLINICAL DATA:  Acute onset dizziness and aphasia at 1400 hours. RIGHT-sided weakness. Assess stroke. EXAM: CT ANGIOGRAPHY HEAD AND NECK CT PERFUSION BRAIN TECHNIQUE: Multidetector CT imaging of the head and neck was performed using the standard protocol during bolus administration of intravenous contrast. Multiplanar CT image reconstructions and MIPs were obtained to evaluate the vascular anatomy. Carotid stenosis measurements (when applicable) are obtained utilizing NASCET criteria, using the distal internal carotid diameter as the denominator. Multiphase CT imaging of the brain was performed following IV bolus contrast injection. Subsequent  parametric perfusion maps were calculated using RAPID software. CONTRAST:  ISOVUE-370 IOPAMIDOL (ISOVUE-370) INJECTION 76% COMPARISON:  CT HEAD November 19, 2017 at 1627 hours FINDINGS: CT HEAD FINDINGS BRAIN: No intraparenchymal hemorrhage, mass effect nor midline shift. The ventricles and sulci are normal for age. Patchy supratentorial white matter hypodensities within normal range for patient's age, though non-specific are most compatible with chronic small vessel ischemic disease. Punctate LEFT frontal lobe calcification. Old RIGHT basal ganglia lacunar infarct. No acute large vascular territory infarcts. No abnormal extra-axial fluid collections. Basal cisterns are patent. VASCULAR: Moderate calcific atherosclerosis of the carotid siphons in RIGHT vertebral artery. SKULL: No skull fracture. No significant scalp soft tissue swelling. SINUSES/ORBITS: Mild lobulated paranasal sinus mucosal thickening without air-fluid levels. Mastoid air cells are well aerated.The included ocular globes and orbital contents are non-suspicious. Status post bilateral ocular lens implants. OTHER: None. CTA NECK AORTIC ARCH: Normal appearance of the thoracic arch, normal branch pattern. Moderate calcific atherosclerosis aortic arch. The origins of the innominate, left Common carotid artery and subclavian artery are widely patent. RIGHT CAROTID SYSTEM: Common carotid artery is widely patent, mild calcific atherosclerosis. Moderate calcific atherosclerosis carotid bifurcation without hemodynamically significant stenosis by NASCET criteria. Normal appearance of the internal carotid artery. LEFT CAROTID SYSTEM: Common carotid artery is widely patent, mild calcific atherosclerosis. Moderate calcific atherosclerosis without hemodynamically significant stenosis by NASCET criteria. Focal luminal irregularity LEFT internal carotid artery origin, favoring sequelae of atherosclerosis, less likely pseudo aneurysm. VERTEBRAL ARTERIES:RIGHT  vertebral artery is dominant. Calcific atherosclerosis resulting in severe stenosis RIGHT vertebral artery. The severe stenosis versus occluded LEFT P1 origin with immediate reconstitution, at diminutive thready vessel. SKELETON: No acute osseous process though bone windows have not been submitted. Patient is edentulous. Multilevel moderate to severe RIGHT cervical facet arthropathy. OTHER NECK: Soft tissues of the neck are nonacute though, not tailored for evaluation. UPPER CHEST: Tiny scattered centrilobular ground-glass nodules most compatible with respiratory bronchiolitis. Centrilobular emphysema. Multiple LEFT upper lobe pulmonary nodules including 10 mm spiculated nodule (series 15, image 221/280). LEFT upper lobe calcified granuloma. No superior mediastinal lymphadenopathy. OTHER: None. CTA HEAD ANTERIOR CIRCULATION: Patent cervical internal carotid arteries, petrous, cavernous and supra clinoid internal carotid arteries. Atherosclerosis resulting in mild stenosis RIGHT supraclinoid internal carotid artery. Patent anterior communicating artery. Patent anterior and middle cerebral arteries. No large  vessel occlusion, significant stenosis, contrast extravasation or aneurysm. POSTERIOR CIRCULATION: Patent RIGHT vertebral artery, vertebrobasilar junction and basilar artery, as well as main branch vessels. Extremely diminutive LEFT vertebral artery, with tandem severe stenosis versus occlusion and reconstitution. Bilateral posterior inferior cerebellar arteries are patent. Small LEFT P1 segment, robust LEFT posterior communicating artery. Patent posterior cerebral arteries. No large vessel occlusion, significant stenosis, contrast extravasation or aneurysm. VENOUS SINUSES: Major dural venous sinuses are patent though not tailored for evaluation on this angiographic examination. ANATOMIC VARIANTS: None. DELAYED PHASE: Not performed. MIP images reviewed. CT Brain Perfusion Findings: CBF (<30%) Volume: 0mL Perfusion  (Tmax>6.0s) volume: 9mL Mismatch Volume: 9mL Infarction Location:LEFT posterior parietal lobe. Generalized prolonged T-max LEFT MCA and posterior watershed territory with preserved flow, potentially from ICA stenosis though, preserved LEFT ACA profusion. IMPRESSION: CT HEAD: 1. No acute intracranial process. 2. Old RIGHT basal ganglia lacunar infarct, otherwise negative noncontrast CT HEAD for age. CTA NECK: 1. Atherosclerosis without hemodynamically significant stenosis. 2. Severe stenosis versus occluded LEFT vertebral artery origin with immediate reconstitution, diminutive LEFT vertebral artery. 3. **An incidental finding of potential clinical significance has been found. Multiple LEFT upper lobe pulmonary nodules including 10 mm spiculated nodule. Recommend contrast-enhanced CT chest on a nonemergent basis to assess for extent of involvement. ** CTA HEAD: 1. No emergent large vessel occlusion or severe stenosis anterior circulation. 2. Severe stenosis versus tandem occlusion diminutive LEFT vertebral artery. RIGHT vertebral artery is dominant. CT PERFUSION: 1. Small perfusion defect LEFT parietal lobe/posterior watershed territory suggesting brain at risk. Acute findings discussed with and reconfirmed by Dr.Lindzen, Neurology on 11/19/2017 at 6:35 pm. Aortic Atherosclerosis (ICD10-I70.0) and Emphysema (ICD10-J43.9). Electronically Signed   By: Awilda Metro M.D.   On: 11/19/2017 18:36   Ct Chest W Contrast  Result Date: 11/23/2017 CLINICAL DATA:  Pneumonia EXAM: CT CHEST WITH CONTRAST TECHNIQUE: Multidetector CT imaging of the chest was performed during intravenous contrast administration. CONTRAST:  75mL ISOVUE-300 IOPAMIDOL (ISOVUE-300) INJECTION 61% COMPARISON:  11/23/2017. Chest CT 09/22/2017, remote films dating back to 07/17/2015. FINDINGS: Cardiovascular: Prior CABG. Cardiomegaly. Moderate aortic calcifications. No aneurysm or adenopathy. Mediastinum/Nodes: No mediastinal, hilar, or axillary  adenopathy. Lungs/Pleura: Small left pleural effusion. Consolidation in the left lower lobe compatible with pneumonia. No central obstructing mass or endobronchial lesion. Clustered nodules in the right middle lobe, the largest 5 mm. Small nodules in the lingula on image 71, 5 mm or less. Numerous scattered similarly sized nodules in both lungs. Upper Abdomen: Imaging into the upper abdomen shows no acute findings. Musculoskeletal: Chest wall soft tissues are unremarkable. No acute bony abnormality. IMPRESSION: Consolidation in the left lower lobe compatible with pneumonia. Small left pleural effusion. Numerous small scattered pulmonary nodules, 5 mm or less in size. These are stable since prior studies dating back to 2016 compatible with benign nodules. Cardiomegaly.  Prior CABG. Aortic Atherosclerosis (ICD10-I70.0). Electronically Signed   By: Charlett Nose M.D.   On: 11/23/2017 17:28   Mr Brain Wo Contrast  Result Date: 11/20/2017 CLINICAL DATA:  Acute onset aphasia EXAM: MRI HEAD WITHOUT CONTRAST TECHNIQUE: Multiplanar, multiecho pulse sequences of the brain and surrounding structures were obtained without intravenous contrast. COMPARISON:  Head CT 11/19/2017 FINDINGS: Brain: The midline structures are normal. There is no acute infarct or acute hemorrhage. No mass lesion, hydrocephalus, dural abnormality or extra-axial collection. Old right corona radiata lacunar infarct. There is multifocal periventricular leukoaraiosis, most commonly seen in the setting of chronic ischemic microangiopathy. No age-advanced or lobar predominant atrophy. No chronic microhemorrhage or  superficial siderosis. Vascular: There is loss of the normal left vertebral artery flow void. The other major intracranial flow voids of the skullbase are preserved. Skull and upper cervical spine: The visualized skull base, calvarium, upper cervical spine and extracranial soft tissues are normal. Sinuses/Orbits: Small right mastoid effusion.  Paranasal sinuses are clear. Normal orbits. IMPRESSION: 1. No acute intracranial abnormality. 2. Chronic microvascular ischemia and old right corona radiata lacunar infarct. 3. Loss of the normal left vertebral artery flow void, consistent with the severely diminutive left vertebral artery demonstrated on the earlier CTA. Electronically Signed   By: Deatra Robinson M.D.   On: 11/20/2017 04:16   Mr Laqueta Jean ZO Contrast  Result Date: 11/20/2017 CLINICAL DATA:  Initial evaluation for follow-up stroke, status post tPA. EXAM: MRI HEAD WITHOUT AND WITH CONTRAST TECHNIQUE: Multiplanar, multiecho pulse sequences of the brain and surrounding structures were obtained without and with intravenous contrast. CONTRAST:  10mL MULTIHANCE GADOBENATE DIMEGLUMINE 529 MG/ML IV SOLN COMPARISON:  Prior CT from 11/19/2017 as well as previous MRI from earlier the same day. FINDINGS: Brain: Generalized age related cerebral atrophy. Patchy confluent T2/FLAIR hyperintensity within the periventricular white matter, most consistent with chronic small vascular disease. Remote lacunar infarct again noted within the anterior right corona radiata. No abnormal foci of restricted diffusion to suggest acute or subacute ischemia. Gray-white matter differentiation maintained. No evidence for acute intracranial hemorrhage status post tPA administration. No mass lesion, midline shift or mass effect. Ventricles stable in size without hydrocephalus. No extra-axial fluid collection. Major dural sinuses are grossly patent. No abnormal enhancement. Apparent irregular enhancement within the right cerebellar hemisphere on axial post-contrast imaging felt to be most consistent with artifact, and is not seen on corresponding coronal postcontrast sequence. No imaging findings to suggest acute CNS infection. Pituitary and suprasellar region grossly normal. Vascular: Hypoplastic left vertebral artery again noted. Major intravascular flow voids otherwise maintained.  Skull and upper cervical spine: Craniocervical junction grossly normal. Bone marrow signal intensity within normal limits. No appreciable scalp soft tissue abnormality. Sinuses/Orbits: Globes and oval soft tissues within normal limits. Patient status post lens extraction bilaterally. Scattered mucosal thickening noted within the ethmoidal air cells and maxillary sinuses. Paranasal sinuses are otherwise clear. No air-fluid level to suggest acute sinusitis. Right mastoid effusion noted, stable. Inner ear structures normal. Other: None. IMPRESSION: 1. No acute intracranial abnormality identified. No evidence for acute infarct or hemorrhage status post tPA administration. No imaging findings to suggest acute CNS infection. 2. Stable atrophy with chronic small vessel ischemic disease with old right corona radiata lacunar infarct. 3. Right mastoid effusion. Electronically Signed   By: Rise Mu M.D.   On: 11/20/2017 18:36   Mr Cervical Spine W Wo Contrast  Result Date: 11/22/2017 CLINICAL DATA:  LP concerning for viral meningitis. Patient nonverbal, and not following commands. RIGHT-sided weakness. EXAM: MRI CERVICAL SPINE WITHOUT AND WITH CONTRAST TECHNIQUE: Multiplanar and multiecho pulse sequences of the cervical spine, to include the craniocervical junction and cervicothoracic junction, were obtained without and with intravenous contrast. CONTRAST:  10mL MULTIHANCE GADOBENATE DIMEGLUMINE 529 MG/ML IV SOLN COMPARISON:  MRI brain 11/20/2017. FINDINGS: Alignment: Physiologic. Vertebrae: No fracture, evidence of discitis, or bone lesion. Cord: Normal signal and morphology. Posterior Fossa, vertebral arteries, paraspinal tissues: Negative. Disc levels: C2-3:  Normal. C3-4:  Normal.  Facet arthropathy. C4-5:  Normal.  Facet arthropathy. C5-6: Shallow central protrusion. Mild stenosis. Minimal cord flattening. No definite foraminal narrowing. C6-7: Disc space narrowing. Mild stenosis. Minimal cord flattening.  RIGHT-sided foraminal  narrowing due to uncinate spurring. C7-T1:  Unremarkable. Post infusion imaging does not demonstrate enhancement of the vertebral bodies, or intraspinal contents. No paravertebral focus of infection is evident. IMPRESSION: No concerning features of meningeal enhancement or cord hyperintensity. Ordinary spondylosis, without features of discitis. See discussion above. Electronically Signed   By: Elsie StainJohn T Curnes M.D.   On: 11/22/2017 19:06   Ct Cerebral Perfusion W Contrast  Result Date: 11/19/2017 CLINICAL DATA:  Acute onset dizziness and aphasia at 1400 hours. RIGHT-sided weakness. Assess stroke. EXAM: CT ANGIOGRAPHY HEAD AND NECK CT PERFUSION BRAIN TECHNIQUE: Multidetector CT imaging of the head and neck was performed using the standard protocol during bolus administration of intravenous contrast. Multiplanar CT image reconstructions and MIPs were obtained to evaluate the vascular anatomy. Carotid stenosis measurements (when applicable) are obtained utilizing NASCET criteria, using the distal internal carotid diameter as the denominator. Multiphase CT imaging of the brain was performed following IV bolus contrast injection. Subsequent parametric perfusion maps were calculated using RAPID software. CONTRAST:  100mL ISOVUE-370 IOPAMIDOL (ISOVUE-370) INJECTION 76% COMPARISON:  CT HEAD November 19, 2017 at 1627 hours FINDINGS: CT HEAD FINDINGS BRAIN: No intraparenchymal hemorrhage, mass effect nor midline shift. The ventricles and sulci are normal for age. Patchy supratentorial white matter hypodensities within normal range for patient's age, though non-specific are most compatible with chronic small vessel ischemic disease. Punctate LEFT frontal lobe calcification. Old RIGHT basal ganglia lacunar infarct. No acute large vascular territory infarcts. No abnormal extra-axial fluid collections. Basal cisterns are patent. VASCULAR: Moderate calcific atherosclerosis of the carotid siphons in RIGHT  vertebral artery. SKULL: No skull fracture. No significant scalp soft tissue swelling. SINUSES/ORBITS: Mild lobulated paranasal sinus mucosal thickening without air-fluid levels. Mastoid air cells are well aerated.The included ocular globes and orbital contents are non-suspicious. Status post bilateral ocular lens implants. OTHER: None. CTA NECK AORTIC ARCH: Normal appearance of the thoracic arch, normal branch pattern. Moderate calcific atherosclerosis aortic arch. The origins of the innominate, left Common carotid artery and subclavian artery are widely patent. RIGHT CAROTID SYSTEM: Common carotid artery is widely patent, mild calcific atherosclerosis. Moderate calcific atherosclerosis carotid bifurcation without hemodynamically significant stenosis by NASCET criteria. Normal appearance of the internal carotid artery. LEFT CAROTID SYSTEM: Common carotid artery is widely patent, mild calcific atherosclerosis. Moderate calcific atherosclerosis without hemodynamically significant stenosis by NASCET criteria. Focal luminal irregularity LEFT internal carotid artery origin, favoring sequelae of atherosclerosis, less likely pseudo aneurysm. VERTEBRAL ARTERIES:RIGHT vertebral artery is dominant. Calcific atherosclerosis resulting in severe stenosis RIGHT vertebral artery. The severe stenosis versus occluded LEFT P1 origin with immediate reconstitution, at diminutive thready vessel. SKELETON: No acute osseous process though bone windows have not been submitted. Patient is edentulous. Multilevel moderate to severe RIGHT cervical facet arthropathy. OTHER NECK: Soft tissues of the neck are nonacute though, not tailored for evaluation. UPPER CHEST: Tiny scattered centrilobular ground-glass nodules most compatible with respiratory bronchiolitis. Centrilobular emphysema. Multiple LEFT upper lobe pulmonary nodules including 10 mm spiculated nodule (series 15, image [id number]). LEFT upper lobe calcified granuloma. No superior  mediastinal lymphadenopathy. OTHER: None. CTA HEAD ANTERIOR CIRCULATION: Patent cervical internal carotid arteries, petrous, cavernous and supra clinoid internal carotid arteries. Atherosclerosis resulting in mild stenosis RIGHT supraclinoid internal carotid artery. Patent anterior communicating artery. Patent anterior and middle cerebral arteries. No large vessel occlusion, significant stenosis, contrast extravasation or aneurysm. POSTERIOR CIRCULATION: Patent RIGHT vertebral artery, vertebrobasilar junction and basilar artery, as well as main branch vessels. Extremely diminutive LEFT vertebral artery, with tandem severe stenosis versus occlusion and  reconstitution. Bilateral posterior inferior cerebellar arteries are patent. Small LEFT P1 segment, robust LEFT posterior communicating artery. Patent posterior cerebral arteries. No large vessel occlusion, significant stenosis, contrast extravasation or aneurysm. VENOUS SINUSES: Major dural venous sinuses are patent though not tailored for evaluation on this angiographic examination. ANATOMIC VARIANTS: None. DELAYED PHASE: Not performed. MIP images reviewed. CT Brain Perfusion Findings: CBF (<30%) Volume: 0mL Perfusion (Tmax>6.0s) volume: 9mL Mismatch Volume: 9mL Infarction Location:LEFT posterior parietal lobe. Generalized prolonged T-max LEFT MCA and posterior watershed territory with preserved flow, potentially from ICA stenosis though, preserved LEFT ACA profusion. IMPRESSION: CT HEAD: 1. No acute intracranial process. 2. Old RIGHT basal ganglia lacunar infarct, otherwise negative noncontrast CT HEAD for age. CTA NECK: 1. Atherosclerosis without hemodynamically significant stenosis. 2. Severe stenosis versus occluded LEFT vertebral artery origin with immediate reconstitution, diminutive LEFT vertebral artery. 3. **An incidental finding of potential clinical significance has been found. Multiple LEFT upper lobe pulmonary nodules including 10 mm spiculated nodule.  Recommend contrast-enhanced CT chest on a nonemergent basis to assess for extent of involvement. ** CTA HEAD: 1. No emergent large vessel occlusion or severe stenosis anterior circulation. 2. Severe stenosis versus tandem occlusion diminutive LEFT vertebral artery. RIGHT vertebral artery is dominant. CT PERFUSION: 1. Small perfusion defect LEFT parietal lobe/posterior watershed territory suggesting brain at risk. Acute findings discussed with and reconfirmed by Dr.Lindzen, Neurology on 11/19/2017 at 6:35 pm. Aortic Atherosclerosis (ICD10-I70.0) and Emphysema (ICD10-J43.9). Electronically Signed   By: Awilda Metro M.D.   On: 11/19/2017 18:36   Dg Chest Port 1 View  Result Date: 11/21/2017 CLINICAL DATA:  Coughing. EXAM: PORTABLE CHEST 1 VIEW COMPARISON:  11/20/2017 FINDINGS: Stable changes from prior cardiac surgery. Cardiac silhouette is mildly enlarged. No mediastinal or hilar masses. Opacities noted at the medial left lung base similar to the prior exam. This may reflect atelectasis or pneumonia. Mild chronic scarring in the left upper lobe. Remainder of the lungs is clear. No pleural effusion or pneumothorax. IMPRESSION: 1. No significant change from the previous day's study. 2. Left medial lung base opacity is again noted consistent with pneumonia or atelectasis. Electronically Signed   By: Amie Portland M.D.   On: 11/21/2017 09:30   Dg Chest Port 1 View  Result Date: 11/20/2017 CLINICAL DATA:  Fever. EXAM: PORTABLE CHEST 1 VIEW COMPARISON:  CT scan of the chest dated 09/22/2017 and chest x-ray dated 03/06/2015 FINDINGS: There is a small area of increased density at the left lung base medially, new since the prior exam. There was an area of slight atelectasis at that site on the prior CT scan. Calcified granulomas in the left lung apex, unchanged. The lungs are otherwise clear. Heart size and pulmonary vascularity are normal. CABG. No acute bone abnormality. IMPRESSION: Small area of infiltrate or  increased atelectasis in the left lung base, new since the prior CT scan. Electronically Signed   By: Francene Boyers M.D.   On: 11/20/2017 15:17   Ct Head Code Stroke Wo Contrast  Result Date: 11/19/2017 CLINICAL DATA:  Code stroke.  Sudden onset dizziness and aphasia. EXAM: CT HEAD WITHOUT CONTRAST TECHNIQUE: Contiguous axial images were obtained from the base of the skull through the vertex without intravenous contrast. COMPARISON:  None FINDINGS: Brain: A 2 mm punctate density in the left frontal white matter is favored to reflect a calcification. No definite intracranial hemorrhage is identified. There is a chronic lacunar infarct in the right lentiform nucleus. No acute cortically based infarct, mass, midline shift, or extra-axial fluid  collection is identified. Periventricular white matter hypodensities are nonspecific but compatible with mild chronic small vessel ischemic disease. Generalized cerebral atrophy is relatively mild for age. Vascular: Calcified atherosclerosis at the skullbase. No hyperdense vessel. Skull: No fracture or focal osseous lesion. Sinuses/Orbits: Mild bilateral ethmoid air cell mucosal thickening. Clear mastoid air cells. No acute findings in the included orbits. Other: None. ASPECTS [facility] Stroke Program Early CT Score) - Ganglionic level infarction (caudate, lentiform nuclei, internal capsule, insula, M1-M3 cortex): 7 - Supraganglionic infarction (M4-M6 cortex): 3 Total score (0-10 with 10 being normal): 10 IMPRESSION: 1. No evidence of acute intracranial abnormality. 2. ASPECTS is 10. 3. Mild chronic small vessel ischemic disease and cerebral atrophy. Chronic right basal ganglia lacunar infarct. These results were called by telephone at the time of interpretation on 11/19/2017 at 4:40 pm to Dr. Sharman Cheek , who verbally acknowledged these results. Electronically Signed   By: Sebastian Ache M.D.   On: 11/19/2017 16:40    Lab Data:  CBC: Recent Labs  Lab  11/19/17 1620 11/20/17 0728 11/21/17 0540 11/22/17 0514 11/23/17 0354 11/24/17 0307  WBC 6.5 8.3 8.3 8.0 8.1 7.5  NEUTROABS 4.8  --   --   --   --  5.6  HGB 11.7* 11.6* 11.5* 10.9* 10.8* 10.1*  HCT 35.6* 34.9* 34.3* 32.6* 32.0* 29.8*  MCV 94.3 92.1 91.5 91.8 92.0 92.0  PLT 240 232 212 212 203 180   Basic Metabolic Panel: Recent Labs  Lab 11/20/17 0728 11/21/17 0540 11/22/17 0514 11/23/17 0354 11/24/17 0307  NA 138 137 138 140 144  K 4.3 3.9 3.9 3.4* 3.3*  CL 104 103 107 109 114*  CO2 21* 23 22 24 24   GLUCOSE 90 104* 97 195* 158*  BUN 11 13 15 18 17   CREATININE 1.12 1.23 1.17 1.06 0.90  CALCIUM 9.2 8.8* 8.3* 8.2* 8.2*   GFR: Estimated Creatinine Clearance: 50.6 mL/min (by C-G formula based on SCr of 0.9 mg/dL). Liver Function Tests: Recent Labs  Lab 11/19/17 1620  AST 24  ALT 17  ALKPHOS 65  BILITOT 0.7  PROT 6.7  ALBUMIN 4.2   No results for input(s): LIPASE, AMYLASE in the last 168 hours. Recent Labs  Lab 11/20/17 1113  AMMONIA 13   Coagulation Profile: Recent Labs  Lab 11/19/17 1620  INR 1.04   Cardiac Enzymes: Recent Labs  Lab 11/19/17 1620 11/22/17 0514  TROPONINI 0.05* 0.12*   BNP (last 3 results) No results for input(s): PROBNP in the last 8760 hours. HbA1C: No results for input(s): HGBA1C in the last 72 hours. CBG: Recent Labs  Lab 11/23/17 1603 11/23/17 2035 11/23/17 2309 11/24/17 0305 11/24/17 0748  GLUCAP 203* 162* 72 154* 230*   Lipid Profile: No results for input(s): CHOL, HDL, LDLCALC, TRIG, CHOLHDL, LDLDIRECT in the last 72 hours. Thyroid Function Tests: No results for input(s): TSH, T4TOTAL, FREET4, T3FREE, THYROIDAB in the last 72 hours. Anemia Panel: No results for input(s): VITAMINB12, FOLATE, FERRITIN, TIBC, IRON, RETICCTPCT in the last 72 hours. Urine analysis:    Component Value Date/Time   COLORURINE YELLOW 11/23/2017 1435   APPEARANCEUR CLEAR 11/23/2017 1435   LABSPEC 1.025 11/23/2017 1435   PHURINE 5.0  11/23/2017 1435   GLUCOSEU 50 (A) 11/23/2017 1435   HGBUR NEGATIVE 11/23/2017 1435   BILIRUBINUR NEGATIVE 11/23/2017 1435   KETONESUR 5 (A) 11/23/2017 1435   PROTEINUR 100 (A) 11/23/2017 1435   NITRITE NEGATIVE 11/23/2017 1435   LEUKOCYTESUR NEGATIVE 11/23/2017 1435     Hosie Sharman  Breandan People M.D. [facility] 11/24/2017, 11:06 AM  Pager: [phone] Between 7am to 7pm - call Pager - [phone]  After 7pm go to [email or web address] - password [email or web address]  Call night coverage person covering after 7pm

## 2017-11-24 NOTE — Consult Note (Signed)
Consultation Note Date: 11/24/2017   Patient Name: Peter Becker  DOB: 1929/08/31  MRN: [id number]  Age / Sex: 83 y.o., male  PCP: Danella PentonMiller, Mark F, MD Referring Physician: Marvel PlanXu, Jindong, MD  Reason for Consultation: Establishing goals of care and Psychosocial/spiritual support  HPI/Patient Profile: 83 y.o. male  admitted on 11/19/2017 to [facility] with a past medical history of diabetes, hypertension, peripheral arterial disease, coronary artery disease who presented to [facility] with sudden onset aphasia, brought in by wife.  While in the emergency department, he became aphasic again -unable to comprehend and say words. No significant weaknesses was noted in any extremity. Patient received TPA. CTA  did not show a large vessel occlusion, CT perfusion however increased MTT in the left parietal lobe.  Patient apparently improved again after receiving tPA however symptoms became worse again shortly after. [facility].   Family face advanced directive decisions, and anticipatory care needs.   Clinical Assessment and Goals of Care:  This NP Lorinda CreedMary Chezney Huether reviewed medical records, received report from team, assessed the patient and then meet at the patient's bedside along with his wife  to discuss diagnosis, prognosis, GOC, EOL wishes disposition and options.  A detailed discussion was had today regarding advanced directives.  Concepts specific to code status, artifical feeding and hydration, continued IV antibiotics and rehospitalization was had.  The difference between a aggressive medical intervention path  and a palliative comfort care path for this patient at this time was had.  Values and goals of care important to patient and family were attempted to be elicited.  Concept of Palliative Care was discussed  Natural trajectory and expectations at EOL were  discussed.  Questions and concerns addressed.   Family encouraged to call with questions or concerns.  PMT will continue to support holistically.    HCPOA/ wife- to bring in copy of AD today     SUMMARY OF RECOMMENDATIONS    Code Status/Advance Care Planning:  DNR-documented today, wife to bring AD to be scanned.   Patient has communicated in the past his desire for DNR  Palliative Prophylaxis:   Aspiration, Bowel Regimen, Delirium Protocol, Frequent Pain Assessment and Oral Care  Additional Recommendations (Limitations, Scope, Preferences):  Full Scope Treatment- treat the treatable, family is hopeful for improvement   Psycho-social/Spiritual:   Desire for further Chaplaincy support:no- strong community church support Wife is realistic and understands her husband is elderly and has multiple co-morbidItes, however is hopeful for improvement and "putting it in Gods hands"  Prognosis:   Unable to determine -will be impacted by decision for life prolonging intervetnions  Discharge Planning: To Be Determined      Primary Diagnoses: Present on Admission: . Ischemic stroke (HCC)   I have reviewed the medical record, interviewed the patient and family, and examined the patient. The following aspects are pertinent.  Past Medical History:  Diagnosis Date  . Diabetes mellitus without complication (HCC)   . Hypertension   . Myocardial infarct (HCC)  Social History   Socioeconomic History  . Marital status: Married    Spouse name: Not on file  . Number of children: Not on file  . Years of education: Not on file  . Highest education level: Not on file  Social Needs  . Financial resource strain: Not on file  . Food insecurity - worry: Not on file  . Food insecurity - inability: Not on file  . Transportation needs - medical: Not on file  . Transportation needs - non-medical: Not on file  Occupational History  . Not on file  Tobacco Use  . Smoking status: Former  Games developer  . Smokeless tobacco: Never Used  Substance and Sexual Activity  . Alcohol use: No  . Drug use: No  . Sexual activity: Not on file  Other Topics Concern  . Not on file  Social History Narrative  . Not on file   Family History  Problem Relation Age of Onset  . Heart attack Father    Scheduled Meds: . chlorhexidine  15 mL Mouth Rinse BID  . insulin aspart  0-9 Units Subcutaneous Q4H  . mouth rinse  15 mL Mouth Rinse q12n4p  . pantoprazole (PROTONIX) IV  40 mg Intravenous QHS   Continuous Infusions: . sodium chloride 75 mL/hr at 11/24/17 0700  . ceFEPime (MAXIPIME) IV Stopped (11/23/17 1448)  . feeding supplement (JEVITY 1.2 CAL) 1,000 mL (11/24/17 0700)  . valproic acid (DEPACON) IVPB 500 mg (11/24/17 0631)  . vancomycin Stopped (11/24/17 0316)   PRN Meds:.acetaminophen, labetalol Medications Prior to Admission:  Prior to Admission medications   Medication Sig Start Date End Date Taking? Authorizing Provider  aspirin 81 MG chewable tablet Chew 81 mg by mouth daily.   Yes [provider]  Calcium Carbonate-Vit D-Min (CALCIUM 600+D3 PLUS MINERALS PO) Take 1 tablet by mouth daily.   Yes [provider]  chlorpheniramine (CHLOR-TRIMETON) 4 MG tablet Take 4 mg by mouth every 6 (six) hours as needed for allergies.    Yes [provider]  Cholecalciferol (VITAMIN D3) 2000 units capsule Take 2,000 Units by mouth daily.    Yes [provider]  cyanocobalamin (,VITAMIN B-12,) 1000 MCG/ML injection INJECT 1 ML IN THE MUSCLE EVERY 14 DAYS 09/02/16  Yes [provider]  fluticasone (FLONASE) 50 MCG/ACT nasal spray Place 2 sprays into both nostrils daily.    Yes [provider]  glyBURIDE-metformin (GLUCOVANCE) 2.5-500 MG tablet Take 2 tablets by mouth 2 (two) times daily with a meal.  11/18/16  Yes [provider]  losartan (COZAAR) 50 MG tablet Take 1 tablet (50mg ) by mouth every day 01/19/16  Yes [provider]    lovastatin (MEVACOR) 20 MG tablet Take 20 mg by mouth every evening.  12/20/16  Yes [provider]  meloxicam (MOBIC) 7.5 MG tablet Take 15 mg by mouth daily as needed (for hip pain).  04/07/17  Yes [provider]  metoprolol tartrate (LOPRESSOR) 25 MG tablet Take 12.5 mg by mouth 2 (two) times daily.  09/07/15  Yes [provider]  Multiple Vitamin (MULTI-VITAMINS) TABS Take 1 tablet by mouth daily.    Yes [provider]  Multiple Vitamins-Minerals (OCUVITE EXTRA PO) Take 1 tablet by mouth 2 (two) times daily.    Yes [provider]  Omega-3 Fatty Acids (FISH OIL PO) Take 1 capsule by mouth daily.    Yes [provider]  traMADol (ULTRAM) 50 MG tablet Take 1 tablet (50 mg total) by mouth every  6 (six) hours as needed for moderate pain. 01/25/17  Yes Bridget HartshornSummers, Rhonda L, PA-C  vitamin E 400 UNIT capsule Take 400 Units by mouth daily.    Yes [provider]   Allergies  Allergen Reactions  . Tape Other (See Comments)    SKIN IS VERY THIN AND TEARS AND BRUISES EASILY; Please use an alternative!!   Review of Systems  Unable to perform ROS: Mental status change    Physical Exam  Constitutional: He appears well-developed.  HENT:  Audible throat secretions  Cardiovascular: Normal rate, regular rhythm and normal heart sounds.  Pulmonary/Chest: He has decreased breath sounds in the right lower field and the left lower field. He has rhonchi.  Neurological:  Eyes are open and patient tracks Attempting to verbalize but verbiage is garbled  Skin: Skin is warm and dry.    Vital Signs: BP (!) 164/82   Pulse 86   Temp 98.4 F (36.9 C) (Axillary)   Resp (!) 21   Ht 5\' 7"  (1.702 m)   Wt 60.7 kg (133 lb 13.1 oz)   SpO2 95%   BMI 20.96 kg/m  Pain Assessment: CPOT       SpO2: SpO2: 95 % O2 Device:SpO2: 95 % O2 Flow Rate: .O2 Flow Rate (L/min): 1 L/min  IO: Intake/output summary:   Intake/Output Summary (Last 24 hours) at  11/24/2017 0806 Last data filed at 11/24/2017 0700 Gross per 24 hour  Intake 3670 ml  Output 650 ml  Net 3020 ml    LBM: Last BM Date: 11/22/17 Baseline Weight: Weight: 56.2 kg (124 lb) Most recent weight: Weight: 60.7 kg (133 lb 13.1 oz)     Palliative Assessment/Data: 20 %   Discussed with Dr Isidoro Donningai  PMT will continue to support holistically  Time In: 0700 Time Out: 0815 Time Total: 75 min Greater than 50%  of this time was spent counseling and coordinating care related to the above assessment and plan.  Signed by: Lorinda CreedMary Ahnesti Townsend, NP   Please contact Palliative Medicine Team phone at [phone] for questions and concerns.  For individual provider: See Loretha StaplerAmion

## 2017-11-25 DIAGNOSIS — Z66 Do not resuscitate: Secondary | ICD-10-CM

## 2017-11-25 DIAGNOSIS — R1310 Dysphagia, unspecified: Secondary | ICD-10-CM

## 2017-11-25 DIAGNOSIS — Z515 Encounter for palliative care: Secondary | ICD-10-CM

## 2017-11-25 DIAGNOSIS — L8990 Pressure ulcer of unspecified site, unspecified stage: Secondary | ICD-10-CM

## 2017-11-25 DIAGNOSIS — R404 Transient alteration of awareness: Secondary | ICD-10-CM

## 2017-11-25 LAB — ANAEROBIC CULTURE

## 2017-11-25 LAB — CULTURE, BLOOD (ROUTINE X 2)
CULTURE: NO GROWTH
Culture: NO GROWTH
SPECIAL REQUESTS: ADEQUATE
Special Requests: ADEQUATE

## 2017-11-25 LAB — GLUCOSE, CAPILLARY
GLUCOSE-CAPILLARY: 191 mg/dL — AB (ref 65–99)
GLUCOSE-CAPILLARY: 189 mg/dL — AB (ref 65–99)
GLUCOSE-CAPILLARY: 144 mg/dL — AB (ref 65–99)
Glucose-Capillary: 170 mg/dL — ABNORMAL HIGH (ref 65–99)
Glucose-Capillary: 188 mg/dL — ABNORMAL HIGH (ref 65–99)
Glucose-Capillary: 199 mg/dL — ABNORMAL HIGH (ref 65–99)

## 2017-11-25 LAB — VALPROIC ACID LEVEL: Valproic Acid Lvl: 64 ug/mL (ref 50.0–100.0)

## 2017-11-25 MED ORDER — CALCIUM CARBONATE-VITAMIN D 500-200 MG-UNIT PO TABS
1.0000 | ORAL_TABLET | Freq: Every day | ORAL | Status: DC
  Administered 2017-11-26: 1
  Filled 2017-11-25 (×2): qty 1

## 2017-11-25 MED ORDER — VITAMIN D3 25 MCG (1000 UNIT) PO TABS
2000.0000 [IU] | ORAL_TABLET | Freq: Every day | ORAL | Status: DC
  Administered 2017-11-25 – 2017-11-26 (×2): 2000 [IU]
  Filled 2017-11-25 (×4): qty 2

## 2017-11-25 MED ORDER — MELOXICAM 7.5 MG PO TABS: 15.0000 mg | ORAL_TABLET | Freq: Every day | ORAL | Status: DC | PRN

## 2017-11-25 MED ORDER — ADULT MULTIVITAMIN W/MINERALS CH
1.0000 | ORAL_TABLET | Freq: Every day | ORAL | Status: DC
  Administered 2017-11-25 – 2017-11-26 (×2): 1
  Filled 2017-11-25 (×2): qty 1

## 2017-11-25 MED ORDER — OMEGA-3-ACID ETHYL ESTERS 1 G PO CAPS
1.0000 g | ORAL_CAPSULE | Freq: Every day | ORAL | Status: DC
  Administered 2017-11-26: 1 g
  Filled 2017-11-25 (×2): qty 1

## 2017-11-25 MED ORDER — PRAVASTATIN SODIUM 20 MG PO TABS
10.0000 mg | ORAL_TABLET | Freq: Every day | ORAL | Status: DC
  Administered 2017-11-25: 10 mg
  Filled 2017-11-25: qty 1

## 2017-11-25 MED ORDER — LOSARTAN POTASSIUM 25 MG PO TABS
25.0000 mg | ORAL_TABLET | Freq: Every day | ORAL | Status: DC
  Administered 2017-11-25 – 2017-11-26 (×2): 25 mg
  Filled 2017-11-25 (×2): qty 1

## 2017-11-25 MED ORDER — GLYBURIDE 5 MG PO TABS
5.0000 mg | ORAL_TABLET | Freq: Two times a day (BID) | ORAL | Status: DC
  Administered 2017-11-25 – 2017-11-26 (×2): 5 mg
  Filled 2017-11-25 (×3): qty 1

## 2017-11-25 MED ORDER — ASPIRIN 81 MG PO CHEW
81.0000 mg | CHEWABLE_TABLET | Freq: Every day | ORAL | Status: DC
  Administered 2017-11-25 – 2017-11-26 (×2): 81 mg
  Filled 2017-11-25 (×2): qty 1

## 2017-11-25 MED ORDER — METFORMIN HCL 500 MG PO TABS
1000.0000 mg | ORAL_TABLET | Freq: Two times a day (BID) | ORAL | Status: DC
  Administered 2017-11-25 – 2017-11-26 (×2): 1000 mg
  Filled 2017-11-25 (×2): qty 2

## 2017-11-25 MED ORDER — METOPROLOL TARTRATE 12.5 MG HALF TABLET
12.5000 mg | ORAL_TABLET | Freq: Two times a day (BID) | ORAL | Status: DC
  Administered 2017-11-25 – 2017-11-26 (×3): 12.5 mg
  Filled 2017-11-25 (×2): qty 1

## 2017-11-25 MED ORDER — TRAMADOL HCL 50 MG PO TABS: 50.0000 mg | ORAL_TABLET | Freq: Four times a day (QID) | ORAL | Status: DC | PRN

## 2017-11-25 NOTE — Plan of Care (Signed)
  Education: Knowledge of General Education information will improve 11/25/2017 0236 - Not Progressing by Olena Materobinson, Quan Cybulski G, RN Note POC reviewed with pt. and unsure how much pt. understands.

## 2017-11-25 NOTE — Progress Notes (Signed)
STROKE TEAM PROGRESS NOTE   SUBJECTIVE (INTERVAL HISTORY) His wife and son at the bedside. Pt still nonverbal but following   all commands. No events overnight.   He has been started on antibiotics and appears to be improving. OBJECTIVE Temp:  [98.3 F (36.8 C)-98.8 F (37.1 C)] 98.7 F (37.1 C) (12/25 0518) Pulse Rate:  [73-86] 73 (12/25 0518) Cardiac Rhythm: Normal sinus rhythm (12/25 0700) Resp:  [15-21] 20 (12/25 0518) BP: (145-165)/(60-92) 147/60 (12/25 0518) SpO2:  [94 %-99 %] 94 % (12/25 0518) Weight:  [139 lb 1.8 oz (63.1 kg)] 139 lb 1.8 oz (63.1 kg) (12/25 0518)  Recent Labs  Lab 11/24/17 2055 11/25/17 0004 11/25/17 0510 11/25/17 0815 11/25/17 1146  GLUCAP 189* 170* 191* 189* 188*   Recent Labs  Lab 11/20/17 0728 11/21/17 0540 11/22/17 0514 11/23/17 0354 11/24/17 0307  NA 138 137 138 140 144  K 4.3 3.9 3.9 3.4* 3.3*  CL 104 103 107 109 114*  CO2 21* 23 22 24 24   GLUCOSE 90 104* 97 195* 158*  BUN 11 13 15 18 17   CREATININE 1.12 1.23 1.17 1.06 0.90  CALCIUM 9.2 8.8* 8.3* 8.2* 8.2*   Recent Labs  Lab 11/19/17 1620  AST 24  ALT 17  ALKPHOS 65  BILITOT 0.7  PROT 6.7  ALBUMIN 4.2   Recent Labs  Lab 11/19/17 1620 11/20/17 0728 11/21/17 0540 11/22/17 0514 11/23/17 0354 11/24/17 0307  WBC 6.5 8.3 8.3 8.0 8.1 7.5  NEUTROABS 4.8  --   --   --   --  5.6  HGB 11.7* 11.6* 11.5* 10.9* 10.8* 10.1*  HCT 35.6* 34.9* 34.3* 32.6* 32.0* 29.8*  MCV 94.3 92.1 91.5 91.8 92.0 92.0  PLT 240 232 212 212 203 180   Recent Labs  Lab 11/19/17 1620 11/22/17 0514  TROPONINI 0.05* 0.12*   No results for input(s): LABPROT, INR in the last 72 hours. Recent Labs    11/23/17 1435  COLORURINE YELLOW  LABSPEC 1.025  PHURINE 5.0  GLUCOSEU 50*  HGBUR NEGATIVE  BILIRUBINUR NEGATIVE  KETONESUR 5*  PROTEINUR 100*  NITRITE NEGATIVE  LEUKOCYTESUR NEGATIVE       Component Value Date/Time   CHOL 129 11/20/2017 0559   TRIG 56 11/20/2017 0559   HDL 71 11/20/2017 0559    CHOLHDL 1.8 11/20/2017 0559   VLDL 11 11/20/2017 0559   LDLCALC 47 11/20/2017 0559   Lab Results  Component Value Date   HGBA1C 7.3 (H) 11/20/2017      Component Value Date/Time   LABOPIA NONE DETECTED 11/20/2017 1055   COCAINSCRNUR NONE DETECTED 11/20/2017 1055   LABBENZ NONE DETECTED 11/20/2017 1055   AMPHETMU NONE DETECTED 11/20/2017 1055   THCU NONE DETECTED 11/20/2017 1055   LABBARB NONE DETECTED 11/20/2017 1055    No results for input(s): ETH in the last 168 hours.   IMAGING  I have personally reviewed the radiological images below and agree with the radiology interpretations.   MR Cervical Spine With and Without Contrast - pending   Ct Angio Head and neck and CTP W Or Wo Contrast 11/19/2017 IMPRESSION:  CT HEAD:  1. No acute intracranial process.  2. Old RIGHT basal ganglia lacunar infarct, otherwise negative noncontrast CT HEAD for age.   CTA NECK:  1. Atherosclerosis without hemodynamically significant stenosis.  2. Severe stenosis versus occluded LEFT vertebral artery origin with immediate reconstitution, diminutive LEFT vertebral artery.  3. **An incidental finding of potential clinical significance has been found.  Multiple LEFT upper  lobe pulmonary nodules including 10 mm spiculated nodule. Recommend contrast-enhanced CT chest on a nonemergent basis to assess for extent of involvement. **    CTA HEAD:  1. No emergent large vessel occlusion or severe stenosis anterior circulation.  2. Severe stenosis versus tandem occlusion diminutive LEFT vertebral artery.   RIGHT vertebral artery is dominant.    CT PERFUSION:  1. Small perfusion defect LEFT parietal lobe/posterior watershed territory suggesting brain at risk.  Aortic Atherosclerosis (ICD10-I70.0) and Emphysema (ICD10-J43.9).   Mr Brain Wo Contrast 11/20/2017 IMPRESSION:  1. No acute intracranial abnormality.  2. Chronic microvascular ischemia and old right corona radiata lacunar infarct.  3.  Loss of the normal left vertebral artery flow void, consistent with the severely diminutive left vertebral artery demonstrated on the earlier CTA.     Ct Head Code Stroke Wo Contrast 11/19/2017 IMPRESSION:  1. No evidence of acute intracranial abnormality.  2. ASPECTS is 10.  3. Mild chronic small vessel ischemic disease and cerebral atrophy.   Chronic right basal ganglia lacunar infarct.    EEG: 11/20/2017 This EEG is abnormal due to diffuse slowing of the waking background. Clinical Correlation of the above findings indicates diffuse cerebral dysfunction that is non-specific in etiology and can be seen with hypoxic/ischemic injury, toxic/metabolic encephalopathies, neurodegenerative disorders, or medication effect.  However, findings may also be due to excessive drowsiness.  Clinical correlation advised.   EEG: 11/21/2017 Impression: This awake and asleep EEG is abnormal due to occasional focal slowing over the left hemisphere, maximal over the left frontocentrotemporal region.  Clinical Correlation of the above findings indicates focal cerebral dysfunction over the left hemisphere region suggestive of underlying structural or physiologic abnormality. The absence of epileptiform discharges does not exclude a clinical diagnosis of epilepsy. Clinical correlation is advised.   Mr Laqueta JeanBrain W Wo Contrast 11/20/2017 IMPRESSION:  1. No acute intracranial abnormality identified. No evidence for acute infarct or hemorrhage status post tPA administration. No imaging findings to suggest acute CNS infection.  2. Stable atrophy with chronic small vessel ischemic disease with old right corona radiata lacunar infarct.  3. Right mastoid effusion.    Dg Chest Port 1 View 11/20/2017 IMPRESSION:  Small area of infiltrate or increased atelectasis in the left lung base, new since the prior CT scan.    Dg Chest Port 1 View 11/21/2017 IMPRESSION: 1. No significant change from the previous day's  study. 2. Left medial lung base opacity is again noted consistent with pneumonia or atelectasis.    TTE  - Left ventricle: The cavity size was normal. Systolic function was   normal. The estimated ejection fraction was in the range of 50%   to 55%. Hypokinesis of the apical myocardium. Doppler parameters   are consistent with abnormal left ventricular relaxation (grade 1   diastolic dysfunction). Doppler parameters are consistent with   high ventricular filling pressure. - Aortic valve: Transvalvular velocity was within the normal range.   There was no stenosis. There was no regurgitation. Valve area   (VTI): 2.8 cm^2. Valve area (Vmax): 2.67 cm^2. Valve area   (Vmean): 2.38 cm^2. - Mitral valve: Transvalvular velocity was within the normal range.   There was no evidence for stenosis. There was mild regurgitation. - Left atrium: The atrium was moderately dilated. - Right ventricle: The cavity size was normal. Wall thickness was   normal. Systolic function was normal. - Right atrium: The atrium was mildly dilated. - Atrial septum: No defect or patent foramen ovale was identified. - Tricuspid  valve: There was no regurgitation.  MRI Cervical spine : No concerning features of meningeal enhancement or cord hyperintensity.Ordinary spondylosis, without features of discitis  PHYSICAL EXAM Vitals:   11/24/17 1800 11/24/17 1900 11/24/17 2037 11/25/17 0518  BP: (!) 164/74 (!) 149/92 (!) 165/75 (!) 147/60  Pulse: 79 81 86 73  Resp: 20 18 15 20   Temp:   98.8 F (37.1 C) 98.7 F (37.1 C)  TempSrc:   Oral Axillary  SpO2: 97% 97% 95% 94%  Weight:    139 lb 1.8 oz (63.1 kg)  Height:        General - Well nourished, well developed, still obtunded and not following commands.  Ophthalmologic - fundi not visualized due to noncooperation.  Cardiovascular - Regular rate and rhythm.  Neuro -awake alert and now consistently following commands.  . Nonverbal, but attempts to phonate with  incomprehensible speech not able to name or repeat,    . Inconsistently blinking to visual threat bilaterally. Eyes can follow movement bilaterally as commanded, doll's eye present. PREEL. Positive corneal and gag. LUE and LLE move spontaneously and against gravity, RUE 2/5 and RLE 3/5 on pain. DTR 1+ and not cooperative on babinski. Sensation, coordination and gait not tested.   ASSESSMENT/PLAN Mr. Deunta Beneke is a 83 y.o. male with history of DM, HTN, MI s/p CABG, PVD, and HLD on lovastatin admitted for AMS, confusion, aphasia, wax and waning process. TPA given at OSH.    AMS with intermittent fever - Seizure with Todd paralysis vs. CNS infection -meningoencephalitis presentation concerning for seizure like activity and CSF concerning for viral meningitis    Resultant AMS, nonverbal, agitation, left hemiparesis  MRI  No acute stroke  MRI brain repeat with and without contrast negative  CTA head and neck b/l ICA proximal and right VA origin athero, left VA hypoplastic  2D Echo EF 50-55%  EEG - diffuse slowing no seizure  TSH/B12 and ammonia WNL  LP showed elevated RBC, WBC and protein, concerning for viral meningitis. ID consulted  Blood culture - no growth day 1  Repeat EEG - see above  LDL 47  HgbA1c 7.3  SCDs for VTE prophylaxis  No diet orders on file   aspirin 81 mg daily prior to admission, now on No antithrombotic  Therapy recommendations:  pending  Disposition:  Pending  ? pneumonia   CXR showed new small area of infiltration left lung base  Repeat CXR - no significant change - possible pneumonia - not currently on antibiotics.  Has cough with secretion  Intermittent fever  Diabetes  HgbA1c 7.3 goal < 7.0  Uncontrolled  Home meds -glyburide  CBG monitoring  SSI  Hypertension Stable  Long term BP goal normotensive  Hyperlipidemia  Home meds:  lovastatin   LDL 47, goal < 70  Resume once po access  Continue statin at  discharge  Other Stroke Risk Factors  Advanced age  CAD/MI s/p CABG  PVD   Seizure Activity  Now on Depacon - 500 mg IV every 8 hours - level optimal at 71  EEGs 2 - abnormal as noted above   Other Active Problems  Tmax - 100.9 -> 98.2 Saturday  Multiple LEFT upper lobe pulmonary nodules including 10 mm spiculated nodule. Contrast-enhanced CT chest on a nonemergent basis to assess for extent of involvement recommended.  Possible pneumonia - consider antibiotics will call CCM/medical hospitalist team to consult and help manage  Tube feeds started, nutrition consulted  Hospital day # 6  The  patient is likely to gradually improve over the next few days but will need antibiotics for his pneumonia and consolidation. Continue tube feeds and speech therapy to follow to decide on PEG tube versus oral feeding. Long discussion with the patient's wife at the bedside and answered questions about his care. She would prefer full ongoing medical support and even transfer to skilled nursing facility or a PEG tube if necessary. Discussed with Dr. Isidoro Donningai and plan to transfer patient out of ICU today and transfer her to medical hospitalist service for further management. Stroke team will continue to follow. Greater than 50% time during this 35 minute visit was spent on counseling and coordination of care about his meningoencephalitis, pneumonia and answering questions.  Delia HeadyPramod Anjel Perfetti, MD Medical Director [facility] Pager: [phone] 11/25/2017 12:33 PM   To contact Stroke Continuity provider, please refer to [email or web address]. After hours, contact General Neurology STROKE TEAM PROGRESS NOTE   SUBJECTIVE (INTERVAL HISTORY) His family is not at the bedside. Pt still nonverbal butt following very few commands. No events overnight.   MRI cervical spine. Shows no compression or significant abnormality  Intermittent cough with secretions. Still has right hemiparesis. LP concerning for viral  meningitis. ID following.    OBJECTIVE Temp:  [98.3 F (36.8 C)-98.8 F (37.1 C)] 98.7 F (37.1 C) (12/25 0518) Pulse Rate:  [73-86] 73 (12/25 0518) Cardiac Rhythm: Normal sinus rhythm (12/25 0700) Resp:  [15-21] 20 (12/25 0518) BP: (145-165)/(60-92) 147/60 (12/25 0518) SpO2:  [94 %-99 %] 94 % (12/25 0518) Weight:  [139 lb 1.8 oz (63.1 kg)] 139 lb 1.8 oz (63.1 kg) (12/25 0518)  Recent Labs  Lab 11/24/17 2055 11/25/17 0004 11/25/17 0510 11/25/17 0815 11/25/17 1146  GLUCAP 189* 170* 191* 189* 188*   Recent Labs  Lab 11/20/17 0728 11/21/17 0540 11/22/17 0514 11/23/17 0354 11/24/17 0307  NA 138 137 138 140 144  K 4.3 3.9 3.9 3.4* 3.3*  CL 104 103 107 109 114*  CO2 21* 23 22 24 24   GLUCOSE 90 104* 97 195* 158*  BUN 11 13 15 18 17   CREATININE 1.12 1.23 1.17 1.06 0.90  CALCIUM 9.2 8.8* 8.3* 8.2* 8.2*   Recent Labs  Lab 11/19/17 1620  AST 24  ALT 17  ALKPHOS 65  BILITOT 0.7  PROT 6.7  ALBUMIN 4.2   Recent Labs  Lab 11/19/17 1620 11/20/17 0728 11/21/17 0540 11/22/17 0514 11/23/17 0354 11/24/17 0307  WBC 6.5 8.3 8.3 8.0 8.1 7.5  NEUTROABS 4.8  --   --   --   --  5.6  HGB 11.7* 11.6* 11.5* 10.9* 10.8* 10.1*  HCT 35.6* 34.9* 34.3* 32.6* 32.0* 29.8*  MCV 94.3 92.1 91.5 91.8 92.0 92.0  PLT 240 232 212 212 203 180   Recent Labs  Lab 11/19/17 1620 11/22/17 0514  TROPONINI 0.05* 0.12*   No results for input(s): LABPROT, INR in the last 72 hours. Recent Labs    11/23/17 1435  COLORURINE YELLOW  LABSPEC 1.025  PHURINE 5.0  GLUCOSEU 50*  HGBUR NEGATIVE  BILIRUBINUR NEGATIVE  KETONESUR 5*  PROTEINUR 100*  NITRITE NEGATIVE  LEUKOCYTESUR NEGATIVE       Component Value Date/Time   CHOL 129 11/20/2017 0559   TRIG 56 11/20/2017 0559   HDL 71 11/20/2017 0559   CHOLHDL 1.8 11/20/2017 0559   VLDL 11 11/20/2017 0559   LDLCALC 47 11/20/2017 0559   Lab Results  Component Value Date   HGBA1C 7.3 (H) 11/20/2017  Component Value Date/Time    LABOPIA NONE DETECTED 11/20/2017 1055   COCAINSCRNUR NONE DETECTED 11/20/2017 1055   LABBENZ NONE DETECTED 11/20/2017 1055   AMPHETMU NONE DETECTED 11/20/2017 1055   THCU NONE DETECTED 11/20/2017 1055   LABBARB NONE DETECTED 11/20/2017 1055    No results for input(s): ETH in the last 168 hours.   IMAGING  I have personally reviewed the radiological images below and agree with the radiology interpretations.   MR Cervical Spine With and Without Contrast - pending   Ct Angio Head and neck and CTP W Or Wo Contrast 11/19/2017 IMPRESSION:  CT HEAD:  1. No acute intracranial process.  2. Old RIGHT basal ganglia lacunar infarct, otherwise negative noncontrast CT HEAD for age.   CTA NECK:  1. Atherosclerosis without hemodynamically significant stenosis.  2. Severe stenosis versus occluded LEFT vertebral artery origin with immediate reconstitution, diminutive LEFT vertebral artery.  3. **An incidental finding of potential clinical significance has been found.  Multiple LEFT upper lobe pulmonary nodules including 10 mm spiculated nodule. Recommend contrast-enhanced CT chest on a nonemergent basis to assess for extent of involvement. **    CTA HEAD:  1. No emergent large vessel occlusion or severe stenosis anterior circulation.  2. Severe stenosis versus tandem occlusion diminutive LEFT vertebral artery.   RIGHT vertebral artery is dominant.    CT PERFUSION:  1. Small perfusion defect LEFT parietal lobe/posterior watershed territory suggesting brain at risk.  Aortic Atherosclerosis (ICD10-I70.0) and Emphysema (ICD10-J43.9).   Mr Brain Wo Contrast 11/20/2017 IMPRESSION:  1. No acute intracranial abnormality.  2. Chronic microvascular ischemia and old right corona radiata lacunar infarct.  3. Loss of the normal left vertebral artery flow void, consistent with the severely diminutive left vertebral artery demonstrated on the earlier CTA.     Ct Head Code Stroke Wo  Contrast 11/19/2017 IMPRESSION:  1. No evidence of acute intracranial abnormality.  2. ASPECTS is 10.  3. Mild chronic small vessel ischemic disease and cerebral atrophy.   Chronic right basal ganglia lacunar infarct.    EEG: 11/20/2017 This EEG is abnormal due to diffuse slowing of the waking background. Clinical Correlation of the above findings indicates diffuse cerebral dysfunction that is non-specific in etiology and can be seen with hypoxic/ischemic injury, toxic/metabolic encephalopathies, neurodegenerative disorders, or medication effect.  However, findings may also be due to excessive drowsiness.  Clinical correlation advised.   EEG: 11/21/2017 Impression: This awake and asleep EEG is abnormal due to occasional focal slowing over the left hemisphere, maximal over the left frontocentrotemporal region.  Clinical Correlation of the above findings indicates focal cerebral dysfunction over the left hemisphere region suggestive of underlying structural or physiologic abnormality. The absence of epileptiform discharges does not exclude a clinical diagnosis of epilepsy. Clinical correlation is advised.   [name] Contrast 11/20/2017 IMPRESSION:  1. No acute intracranial abnormality identified. No evidence for acute infarct or hemorrhage status post tPA administration. No imaging findings to suggest acute CNS infection.  2. Stable atrophy with chronic small vessel ischemic disease with old right corona radiata lacunar infarct.  3. Right mastoid effusion.    Dg Chest Port 1 View 11/20/2017 IMPRESSION:  Small area of infiltrate or increased atelectasis in the left lung base, new since the prior CT scan.    Dg Chest Port 1 View 11/21/2017 IMPRESSION: 1. No significant change from the previous day's study. 2. Left medial lung base opacity is again noted consistent with pneumonia or atelectasis.    TTE  -  Left ventricle: The cavity size was normal. Systolic function  was   normal. The estimated ejection fraction was in the range of 50%   to 55%. Hypokinesis of the apical myocardium. Doppler parameters   are consistent with abnormal left ventricular relaxation (grade 1   diastolic dysfunction). Doppler parameters are consistent with   high ventricular filling pressure. - Aortic valve: Transvalvular velocity was within the normal range.   There was no stenosis. There was no regurgitation. Valve area   (VTI): 2.8 cm^2. Valve area (Vmax): 2.67 cm^2. Valve area   (Vmean): 2.38 cm^2. - Mitral valve: Transvalvular velocity was within the normal range.   There was no evidence for stenosis. There was mild regurgitation. - Left atrium: The atrium was moderately dilated. - Right ventricle: The cavity size was normal. Wall thickness was   normal. Systolic function was normal. - Right atrium: The atrium was mildly dilated. - Atrial septum: No defect or patent foramen ovale was identified. - Tricuspid valve: There was no regurgitation.  MRI Cervical spine : No concerning features of meningeal enhancement or cord hyperintensity.Ordinary spondylosis, without features of discitis  PHYSICAL EXAM Vitals:   11/24/17 1800 11/24/17 1900 11/24/17 2037 11/25/17 0518  BP: (!) 164/74 (!) 149/92 (!) 165/75 (!) 147/60  Pulse: 79 81 86 73  Resp: 20 18 15 20   Temp:   98.8 F (37.1 C) 98.7 F (37.1 C)  TempSrc:   Oral Axillary  SpO2: 97% 97% 95% 94%  Weight:    139 lb 1.8 oz (63.1 kg)  Height:        General - Well nourished, well developed, still obtunded and not following commands.  Ophthalmologic - fundi not visualized due to noncooperation.  Cardiovascular - Regular rate and rhythm.  Neuro - obtunded, not following commands. Eyes closed, but able to open with voice. Nonverbal, not able to name or repeat,   following only few occasional commands. Inconsistently blinking to visual threat bilaterally. Eyes mainly natural position, not movement bilaterally as  command, doll's eye present. PREEL. Positive corneal and gag. LUE and LLE move spontaneously and against gravity, RUE 2/5 and RLE 3/5 on pain. DTR 1+ and not cooperative on babinski. Sensation, coordination and gait not tested.   ASSESSMENT/PLAN Mr. Peter Becker is a 83 y.o. male with history of DM, HTN, MI s/p CABG, PVD, and HLD on lovastatin admitted for AMS, confusion, aphasia, wax and waning process. TPA given at OSH.    AMS with intermittent fever - Seizure with Todd paralysis vs. CNS infection -meningoencephalitis presentation concerning for seizure like activity and CSF concerning for viral meningitis    Resultant AMS, nonverbal, agitation, left hemiparesis  MRI  No acute stroke  MRI brain repeat with and without contrast negative  CTA head and neck b/l ICA proximal and right VA origin athero, left VA hypoplastic  2D Echo EF 50-55%  EEG - diffuse slowing no seizure  TSH/B12 and ammonia WNL  LP showed elevated RBC, WBC and protein, concerning for viral meningitis. ID consulted  Blood culture - no growth day 1  Repeat EEG - see above  LDL 47  HgbA1c 7.3  SCDs for VTE prophylaxis  No diet orders on file   aspirin 81 mg daily prior to admission, now on No antithrombotic  Therapy recommendations:  pending  Disposition:  Pending  ? pneumonia   CXR showed new small area of infiltration left lung base  Repeat CXR - no significant change - possible pneumonia - not currently on  antibiotics.  Has cough with secretion  Intermittent fever  Diabetes  HgbA1c 7.3 goal < 7.0  Uncontrolled  Home meds -glyburide  CBG monitoring  SSI  Hypertension Stable  Long term BP goal normotensive  Hyperlipidemia  Home meds:  lovastatin   LDL 47, goal < 70  Resume once po access  Continue statin at discharge  Other Stroke Risk Factors  Advanced age  CAD/MI s/p CABG  PVD   Seizure Activity  Now on Depacon - 500 mg IV every 8 hours - level optimal at  71  EEGs 2 - abnormal as noted above   Other Active Problems  Tmax - 100.9 -> 98.2 Saturday  Multiple LEFT upper lobe pulmonary nodules including 10 mm spiculated nodule. Contrast-enhanced CT chest on a nonemergent basis to assess for extent of involvement recommended.  Possible pneumonia - consider antibiotics will call CCM/medical hospitalist team to consult and help manage  Tube feeds started, nutrition consulted  Hospital day # 6   Continue antibiotics for treatment for pneumonia and panda tube for feeding. Patient is likely going to need a PEG tube due to significant dysarthria and dysphagia swallowing function is not going to improve soon. Long discussion of the bedside with the patient's wife and son as well as with palliative care team nurse practitioner  about goals of care and answered questions. Consider checking paraneoplastic antibody panel given evidence of pulmonary nodules on CT chest. Discussed with Dr. Isidoro Donning. Greater than 50% time during this 35 minute visit was spent on counseling and coordination of care about his neurological presentation plan for treatment and evaluation answering questions.  Delia Heady, MD Medical Director [facility] Pager: [phone] 11/25/2017 12:33 PM   To contact Stroke Continuity provider, please refer to [email or web address]. After hours, contact General Neurology

## 2017-11-25 NOTE — Progress Notes (Addendum)
Pharmacy Antibiotic Note  Peter FretRalph Becker is a 83 y.o. male admitted on 11/19/2017 as a Code Stroke.  Pharmacy has been consulted for vancomycin dosing. Pt is afebrile and WBC is WNL. SCr WNL. D#3 of antibiotics. Scr trending down, but stable.   Plan: Vancomycin d/c'ed Continue cefepime 1g IV q24h Monitor clinic progress, WBC, TMax, renal function, electrolytes F/u BCx, C/S, future de-escalation, & length of therapy  Height: 5\' 7"  (170.2 cm) Weight: 139 lb 1.8 oz (63.1 kg) IBW/kg (Calculated) : 66.1  Temp (24hrs), Avg:98.5 F (36.9 C), Min:98.1 F (36.7 C), Max:98.8 F (37.1 C)  Recent Labs  Lab 11/20/17 0728 11/21/17 0540 11/22/17 0514 11/23/17 0354 11/24/17 0307  WBC 8.3 8.3 8.0 8.1 7.5  CREATININE 1.12 1.23 1.17 1.06 0.90    Estimated Creatinine Clearance: 52.6 mL/min (by C-G formula based on SCr of 0.9 mg/dL).    Allergies  Allergen Reactions  . Tape Other (See Comments)    SKIN IS VERY THIN AND TEARS AND BRUISES EASILY; Please use an alternative!!    Antimicrobials this admission: Vanc 12/23>>12/25 Cefepime 12/23>> Acyclovir 12/20>>12/22  Dose adjustments this admission: N/A  Microbiology results: 12/20BCx: NGTD 12/20 CSF: NGTD 12/20MRSA PCR: neg  Thank you for allowing pharmacy to be a part of this patient's care.  Peter Becker Peter Becker, PharmD PGY1 Acute Care Pharmacy Resident Pager: [phone] 11/25/2017 9:42 AM

## 2017-11-25 NOTE — Progress Notes (Signed)
Patient ID: Rosana FretRalph Beebe, male   DOB: 08/27/1929, 83 y.o.   MRN: [id number]  This NP visited patient at the bedside as a follow up to  yesterday's GOCs meeting wife and son at bedside.  Dr Pearlean BrownieSethi enterd the room, examined the patient and then  updated family regarding current medical situation.     This NP continued conversation regarding  diagnosis, prognosis,  viable treatment options, advanced directive decsiosn and GOC and anticipatory care needs.   We discussed the concept of overall failure to thrive specifically as it relates to our own human mortality.  Discussed risks and benefits of artificial feeding via a PEG tube.  Detailed and discussed the difference between an aggressive medical intervention path and a palliative comfort care path for this patient at this time in this situation.  Questions and concerns addressed   Patient's wife tells me she has "put this in God's hands"  Discussed with patient the importance of continued conversation with family and their  medical providers regarding overall plan of care and treatment options,  ensuring decisions are within the context of the patients values and GOCs.   Time in   1120        Time out   1155  Total time spent on the unit was 45 minutes   Greater than 50% of the time was spent in counseling and coordination of care  Lorinda CreedMary Chitara Clonch NP  Palliative Medicine Team Team Phone # [phone] Pager [phone]

## 2017-11-25 NOTE — Progress Notes (Signed)
Pt. still unable to void; bladder scan by NT and several quadrants with > 250cc urine; Kirby,NP notified and orders given.

## 2017-11-25 NOTE — Progress Notes (Addendum)
TRH progress note                                                                               Patient Demographics  Peter Becker, is a 83 y.o. male, DOB - Aug 16, 1929, ZOX:[id number]  Admit date - 11/19/2017   Admitting Physician Caryl Pina, MD  Outpatient Primary MD for the patient is Danella Penton, MD  Outpatient specialists:   LOS - 6  days   Medical records reviewed and are as summarized below:    No chief complaint on file.      Brief summary   Peter Becker is an 83 y.o. male with a past medical history of diabetes, hypertension, peripheral arterial disease, and  coronary artery disease who was admitted for CVA evaluation after onset of weakness and aphasia at 2pm on 12/19.  Pt has had continued weakness and aphasia.Mri showed no evidence of acute stroke.  Pt has been seen by Infectious disease for LP due to possible concern for meningitis.  Pt had a chest xray today that shows progressive left lower lobe pneumonia TRH medicine service was consulted for coughing, left lower lobe pneumonia. On 12/24, Dr. Pearlean Brownie requested to [facility] to assume care from 12/25 given multiple medical problems.   Assessment & Plan    Active Problems:   Ischemic stroke [facility]) -Management per neurology.  TPA given at OSH  -MRI showed no acute stroke, MRI brain repeat with and without contrast also negative,  - CTA head and neck showed bilateral ICA proximal and right VA origin atherosclerosis, left would be a hypoplastic -2D echo showed EF of 50-55%, EEG showed diffuse slowing but no seizures. -LDL 47, hemoglobin A1c 7.3. Failed swallow evaluation, on tube feeds -Patient on aspirin 81 mg daily PTA, started back on aspirin via tube.     Altered mental status with intermittent fevers-seizure with Todd's paralysis versus viral meningitis -Possibly due to acute stroke, seizure, meningoencephalitis -Patient is currently alert and awake, mental status improving however still nonverbal and  intermittently follows commands  HCAP -Possibly has a component of aspiration, on tube feeds, secretions with coughing -Patient was placed on IV broad-spectrum antibiotics including vancomycin and cefepime. -De-escalate antibiotics to IV cefepime -CT chest showed consolidation in the left lower lobe compatible with pneumonia, small left pleural effusion, numerous small scattered pulmonary nodules 5 mm or less in size, stable since prior studies in 2016, benign    Meningoencephalitis -During this hospitalization, LP showed elevated RBCs, WBC and protein concerning for viral meningitis.  ID was consulted -Continue acyclovir    Seizure (HCC) -EEG showed diffuse slowing no seizures, ammonia within normal limits, TSH, B12 normal -Currently on Depakote, management per neurology    Benign essential HTN -Currently stable    Diabetes mellitus type 2 in nonobese (HCC) -Continue sliding scale insulin, avoid hypoglycemia  Dysphagia -Currently on tube feeds, repeat SLP evaluation in a.m.  If fails, will need to discuss PEG tube/artificial feeding with the family.  Palliative care following -PT evaluation recommended CIR, consult placed  Code Status: DNR DVT Prophylaxis:   SCD's Family Communication: Discussed in detail with the patient, all imaging results, lab  results explained to the patient's wife    Disposition Plan:   Time Spent in minutes 25 minutes  Procedures:    Antimicrobials:   IV vancomycin 12/23 > 12/25  IV cefepime 12/23 >   Medications  Scheduled Meds: . aspirin  81 mg Per Tube Daily  . [START ON 11/26/2017] calcium-vitamin D  1 tablet Per Tube Q breakfast  . chlorhexidine  15 mL Mouth Rinse BID  . cholecalciferol  2,000 Units Per Tube Daily  . fluticasone  2 spray Each Nare Daily  . glyBURIDE  5 mg Per Tube BID WC   And  . metFORMIN  1,000 mg Per Tube BID WC  . insulin aspart  0-9 Units Subcutaneous Q4H  . losartan  25 mg Per Tube Daily  . mouth rinse  15  mL Mouth Rinse q12n4p  . metoprolol tartrate  12.5 mg Per Tube BID  . multivitamin with minerals  1 tablet Per Tube Daily  . omega-3 acid ethyl esters  1 g Per Tube Daily  . pantoprazole (PROTONIX) IV  40 mg Intravenous QHS  . pravastatin  10 mg Per Tube q1800   Continuous Infusions: . ceFEPime (MAXIPIME) IV Stopped (11/24/17 1748)  . feeding supplement (JEVITY 1.2 CAL) 1,000 mL (11/25/17 0517)  . valproic acid (DEPACON) IVPB Stopped (11/25/17 0941)   PRN Meds:.acetaminophen, labetalol, meloxicam, traMADol   Antibiotics   Anti-infectives (From admission, onward)   Start     Dose/Rate Route Frequency Ordered Stop   11/23/17 1430  vancomycin (VANCOCIN) 500 mg in sodium chloride 0.9 % 100 mL IVPB  Status:  Discontinued     500 mg 100 mL/hr over 60 Minutes Intravenous Every 12 hours 11/23/17 1314 11/25/17 0939   11/23/17 1400  ceFEPIme (MAXIPIME) 1 g in dextrose 5 % 50 mL IVPB  Status:  Discontinued     1 g 100 mL/hr over 30 Minutes Intravenous Every 8 hours 11/23/17 1308 11/23/17 1310   11/23/17 1400  ceFEPIme (MAXIPIME) 1 g in dextrose 5 % 50 mL IVPB     1 g 100 mL/hr over 30 Minutes Intravenous Every 24 hours 11/23/17 1311     11/20/17 2200  acyclovir (ZOVIRAX) 560 mg in dextrose 5 % 100 mL IVPB  Status:  Discontinued     10 mg/kg  56.2 kg 111.2 mL/hr over 60 Minutes Intravenous Every 12 hours 11/20/17 2118 11/22/17 1300        Subjective:   Peter Becker was seen and examined today.  Alert, awake, difficult to obtain any review of system from the patient.  No acute issues overnight.  No fevers.  Objective:   Vitals:   11/24/17 1800 11/24/17 1900 11/24/17 2037 11/25/17 0518  BP: (!) 164/74 (!) 149/92 (!) 165/75 (!) 147/60  Pulse: 79 81 86 73  Resp: 20 18 15 20   Temp:   98.8 F (37.1 C) 98.7 F (37.1 C)  TempSrc:   Oral Axillary  SpO2: 97% 97% 95% 94%  Weight:    63.1 kg (139 lb 1.8 oz)  Height:        Intake/Output Summary (Last 24 hours) at 11/25/2017 1211 Last  data filed at 11/25/2017 0630 Gross per 24 hour  Intake 1860 ml  Output 400 ml  Net 1460 ml     Wt Readings from Last 3 Encounters:  11/25/17 63.1 kg (139 lb 1.8 oz)  11/19/17 56.2 kg (124 lb)  01/25/17 60.8 kg (134 lb)     Exam   General: Alert  and awake, NAD, nonverbal  Eyes:  HEENT:    Cardiovascular: S1 S2 clear, RRR  No pedal edema b/l  Respiratory: Scattered coarse rhonchi bilaterally  Gastrointestinal: Soft, nontender, nondistended, + bowel sounds  Ext: no pedal edema bilaterally  Neuro: right-sided weakness  Musculoskeletal: No digital cyanosis, clubbing  Skin: No rashes  Psych: confused, nonverbal   Data Reviewed:  I have personally reviewed following labs and imaging studies  Micro Results Recent Results (from the past 240 hour(s))  MRSA PCR Screening     Status: None   Collection Time: 11/19/17  9:24 PM  Result Value Ref Range Status   MRSA by PCR NEGATIVE NEGATIVE Final    Comment:        The GeneXpert MRSA Assay (FDA approved for NASAL specimens only), is one component of a comprehensive MRSA colonization surveillance program. It is not intended to diagnose MRSA infection nor to guide or monitor treatment for MRSA infections.   Culture, blood (routine x 2)     Status: None   Collection Time: 11/20/17 11:05 AM  Result Value Ref Range Status   Specimen Description BLOOD LEFT ANTECUBITAL  Final   Special Requests IN PEDIATRIC BOTTLE Blood Culture adequate volume  Final   Culture NO GROWTH 5 DAYS  Final   Report Status 11/25/2017 FINAL  Final  Culture, blood (routine x 2)     Status: None   Collection Time: 11/20/17 11:10 AM  Result Value Ref Range Status   Specimen Description BLOOD LEFT ANTECUBITAL  Final   Special Requests IN PEDIATRIC BOTTLE Blood Culture adequate volume  Final   Culture NO GROWTH 5 DAYS  Final   Report Status 11/25/2017 FINAL  Final  CSF culture     Status: None   Collection Time: 11/20/17  6:46 PM  Result  Value Ref Range Status   Specimen Description CSF  Final   Special Requests NONE  Final   Gram Stain   Final    WBC PRESENT, PREDOMINANTLY PMN NO ORGANISMS SEEN CYTOSPIN SMEAR    Culture NO GROWTH 3 DAYS  Final   Report Status 11/24/2017 FINAL  Final  Anaerobic culture     Status: None   Collection Time: 11/20/17  6:46 PM  Result Value Ref Range Status   Specimen Description CSF  Final   Special Requests NONE  Final   Culture NO ANAEROBES ISOLATED  Final   Report Status 11/25/2017 FINAL  Final  Culture, blood (routine x 2) Call MD if unable to obtain prior to antibiotics being given     Status: None (Preliminary result)   Collection Time: 11/23/17  3:35 PM  Result Value Ref Range Status   Specimen Description BLOOD LEFT ANTECUBITAL  Final   Special Requests IN PEDIATRIC BOTTLE Blood Culture adequate volume  Final   Culture NO GROWTH 2 DAYS  Final   Report Status PENDING  Incomplete  Culture, blood (routine x 2) Call MD if unable to obtain prior to antibiotics being given     Status: None (Preliminary result)   Collection Time: 11/23/17  3:41 PM  Result Value Ref Range Status   Specimen Description BLOOD RIGHT ANTECUBITAL  Final   Special Requests   Final    BOTTLES DRAWN AEROBIC ONLY Blood Culture adequate volume   Culture NO GROWTH 2 DAYS  Final   Report Status PENDING  Incomplete    Radiology Reports Ct Angio Head W Or Wo Contrast  Result Date: 11/19/2017 CLINICAL DATA:  Acute onset  dizziness and aphasia at 1400 hours. RIGHT-sided weakness. Assess stroke. EXAM: CT ANGIOGRAPHY HEAD AND NECK CT PERFUSION BRAIN TECHNIQUE: Multidetector CT imaging of the head and neck was performed using the standard protocol during bolus administration of intravenous contrast. Multiplanar CT image reconstructions and MIPs were obtained to evaluate the vascular anatomy. Carotid stenosis measurements (when applicable) are obtained utilizing NASCET criteria, using the distal internal carotid  diameter as the denominator. Multiphase CT imaging of the brain was performed following IV bolus contrast injection. Subsequent parametric perfusion maps were calculated using RAPID software. CONTRAST:  ISOVUE-370 IOPAMIDOL (ISOVUE-370) INJECTION 76% COMPARISON:  CT HEAD November 19, 2017 at 1627 hours FINDINGS: CT HEAD FINDINGS BRAIN: No intraparenchymal hemorrhage, mass effect nor midline shift. The ventricles and sulci are normal for age. Patchy supratentorial white matter hypodensities within normal range for patient's age, though non-specific are most compatible with chronic small vessel ischemic disease. Punctate LEFT frontal lobe calcification. Old RIGHT basal ganglia lacunar infarct. No acute large vascular territory infarcts. No abnormal extra-axial fluid collections. Basal cisterns are patent. VASCULAR: Moderate calcific atherosclerosis of the carotid siphons in RIGHT vertebral artery. SKULL: No skull fracture. No significant scalp soft tissue swelling. SINUSES/ORBITS: Mild lobulated paranasal sinus mucosal thickening without air-fluid levels. Mastoid air cells are well aerated.The included ocular globes and orbital contents are non-suspicious. Status post bilateral ocular lens implants. OTHER: None. CTA NECK AORTIC ARCH: Normal appearance of the thoracic arch, normal branch pattern. Moderate calcific atherosclerosis aortic arch. The origins of the innominate, left Common carotid artery and subclavian artery are widely patent. RIGHT CAROTID SYSTEM: Common carotid artery is widely patent, mild calcific atherosclerosis. Moderate calcific atherosclerosis carotid bifurcation without hemodynamically significant stenosis by NASCET criteria. Normal appearance of the internal carotid artery. LEFT CAROTID SYSTEM: Common carotid artery is widely patent, mild calcific atherosclerosis. Moderate calcific atherosclerosis without hemodynamically significant stenosis by NASCET criteria. Focal luminal irregularity  LEFT internal carotid artery origin, favoring sequelae of atherosclerosis, less likely pseudo aneurysm. VERTEBRAL ARTERIES:RIGHT vertebral artery is dominant. Calcific atherosclerosis resulting in severe stenosis RIGHT vertebral artery. The severe stenosis versus occluded LEFT P1 origin with immediate reconstitution, at diminutive thready vessel. SKELETON: No acute osseous process though bone windows have not been submitted. Patient is edentulous. Multilevel moderate to severe RIGHT cervical facet arthropathy. OTHER NECK: Soft tissues of the neck are nonacute though, not tailored for evaluation. UPPER CHEST: Tiny scattered centrilobular ground-glass nodules most compatible with respiratory bronchiolitis. Centrilobular emphysema. Multiple LEFT upper lobe pulmonary nodules including 10 mm spiculated nodule (series 15, image 221/280). LEFT upper lobe calcified granuloma. No superior mediastinal lymphadenopathy. OTHER: None. CTA HEAD ANTERIOR CIRCULATION: Patent cervical internal carotid arteries, petrous, cavernous and supra clinoid internal carotid arteries. Atherosclerosis resulting in mild stenosis RIGHT supraclinoid internal carotid artery. Patent anterior communicating artery. Patent anterior and middle cerebral arteries. No large vessel occlusion, significant stenosis, contrast extravasation or aneurysm. POSTERIOR CIRCULATION: Patent RIGHT vertebral artery, vertebrobasilar junction and basilar artery, as well as main branch vessels. Extremely diminutive LEFT vertebral artery, with tandem severe stenosis versus occlusion and reconstitution. Bilateral posterior inferior cerebellar arteries are patent. Small LEFT P1 segment, robust LEFT posterior communicating artery. Patent posterior cerebral arteries. No large vessel occlusion, significant stenosis, contrast extravasation or aneurysm. VENOUS SINUSES: Major dural venous sinuses are patent though not tailored for evaluation on this angiographic examination. ANATOMIC  VARIANTS: None. DELAYED PHASE: Not performed. MIP images reviewed. CT Brain Perfusion Findings: CBF (<30%) Volume: 0mL Perfusion (Tmax>6.0s) volume: 9mL Mismatch Volume: 9mL Infarction Location:LEFT posterior parietal  lobe. Generalized prolonged T-max LEFT MCA and posterior watershed territory with preserved flow, potentially from ICA stenosis though, preserved LEFT ACA profusion. IMPRESSION: CT HEAD: 1. No acute intracranial process. 2. Old RIGHT basal ganglia lacunar infarct, otherwise negative noncontrast CT HEAD for age. CTA NECK: 1. Atherosclerosis without hemodynamically significant stenosis. 2. Severe stenosis versus occluded LEFT vertebral artery origin with immediate reconstitution, diminutive LEFT vertebral artery. 3. **An incidental finding of potential clinical significance has been found. Multiple LEFT upper lobe pulmonary nodules including 10 mm spiculated nodule. Recommend contrast-enhanced CT chest on a nonemergent basis to assess for extent of involvement. ** CTA HEAD: 1. No emergent large vessel occlusion or severe stenosis anterior circulation. 2. Severe stenosis versus tandem occlusion diminutive LEFT vertebral artery. RIGHT vertebral artery is dominant. CT PERFUSION: 1. Small perfusion defect LEFT parietal lobe/posterior watershed territory suggesting brain at risk. Acute findings discussed with and reconfirmed by Dr.Lindzen, Neurology on 11/19/2017 at 6:35 pm. Aortic Atherosclerosis (ICD10-I70.0) and Emphysema (ICD10-J43.9). Electronically Signed   By: Awilda Metroourtnay  Bloomer M.D.   On: 11/19/2017 18:36   Dg Chest 2 View  Result Date: 11/23/2017 CLINICAL DATA:  History of cerebral infarction.  Pneumonia. EXAM: CHEST  2 VIEW COMPARISON:  11/21/2017 FINDINGS: Stable mild cardiac enlargement. Feeding tube present extending below the diaphragm. Lungs show progressive airspace disease and consolidation of the left lower lobe consistent with pneumonia. There may be some associated left pleural fluid.  No edema or pneumothorax. IMPRESSION: Progressive left lower lobe pneumonia. There may be some associated left pleural fluid. Electronically Signed   By: Irish LackGlenn  Yamagata M.D.   On: 11/23/2017 11:13   Ct Angio Neck W Or Wo Contrast  Result Date: 11/19/2017 CLINICAL DATA:  Acute onset dizziness and aphasia at 1400 hours. RIGHT-sided weakness. Assess stroke. EXAM: CT ANGIOGRAPHY HEAD AND NECK CT PERFUSION BRAIN TECHNIQUE: Multidetector CT imaging of the head and neck was performed using the standard protocol during bolus administration of intravenous contrast. Multiplanar CT image reconstructions and MIPs were obtained to evaluate the vascular anatomy. Carotid stenosis measurements (when applicable) are obtained utilizing NASCET criteria, using the distal internal carotid diameter as the denominator. Multiphase CT imaging of the brain was performed following IV bolus contrast injection. Subsequent parametric perfusion maps were calculated using RAPID software. CONTRAST:  100mL ISOVUE-370 IOPAMIDOL (ISOVUE-370) INJECTION 76% COMPARISON:  CT HEAD November 19, 2017 at 1627 hours FINDINGS: CT HEAD FINDINGS BRAIN: No intraparenchymal hemorrhage, mass effect nor midline shift. The ventricles and sulci are normal for age. Patchy supratentorial white matter hypodensities within normal range for patient's age, though non-specific are most compatible with chronic small vessel ischemic disease. Punctate LEFT frontal lobe calcification. Old RIGHT basal ganglia lacunar infarct. No acute large vascular territory infarcts. No abnormal extra-axial fluid collections. Basal cisterns are patent. VASCULAR: Moderate calcific atherosclerosis of the carotid siphons in RIGHT vertebral artery. SKULL: No skull fracture. No significant scalp soft tissue swelling. SINUSES/ORBITS: Mild lobulated paranasal sinus mucosal thickening without air-fluid levels. Mastoid air cells are well aerated.The included ocular globes and orbital contents are  non-suspicious. Status post bilateral ocular lens implants. OTHER: None. CTA NECK AORTIC ARCH: Normal appearance of the thoracic arch, normal branch pattern. Moderate calcific atherosclerosis aortic arch. The origins of the innominate, left Common carotid artery and subclavian artery are widely patent. RIGHT CAROTID SYSTEM: Common carotid artery is widely patent, mild calcific atherosclerosis. Moderate calcific atherosclerosis carotid bifurcation without hemodynamically significant stenosis by NASCET criteria. Normal appearance of the internal carotid artery. LEFT CAROTID SYSTEM: Common carotid  artery is widely patent, mild calcific atherosclerosis. Moderate calcific atherosclerosis without hemodynamically significant stenosis by NASCET criteria. Focal luminal irregularity LEFT internal carotid artery origin, favoring sequelae of atherosclerosis, less likely pseudo aneurysm. VERTEBRAL ARTERIES:RIGHT vertebral artery is dominant. Calcific atherosclerosis resulting in severe stenosis RIGHT vertebral artery. The severe stenosis versus occluded LEFT P1 origin with immediate reconstitution, at diminutive thready vessel. SKELETON: No acute osseous process though bone windows have not been submitted. Patient is edentulous. Multilevel moderate to severe RIGHT cervical facet arthropathy. OTHER NECK: Soft tissues of the neck are nonacute though, not tailored for evaluation. UPPER CHEST: Tiny scattered centrilobular ground-glass nodules most compatible with respiratory bronchiolitis. Centrilobular emphysema. Multiple LEFT upper lobe pulmonary nodules including 10 mm spiculated nodule (series 15, image 221/280). LEFT upper lobe calcified granuloma. No superior mediastinal lymphadenopathy. OTHER: None. CTA HEAD ANTERIOR CIRCULATION: Patent cervical internal carotid arteries, petrous, cavernous and supra clinoid internal carotid arteries. Atherosclerosis resulting in mild stenosis RIGHT supraclinoid internal carotid artery.  Patent anterior communicating artery. Patent anterior and middle cerebral arteries. No large vessel occlusion, significant stenosis, contrast extravasation or aneurysm. POSTERIOR CIRCULATION: Patent RIGHT vertebral artery, vertebrobasilar junction and basilar artery, as well as main branch vessels. Extremely diminutive LEFT vertebral artery, with tandem severe stenosis versus occlusion and reconstitution. Bilateral posterior inferior cerebellar arteries are patent. Small LEFT P1 segment, robust LEFT posterior communicating artery. Patent posterior cerebral arteries. No large vessel occlusion, significant stenosis, contrast extravasation or aneurysm. VENOUS SINUSES: Major dural venous sinuses are patent though not tailored for evaluation on this angiographic examination. ANATOMIC VARIANTS: None. DELAYED PHASE: Not performed. MIP images reviewed. CT Brain Perfusion Findings: CBF (<30%) Volume: 0mL Perfusion (Tmax>6.0s) volume: 9mL Mismatch Volume: 9mL Infarction Location:LEFT posterior parietal lobe. Generalized prolonged T-max LEFT MCA and posterior watershed territory with preserved flow, potentially from ICA stenosis though, preserved LEFT ACA profusion. IMPRESSION: CT HEAD: 1. No acute intracranial process. 2. Old RIGHT basal ganglia lacunar infarct, otherwise negative noncontrast CT HEAD for age. CTA NECK: 1. Atherosclerosis without hemodynamically significant stenosis. 2. Severe stenosis versus occluded LEFT vertebral artery origin with immediate reconstitution, diminutive LEFT vertebral artery. 3. **An incidental finding of potential clinical significance has been found. Multiple LEFT upper lobe pulmonary nodules including 10 mm spiculated nodule. Recommend contrast-enhanced CT chest on a nonemergent basis to assess for extent of involvement. ** CTA HEAD: 1. No emergent large vessel occlusion or severe stenosis anterior circulation. 2. Severe stenosis versus tandem occlusion diminutive LEFT vertebral artery.  RIGHT vertebral artery is dominant. CT PERFUSION: 1. Small perfusion defect LEFT parietal lobe/posterior watershed territory suggesting brain at risk. Acute findings discussed with and reconfirmed by Dr.Lindzen, Neurology on 11/19/2017 at 6:35 pm. Aortic Atherosclerosis (ICD10-I70.0) and Emphysema (ICD10-J43.9). Electronically Signed   By: Awilda Metroourtnay  Bloomer M.D.   On: 11/19/2017 18:36   Ct Chest W Contrast  Result Date: 11/23/2017 CLINICAL DATA:  Pneumonia EXAM: CT CHEST WITH CONTRAST TECHNIQUE: Multidetector CT imaging of the chest was performed during intravenous contrast administration. CONTRAST:  75mL ISOVUE-300 IOPAMIDOL (ISOVUE-300) INJECTION 61% COMPARISON:  11/23/2017. Chest CT 09/22/2017, remote films dating back to 07/17/2015. FINDINGS: Cardiovascular: Prior CABG. Cardiomegaly. Moderate aortic calcifications. No aneurysm or adenopathy. Mediastinum/Nodes: No mediastinal, hilar, or axillary adenopathy. Lungs/Pleura: Small left pleural effusion. Consolidation in the left lower lobe compatible with pneumonia. No central obstructing mass or endobronchial lesion. Clustered nodules in the right middle lobe, the largest 5 mm. Small nodules in the lingula on image 71, 5 mm or less. Numerous scattered similarly sized nodules in both lungs.  Upper Abdomen: Imaging into the upper abdomen shows no acute findings. Musculoskeletal: Chest wall soft tissues are unremarkable. No acute bony abnormality. IMPRESSION: Consolidation in the left lower lobe compatible with pneumonia. Small left pleural effusion. Numerous small scattered pulmonary nodules, 5 mm or less in size. These are stable since prior studies dating back to 2016 compatible with benign nodules. Cardiomegaly.  Prior CABG. Aortic Atherosclerosis (ICD10-I70.0). Electronically Signed   By: Charlett Nose M.D.   On: 11/23/2017 17:28   Mr Brain Wo Contrast  Result Date: 11/20/2017 CLINICAL DATA:  Acute onset aphasia EXAM: MRI HEAD WITHOUT CONTRAST TECHNIQUE:  Multiplanar, multiecho pulse sequences of the brain and surrounding structures were obtained without intravenous contrast. COMPARISON:  Head CT 11/19/2017 FINDINGS: Brain: The midline structures are normal. There is no acute infarct or acute hemorrhage. No mass lesion, hydrocephalus, dural abnormality or extra-axial collection. Old right corona radiata lacunar infarct. There is multifocal periventricular leukoaraiosis, most commonly seen in the setting of chronic ischemic microangiopathy. No age-advanced or lobar predominant atrophy. No chronic microhemorrhage or superficial siderosis. Vascular: There is loss of the normal left vertebral artery flow void. The other major intracranial flow voids of the skullbase are preserved. Skull and upper cervical spine: The visualized skull base, calvarium, upper cervical spine and extracranial soft tissues are normal. Sinuses/Orbits: Small right mastoid effusion. Paranasal sinuses are clear. Normal orbits. IMPRESSION: 1. No acute intracranial abnormality. 2. Chronic microvascular ischemia and old right corona radiata lacunar infarct. 3. Loss of the normal left vertebral artery flow void, consistent with the severely diminutive left vertebral artery demonstrated on the earlier CTA. Electronically Signed   By: Deatra Robinson M.D.   On: 11/20/2017 04:16   Mr Laqueta Jean ZO Contrast  Result Date: 11/20/2017 CLINICAL DATA:  Initial evaluation for follow-up stroke, status post tPA. EXAM: MRI HEAD WITHOUT AND WITH CONTRAST TECHNIQUE: Multiplanar, multiecho pulse sequences of the brain and surrounding structures were obtained without and with intravenous contrast. CONTRAST:  10mL MULTIHANCE GADOBENATE DIMEGLUMINE 529 MG/ML IV SOLN COMPARISON:  Prior CT from 11/19/2017 as well as previous MRI from earlier the same day. FINDINGS: Brain: Generalized age related cerebral atrophy. Patchy confluent T2/FLAIR hyperintensity within the periventricular white matter, most consistent with chronic  small vascular disease. Remote lacunar infarct again noted within the anterior right corona radiata. No abnormal foci of restricted diffusion to suggest acute or subacute ischemia. Gray-white matter differentiation maintained. No evidence for acute intracranial hemorrhage status post tPA administration. No mass lesion, midline shift or mass effect. Ventricles stable in size without hydrocephalus. No extra-axial fluid collection. Major dural sinuses are grossly patent. No abnormal enhancement. Apparent irregular enhancement within the right cerebellar hemisphere on axial post-contrast imaging felt to be most consistent with artifact, and is not seen on corresponding coronal postcontrast sequence. No imaging findings to suggest acute CNS infection. Pituitary and suprasellar region grossly normal. Vascular: Hypoplastic left vertebral artery again noted. Major intravascular flow voids otherwise maintained. Skull and upper cervical spine: Craniocervical junction grossly normal. Bone marrow signal intensity within normal limits. No appreciable scalp soft tissue abnormality. Sinuses/Orbits: Globes and oval soft tissues within normal limits. Patient status post lens extraction bilaterally. Scattered mucosal thickening noted within the ethmoidal air cells and maxillary sinuses. Paranasal sinuses are otherwise clear. No air-fluid level to suggest acute sinusitis. Right mastoid effusion noted, stable. Inner ear structures normal. Other: None. IMPRESSION: 1. No acute intracranial abnormality identified. No evidence for acute infarct or hemorrhage status post tPA administration. No imaging findings to suggest  acute CNS infection. 2. Stable atrophy with chronic small vessel ischemic disease with old right corona radiata lacunar infarct. 3. Right mastoid effusion. Electronically Signed   By: Rise Mu M.D.   On: 11/20/2017 18:36   Mr Cervical Spine W Wo Contrast  Result Date: 11/22/2017 CLINICAL DATA:  LP  concerning for viral meningitis. Patient nonverbal, and not following commands. RIGHT-sided weakness. EXAM: MRI CERVICAL SPINE WITHOUT AND WITH CONTRAST TECHNIQUE: Multiplanar and multiecho pulse sequences of the cervical spine, to include the craniocervical junction and cervicothoracic junction, were obtained without and with intravenous contrast. CONTRAST:  10mL MULTIHANCE GADOBENATE DIMEGLUMINE 529 MG/ML IV SOLN COMPARISON:  MRI brain 11/20/2017. FINDINGS: Alignment: Physiologic. Vertebrae: No fracture, evidence of discitis, or bone lesion. Cord: Normal signal and morphology. Posterior Fossa, vertebral arteries, paraspinal tissues: Negative. Disc levels: C2-3:  Normal. C3-4:  Normal.  Facet arthropathy. C4-5:  Normal.  Facet arthropathy. C5-6: Shallow central protrusion. Mild stenosis. Minimal cord flattening. No definite foraminal narrowing. C6-7: Disc space narrowing. Mild stenosis. Minimal cord flattening. RIGHT-sided foraminal narrowing due to uncinate spurring. C7-T1:  Unremarkable. Post infusion imaging does not demonstrate enhancement of the vertebral bodies, or intraspinal contents. No paravertebral focus of infection is evident. IMPRESSION: No concerning features of meningeal enhancement or cord hyperintensity. Ordinary spondylosis, without features of discitis. See discussion above. Electronically Signed   By: Elsie Stain M.D.   On: 11/22/2017 19:06   Ct Cerebral Perfusion W Contrast  Result Date: 11/19/2017 CLINICAL DATA:  Acute onset dizziness and aphasia at 1400 hours. RIGHT-sided weakness. Assess stroke. EXAM: CT ANGIOGRAPHY HEAD AND NECK CT PERFUSION BRAIN TECHNIQUE: Multidetector CT imaging of the head and neck was performed using the standard protocol during bolus administration of intravenous contrast. Multiplanar CT image reconstructions and MIPs were obtained to evaluate the vascular anatomy. Carotid stenosis measurements (when applicable) are obtained utilizing NASCET criteria, using  the distal internal carotid diameter as the denominator. Multiphase CT imaging of the brain was performed following IV bolus contrast injection. Subsequent parametric perfusion maps were calculated using RAPID software. CONTRAST:  ISOVUE-370 IOPAMIDOL (ISOVUE-370) INJECTION 76% COMPARISON:  CT HEAD November 19, 2017 at 1627 hours FINDINGS: CT HEAD FINDINGS BRAIN: No intraparenchymal hemorrhage, mass effect nor midline shift. The ventricles and sulci are normal for age. Patchy supratentorial white matter hypodensities within normal range for patient's age, though non-specific are most compatible with chronic small vessel ischemic disease. Punctate LEFT frontal lobe calcification. Old RIGHT basal ganglia lacunar infarct. No acute large vascular territory infarcts. No abnormal extra-axial fluid collections. Basal cisterns are patent. VASCULAR: Moderate calcific atherosclerosis of the carotid siphons in RIGHT vertebral artery. SKULL: No skull fracture. No significant scalp soft tissue swelling. SINUSES/ORBITS: Mild lobulated paranasal sinus mucosal thickening without air-fluid levels. Mastoid air cells are well aerated.The included ocular globes and orbital contents are non-suspicious. Status post bilateral ocular lens implants. OTHER: None. CTA NECK AORTIC ARCH: Normal appearance of the thoracic arch, normal branch pattern. Moderate calcific atherosclerosis aortic arch. The origins of the innominate, left Common carotid artery and subclavian artery are widely patent. RIGHT CAROTID SYSTEM: Common carotid artery is widely patent, mild calcific atherosclerosis. Moderate calcific atherosclerosis carotid bifurcation without hemodynamically significant stenosis by NASCET criteria. Normal appearance of the internal carotid artery. LEFT CAROTID SYSTEM: Common carotid artery is widely patent, mild calcific atherosclerosis. Moderate calcific atherosclerosis without hemodynamically significant stenosis by NASCET criteria.  Focal luminal irregularity LEFT internal carotid artery origin, favoring sequelae of atherosclerosis, less likely pseudo aneurysm. VERTEBRAL ARTERIES:RIGHT vertebral artery  is dominant. Calcific atherosclerosis resulting in severe stenosis RIGHT vertebral artery. The severe stenosis versus occluded LEFT P1 origin with immediate reconstitution, at diminutive thready vessel. SKELETON: No acute osseous process though bone windows have not been submitted. Patient is edentulous. Multilevel moderate to severe RIGHT cervical facet arthropathy. OTHER NECK: Soft tissues of the neck are nonacute though, not tailored for evaluation. UPPER CHEST: Tiny scattered centrilobular ground-glass nodules most compatible with respiratory bronchiolitis. Centrilobular emphysema. Multiple LEFT upper lobe pulmonary nodules including 10 mm spiculated nodule (series 15, image [id number]). LEFT upper lobe calcified granuloma. No superior mediastinal lymphadenopathy. OTHER: None. CTA HEAD ANTERIOR CIRCULATION: Patent cervical internal carotid arteries, petrous, cavernous and supra clinoid internal carotid arteries. Atherosclerosis resulting in mild stenosis RIGHT supraclinoid internal carotid artery. Patent anterior communicating artery. Patent anterior and middle cerebral arteries. No large vessel occlusion, significant stenosis, contrast extravasation or aneurysm. POSTERIOR CIRCULATION: Patent RIGHT vertebral artery, vertebrobasilar junction and basilar artery, as well as main branch vessels. Extremely diminutive LEFT vertebral artery, with tandem severe stenosis versus occlusion and reconstitution. Bilateral posterior inferior cerebellar arteries are patent. Small LEFT P1 segment, robust LEFT posterior communicating artery. Patent posterior cerebral arteries. No large vessel occlusion, significant stenosis, contrast extravasation or aneurysm. VENOUS SINUSES: Major dural venous sinuses are patent though not tailored for evaluation on this  angiographic examination. ANATOMIC VARIANTS: None. DELAYED PHASE: Not performed. MIP images reviewed. CT Brain Perfusion Findings: CBF (<30%) Volume: 0mL Perfusion (Tmax>6.0s) volume: 9mL Mismatch Volume: 9mL Infarction Location:LEFT posterior parietal lobe. Generalized prolonged T-max LEFT MCA and posterior watershed territory with preserved flow, potentially from ICA stenosis though, preserved LEFT ACA profusion. IMPRESSION: CT HEAD: 1. No acute intracranial process. 2. Old RIGHT basal ganglia lacunar infarct, otherwise negative noncontrast CT HEAD for age. CTA NECK: 1. Atherosclerosis without hemodynamically significant stenosis. 2. Severe stenosis versus occluded LEFT vertebral artery origin with immediate reconstitution, diminutive LEFT vertebral artery. 3. **An incidental finding of potential clinical significance has been found. Multiple LEFT upper lobe pulmonary nodules including 10 mm spiculated nodule. Recommend contrast-enhanced CT chest on a nonemergent basis to assess for extent of involvement. ** CTA HEAD: 1. No emergent large vessel occlusion or severe stenosis anterior circulation. 2. Severe stenosis versus tandem occlusion diminutive LEFT vertebral artery. RIGHT vertebral artery is dominant. CT PERFUSION: 1. Small perfusion defect LEFT parietal lobe/posterior watershed territory suggesting brain at risk. Acute findings discussed with and reconfirmed by Dr.Lindzen, Neurology on 11/19/2017 at 6:35 pm. Aortic Atherosclerosis (ICD10-I70.0) and Emphysema (ICD10-J43.9). Electronically Signed   By: Awilda Metro M.D.   On: 11/19/2017 18:36   Dg Chest Port 1 View  Result Date: 11/21/2017 CLINICAL DATA:  Coughing. EXAM: PORTABLE CHEST 1 VIEW COMPARISON:  11/20/2017 FINDINGS: Stable changes from prior cardiac surgery. Cardiac silhouette is mildly enlarged. No mediastinal or hilar masses. Opacities noted at the medial left lung base similar to the prior exam. This may reflect atelectasis or pneumonia.  Mild chronic scarring in the left upper lobe. Remainder of the lungs is clear. No pleural effusion or pneumothorax. IMPRESSION: 1. No significant change from the previous day's study. 2. Left medial lung base opacity is again noted consistent with pneumonia or atelectasis. Electronically Signed   By: Amie Portland M.D.   On: 11/21/2017 09:30   Dg Chest Port 1 View  Result Date: 11/20/2017 CLINICAL DATA:  Fever. EXAM: PORTABLE CHEST 1 VIEW COMPARISON:  CT scan of the chest dated 09/22/2017 and chest x-ray dated 03/06/2015 FINDINGS: There is a small area of increased density at  the left lung base medially, new since the prior exam. There was an area of slight atelectasis at that site on the prior CT scan. Calcified granulomas in the left lung apex, unchanged. The lungs are otherwise clear. Heart size and pulmonary vascularity are normal. CABG. No acute bone abnormality. IMPRESSION: Small area of infiltrate or increased atelectasis in the left lung base, new since the prior CT scan. Electronically Signed   By: Francene Boyers M.D.   On: 11/20/2017 15:17   Ct Head Code Stroke Wo Contrast  Result Date: 11/19/2017 CLINICAL DATA:  Code stroke.  Sudden onset dizziness and aphasia. EXAM: CT HEAD WITHOUT CONTRAST TECHNIQUE: Contiguous axial images were obtained from the base of the skull through the vertex without intravenous contrast. COMPARISON:  None FINDINGS: Brain: A 2 mm punctate density in the left frontal white matter is favored to reflect a calcification. No definite intracranial hemorrhage is identified. There is a chronic lacunar infarct in the right lentiform nucleus. No acute cortically based infarct, mass, midline shift, or extra-axial fluid collection is identified. Periventricular white matter hypodensities are nonspecific but compatible with mild chronic small vessel ischemic disease. Generalized cerebral atrophy is relatively mild for age. Vascular: Calcified atherosclerosis at the skullbase. No  hyperdense vessel. Skull: No fracture or focal osseous lesion. Sinuses/Orbits: Mild bilateral ethmoid air cell mucosal thickening. Clear mastoid air cells. No acute findings in the included orbits. Other: None. ASPECTS [facility] Stroke Program Early CT Score) - Ganglionic level infarction (caudate, lentiform nuclei, internal capsule, insula, M1-M3 cortex): 7 - Supraganglionic infarction (M4-M6 cortex): 3 Total score (0-10 with 10 being normal): 10 IMPRESSION: 1. No evidence of acute intracranial abnormality. 2. ASPECTS is 10. 3. Mild chronic small vessel ischemic disease and cerebral atrophy. Chronic right basal ganglia lacunar infarct. These results were called by telephone at the time of interpretation on 11/19/2017 at 4:40 pm to Dr. Sharman Cheek , who verbally acknowledged these results. Electronically Signed   By: Sebastian Ache M.D.   On: 11/19/2017 16:40    Lab Data:  CBC: Recent Labs  Lab 11/19/17 1620 11/20/17 0728 11/21/17 0540 11/22/17 0514 11/23/17 0354 11/24/17 0307  WBC 6.5 8.3 8.3 8.0 8.1 7.5  NEUTROABS 4.8  --   --   --   --  5.6  HGB 11.7* 11.6* 11.5* 10.9* 10.8* 10.1*  HCT 35.6* 34.9* 34.3* 32.6* 32.0* 29.8*  MCV 94.3 92.1 91.5 91.8 92.0 92.0  PLT 240 232 212 212 203 180   Basic Metabolic Panel: Recent Labs  Lab 11/20/17 0728 11/21/17 0540 11/22/17 0514 11/23/17 0354 11/24/17 0307  NA 138 137 138 140 144  K 4.3 3.9 3.9 3.4* 3.3*  CL 104 103 107 109 114*  CO2 21* 23 22 24 24   GLUCOSE 90 104* 97 195* 158*  BUN 11 13 15 18 17   CREATININE 1.12 1.23 1.17 1.06 0.90  CALCIUM 9.2 8.8* 8.3* 8.2* 8.2*   GFR: Estimated Creatinine Clearance: 52.6 mL/min (by C-G formula based on SCr of 0.9 mg/dL). Liver Function Tests: Recent Labs  Lab 11/19/17 1620  AST 24  ALT 17  ALKPHOS 65  BILITOT 0.7  PROT 6.7  ALBUMIN 4.2   No results for input(s): LIPASE, AMYLASE in the last 168 hours. Recent Labs  Lab 11/20/17 1113  AMMONIA 13   Coagulation Profile: Recent Labs    Lab 11/19/17 1620  INR 1.04   Cardiac Enzymes: Recent Labs  Lab 11/19/17 1620 11/22/17 0514  TROPONINI 0.05* 0.12*   BNP (last  3 results) No results for input(s): PROBNP in the last 8760 hours. HbA1C: No results for input(s): HGBA1C in the last 72 hours. CBG: Recent Labs  Lab 11/24/17 1620 11/24/17 2055 11/25/17 0004 11/25/17 0510 11/25/17 0815  GLUCAP 182* 189* 170* 191* 189*   Lipid Profile: No results for input(s): CHOL, HDL, LDLCALC, TRIG, CHOLHDL, LDLDIRECT in the last 72 hours. Thyroid Function Tests: No results for input(s): TSH, T4TOTAL, FREET4, T3FREE, THYROIDAB in the last 72 hours. Anemia Panel: No results for input(s): VITAMINB12, FOLATE, FERRITIN, TIBC, IRON, RETICCTPCT in the last 72 hours. Urine analysis:    Component Value Date/Time   COLORURINE YELLOW 11/23/2017 1435   APPEARANCEUR CLEAR 11/23/2017 1435   LABSPEC 1.025 11/23/2017 1435   PHURINE 5.0 11/23/2017 1435   GLUCOSEU 50 (A) 11/23/2017 1435   HGBUR NEGATIVE 11/23/2017 1435   BILIRUBINUR NEGATIVE 11/23/2017 1435   KETONESUR 5 (A) 11/23/2017 1435   PROTEINUR 100 (A) 11/23/2017 1435   NITRITE NEGATIVE 11/23/2017 1435   LEUKOCYTESUR NEGATIVE 11/23/2017 1435     Peter Becker M.D. [facility] 11/25/2017, 12:11 PM  Pager: [phone] Between 7am to 7pm - call Pager - [phone]  After 7pm go to [email or web address] - password [email or web address]  Call night coverage person covering after 7pm

## 2017-11-26 ENCOUNTER — Encounter (HOSPITAL_COMMUNITY): Payer: Self-pay

## 2017-11-26 ENCOUNTER — Other Ambulatory Visit: Payer: Self-pay

## 2017-11-26 DIAGNOSIS — R531 Weakness: Secondary | ICD-10-CM

## 2017-11-26 DIAGNOSIS — J690 Pneumonitis due to inhalation of food and vomit: Secondary | ICD-10-CM

## 2017-11-26 LAB — GLUCOSE, CAPILLARY
GLUCOSE-CAPILLARY: 159 mg/dL — AB (ref 65–99)
GLUCOSE-CAPILLARY: 145 mg/dL — AB (ref 65–99)
GLUCOSE-CAPILLARY: 172 mg/dL — AB (ref 65–99)
GLUCOSE-CAPILLARY: 185 mg/dL — AB (ref 65–99)
Glucose-Capillary: 235 mg/dL — ABNORMAL HIGH (ref 65–99)

## 2017-11-26 LAB — VALPROIC ACID LEVEL: VALPROIC ACID LVL: 70 ug/mL (ref 50.0–100.0)

## 2017-11-26 MED ORDER — DEXTROSE 5 % IV SOLN
1.0000 g | Freq: Two times a day (BID) | INTRAVENOUS | Status: DC
  Administered 2017-11-26: 1 g via INTRAVENOUS
  Filled 2017-11-26: qty 1

## 2017-11-26 MED ORDER — IPRATROPIUM-ALBUTEROL 0.5-2.5 (3) MG/3ML IN SOLN: 3.0000 mL | RESPIRATORY_TRACT | Status: DC | PRN

## 2017-11-26 MED ORDER — GLYCOPYRROLATE 0.2 MG/ML IJ SOLN
0.4000 mg | Freq: Three times a day (TID) | INTRAMUSCULAR | Status: DC
  Administered 2017-11-26 – 2017-11-27 (×3): 0.4 mg via INTRAVENOUS
  Filled 2017-11-26 (×3): qty 2

## 2017-11-26 MED ORDER — LORAZEPAM 2 MG/ML IJ SOLN
1.0000 mg | INTRAMUSCULAR | Status: DC | PRN
  Administered 2017-11-26: 1 mg via INTRAVENOUS
  Filled 2017-11-26: qty 1

## 2017-11-26 MED ORDER — MORPHINE SULFATE (PF) 2 MG/ML IV SOLN: 2.0000 mg | INTRAVENOUS | Status: DC | PRN

## 2017-11-26 MED ORDER — IPRATROPIUM-ALBUTEROL 0.5-2.5 (3) MG/3ML IN SOLN
3.0000 mL | Freq: Four times a day (QID) | RESPIRATORY_TRACT | Status: DC
  Administered 2017-11-26: 3 mL via RESPIRATORY_TRACT
  Filled 2017-11-26: qty 3

## 2017-11-26 NOTE — Progress Notes (Signed)
STROKE TEAM PROGRESS NOTE   SUBJECTIVE (INTERVAL HISTORY) His wife isat the bedside. Pt still nonverbal but following   all commands. No events overnight.   He  developed increased secretions and respiratory distress overnight but was given breathing treatment and now appears to be improving.  OBJECTIVE Temp:  [98.6 F (37 C)-99.7 F (37.6 C)] 98.6 F (37 C) (12/26 1523) Pulse Rate:  [72-87] 72 (12/26 1523) Cardiac Rhythm: Normal sinus rhythm (12/26 1015) Resp:  [19] 19 (12/25 2055) BP: (159-177)/(68-81) 159/68 (12/26 1523) SpO2:  [89 %-96 %] 93 % (12/26 1523) FiO2 (%):  [4 %] 4 % (12/26 1523) Weight:  [137 lb 5.6 oz (62.3 kg)] 137 lb 5.6 oz (62.3 kg) (12/26 0500)  Recent Labs  Lab 11/25/17 2054 11/26/17 0130 11/26/17 0422 11/26/17 0736 11/26/17 1141  GLUCAP 144* 145* 172* 185* 235*   Recent Labs  Lab 11/20/17 0728 11/21/17 0540 11/22/17 0514 11/23/17 0354 11/24/17 0307  NA 138 137 138 140 144  K 4.3 3.9 3.9 3.4* 3.3*  CL 104 103 107 109 114*  CO2 21* 23 22 24 24   GLUCOSE 90 104* 97 195* 158*  BUN 11 13 15 18 17   CREATININE 1.12 1.23 1.17 1.06 0.90  CALCIUM 9.2 8.8* 8.3* 8.2* 8.2*   Recent Labs  Lab 11/19/17 1620  AST 24  ALT 17  ALKPHOS 65  BILITOT 0.7  PROT 6.7  ALBUMIN 4.2   Recent Labs  Lab 11/19/17 1620 11/20/17 0728 11/21/17 0540 11/22/17 0514 11/23/17 0354 11/24/17 0307  WBC 6.5 8.3 8.3 8.0 8.1 7.5  NEUTROABS 4.8  --   --   --   --  5.6  HGB 11.7* 11.6* 11.5* 10.9* 10.8* 10.1*  HCT 35.6* 34.9* 34.3* 32.6* 32.0* 29.8*  MCV 94.3 92.1 91.5 91.8 92.0 92.0  PLT 240 232 212 212 203 180   Recent Labs  Lab 11/19/17 1620 11/22/17 0514  TROPONINI 0.05* 0.12*   No results for input(s): LABPROT, INR in the last 72 hours. No results for input(s): COLORURINE, LABSPEC, PHURINE, GLUCOSEU, HGBUR, BILIRUBINUR, KETONESUR, PROTEINUR, UROBILINOGEN, NITRITE, LEUKOCYTESUR in the last 72 hours.  Invalid input(s): APPERANCEUR     Component Value Date/Time    CHOL 129 11/20/2017 0559   TRIG 56 11/20/2017 0559   HDL 71 11/20/2017 0559   CHOLHDL 1.8 11/20/2017 0559   VLDL 11 11/20/2017 0559   LDLCALC 47 11/20/2017 0559   Lab Results  Component Value Date   HGBA1C 7.3 (H) 11/20/2017      Component Value Date/Time   LABOPIA NONE DETECTED 11/20/2017 1055   COCAINSCRNUR NONE DETECTED 11/20/2017 1055   LABBENZ NONE DETECTED 11/20/2017 1055   AMPHETMU NONE DETECTED 11/20/2017 1055   THCU NONE DETECTED 11/20/2017 1055   LABBARB NONE DETECTED 11/20/2017 1055    No results for input(s): ETH in the last 168 hours.   IMAGING  I have personally reviewed the radiological images below and agree with the radiology interpretations.   MR Cervical Spine With and Without Contrast - pending   Ct Angio Head and neck and CTP W Or Wo Contrast 11/19/2017 IMPRESSION:  CT HEAD:  1. No acute intracranial process.  2. Old RIGHT basal ganglia lacunar infarct, otherwise negative noncontrast CT HEAD for age.   CTA NECK:  1. Atherosclerosis without hemodynamically significant stenosis.  2. Severe stenosis versus occluded LEFT vertebral artery origin with immediate reconstitution, diminutive LEFT vertebral artery.  3. **An incidental finding of potential clinical significance has been found.  Multiple  LEFT upper lobe pulmonary nodules including 10 mm spiculated nodule. Recommend contrast-enhanced CT chest on a nonemergent basis to assess for extent of involvement. **    CTA HEAD:  1. No emergent large vessel occlusion or severe stenosis anterior circulation.  2. Severe stenosis versus tandem occlusion diminutive LEFT vertebral artery.   RIGHT vertebral artery is dominant.    CT PERFUSION:  1. Small perfusion defect LEFT parietal lobe/posterior watershed territory suggesting brain at risk.  Aortic Atherosclerosis (ICD10-I70.0) and Emphysema (ICD10-J43.9).   Mr Brain Wo Contrast 11/20/2017 IMPRESSION:  1. No acute intracranial abnormality.  2.  Chronic microvascular ischemia and old right corona radiata lacunar infarct.  3. Loss of the normal left vertebral artery flow void, consistent with the severely diminutive left vertebral artery demonstrated on the earlier CTA.     Ct Head Code Stroke Wo Contrast 11/19/2017 IMPRESSION:  1. No evidence of acute intracranial abnormality.  2. ASPECTS is 10.  3. Mild chronic small vessel ischemic disease and cerebral atrophy.   Chronic right basal ganglia lacunar infarct.    EEG: 11/20/2017 This EEG is abnormal due to diffuse slowing of the waking background. Clinical Correlation of the above findings indicates diffuse cerebral dysfunction that is non-specific in etiology and can be seen with hypoxic/ischemic injury, toxic/metabolic encephalopathies, neurodegenerative disorders, or medication effect.  However, findings may also be due to excessive drowsiness.  Clinical correlation advised.   EEG: 11/21/2017 Impression: This awake and asleep EEG is abnormal due to occasional focal slowing over the left hemisphere, maximal over the left frontocentrotemporal region.  Clinical Correlation of the above findings indicates focal cerebral dysfunction over the left hemisphere region suggestive of underlying structural or physiologic abnormality. The absence of epileptiform discharges does not exclude a clinical diagnosis of epilepsy. Clinical correlation is advised.   Mr Laqueta JeanBrain W Wo Contrast 11/20/2017 IMPRESSION:  1. No acute intracranial abnormality identified. No evidence for acute infarct or hemorrhage status post tPA administration. No imaging findings to suggest acute CNS infection.  2. Stable atrophy with chronic small vessel ischemic disease with old right corona radiata lacunar infarct.  3. Right mastoid effusion.    Dg Chest Port 1 View 11/20/2017 IMPRESSION:  Small area of infiltrate or increased atelectasis in the left lung base, new since the prior CT scan.    Dg Chest Port  1 View 11/21/2017 IMPRESSION: 1. No significant change from the previous day's study. 2. Left medial lung base opacity is again noted consistent with pneumonia or atelectasis.    TTE  - Left ventricle: The cavity size was normal. Systolic function was   normal. The estimated ejection fraction was in the range of 50%   to 55%. Hypokinesis of the apical myocardium. Doppler parameters   are consistent with abnormal left ventricular relaxation (grade 1   diastolic dysfunction). Doppler parameters are consistent with   high ventricular filling pressure. - Aortic valve: Transvalvular velocity was within the normal range.   There was no stenosis. There was no regurgitation. Valve area   (VTI): 2.8 cm^2. Valve area (Vmax): 2.67 cm^2. Valve area   (Vmean): 2.38 cm^2. - Mitral valve: Transvalvular velocity was within the normal range.   There was no evidence for stenosis. There was mild regurgitation. - Left atrium: The atrium was moderately dilated. - Right ventricle: The cavity size was normal. Wall thickness was   normal. Systolic function was normal. - Right atrium: The atrium was mildly dilated. - Atrial septum: No defect or patent foramen ovale was identified. -  Tricuspid valve: There was no regurgitation.  MRI Cervical spine : No concerning features of meningeal enhancement or cord hyperintensity.Ordinary spondylosis, without features of discitis  PHYSICAL EXAM Vitals:   11/26/17 0600 11/26/17 0810 11/26/17 1007 11/26/17 1523  BP:   (!) 177/81 (!) 159/68  Pulse:   87 72  Resp:      Temp:    98.6 F (37 C)  TempSrc:    Oral  SpO2: 96% 94%  93%  Weight:      Height:        General - Well nourished, well developed, still obtunded and not following commands.  Ophthalmologic - fundi not visualized due to noncooperation.  Cardiovascular - Regular rate and rhythm.  Neuro -awake alert and now consistently following commands.  . Nonverbal, but attempts to phonate with  incomprehensible speech not able to name or repeat,    . Inconsistently blinking to visual threat bilaterally. Eyes can follow movement bilaterally as commanded, doll's eye present. PREEL. Positive corneal and gag. LUE and LLE move spontaneously and against gravity, RUE 2/5 and RLE 3/5 on pain. DTR 1+ and not cooperative on babinski. Sensation, coordination and gait not tested.   ASSESSMENT/PLAN Mr. Calvin Jablonowski is a 83 y.o. male with history of DM, HTN, MI s/p CABG, PVD, and HLD on lovastatin admitted for AMS, confusion, aphasia, wax and waning process. TPA given at OSH.    AMS with intermittent fever - Seizure with Todd paralysis vs. CNS infection -meningoencephalitis presentation concerning for seizure like activity and CSF concerning for viral meningitis    Resultant AMS, nonverbal, agitation, left hemiparesis  MRI  No acute stroke  MRI brain repeat with and without contrast negative  CTA head and neck b/l ICA proximal and right VA origin athero, left VA hypoplastic  2D Echo EF 50-55%  EEG - diffuse slowing no seizure  TSH/B12 and ammonia WNL  LP showed elevated RBC, WBC and protein, concerning for viral meningitis. ID consulted  Blood culture - no growth day 1  Repeat EEG - see above  LDL 47  HgbA1c 7.3  SCDs for VTE prophylaxis  No diet orders on file   aspirin 81 mg daily prior to admission, now on No antithrombotic  Therapy recommendations:  pending  Disposition:  Pending  ? pneumonia   CXR showed new small area of infiltration left lung base  Repeat CXR - no significant change - possible pneumonia - not currently on antibiotics.  Has cough with secretion  Intermittent fever  Diabetes  HgbA1c 7.3 goal < 7.0  Uncontrolled  Home meds -glyburide  CBG monitoring  SSI  Hypertension Stable  Long term BP goal normotensive  Hyperlipidemia  Home meds:  lovastatin   LDL 47, goal < 70  Resume once po access  Continue statin at  discharge  Other Stroke Risk Factors  Advanced age  CAD/MI s/p CABG  PVD   Seizure Activity  Now on Depacon - 500 mg IV every 8 hours - level optimal at 71  EEGs 2 - abnormal as noted above   Other Active Problems  Tmax - 100.9 -> 98.2 Saturday  Multiple LEFT upper lobe pulmonary nodules including 10 mm spiculated nodule. Contrast-enhanced CT chest on a nonemergent basis to assess for extent of involvement recommended.  Possible pneumonia - consider antibiotics will call CCM/medical hospitalist team to consult and help manage  Tube feeds started, nutrition consulted  Hospital day # 7  The patient is likely to gradually improve over the next  few days but will need antibiotics for his pneumonia and consolidation. Continue tube feeds and speech therapy to follow to decide on PEG tube versus oral feeding. Long discussion with the patient's wife at the bedside and answered questions about his care. She would prefer full ongoing medical support and even transfer to skilled nursing facility or a PEG tube if necessary. Discussed with Dr. Isidoro Donning and plan to transfer patient out of ICU today and transfer her to medical hospitalist service for further management. Stroke team will continue to follow. Greater than 50% time during this 35 minute visit was spent on counseling and coordination of care about his meningoencephalitis, pneumonia and answering questions.  Delia Heady, MD Medical Director [facility] Pager: [phone] 11/26/2017 3:50 PM   To contact Stroke Continuity provider, please refer to [email or web address]. After hours, contact General Neurology STROKE TEAM PROGRESS NOTE   SUBJECTIVE (INTERVAL HISTORY) His family is not at the bedside. Pt still nonverbal butt following very few commands. No events overnight.   MRI cervical spine. Shows no compression or significant abnormality  Intermittent cough with secretions. Still has right hemiparesis. LP concerning for viral  meningitis. ID following.    OBJECTIVE Temp:  [98.6 F (37 C)-99.7 F (37.6 C)] 98.6 F (37 C) (12/26 1523) Pulse Rate:  [72-87] 72 (12/26 1523) Cardiac Rhythm: Normal sinus rhythm (12/26 1015) Resp:  [19] 19 (12/25 2055) BP: (159-177)/(68-81) 159/68 (12/26 1523) SpO2:  [89 %-96 %] 93 % (12/26 1523) FiO2 (%):  [4 %] 4 % (12/26 1523) Weight:  [137 lb 5.6 oz (62.3 kg)] 137 lb 5.6 oz (62.3 kg) (12/26 0500)  Recent Labs  Lab 11/25/17 2054 11/26/17 0130 11/26/17 0422 11/26/17 0736 11/26/17 1141  GLUCAP 144* 145* 172* 185* 235*   Recent Labs  Lab 11/20/17 0728 11/21/17 0540 11/22/17 0514 11/23/17 0354 11/24/17 0307  NA 138 137 138 140 144  K 4.3 3.9 3.9 3.4* 3.3*  CL 104 103 107 109 114*  CO2 21* 23 22 24 24   GLUCOSE 90 104* 97 195* 158*  BUN 11 13 15 18 17   CREATININE 1.12 1.23 1.17 1.06 0.90  CALCIUM 9.2 8.8* 8.3* 8.2* 8.2*   Recent Labs  Lab 11/19/17 1620  AST 24  ALT 17  ALKPHOS 65  BILITOT 0.7  PROT 6.7  ALBUMIN 4.2   Recent Labs  Lab 11/19/17 1620 11/20/17 0728 11/21/17 0540 11/22/17 0514 11/23/17 0354 11/24/17 0307  WBC 6.5 8.3 8.3 8.0 8.1 7.5  NEUTROABS 4.8  --   --   --   --  5.6  HGB 11.7* 11.6* 11.5* 10.9* 10.8* 10.1*  HCT 35.6* 34.9* 34.3* 32.6* 32.0* 29.8*  MCV 94.3 92.1 91.5 91.8 92.0 92.0  PLT 240 232 212 212 203 180   Recent Labs  Lab 11/19/17 1620 11/22/17 0514  TROPONINI 0.05* 0.12*   No results for input(s): LABPROT, INR in the last 72 hours. No results for input(s): COLORURINE, LABSPEC, PHURINE, GLUCOSEU, HGBUR, BILIRUBINUR, KETONESUR, PROTEINUR, UROBILINOGEN, NITRITE, LEUKOCYTESUR in the last 72 hours.  Invalid input(s): APPERANCEUR     Component Value Date/Time   CHOL 129 11/20/2017 0559   TRIG 56 11/20/2017 0559   HDL 71 11/20/2017 0559   CHOLHDL 1.8 11/20/2017 0559   VLDL 11 11/20/2017 0559   LDLCALC 47 11/20/2017 0559   Lab Results  Component Value Date   HGBA1C 7.3 (H) 11/20/2017      Component Value  Date/Time   LABOPIA NONE DETECTED 11/20/2017 1055   COCAINSCRNUR  NONE DETECTED 11/20/2017 1055   LABBENZ NONE DETECTED 11/20/2017 1055   AMPHETMU NONE DETECTED 11/20/2017 1055   THCU NONE DETECTED 11/20/2017 1055   LABBARB NONE DETECTED 11/20/2017 1055    No results for input(s): ETH in the last 168 hours.   IMAGING  I have personally reviewed the radiological images below and agree with the radiology interpretations.   MR Cervical Spine With and Without Contrast - pending   Ct Angio Head and neck and CTP W Or Wo Contrast 11/19/2017 IMPRESSION:  CT HEAD:  1. No acute intracranial process.  2. Old RIGHT basal ganglia lacunar infarct, otherwise negative noncontrast CT HEAD for age.   CTA NECK:  1. Atherosclerosis without hemodynamically significant stenosis.  2. Severe stenosis versus occluded LEFT vertebral artery origin with immediate reconstitution, diminutive LEFT vertebral artery.  3. **An incidental finding of potential clinical significance has been found.  Multiple LEFT upper lobe pulmonary nodules including 10 mm spiculated nodule. Recommend contrast-enhanced CT chest on a nonemergent basis to assess for extent of involvement. **    CTA HEAD:  1. No emergent large vessel occlusion or severe stenosis anterior circulation.  2. Severe stenosis versus tandem occlusion diminutive LEFT vertebral artery.   RIGHT vertebral artery is dominant.    CT PERFUSION:  1. Small perfusion defect LEFT parietal lobe/posterior watershed territory suggesting brain at risk.  Aortic Atherosclerosis (ICD10-I70.0) and Emphysema (ICD10-J43.9).   Mr Brain Wo Contrast 11/20/2017 IMPRESSION:  1. No acute intracranial abnormality.  2. Chronic microvascular ischemia and old right corona radiata lacunar infarct.  3. Loss of the normal left vertebral artery flow void, consistent with the severely diminutive left vertebral artery demonstrated on the earlier CTA.     Ct Head Code Stroke Wo  Contrast 11/19/2017 IMPRESSION:  1. No evidence of acute intracranial abnormality.  2. ASPECTS is 10.  3. Mild chronic small vessel ischemic disease and cerebral atrophy.   Chronic right basal ganglia lacunar infarct.    EEG: 11/20/2017 This EEG is abnormal due to diffuse slowing of the waking background. Clinical Correlation of the above findings indicates diffuse cerebral dysfunction that is non-specific in etiology and can be seen with hypoxic/ischemic injury, toxic/metabolic encephalopathies, neurodegenerative disorders, or medication effect.  However, findings may also be due to excessive drowsiness.  Clinical correlation advised.   EEG: 11/21/2017 Impression: This awake and asleep EEG is abnormal due to occasional focal slowing over the left hemisphere, maximal over the left frontocentrotemporal region.  Clinical Correlation of the above findings indicates focal cerebral dysfunction over the left hemisphere region suggestive of underlying structural or physiologic abnormality. The absence of epileptiform discharges does not exclude a clinical diagnosis of epilepsy. Clinical correlation is advised.   [name] Contrast 11/20/2017 IMPRESSION:  1. No acute intracranial abnormality identified. No evidence for acute infarct or hemorrhage status post tPA administration. No imaging findings to suggest acute CNS infection.  2. Stable atrophy with chronic small vessel ischemic disease with old right corona radiata lacunar infarct.  3. Right mastoid effusion.    Dg Chest Port 1 View 11/20/2017 IMPRESSION:  Small area of infiltrate or increased atelectasis in the left lung base, new since the prior CT scan.    Dg Chest Port 1 View 11/21/2017 IMPRESSION: 1. No significant change from the previous day's study. 2. Left medial lung base opacity is again noted consistent with pneumonia or atelectasis.    TTE  - Left ventricle: The cavity size was normal. Systolic function  was  normal. The estimated ejection fraction was in the range of 50%   to 55%. Hypokinesis of the apical myocardium. Doppler parameters   are consistent with abnormal left ventricular relaxation (grade 1   diastolic dysfunction). Doppler parameters are consistent with   high ventricular filling pressure. - Aortic valve: Transvalvular velocity was within the normal range.   There was no stenosis. There was no regurgitation. Valve area   (VTI): 2.8 cm^2. Valve area (Vmax): 2.67 cm^2. Valve area   (Vmean): 2.38 cm^2. - Mitral valve: Transvalvular velocity was within the normal range.   There was no evidence for stenosis. There was mild regurgitation. - Left atrium: The atrium was moderately dilated. - Right ventricle: The cavity size was normal. Wall thickness was   normal. Systolic function was normal. - Right atrium: The atrium was mildly dilated. - Atrial septum: No defect or patent foramen ovale was identified. - Tricuspid valve: There was no regurgitation.  MRI Cervical spine : No concerning features of meningeal enhancement or cord hyperintensity.Ordinary spondylosis, without features of discitis  PHYSICAL EXAM Vitals:   11/26/17 0600 11/26/17 0810 11/26/17 1007 11/26/17 1523  BP:   (!) 177/81 (!) 159/68  Pulse:   87 72  Resp:      Temp:    98.6 F (37 C)  TempSrc:    Oral  SpO2: 96% 94%  93%  Weight:      Height:        General - Well nourished, well developed, still obtunded and not following commands.  Ophthalmologic - fundi not visualized due to noncooperation.  Cardiovascular - Regular rate and rhythm.  Neuro - obtunded, not following commands. Eyes closed, but able to open with voice. Nonverbal, not able to name or repeat,   following only few occasional commands. Inconsistently blinking to visual threat bilaterally. Eyes mainly natural position, not movement bilaterally as command, doll's eye present. PREEL. Positive corneal and gag. LUE and LLE move spontaneously  and against gravity, RUE 2/5 and RLE 3/5 on pain. DTR 1+ and not cooperative on babinski. Sensation, coordination and gait not tested.   ASSESSMENT/PLAN Mr. Peter FretRalph Becker is a 83 y.o. male with history of DM, HTN, MI s/p CABG, PVD, and HLD on lovastatin admitted for AMS, confusion, aphasia, wax and waning process. TPA given at OSH.    AMS with intermittent fever - Seizure with Todd paralysis vs. CNS infection -meningoencephalitis presentation concerning for seizure like activity and CSF concerning for viral meningitis    Resultant AMS, nonverbal, agitation, left hemiparesis  MRI  No acute stroke  MRI brain repeat with and without contrast negative  CTA head and neck b/l ICA proximal and right VA origin athero, left VA hypoplastic  2D Echo EF 50-55%  EEG - diffuse slowing no seizure  TSH/B12 and ammonia WNL  LP showed elevated RBC, WBC and protein, concerning for viral meningitis. ID consulted  Blood culture - no growth day 1  Repeat EEG - see above  LDL 47  HgbA1c 7.3  SCDs for VTE prophylaxis  No diet orders on file   aspirin 81 mg daily prior to admission, now on No antithrombotic  Therapy recommendations:  pending  Disposition:  Pending  ? pneumonia   CXR showed new small area of infiltration left lung base  Repeat CXR - no significant change - possible pneumonia - not currently on antibiotics.  Has cough with secretion  Intermittent fever  Diabetes  HgbA1c 7.3 goal < 7.0  Uncontrolled  Home meds -glyburide  CBG monitoring  SSI  Hypertension Stable  Long term BP goal normotensive  Hyperlipidemia  Home meds:  lovastatin   LDL 47, goal < 70  Resume once po access  Continue statin at discharge  Other Stroke Risk Factors  Advanced age  CAD/MI s/p CABG  PVD   Seizure Activity  Now on Depacon - 500 mg IV every 8 hours - level optimal at 71  EEGs 2 - abnormal as noted above   Other Active Problems  Tmax - 100.9 -> 98.2  Saturday  Multiple LEFT upper lobe pulmonary nodules including 10 mm spiculated nodule. Contrast-enhanced CT chest on a nonemergent basis to assess for extent of involvement recommended.  Possible pneumonia - consider antibiotics will call CCM/medical hospitalist team to consult and help manage  Tube feeds started, nutrition consulted  Hospital day # 7   Continue antibiotics for treatment for pneumonia and panda tube for feeding. Patient is likely going to need a PEG tube due to significant dysarthria and dysphagia swallowing function is not going to improve soon. Long discussion of the bedside with the patient's wife   as well as with palliative care team nurse practitioner  about goals of care and answered questions.Will check  paraneoplastic antibody panel given evidence of pulmonary nodules on CT chest. Discussed with Dr. Isidoro Donning. Family plan to meet later today and make a decision on goals of care Greater than 50% time during this 25 minute visit was spent on counseling and coordination of care about his neurological presentation plan for treatment and evaluation answering questions.  Delia Heady, MD Medical Director [facility] Pager: [phone] 11/26/2017 3:50 PM   To contact Stroke Continuity provider, please refer to [email or web address]. After hours, contact General Neurology

## 2017-11-26 NOTE — Progress Notes (Signed)
Patient ID: Peter FretRalph Becker, male   DOB: Sep 13, 1929, 83 y.o.   MRN: [id number]  This NP visited patient at the bedside as a follow up to  yesterday's GOCs meeting, wife at bedside. Pasotr is present .  Conference call to include both daughter/Peter Becker and son/Peter Becker for continued conversation regarding diagnosis, prognosis, GOC, disposition and options.  Family verbalize an understanding of long term poor prognosis and a "knowing" that the best thing for this patient is a shift to comfort and dignity.    No further life prolonging measures, family I hopeful for hospice facility  Focus is comfort, quality and dignity  -dc artificial feeding/ cor-track - no further diagnostics, minimize medications Robinul for secretions -Morphine for pain and dyspnea -Ativan for agitation  Natural trajectory and expectations at EOL discussed  Family is hopeful for hospice facility.  Questions and concerns addressed.  Wife plans to stay the night   Time in 1230          Time out 1315    Total time spent on the unit was 45 minutes  Greater than 50% of the time was spent in counseling and coordination of care  Peter CreedMary Barkley Kratochvil NP  Palliative Medicine Team Team Phone # [phone] Pager [phone]

## 2017-11-26 NOTE — Care Management Important Message (Signed)
Important Message  Patient Details  Name: Peter FretRalph Mapes MRN: [id number] Date of Birth: [birth year]   Medicare Important Message Given:  Yes    Geovanie Winnett 11/26/2017, 2:01 PM

## 2017-11-26 NOTE — Progress Notes (Signed)
PHARMACY NOTE:  ANTIMICROBIAL RENAL DOSAGE ADJUSTMENT  Rosana FretRalph Hildreth is a 83 y.o. male admitted on 11/19/2017 as a Code Stroke.  Pharmacy has been consulted for vancomycin dosing for PNA. Pt is afebrile and WBC is WNL. SCr 0.9 on 12/24. Current UOP is borderline at 0.5. D#4 of antibiotics. Scr trending down. Per RN patient's O2 dropped to mid-80s and lungs sound more wet.   Current antimicrobial regimen includes a mismatch between antimicrobial dosage and estimated renal function.  As per policy approved by the Pharmacy & Therapeutics and Medical Executive Committees, the antimicrobial dosage will be adjusted accordingly.  Current antimicrobial dosage:  Cefepime 1g IV q24h  Indication: Pneumonia  Renal Function:  Estimated Creatinine Clearance: 51.9 mL/min (by C-G formula based on SCr of 0.9 mg/dL).  Plan: Change cefepime to 1g IV q12h due to improved renal function Monitor clinic progress, WBC, TMax, renal function, electrolytes F/u BCx, C/S, future de-escalation, & length of therapy  Height: 5\' 7"  (170.2 cm) Weight: 137 lb 5.6 oz (62.3 kg) IBW/kg (Calculated) : 66.1  Temp (24hrs), Avg:99.1 F (37.3 C), Min:98.6 F (37 C), Max:99.7 F (37.6 C)  Recent Labs  Lab 11/20/17 0728 11/21/17 0540 11/22/17 0514 11/23/17 0354 11/24/17 0307  WBC 8.3 8.3 8.0 8.1 7.5  CREATININE 1.12 1.23 1.17 1.06 0.90    Estimated Creatinine Clearance: 51.9 mL/min (by C-G formula based on SCr of 0.9 mg/dL).    Allergies  Allergen Reactions  . Tape Other (See Comments)    SKIN IS VERY THIN AND TEARS AND BRUISES EASILY; Please use an alternative!!    Antimicrobials this admission: Vanc 12/23>>12/25 Cefepime 12/23>> Acyclovir 12/20>>12/22  Dose adjustments this admission: N/A  Microbiology results: 12/20BCx: NGTD 12/20 CSF: NGTD 12/20MRSA PCR: neg  Thank you for allowing pharmacy to be a part of this patient's care.  Donnella Biyler Laureen Frederic, PharmD PGY1 Acute Care Pharmacy Resident Pager:  [phone] 11/26/2017 10:36 AM

## 2017-11-26 NOTE — Progress Notes (Signed)
[facility] progress note                                                                               Patient Demographics  Peter Becker, is a 83 y.o. male, DOB - [birth year], ZOX:[id number]  Admit date - 11/19/2017   Admitting Physician Caryl Pina, MD  Outpatient Primary MD for the patient is Danella Penton, MD  Outpatient specialists:   LOS - 7  days   Medical records reviewed and are as summarized below:    No chief complaint on file.      Brief summary   Peter Becker is an 83 y.o. male with a past medical history of diabetes, hypertension, peripheral arterial disease, and  coronary artery disease who was admitted for CVA evaluation after onset of weakness and aphasia at 2pm on 12/19.  Pt has had continued weakness and aphasia.Mri showed no evidence of acute stroke.  Pt has been seen by Infectious disease for LP due to possible concern for meningitis.  Pt had a chest xray today that shows progressive left lower lobe pneumonia [facility] medicine service was consulted for coughing, left lower lobe pneumonia. On 12/24, Dr. Pearlean Brownie requested to [facility] to assume care from 12/25 given multiple medical problems.   Assessment & Plan    Active Problems: Acute neuro deficits with aphasia, dysphagia -Management per neurology.  TPA given at OSH  -MRI showed no acute stroke, MRI brain repeat with and without contrast also negative,  - CTA head and neck showed bilateral ICA proximal and right VA origin atherosclerosis, left would be a hypoplastic -2D echo showed EF of 50-55%, EEG showed diffuse slowing but no seizures. -LDL 47, hemoglobin A1c 7.3. -Patient on aspirin 81 mg daily PTA, started back on aspirin via tube.   Dysphagia -Currently on tube feeds, repeat swallow evaluation today -Palliative care following, requesting goals of care -Discussed in detail with patient's wife at the bedside, wishes to have PEG tube placed if patient fails swallow evaluation    Altered mental status with  intermittent fevers-seizure with Todd's paralysis versus viral meningitis -Possibly due to acute stroke, seizure, meningoencephalitis -Currently alert but nonverbal  -Patient underwent lumbar puncture during hospitalization which showed elevated RBC, WBC, protein concerning for viral meningitis.  ID was consulted, placed on acyclovir  HCAP -Possibly has a component of aspiration, on tube feeds, secretions with coughing -Patient was placed on IV broad-spectrum antibiotics including vancomycin and cefepime. -De-escalated antibiotics to IV cefepime -CT chest showed consolidation in the left lower lobe compatible with pneumonia, small left pleural effusion, numerous small scattered pulmonary nodules 5 mm or less in size, stable since prior studies in 2016, benign    Seizure (HCC) -EEG showed diffuse slowing no seizures, ammonia within normal limits, TSH, B12 normal -Currently on Depakote, management per neurology    Benign essential HTN -Currently stable    Diabetes mellitus type 2 in nonobese (HCC) -Continue sliding scale insulin, avoid hypoglycemia  Code Status: DNR DVT Prophylaxis:   SCD's Family Communication: Discussed in detail with the patient, all imaging results, lab results explained to the patient's wife    Disposition Plan:   Time  Spent in minutes 25 minutes  Procedures:    Antimicrobials:   IV vancomycin 12/23 > 12/25  IV cefepime 12/23 >   Medications  Scheduled Meds: . aspirin  81 mg Per Tube Daily  . calcium-vitamin D  1 tablet Per Tube Q breakfast  . chlorhexidine  15 mL Mouth Rinse BID  . cholecalciferol  2,000 Units Per Tube Daily  . fluticasone  2 spray Each Nare Daily  . glyBURIDE  5 mg Per Tube BID WC   And  . metFORMIN  1,000 mg Per Tube BID WC  . insulin aspart  0-9 Units Subcutaneous Q4H  . losartan  25 mg Per Tube Daily  . mouth rinse  15 mL Mouth Rinse q12n4p  . metoprolol tartrate  12.5 mg Per Tube BID  . multivitamin with minerals  1  tablet Per Tube Daily  . omega-3 acid ethyl esters  1 g Per Tube Daily  . pantoprazole (PROTONIX) IV  40 mg Intravenous QHS  . pravastatin  10 mg Per Tube q1800   Continuous Infusions: . ceFEPime (MAXIPIME) IV 1 g (11/26/17 1057)  . feeding supplement (JEVITY 1.2 CAL) 60 mL/hr at 11/25/17 2311  . valproic acid (DEPACON) IVPB 500 mg (11/26/17 0545)   PRN Meds:.acetaminophen, ipratropium-albuterol, labetalol, meloxicam, traMADol   Antibiotics   Anti-infectives (From admission, onward)   Start     Dose/Rate Route Frequency Ordered Stop   11/26/17 1100  ceFEPIme (MAXIPIME) 1 g in dextrose 5 % 50 mL IVPB     1 g 100 mL/hr over 30 Minutes Intravenous Every 12 hours 11/26/17 1046     11/23/17 1430  vancomycin (VANCOCIN) 500 mg in sodium chloride 0.9 % 100 mL IVPB  Status:  Discontinued     500 mg 100 mL/hr over 60 Minutes Intravenous Every 12 hours 11/23/17 1314 11/25/17 0939   11/23/17 1400  ceFEPIme (MAXIPIME) 1 g in dextrose 5 % 50 mL IVPB  Status:  Discontinued     1 g 100 mL/hr over 30 Minutes Intravenous Every 8 hours 11/23/17 1308 11/23/17 1310   11/23/17 1400  ceFEPIme (MAXIPIME) 1 g in dextrose 5 % 50 mL IVPB  Status:  Discontinued     1 g 100 mL/hr over 30 Minutes Intravenous Every 24 hours 11/23/17 1311 11/26/17 1046   11/20/17 2200  acyclovir (ZOVIRAX) 560 mg in dextrose 5 % 100 mL IVPB  Status:  Discontinued     10 mg/kg  56.2 kg 111.2 mL/hr over 60 Minutes Intravenous Every 12 hours 11/20/17 2118 11/22/17 1300        Subjective:   [name] was seen and examined today.  Awake but difficult to obtain any review of system from the patient, wife at the bedside.  No fevers no acute issues reported  Objective:   Vitals:   11/26/17 0551 11/26/17 0600 11/26/17 0810 11/26/17 1007  BP: (!) 170/73   (!) 177/81  Pulse:    87  Resp:      Temp: 99 F (37.2 C)     TempSrc: Oral     SpO2:  96% 94%   Weight:      Height:        Intake/Output Summary (Last 24 hours)  at 11/26/2017 1147 Last data filed at 11/26/2017 1015 Gross per 24 hour  Intake 2300 ml  Output 675 ml  Net 1625 ml     Wt Readings from Last 3 Encounters:  11/26/17 62.3 kg (137 lb 5.6 oz)  11/19/17  56.2 kg (124 lb)  01/25/17 60.8 kg (134 lb)     Exam   General: Somnolent  Eyes  HEENT:  Atraumatic, normocephalic, dobhoff +  Cardiovascular: S1 S2 auscultated, no rubs, murmurs or gallops. Regular rate and rhythm. No pedal edema b/l  Respiratory: Bilateral scattered rhonchi  Gastrointestinal: Soft, nontender, nondistended, + bowel sounds  Ext: no pedal edema bilaterally  Neuro: not following commands  Musculoskeletal: No digital cyanosis, clubbing  Skin: No rashes  Psych: somnolent  Data Reviewed:  I have personally reviewed following labs and imaging studies  Micro Results Recent Results (from the past 240 hour(s))  MRSA PCR Screening     Status: None   Collection Time: 11/19/17  9:24 PM  Result Value Ref Range Status   MRSA by PCR NEGATIVE NEGATIVE Final    Comment:        The GeneXpert MRSA Assay (FDA approved for NASAL specimens only), is one component of a comprehensive MRSA colonization surveillance program. It is not intended to diagnose MRSA infection nor to guide or monitor treatment for MRSA infections.   Culture, blood (routine x 2)     Status: None   Collection Time: 11/20/17 11:05 AM  Result Value Ref Range Status   Specimen Description BLOOD LEFT ANTECUBITAL  Final   Special Requests IN PEDIATRIC BOTTLE Blood Culture adequate volume  Final   Culture NO GROWTH 5 DAYS  Final   Report Status 11/25/2017 FINAL  Final  Culture, blood (routine x 2)     Status: None   Collection Time: 11/20/17 11:10 AM  Result Value Ref Range Status   Specimen Description BLOOD LEFT ANTECUBITAL  Final   Special Requests IN PEDIATRIC BOTTLE Blood Culture adequate volume  Final   Culture NO GROWTH 5 DAYS  Final   Report Status 11/25/2017 FINAL  Final  CSF  culture     Status: None   Collection Time: 11/20/17  6:46 PM  Result Value Ref Range Status   Specimen Description CSF  Final   Special Requests NONE  Final   Gram Stain   Final    WBC PRESENT, PREDOMINANTLY PMN NO ORGANISMS SEEN CYTOSPIN SMEAR    Culture NO GROWTH 3 DAYS  Final   Report Status 11/24/2017 FINAL  Final  Anaerobic culture     Status: None   Collection Time: 11/20/17  6:46 PM  Result Value Ref Range Status   Specimen Description CSF  Final   Special Requests NONE  Final   Culture NO ANAEROBES ISOLATED  Final   Report Status 11/25/2017 FINAL  Final  Culture, blood (routine x 2) Call MD if unable to obtain prior to antibiotics being given     Status: None (Preliminary result)   Collection Time: 11/23/17  3:35 PM  Result Value Ref Range Status   Specimen Description BLOOD LEFT ANTECUBITAL  Final   Special Requests IN PEDIATRIC BOTTLE Blood Culture adequate volume  Final   Culture NO GROWTH 2 DAYS  Final   Report Status PENDING  Incomplete  Culture, blood (routine x 2) Call MD if unable to obtain prior to antibiotics being given     Status: None (Preliminary result)   Collection Time: 11/23/17  3:41 PM  Result Value Ref Range Status   Specimen Description BLOOD RIGHT ANTECUBITAL  Final   Special Requests   Final    BOTTLES DRAWN AEROBIC ONLY Blood Culture adequate volume   Culture NO GROWTH 2 DAYS  Final   Report Status PENDING  Incomplete    Radiology Reports Ct Angio Head W Or Wo Contrast  Result Date: 11/19/2017 CLINICAL DATA:  Acute onset dizziness and aphasia at 1400 hours. RIGHT-sided weakness. Assess stroke. EXAM: CT ANGIOGRAPHY HEAD AND NECK CT PERFUSION BRAIN TECHNIQUE: Multidetector CT imaging of the head and neck was performed using the standard protocol during bolus administration of intravenous contrast. Multiplanar CT image reconstructions and MIPs were obtained to evaluate the vascular anatomy. Carotid stenosis measurements (when applicable) are  obtained utilizing NASCET criteria, using the distal internal carotid diameter as the denominator. Multiphase CT imaging of the brain was performed following IV bolus contrast injection. Subsequent parametric perfusion maps were calculated using RAPID software. CONTRAST:  ISOVUE-370 IOPAMIDOL (ISOVUE-370) INJECTION 76% COMPARISON:  CT HEAD November 19, 2017 at 1627 hours FINDINGS: CT HEAD FINDINGS BRAIN: No intraparenchymal hemorrhage, mass effect nor midline shift. The ventricles and sulci are normal for age. Patchy supratentorial white matter hypodensities within normal range for patient's age, though non-specific are most compatible with chronic small vessel ischemic disease. Punctate LEFT frontal lobe calcification. Old RIGHT basal ganglia lacunar infarct. No acute large vascular territory infarcts. No abnormal extra-axial fluid collections. Basal cisterns are patent. VASCULAR: Moderate calcific atherosclerosis of the carotid siphons in RIGHT vertebral artery. SKULL: No skull fracture. No significant scalp soft tissue swelling. SINUSES/ORBITS: Mild lobulated paranasal sinus mucosal thickening without air-fluid levels. Mastoid air cells are well aerated.The included ocular globes and orbital contents are non-suspicious. Status post bilateral ocular lens implants. OTHER: None. CTA NECK AORTIC ARCH: Normal appearance of the thoracic arch, normal branch pattern. Moderate calcific atherosclerosis aortic arch. The origins of the innominate, left Common carotid artery and subclavian artery are widely patent. RIGHT CAROTID SYSTEM: Common carotid artery is widely patent, mild calcific atherosclerosis. Moderate calcific atherosclerosis carotid bifurcation without hemodynamically significant stenosis by NASCET criteria. Normal appearance of the internal carotid artery. LEFT CAROTID SYSTEM: Common carotid artery is widely patent, mild calcific atherosclerosis. Moderate calcific atherosclerosis without hemodynamically  significant stenosis by NASCET criteria. Focal luminal irregularity LEFT internal carotid artery origin, favoring sequelae of atherosclerosis, less likely pseudo aneurysm. VERTEBRAL ARTERIES:RIGHT vertebral artery is dominant. Calcific atherosclerosis resulting in severe stenosis RIGHT vertebral artery. The severe stenosis versus occluded LEFT P1 origin with immediate reconstitution, at diminutive thready vessel. SKELETON: No acute osseous process though bone windows have not been submitted. Patient is edentulous. Multilevel moderate to severe RIGHT cervical facet arthropathy. OTHER NECK: Soft tissues of the neck are nonacute though, not tailored for evaluation. UPPER CHEST: Tiny scattered centrilobular ground-glass nodules most compatible with respiratory bronchiolitis. Centrilobular emphysema. Multiple LEFT upper lobe pulmonary nodules including 10 mm spiculated nodule (series 15, image 221/280). LEFT upper lobe calcified granuloma. No superior mediastinal lymphadenopathy. OTHER: None. CTA HEAD ANTERIOR CIRCULATION: Patent cervical internal carotid arteries, petrous, cavernous and supra clinoid internal carotid arteries. Atherosclerosis resulting in mild stenosis RIGHT supraclinoid internal carotid artery. Patent anterior communicating artery. Patent anterior and middle cerebral arteries. No large vessel occlusion, significant stenosis, contrast extravasation or aneurysm. POSTERIOR CIRCULATION: Patent RIGHT vertebral artery, vertebrobasilar junction and basilar artery, as well as main branch vessels. Extremely diminutive LEFT vertebral artery, with tandem severe stenosis versus occlusion and reconstitution. Bilateral posterior inferior cerebellar arteries are patent. Small LEFT P1 segment, robust LEFT posterior communicating artery. Patent posterior cerebral arteries. No large vessel occlusion, significant stenosis, contrast extravasation or aneurysm. VENOUS SINUSES: Major dural venous sinuses are patent though  not tailored for evaluation on this angiographic examination. ANATOMIC VARIANTS: None. DELAYED PHASE: Not performed.  MIP images reviewed. CT Brain Perfusion Findings: CBF (<30%) Volume: 0mL Perfusion (Tmax>6.0s) volume: 9mL Mismatch Volume: 9mL Infarction Location:LEFT posterior parietal lobe. Generalized prolonged T-max LEFT MCA and posterior watershed territory with preserved flow, potentially from ICA stenosis though, preserved LEFT ACA profusion. IMPRESSION: CT HEAD: 1. No acute intracranial process. 2. Old RIGHT basal ganglia lacunar infarct, otherwise negative noncontrast CT HEAD for age. CTA NECK: 1. Atherosclerosis without hemodynamically significant stenosis. 2. Severe stenosis versus occluded LEFT vertebral artery origin with immediate reconstitution, diminutive LEFT vertebral artery. 3. **An incidental finding of potential clinical significance has been found. Multiple LEFT upper lobe pulmonary nodules including 10 mm spiculated nodule. Recommend contrast-enhanced CT chest on a nonemergent basis to assess for extent of involvement. ** CTA HEAD: 1. No emergent large vessel occlusion or severe stenosis anterior circulation. 2. Severe stenosis versus tandem occlusion diminutive LEFT vertebral artery. RIGHT vertebral artery is dominant. CT PERFUSION: 1. Small perfusion defect LEFT parietal lobe/posterior watershed territory suggesting brain at risk. Acute findings discussed with and reconfirmed by Dr.Lindzen, Neurology on 11/19/2017 at 6:35 pm. Aortic Atherosclerosis (ICD10-I70.0) and Emphysema (ICD10-J43.9). Electronically Signed   By: Awilda Metro M.D.   On: 11/19/2017 18:36   Dg Chest 2 View  Result Date: 11/23/2017 CLINICAL DATA:  History of cerebral infarction.  Pneumonia. EXAM: CHEST  2 VIEW COMPARISON:  11/21/2017 FINDINGS: Stable mild cardiac enlargement. Feeding tube present extending below the diaphragm. Lungs show progressive airspace disease and consolidation of the left lower lobe  consistent with pneumonia. There may be some associated left pleural fluid. No edema or pneumothorax. IMPRESSION: Progressive left lower lobe pneumonia. There may be some associated left pleural fluid. Electronically Signed   By: Irish Lack M.D.   On: 11/23/2017 11:13   Ct Angio Neck W Or Wo Contrast  Result Date: 11/19/2017 CLINICAL DATA:  Acute onset dizziness and aphasia at 1400 hours. RIGHT-sided weakness. Assess stroke. EXAM: CT ANGIOGRAPHY HEAD AND NECK CT PERFUSION BRAIN TECHNIQUE: Multidetector CT imaging of the head and neck was performed using the standard protocol during bolus administration of intravenous contrast. Multiplanar CT image reconstructions and MIPs were obtained to evaluate the vascular anatomy. Carotid stenosis measurements (when applicable) are obtained utilizing NASCET criteria, using the distal internal carotid diameter as the denominator. Multiphase CT imaging of the brain was performed following IV bolus contrast injection. Subsequent parametric perfusion maps were calculated using RAPID software. CONTRAST:  ISOVUE-370 IOPAMIDOL (ISOVUE-370) INJECTION 76% COMPARISON:  CT HEAD November 19, 2017 at 1627 hours FINDINGS: CT HEAD FINDINGS BRAIN: No intraparenchymal hemorrhage, mass effect nor midline shift. The ventricles and sulci are normal for age. Patchy supratentorial white matter hypodensities within normal range for patient's age, though non-specific are most compatible with chronic small vessel ischemic disease. Punctate LEFT frontal lobe calcification. Old RIGHT basal ganglia lacunar infarct. No acute large vascular territory infarcts. No abnormal extra-axial fluid collections. Basal cisterns are patent. VASCULAR: Moderate calcific atherosclerosis of the carotid siphons in RIGHT vertebral artery. SKULL: No skull fracture. No significant scalp soft tissue swelling. SINUSES/ORBITS: Mild lobulated paranasal sinus mucosal thickening without air-fluid levels. Mastoid air  cells are well aerated.The included ocular globes and orbital contents are non-suspicious. Status post bilateral ocular lens implants. OTHER: None. CTA NECK AORTIC ARCH: Normal appearance of the thoracic arch, normal branch pattern. Moderate calcific atherosclerosis aortic arch. The origins of the innominate, left Common carotid artery and subclavian artery are widely patent. RIGHT CAROTID SYSTEM: Common carotid artery is widely patent, mild calcific atherosclerosis. Moderate calcific  atherosclerosis carotid bifurcation without hemodynamically significant stenosis by NASCET criteria. Normal appearance of the internal carotid artery. LEFT CAROTID SYSTEM: Common carotid artery is widely patent, mild calcific atherosclerosis. Moderate calcific atherosclerosis without hemodynamically significant stenosis by NASCET criteria. Focal luminal irregularity LEFT internal carotid artery origin, favoring sequelae of atherosclerosis, less likely pseudo aneurysm. VERTEBRAL ARTERIES:RIGHT vertebral artery is dominant. Calcific atherosclerosis resulting in severe stenosis RIGHT vertebral artery. The severe stenosis versus occluded LEFT P1 origin with immediate reconstitution, at diminutive thready vessel. SKELETON: No acute osseous process though bone windows have not been submitted. Patient is edentulous. Multilevel moderate to severe RIGHT cervical facet arthropathy. OTHER NECK: Soft tissues of the neck are nonacute though, not tailored for evaluation. UPPER CHEST: Tiny scattered centrilobular ground-glass nodules most compatible with respiratory bronchiolitis. Centrilobular emphysema. Multiple LEFT upper lobe pulmonary nodules including 10 mm spiculated nodule (series 15, image 221/280). LEFT upper lobe calcified granuloma. No superior mediastinal lymphadenopathy. OTHER: None. CTA HEAD ANTERIOR CIRCULATION: Patent cervical internal carotid arteries, petrous, cavernous and supra clinoid internal carotid arteries. Atherosclerosis  resulting in mild stenosis RIGHT supraclinoid internal carotid artery. Patent anterior communicating artery. Patent anterior and middle cerebral arteries. No large vessel occlusion, significant stenosis, contrast extravasation or aneurysm. POSTERIOR CIRCULATION: Patent RIGHT vertebral artery, vertebrobasilar junction and basilar artery, as well as main branch vessels. Extremely diminutive LEFT vertebral artery, with tandem severe stenosis versus occlusion and reconstitution. Bilateral posterior inferior cerebellar arteries are patent. Small LEFT P1 segment, robust LEFT posterior communicating artery. Patent posterior cerebral arteries. No large vessel occlusion, significant stenosis, contrast extravasation or aneurysm. VENOUS SINUSES: Major dural venous sinuses are patent though not tailored for evaluation on this angiographic examination. ANATOMIC VARIANTS: None. DELAYED PHASE: Not performed. MIP images reviewed. CT Brain Perfusion Findings: CBF (<30%) Volume: 0mL Perfusion (Tmax>6.0s) volume: 9mL Mismatch Volume: 9mL Infarction Location:LEFT posterior parietal lobe. Generalized prolonged T-max LEFT MCA and posterior watershed territory with preserved flow, potentially from ICA stenosis though, preserved LEFT ACA profusion. IMPRESSION: CT HEAD: 1. No acute intracranial process. 2. Old RIGHT basal ganglia lacunar infarct, otherwise negative noncontrast CT HEAD for age. CTA NECK: 1. Atherosclerosis without hemodynamically significant stenosis. 2. Severe stenosis versus occluded LEFT vertebral artery origin with immediate reconstitution, diminutive LEFT vertebral artery. 3. **An incidental finding of potential clinical significance has been found. Multiple LEFT upper lobe pulmonary nodules including 10 mm spiculated nodule. Recommend contrast-enhanced CT chest on a nonemergent basis to assess for extent of involvement. ** CTA HEAD: 1. No emergent large vessel occlusion or severe stenosis anterior circulation. 2.  Severe stenosis versus tandem occlusion diminutive LEFT vertebral artery. RIGHT vertebral artery is dominant. CT PERFUSION: 1. Small perfusion defect LEFT parietal lobe/posterior watershed territory suggesting brain at risk. Acute findings discussed with and reconfirmed by Dr.Lindzen, Neurology on 11/19/2017 at 6:35 pm. Aortic Atherosclerosis (ICD10-I70.0) and Emphysema (ICD10-J43.9). Electronically Signed   By: Awilda Metro M.D.   On: 11/19/2017 18:36   Ct Chest W Contrast  Result Date: 11/23/2017 CLINICAL DATA:  Pneumonia EXAM: CT CHEST WITH CONTRAST TECHNIQUE: Multidetector CT imaging of the chest was performed during intravenous contrast administration. CONTRAST:  75mL ISOVUE-300 IOPAMIDOL (ISOVUE-300) INJECTION 61% COMPARISON:  11/23/2017. Chest CT 09/22/2017, remote films dating back to 07/17/2015. FINDINGS: Cardiovascular: Prior CABG. Cardiomegaly. Moderate aortic calcifications. No aneurysm or adenopathy. Mediastinum/Nodes: No mediastinal, hilar, or axillary adenopathy. Lungs/Pleura: Small left pleural effusion. Consolidation in the left lower lobe compatible with pneumonia. No central obstructing mass or endobronchial lesion. Clustered nodules in the right middle lobe, the largest  5 mm. Small nodules in the lingula on image 71, 5 mm or less. Numerous scattered similarly sized nodules in both lungs. Upper Abdomen: Imaging into the upper abdomen shows no acute findings. Musculoskeletal: Chest wall soft tissues are unremarkable. No acute bony abnormality. IMPRESSION: Consolidation in the left lower lobe compatible with pneumonia. Small left pleural effusion. Numerous small scattered pulmonary nodules, 5 mm or less in size. These are stable since prior studies dating back to 2016 compatible with benign nodules. Cardiomegaly.  Prior CABG. Aortic Atherosclerosis (ICD10-I70.0). Electronically Signed   By: Charlett NoseKevin  Dover M.D.   On: 11/23/2017 17:28   Mr Brain Wo Contrast  Result Date:  11/20/2017 CLINICAL DATA:  Acute onset aphasia EXAM: MRI HEAD WITHOUT CONTRAST TECHNIQUE: Multiplanar, multiecho pulse sequences of the brain and surrounding structures were obtained without intravenous contrast. COMPARISON:  Head CT 11/19/2017 FINDINGS: Brain: The midline structures are normal. There is no acute infarct or acute hemorrhage. No mass lesion, hydrocephalus, dural abnormality or extra-axial collection. Old right corona radiata lacunar infarct. There is multifocal periventricular leukoaraiosis, most commonly seen in the setting of chronic ischemic microangiopathy. No age-advanced or lobar predominant atrophy. No chronic microhemorrhage or superficial siderosis. Vascular: There is loss of the normal left vertebral artery flow void. The other major intracranial flow voids of the skullbase are preserved. Skull and upper cervical spine: The visualized skull base, calvarium, upper cervical spine and extracranial soft tissues are normal. Sinuses/Orbits: Small right mastoid effusion. Paranasal sinuses are clear. Normal orbits. IMPRESSION: 1. No acute intracranial abnormality. 2. Chronic microvascular ischemia and old right corona radiata lacunar infarct. 3. Loss of the normal left vertebral artery flow void, consistent with the severely diminutive left vertebral artery demonstrated on the earlier CTA. Electronically Signed   By: Deatra RobinsonKevin  Herman M.D.   On: 11/20/2017 04:16   Mr Laqueta JeanBrain W WUWo Contrast  Result Date: 11/20/2017 CLINICAL DATA:  Initial evaluation for follow-up stroke, status post tPA. EXAM: MRI HEAD WITHOUT AND WITH CONTRAST TECHNIQUE: Multiplanar, multiecho pulse sequences of the brain and surrounding structures were obtained without and with intravenous contrast. CONTRAST:  10mL MULTIHANCE GADOBENATE DIMEGLUMINE 529 MG/ML IV SOLN COMPARISON:  Prior CT from 11/19/2017 as well as previous MRI from earlier the same day. FINDINGS: Brain: Generalized age related cerebral atrophy. Patchy confluent  T2/FLAIR hyperintensity within the periventricular white matter, most consistent with chronic small vascular disease. Remote lacunar infarct again noted within the anterior right corona radiata. No abnormal foci of restricted diffusion to suggest acute or subacute ischemia. Gray-white matter differentiation maintained. No evidence for acute intracranial hemorrhage status post tPA administration. No mass lesion, midline shift or mass effect. Ventricles stable in size without hydrocephalus. No extra-axial fluid collection. Major dural sinuses are grossly patent. No abnormal enhancement. Apparent irregular enhancement within the right cerebellar hemisphere on axial post-contrast imaging felt to be most consistent with artifact, and is not seen on corresponding coronal postcontrast sequence. No imaging findings to suggest acute CNS infection. Pituitary and suprasellar region grossly normal. Vascular: Hypoplastic left vertebral artery again noted. Major intravascular flow voids otherwise maintained. Skull and upper cervical spine: Craniocervical junction grossly normal. Bone marrow signal intensity within normal limits. No appreciable scalp soft tissue abnormality. Sinuses/Orbits: Globes and oval soft tissues within normal limits. Patient status post lens extraction bilaterally. Scattered mucosal thickening noted within the ethmoidal air cells and maxillary sinuses. Paranasal sinuses are otherwise clear. No air-fluid level to suggest acute sinusitis. Right mastoid effusion noted, stable. Inner ear structures normal. Other: None. IMPRESSION:  1. No acute intracranial abnormality identified. No evidence for acute infarct or hemorrhage status post tPA administration. No imaging findings to suggest acute CNS infection. 2. Stable atrophy with chronic small vessel ischemic disease with old right corona radiata lacunar infarct. 3. Right mastoid effusion. Electronically Signed   By: Rise Mu M.D.   On: 11/20/2017  18:36   Mr Cervical Spine W Wo Contrast  Result Date: 11/22/2017 CLINICAL DATA:  LP concerning for viral meningitis. Patient nonverbal, and not following commands. RIGHT-sided weakness. EXAM: MRI CERVICAL SPINE WITHOUT AND WITH CONTRAST TECHNIQUE: Multiplanar and multiecho pulse sequences of the cervical spine, to include the craniocervical junction and cervicothoracic junction, were obtained without and with intravenous contrast. CONTRAST:  10mL MULTIHANCE GADOBENATE DIMEGLUMINE 529 MG/ML IV SOLN COMPARISON:  MRI brain 11/20/2017. FINDINGS: Alignment: Physiologic. Vertebrae: No fracture, evidence of discitis, or bone lesion. Cord: Normal signal and morphology. Posterior Fossa, vertebral arteries, paraspinal tissues: Negative. Disc levels: C2-3:  Normal. C3-4:  Normal.  Facet arthropathy. C4-5:  Normal.  Facet arthropathy. C5-6: Shallow central protrusion. Mild stenosis. Minimal cord flattening. No definite foraminal narrowing. C6-7: Disc space narrowing. Mild stenosis. Minimal cord flattening. RIGHT-sided foraminal narrowing due to uncinate spurring. C7-T1:  Unremarkable. Post infusion imaging does not demonstrate enhancement of the vertebral bodies, or intraspinal contents. No paravertebral focus of infection is evident. IMPRESSION: No concerning features of meningeal enhancement or cord hyperintensity. Ordinary spondylosis, without features of discitis. See discussion above. Electronically Signed   By: Elsie Stain M.D.   On: 11/22/2017 19:06   Ct Cerebral Perfusion W Contrast  Result Date: 11/19/2017 CLINICAL DATA:  Acute onset dizziness and aphasia at 1400 hours. RIGHT-sided weakness. Assess stroke. EXAM: CT ANGIOGRAPHY HEAD AND NECK CT PERFUSION BRAIN TECHNIQUE: Multidetector CT imaging of the head and neck was performed using the standard protocol during bolus administration of intravenous contrast. Multiplanar CT image reconstructions and MIPs were obtained to evaluate the vascular anatomy.  Carotid stenosis measurements (when applicable) are obtained utilizing NASCET criteria, using the distal internal carotid diameter as the denominator. Multiphase CT imaging of the brain was performed following IV bolus contrast injection. Subsequent parametric perfusion maps were calculated using RAPID software. CONTRAST:  ISOVUE-370 IOPAMIDOL (ISOVUE-370) INJECTION 76% COMPARISON:  CT HEAD November 19, 2017 at 1627 hours FINDINGS: CT HEAD FINDINGS BRAIN: No intraparenchymal hemorrhage, mass effect nor midline shift. The ventricles and sulci are normal for age. Patchy supratentorial white matter hypodensities within normal range for patient's age, though non-specific are most compatible with chronic small vessel ischemic disease. Punctate LEFT frontal lobe calcification. Old RIGHT basal ganglia lacunar infarct. No acute large vascular territory infarcts. No abnormal extra-axial fluid collections. Basal cisterns are patent. VASCULAR: Moderate calcific atherosclerosis of the carotid siphons in RIGHT vertebral artery. SKULL: No skull fracture. No significant scalp soft tissue swelling. SINUSES/ORBITS: Mild lobulated paranasal sinus mucosal thickening without air-fluid levels. Mastoid air cells are well aerated.The included ocular globes and orbital contents are non-suspicious. Status post bilateral ocular lens implants. OTHER: None. CTA NECK AORTIC ARCH: Normal appearance of the thoracic arch, normal branch pattern. Moderate calcific atherosclerosis aortic arch. The origins of the innominate, left Common carotid artery and subclavian artery are widely patent. RIGHT CAROTID SYSTEM: Common carotid artery is widely patent, mild calcific atherosclerosis. Moderate calcific atherosclerosis carotid bifurcation without hemodynamically significant stenosis by NASCET criteria. Normal appearance of the internal carotid artery. LEFT CAROTID SYSTEM: Common carotid artery is widely patent, mild calcific atherosclerosis.  Moderate calcific atherosclerosis without hemodynamically significant stenosis by  NASCET criteria. Focal luminal irregularity LEFT internal carotid artery origin, favoring sequelae of atherosclerosis, less likely pseudo aneurysm. VERTEBRAL ARTERIES:RIGHT vertebral artery is dominant. Calcific atherosclerosis resulting in severe stenosis RIGHT vertebral artery. The severe stenosis versus occluded LEFT P1 origin with immediate reconstitution, at diminutive thready vessel. SKELETON: No acute osseous process though bone windows have not been submitted. Patient is edentulous. Multilevel moderate to severe RIGHT cervical facet arthropathy. OTHER NECK: Soft tissues of the neck are nonacute though, not tailored for evaluation. UPPER CHEST: Tiny scattered centrilobular ground-glass nodules most compatible with respiratory bronchiolitis. Centrilobular emphysema. Multiple LEFT upper lobe pulmonary nodules including 10 mm spiculated nodule (series 15, image 221/[id number]). LEFT upper lobe calcified granuloma. No superior mediastinal lymphadenopathy. OTHER: None. CTA HEAD ANTERIOR CIRCULATION: Patent cervical internal carotid arteries, petrous, cavernous and supra clinoid internal carotid arteries. Atherosclerosis resulting in mild stenosis RIGHT supraclinoid internal carotid artery. Patent anterior communicating artery. Patent anterior and middle cerebral arteries. No large vessel occlusion, significant stenosis, contrast extravasation or aneurysm. POSTERIOR CIRCULATION: Patent RIGHT vertebral artery, vertebrobasilar junction and basilar artery, as well as main branch vessels. Extremely diminutive LEFT vertebral artery, with tandem severe stenosis versus occlusion and reconstitution. Bilateral posterior inferior cerebellar arteries are patent. Small LEFT P1 segment, robust LEFT posterior communicating artery. Patent posterior cerebral arteries. No large vessel occlusion, significant stenosis, contrast extravasation or aneurysm.  VENOUS SINUSES: Major dural venous sinuses are patent though not tailored for evaluation on this angiographic examination. ANATOMIC VARIANTS: None. DELAYED PHASE: Not performed. MIP images reviewed. CT Brain Perfusion Findings: CBF (<30%) Volume: 0mL Perfusion (Tmax>6.0s) volume: 9mL Mismatch Volume: 9mL Infarction Location:LEFT posterior parietal lobe. Generalized prolonged T-max LEFT MCA and posterior watershed territory with preserved flow, potentially from ICA stenosis though, preserved LEFT ACA profusion. IMPRESSION: CT HEAD: 1. No acute intracranial process. 2. Old RIGHT basal ganglia lacunar infarct, otherwise negative noncontrast CT HEAD for age. CTA NECK: 1. Atherosclerosis without hemodynamically significant stenosis. 2. Severe stenosis versus occluded LEFT vertebral artery origin with immediate reconstitution, diminutive LEFT vertebral artery. 3. **An incidental finding of potential clinical significance has been found. Multiple LEFT upper lobe pulmonary nodules including 10 mm spiculated nodule. Recommend contrast-enhanced CT chest on a nonemergent basis to assess for extent of involvement. ** CTA HEAD: 1. No emergent large vessel occlusion or severe stenosis anterior circulation. 2. Severe stenosis versus tandem occlusion diminutive LEFT vertebral artery. RIGHT vertebral artery is dominant. CT PERFUSION: 1. Small perfusion defect LEFT parietal lobe/posterior watershed territory suggesting brain at risk. Acute findings discussed with and reconfirmed by Dr.Lindzen, Neurology on 11/19/2017 at 6:35 pm. Aortic Atherosclerosis (ICD10-I70.0) and Emphysema (ICD10-J43.9). Electronically Signed   By: Awilda Metro M.D.   On: 11/19/2017 18:36   Dg Chest Port 1 View  Result Date: 11/21/2017 CLINICAL DATA:  Coughing. EXAM: PORTABLE CHEST 1 VIEW COMPARISON:  11/20/2017 FINDINGS: Stable changes from prior cardiac surgery. Cardiac silhouette is mildly enlarged. No mediastinal or hilar masses. Opacities noted  at the medial left lung base similar to the prior exam. This may reflect atelectasis or pneumonia. Mild chronic scarring in the left upper lobe. Remainder of the lungs is clear. No pleural effusion or pneumothorax. IMPRESSION: 1. No significant change from the previous day's study. 2. Left medial lung base opacity is again noted consistent with pneumonia or atelectasis. Electronically Signed   By: Amie Portland M.D.   On: 11/21/2017 09:30   Dg Chest Port 1 View  Result Date: 11/20/2017 CLINICAL DATA:  Fever. EXAM: PORTABLE CHEST 1 VIEW COMPARISON:  CT scan of the chest dated 09/22/2017 and chest x-ray dated 03/06/2015 FINDINGS: There is a small area of increased density at the left lung base medially, new since the prior exam. There was an area of slight atelectasis at that site on the prior CT scan. Calcified granulomas in the left lung apex, unchanged. The lungs are otherwise clear. Heart size and pulmonary vascularity are normal. CABG. No acute bone abnormality. IMPRESSION: Small area of infiltrate or increased atelectasis in the left lung base, new since the prior CT scan. Electronically Signed   By: Francene Boyers M.D.   On: 11/20/2017 15:17   Ct Head Code Stroke Wo Contrast  Result Date: 11/19/2017 CLINICAL DATA:  Code stroke.  Sudden onset dizziness and aphasia. EXAM: CT HEAD WITHOUT CONTRAST TECHNIQUE: Contiguous axial images were obtained from the base of the skull through the vertex without intravenous contrast. COMPARISON:  None FINDINGS: Brain: A 2 mm punctate density in the left frontal white matter is favored to reflect a calcification. No definite intracranial hemorrhage is identified. There is a chronic lacunar infarct in the right lentiform nucleus. No acute cortically based infarct, mass, midline shift, or extra-axial fluid collection is identified. Periventricular white matter hypodensities are nonspecific but compatible with mild chronic small vessel ischemic disease. Generalized  cerebral atrophy is relatively mild for age. Vascular: Calcified atherosclerosis at the skullbase. No hyperdense vessel. Skull: No fracture or focal osseous lesion. Sinuses/Orbits: Mild bilateral ethmoid air cell mucosal thickening. Clear mastoid air cells. No acute findings in the included orbits. Other: None. ASPECTS [facility] Stroke Program Early CT Score) - Ganglionic level infarction (caudate, lentiform nuclei, internal capsule, insula, M1-M3 cortex): 7 - Supraganglionic infarction (M4-M6 cortex): 3 Total score (0-10 with 10 being normal): 10 IMPRESSION: 1. No evidence of acute intracranial abnormality. 2. ASPECTS is 10. 3. Mild chronic small vessel ischemic disease and cerebral atrophy. Chronic right basal ganglia lacunar infarct. These results were called by telephone at the time of interpretation on 11/19/2017 at 4:40 pm to Dr. Sharman Cheek , who verbally acknowledged these results. Electronically Signed   By: Sebastian Ache M.D.   On: 11/19/2017 16:40    Lab Data:  CBC: Recent Labs  Lab 11/19/17 1620 11/20/17 0728 11/21/17 0540 11/22/17 0514 11/23/17 0354 11/24/17 0307  WBC 6.5 8.3 8.3 8.0 8.1 7.5  NEUTROABS 4.8  --   --   --   --  5.6  HGB 11.7* 11.6* 11.5* 10.9* 10.8* 10.1*  HCT 35.6* 34.9* 34.3* 32.6* 32.0* 29.8*  MCV 94.3 92.1 91.5 91.8 92.0 92.0  PLT 240 232 212 212 203 180   Basic Metabolic Panel: Recent Labs  Lab 11/20/17 0728 11/21/17 0540 11/22/17 0514 11/23/17 0354 11/24/17 0307  NA 138 137 138 140 144  K 4.3 3.9 3.9 3.4* 3.3*  CL 104 103 107 109 114*  CO2 21* 23 22 24 24   GLUCOSE 90 104* 97 195* 158*  BUN 11 13 15 18 17   CREATININE 1.12 1.23 1.17 1.06 0.90  CALCIUM 9.2 8.8* 8.3* 8.2* 8.2*   GFR: Estimated Creatinine Clearance: 51.9 mL/min (by C-G formula based on SCr of 0.9 mg/dL). Liver Function Tests: Recent Labs  Lab 11/19/17 1620  AST 24  ALT 17  ALKPHOS 65  BILITOT 0.7  PROT 6.7  ALBUMIN 4.2   No results for input(s): LIPASE, AMYLASE in the  last 168 hours. Recent Labs  Lab 11/20/17 1113  AMMONIA 13   Coagulation Profile: Recent Labs  Lab 11/19/17 1620  INR  1.04   Cardiac Enzymes: Recent Labs  Lab 11/19/17 1620 11/22/17 0514  TROPONINI 0.05* 0.12*   BNP (last 3 results) No results for input(s): PROBNP in the last 8760 hours. HbA1C: No results for input(s): HGBA1C in the last 72 hours. CBG: Recent Labs  Lab 11/25/17 1653 11/25/17 2054 11/26/17 0130 11/26/17 0422 11/26/17 0736  GLUCAP 199* 144* 145* 172* 185*   Lipid Profile: No results for input(s): CHOL, HDL, LDLCALC, TRIG, CHOLHDL, LDLDIRECT in the last 72 hours. Thyroid Function Tests: No results for input(s): TSH, T4TOTAL, FREET4, T3FREE, THYROIDAB in the last 72 hours. Anemia Panel: No results for input(s): VITAMINB12, FOLATE, FERRITIN, TIBC, IRON, RETICCTPCT in the last 72 hours. Urine analysis:    Component Value Date/Time   COLORURINE YELLOW 11/23/2017 1435   APPEARANCEUR CLEAR 11/23/2017 1435   LABSPEC 1.025 11/23/2017 1435   PHURINE 5.0 11/23/2017 1435   GLUCOSEU 50 (A) 11/23/2017 1435   HGBUR NEGATIVE 11/23/2017 1435   BILIRUBINUR NEGATIVE 11/23/2017 1435   KETONESUR 5 (A) 11/23/2017 1435   PROTEINUR 100 (A) 11/23/2017 1435   NITRITE NEGATIVE 11/23/2017 1435   LEUKOCYTESUR NEGATIVE 11/23/2017 1435     Yee Joss M.D. [facility] 11/26/2017, 11:47 AM  Pager: [phone] Between 7am to 7pm - call Pager - [phone]  After 7pm go to [email or web address] - password [email or web address]  Call night coverage person covering after 7pm

## 2017-11-26 NOTE — Progress Notes (Signed)
SLP Cancellation Note  Patient Details Name: Rosana FretRalph Jicha MRN: [id number] DOB: [birth year]   Cancelled treatment:       Reason Eval/Treat Not Completed: Other (comment)(Pt transferred to comfort care; all orders for ST d/c)   Tressie StalkerPat Kalika Smay, M.S., CCC-SLP 11/26/2017, 4:05 PM

## 2017-11-26 NOTE — Progress Notes (Addendum)
Patient's O2 dropped to mid-80s earlier in shift. After suctioning and placing on 02, his O2 originally stayed above 92% per order. Patient's O2 dropped to mid 80s again late in the shift. Patient sounds more wet & rhonchus even after suctioning and coughing. Patient has low grade temp 99 with BP 170/73. Called respiratory to assess. Resp. Therapist increased O2 to 4L. Patient remains w/ rhonchi lung sounds and more wet. Patient also experienced some type of arrhythmia. Patient also less alert than earlier in shift. Paged K. Schorr. She ordered & stated to give a neb tx to patient. Continue to monitor.

## 2017-11-27 DIAGNOSIS — R627 Adult failure to thrive: Secondary | ICD-10-CM

## 2017-11-27 LAB — VARICELLA-ZOSTER BY PCR: Varicella-Zoster, PCR: NEGATIVE

## 2017-11-27 MED ORDER — IPRATROPIUM-ALBUTEROL 0.5-2.5 (3) MG/3ML IN SOLN: 3.0000 mL | RESPIRATORY_TRACT | Status: DC | PRN

## 2017-11-27 MED ORDER — LORAZEPAM 2 MG/ML IJ SOLN: 1.0000 mg | INTRAMUSCULAR | 0 refills | Status: DC | PRN

## 2017-11-27 MED ORDER — ACETAMINOPHEN 650 MG RE SUPP
650.0000 mg | Freq: Four times a day (QID) | RECTAL | 0 refills | Status: DC | PRN
Start: 2017-11-27 — End: 2023-02-13

## 2017-11-27 MED ORDER — GLYCOPYRROLATE 0.2 MG/ML IJ SOLN: 0.4000 mg | Freq: Three times a day (TID) | INTRAMUSCULAR | Status: DC

## 2017-11-27 MED ORDER — CHLORHEXIDINE GLUCONATE 0.12 % MT SOLN: 15.0000 mL | Freq: Two times a day (BID) | OROMUCOSAL | 0 refills | Status: DC

## 2017-11-27 MED ORDER — VALPROATE SODIUM 500 MG/5ML IV SOLN: 500.0000 mg | Freq: Three times a day (TID) | INTRAVENOUS | Status: DC

## 2017-11-27 MED ORDER — MORPHINE SULFATE (PF) 2 MG/ML IV SOLN: 2.0000 mg | INTRAVENOUS | 0 refills | Status: DC | PRN

## 2017-11-27 NOTE — Progress Notes (Signed)
Patient ID: Peter Becker, male   DOB: [birth year], 83 y.o.   MRN: [id number]  This NP visited patient at the bedside as a follow up for palliative medicine needs and emotional support.  Wife remains at bedside, she stayed the night.  Focus of care was shifted to full comfort yesterday.  Family is  hopeful for residential hospice  facility for end-of-life care, [facility]      Patient is lethargic and unable to take oral intake 2/2 dysphagia/weakness/minimally responsive       No artifical feeding or hydration now or in the future.       No further antibiotic use.      No diagnostics or life prolonging measures     Medication for symptom management          Morphine, ativan , Robinol  Prognosis is likely less than two weeks.  Discussed natural trajectory and expectations at end of life. Questions and concerns addressed.   Discussed with SW  Time in 1205          Time out  1240      Total time spent on the unit 35 minutes  Greater than 50% of the time was spent in counseling and coordination of care  Lorinda CreedMary Larach NP  Palliative Medicine Team Team Phone # [phone] Pager [phone]

## 2017-11-27 NOTE — Progress Notes (Signed)
STROKE TEAM PROGRESS NOTE   SUBJECTIVE (INTERVAL HISTORY) His wife is not at the bedside. Pt still nonverbal but following   all commands. No events overnight.  Family has decided to make the patient comfort care and plan is to transfer him today to residential hospice  OBJECTIVE Temp:  [98.4 F (36.9 C)-99 F (37.2 C)] 98.4 F (36.9 C) (12/27 1354) Pulse Rate:  [72-89] 80 (12/27 1354) Cardiac Rhythm: Normal sinus rhythm (12/27 0930) BP: (156-172)/(66-68) 156/66 (12/27 1354) SpO2:  [93 %-98 %] 93 % (12/27 1354) FiO2 (%):  [4 %] 4 % (12/26 1523)  Recent Labs  Lab 11/25/17 2054 11/26/17 0130 11/26/17 0422 11/26/17 0736 11/26/17 1141  GLUCAP 144* 145* 172* 185* 235*   Recent Labs  Lab 11/21/17 0540 11/22/17 0514 11/23/17 0354 11/24/17 0307  NA 137 138 140 144  K 3.9 3.9 3.4* 3.3*  CL 103 107 109 114*  CO2 23 22 24 24   GLUCOSE 104* 97 195* 158*  BUN 13 15 18 17   CREATININE 1.23 1.17 1.06 0.90  CALCIUM 8.8* 8.3* 8.2* 8.2*   No results for input(s): AST, ALT, ALKPHOS, BILITOT, PROT, ALBUMIN in the last 168 hours. Recent Labs  Lab 11/21/17 0540 11/22/17 0514 11/23/17 0354 11/24/17 0307  WBC 8.3 8.0 8.1 7.5  NEUTROABS  --   --   --  5.6  HGB 11.5* 10.9* 10.8* 10.1*  HCT 34.3* 32.6* 32.0* 29.8*  MCV 91.5 91.8 92.0 92.0  PLT 212 212 203 180   Recent Labs  Lab 11/22/17 0514  TROPONINI 0.12*   No results for input(s): LABPROT, INR in the last 72 hours. No results for input(s): COLORURINE, LABSPEC, PHURINE, GLUCOSEU, HGBUR, BILIRUBINUR, KETONESUR, PROTEINUR, UROBILINOGEN, NITRITE, LEUKOCYTESUR in the last 72 hours.  Invalid input(s): APPERANCEUR     Component Value Date/Time   CHOL 129 11/20/2017 0559   TRIG 56 11/20/2017 0559   HDL 71 11/20/2017 0559   CHOLHDL 1.8 11/20/2017 0559   VLDL 11 11/20/2017 0559   LDLCALC 47 11/20/2017 0559   Lab Results  Component Value Date   HGBA1C 7.3 (H) 11/20/2017      Component Value Date/Time   LABOPIA NONE DETECTED  11/20/2017 1055   COCAINSCRNUR NONE DETECTED 11/20/2017 1055   LABBENZ NONE DETECTED 11/20/2017 1055   AMPHETMU NONE DETECTED 11/20/2017 1055   THCU NONE DETECTED 11/20/2017 1055   LABBARB NONE DETECTED 11/20/2017 1055    No results for input(s): ETH in the last 168 hours.   IMAGING  I have personally reviewed the radiological images below and agree with the radiology interpretations.   MR Cervical Spine With and Without Contrast - pending   Ct Angio Head and neck and CTP W Or Wo Contrast 11/19/2017 IMPRESSION:  CT HEAD:  1. No acute intracranial process.  2. Old RIGHT basal ganglia lacunar infarct, otherwise negative noncontrast CT HEAD for age.   CTA NECK:  1. Atherosclerosis without hemodynamically significant stenosis.  2. Severe stenosis versus occluded LEFT vertebral artery origin with immediate reconstitution, diminutive LEFT vertebral artery.  3. **An incidental finding of potential clinical significance has been found.  Multiple LEFT upper lobe pulmonary nodules including 10 mm spiculated nodule. Recommend contrast-enhanced CT chest on a nonemergent basis to assess for extent of involvement. **    CTA HEAD:  1. No emergent large vessel occlusion or severe stenosis anterior circulation.  2. Severe stenosis versus tandem occlusion diminutive LEFT vertebral artery.   RIGHT vertebral artery is dominant.    CT  PERFUSION:  1. Small perfusion defect LEFT parietal lobe/posterior watershed territory suggesting brain at risk.  Aortic Atherosclerosis (ICD10-I70.0) and Emphysema (ICD10-J43.9).   Mr Brain Wo Contrast 11/20/2017 IMPRESSION:  1. No acute intracranial abnormality.  2. Chronic microvascular ischemia and old right corona radiata lacunar infarct.  3. Loss of the normal left vertebral artery flow void, consistent with the severely diminutive left vertebral artery demonstrated on the earlier CTA.     Ct Head Code Stroke Wo Contrast 11/19/2017 IMPRESSION:  1.  No evidence of acute intracranial abnormality.  2. ASPECTS is 10.  3. Mild chronic small vessel ischemic disease and cerebral atrophy.   Chronic right basal ganglia lacunar infarct.    EEG: 11/20/2017 This EEG is abnormal due to diffuse slowing of the waking background. Clinical Correlation of the above findings indicates diffuse cerebral dysfunction that is non-specific in etiology and can be seen with hypoxic/ischemic injury, toxic/metabolic encephalopathies, neurodegenerative disorders, or medication effect.  However, findings may also be due to excessive drowsiness.  Clinical correlation advised.   EEG: 11/21/2017 Impression: This awake and asleep EEG is abnormal due to occasional focal slowing over the left hemisphere, maximal over the left frontocentrotemporal region.  Clinical Correlation of the above findings indicates focal cerebral dysfunction over the left hemisphere region suggestive of underlying structural or physiologic abnormality. The absence of epileptiform discharges does not exclude a clinical diagnosis of epilepsy. Clinical correlation is advised.   [name] Contrast 11/20/2017 IMPRESSION:  1. No acute intracranial abnormality identified. No evidence for acute infarct or hemorrhage status post tPA administration. No imaging findings to suggest acute CNS infection.  2. Stable atrophy with chronic small vessel ischemic disease with old right corona radiata lacunar infarct.  3. Right mastoid effusion.    Dg Chest Port 1 View 11/20/2017 IMPRESSION:  Small area of infiltrate or increased atelectasis in the left lung base, new since the prior CT scan.    Dg Chest Port 1 View 11/21/2017 IMPRESSION: 1. No significant change from the previous day's study. 2. Left medial lung base opacity is again noted consistent with pneumonia or atelectasis.    TTE  - Left ventricle: The cavity size was normal. Systolic function was   normal. The estimated ejection  fraction was in the range of 50%   to 55%. Hypokinesis of the apical myocardium. Doppler parameters   are consistent with abnormal left ventricular relaxation (grade 1   diastolic dysfunction). Doppler parameters are consistent with   high ventricular filling pressure. - Aortic valve: Transvalvular velocity was within the normal range.   There was no stenosis. There was no regurgitation. Valve area   (VTI): 2.8 cm^2. Valve area (Vmax): 2.67 cm^2. Valve area   (Vmean): 2.38 cm^2. - Mitral valve: Transvalvular velocity was within the normal range.   There was no evidence for stenosis. There was mild regurgitation. - Left atrium: The atrium was moderately dilated. - Right ventricle: The cavity size was normal. Wall thickness was   normal. Systolic function was normal. - Right atrium: The atrium was mildly dilated. - Atrial septum: No defect or patent foramen ovale was identified. - Tricuspid valve: There was no regurgitation.  MRI Cervical spine : No concerning features of meningeal enhancement or cord hyperintensity.Ordinary spondylosis, without features of discitis  PHYSICAL EXAM Vitals:   11/26/17 1007 11/26/17 1523 11/26/17 2331 11/27/17 1354  BP: (!) 177/81 (!) 159/68 (!) 172/66 (!) 156/66  Pulse: 87 72 89 80  Resp:      Temp:  98.6 F (37 C) 99 F (37.2 C) 98.4 F (36.9 C)  TempSrc:  Oral Oral Oral  SpO2:  93% 98% 93%  Weight:      Height:        General - Well nourished, well developed, still obtunded and not following commands.  Ophthalmologic - fundi not visualized due to noncooperation.  Cardiovascular - Regular rate and rhythm.  Neuro -awake alert and now consistently following commands.  . Nonverbal, but attempts to phonate with incomprehensible speech not able to name or repeat,    . Inconsistently blinking to visual threat bilaterally. Eyes can follow movement bilaterally as commanded, doll's eye present. PREEL. Positive corneal and gag. LUE and LLE move  spontaneously and against gravity, RUE 2/5 and RLE 3/5 on pain. DTR 1+ and not cooperative on babinski. Sensation, coordination and gait not tested.   ASSESSMENT/PLAN Mr. Aviraj Kentner is a 83 y.o. male with history of DM, HTN, MI s/p CABG, PVD, and HLD on lovastatin admitted for AMS, confusion, aphasia, wax and waning process. TPA given at OSH.    AMS with intermittent fever - Seizure with Todd paralysis vs. CNS infection -meningoencephalitis presentation concerning for seizure like activity and CSF concerning for viral meningitis    Resultant AMS, nonverbal, agitation, left hemiparesis  MRI  No acute stroke  MRI brain repeat with and without contrast negative  CTA head and neck b/l ICA proximal and right VA origin athero, left VA hypoplastic  2D Echo EF 50-55%  EEG - diffuse slowing no seizure  TSH/B12 and ammonia WNL  LP showed elevated RBC, WBC and protein, concerning for viral meningitis. ID consulted  Blood culture - no growth day 1  Repeat EEG - see above  LDL 47  HgbA1c 7.3  SCDs for VTE prophylaxis  Diet - low sodium heart healthy   aspirin 81 mg daily prior to admission, now on No antithrombotic  Therapy recommendations:  pending  Disposition:  Pending  ? pneumonia   CXR showed new small area of infiltration left lung base  Repeat CXR - no significant change - possible pneumonia - not currently on antibiotics.  Has cough with secretion  Intermittent fever  Diabetes  HgbA1c 7.3 goal < 7.0  Uncontrolled  Home meds -glyburide  CBG monitoring  SSI  Hypertension Stable  Long term BP goal normotensive  Hyperlipidemia  Home meds:  lovastatin   LDL 47, goal < 70  Resume once po access  Continue statin at discharge  Other Stroke Risk Factors  Advanced age  CAD/MI s/p CABG  PVD   Seizure Activity  Now on Depacon - 500 mg IV every 8 hours - level optimal at 71  EEGs 2 - abnormal as noted above   Other Active Problems  Tmax  - 100.9 -> 98.2 Saturday  Multiple LEFT upper lobe pulmonary nodules including 10 mm spiculated nodule. Contrast-enhanced CT chest on a nonemergent basis to assess for extent of involvement recommended.  Possible pneumonia - consider antibiotics will call CCM/medical hospitalist team to consult and help manage  Tube feeds started, nutrition consulted  Hospital day # 8  The  patient has not been improving significantly and given his poor prognosis family has decided on doing full comfort care. Patient has been transferred to residential hospice today. Stroke team will sign off. Kindly call for questions. Delia Heady, MD Medical Director [facility] Pager: [phone] 11/27/2017 2:59 PM   To contact Stroke Continuity provider, please refer to [email or web address]. After hours,  contact General Neurology STROKE TEAM PROGRESS NOTE   SUBJECTIVE (INTERVAL HISTORY) His family is not at the bedside. Pt still nonverbal butt following very few commands. No events overnight.   MRI cervical spine. Shows no compression or significant abnormality  Intermittent cough with secretions. Still has right hemiparesis. LP concerning for viral meningitis. ID following.    OBJECTIVE Temp:  [98.4 F (36.9 C)-99 F (37.2 C)] 98.4 F (36.9 C) (12/27 1354) Pulse Rate:  [72-89] 80 (12/27 1354) Cardiac Rhythm: Normal sinus rhythm (12/27 0930) BP: (156-172)/(66-68) 156/66 (12/27 1354) SpO2:  [93 %-98 %] 93 % (12/27 1354) FiO2 (%):  [4 %] 4 % (12/26 1523)  Recent Labs  Lab 11/25/17 2054 11/26/17 0130 11/26/17 0422 11/26/17 0736 11/26/17 1141  GLUCAP 144* 145* 172* 185* 235*   Recent Labs  Lab 11/21/17 0540 11/22/17 0514 11/23/17 0354 11/24/17 0307  NA 137 138 140 144  K 3.9 3.9 3.4* 3.3*  CL 103 107 109 114*  CO2 23 22 24 24   GLUCOSE 104* 97 195* 158*  BUN 13 15 18 17   CREATININE 1.23 1.17 1.06 0.90  CALCIUM 8.8* 8.3* 8.2* 8.2*   No results for input(s): AST, ALT, ALKPHOS, BILITOT,  PROT, ALBUMIN in the last 168 hours. Recent Labs  Lab 11/21/17 0540 11/22/17 0514 11/23/17 0354 11/24/17 0307  WBC 8.3 8.0 8.1 7.5  NEUTROABS  --   --   --  5.6  HGB 11.5* 10.9* 10.8* 10.1*  HCT 34.3* 32.6* 32.0* 29.8*  MCV 91.5 91.8 92.0 92.0  PLT 212 212 203 180   Recent Labs  Lab 11/22/17 0514  TROPONINI 0.12*   No results for input(s): LABPROT, INR in the last 72 hours. No results for input(s): COLORURINE, LABSPEC, PHURINE, GLUCOSEU, HGBUR, BILIRUBINUR, KETONESUR, PROTEINUR, UROBILINOGEN, NITRITE, LEUKOCYTESUR in the last 72 hours.  Invalid input(s): APPERANCEUR     Component Value Date/Time   CHOL 129 11/20/2017 0559   TRIG 56 11/20/2017 0559   HDL 71 11/20/2017 0559   CHOLHDL 1.8 11/20/2017 0559   VLDL 11 11/20/2017 0559   LDLCALC 47 11/20/2017 0559   Lab Results  Component Value Date   HGBA1C 7.3 (H) 11/20/2017      Component Value Date/Time   LABOPIA NONE DETECTED 11/20/2017 1055   COCAINSCRNUR NONE DETECTED 11/20/2017 1055   LABBENZ NONE DETECTED 11/20/2017 1055   AMPHETMU NONE DETECTED 11/20/2017 1055   THCU NONE DETECTED 11/20/2017 1055   LABBARB NONE DETECTED 11/20/2017 1055    No results for input(s): ETH in the last 168 hours.   IMAGING  I have personally reviewed the radiological images below and agree with the radiology interpretations.   MR Cervical Spine With and Without Contrast - pending   Ct Angio Head and neck and CTP W Or Wo Contrast 11/19/2017 IMPRESSION:  CT HEAD:  1. No acute intracranial process.  2. Old RIGHT basal ganglia lacunar infarct, otherwise negative noncontrast CT HEAD for age.   CTA NECK:  1. Atherosclerosis without hemodynamically significant stenosis.  2. Severe stenosis versus occluded LEFT vertebral artery origin with immediate reconstitution, diminutive LEFT vertebral artery.  3. **An incidental finding of potential clinical significance has been found.  Multiple LEFT upper lobe pulmonary nodules including  10 mm spiculated nodule. Recommend contrast-enhanced CT chest on a nonemergent basis to assess for extent of involvement. **    CTA HEAD:  1. No emergent large vessel occlusion or severe stenosis anterior circulation.  2. Severe stenosis versus tandem occlusion diminutive LEFT vertebral  artery.   RIGHT vertebral artery is dominant.    CT PERFUSION:  1. Small perfusion defect LEFT parietal lobe/posterior watershed territory suggesting brain at risk.  Aortic Atherosclerosis (ICD10-I70.0) and Emphysema (ICD10-J43.9).   Mr Brain Wo Contrast 11/20/2017 IMPRESSION:  1. No acute intracranial abnormality.  2. Chronic microvascular ischemia and old right corona radiata lacunar infarct.  3. Loss of the normal left vertebral artery flow void, consistent with the severely diminutive left vertebral artery demonstrated on the earlier CTA.     Ct Head Code Stroke Wo Contrast 11/19/2017 IMPRESSION:  1. No evidence of acute intracranial abnormality.  2. ASPECTS is 10.  3. Mild chronic small vessel ischemic disease and cerebral atrophy.   Chronic right basal ganglia lacunar infarct.    EEG: 11/20/2017 This EEG is abnormal due to diffuse slowing of the waking background. Clinical Correlation of the above findings indicates diffuse cerebral dysfunction that is non-specific in etiology and can be seen with hypoxic/ischemic injury, toxic/metabolic encephalopathies, neurodegenerative disorders, or medication effect.  However, findings may also be due to excessive drowsiness.  Clinical correlation advised.   EEG: 11/21/2017 Impression: This awake and asleep EEG is abnormal due to occasional focal slowing over the left hemisphere, maximal over the left frontocentrotemporal region.  Clinical Correlation of the above findings indicates focal cerebral dysfunction over the left hemisphere region suggestive of underlying structural or physiologic abnormality. The absence of epileptiform discharges does  not exclude a clinical diagnosis of epilepsy. Clinical correlation is advised.   Mr Laqueta JeanBrain W Wo Contrast 11/20/2017 IMPRESSION:  1. No acute intracranial abnormality identified. No evidence for acute infarct or hemorrhage status post tPA administration. No imaging findings to suggest acute CNS infection.  2. Stable atrophy with chronic small vessel ischemic disease with old right corona radiata lacunar infarct.  3. Right mastoid effusion.    Dg Chest Port 1 View 11/20/2017 IMPRESSION:  Small area of infiltrate or increased atelectasis in the left lung base, new since the prior CT scan.    Dg Chest Port 1 View 11/21/2017 IMPRESSION: 1. No significant change from the previous day's study. 2. Left medial lung base opacity is again noted consistent with pneumonia or atelectasis.    TTE  - Left ventricle: The cavity size was normal. Systolic function was   normal. The estimated ejection fraction was in the range of 50%   to 55%. Hypokinesis of the apical myocardium. Doppler parameters   are consistent with abnormal left ventricular relaxation (grade 1   diastolic dysfunction). Doppler parameters are consistent with   high ventricular filling pressure. - Aortic valve: Transvalvular velocity was within the normal range.   There was no stenosis. There was no regurgitation. Valve area   (VTI): 2.8 cm^2. Valve area (Vmax): 2.67 cm^2. Valve area   (Vmean): 2.38 cm^2. - Mitral valve: Transvalvular velocity was within the normal range.   There was no evidence for stenosis. There was mild regurgitation. - Left atrium: The atrium was moderately dilated. - Right ventricle: The cavity size was normal. Wall thickness was   normal. Systolic function was normal. - Right atrium: The atrium was mildly dilated. - Atrial septum: No defect or patent foramen ovale was identified. - Tricuspid valve: There was no regurgitation.  MRI Cervical spine : No concerning features of meningeal enhancement or  cord hyperintensity.Ordinary spondylosis, without features of discitis  PHYSICAL EXAM Vitals:   11/26/17 1007 11/26/17 1523 11/26/17 2331 11/27/17 1354  BP: (!) 177/81 (!) 159/68 (!) 172/66 (!) 156/66  Pulse:  87 72 89 80  Resp:      Temp:  98.6 F (37 C) 99 F (37.2 C) 98.4 F (36.9 C)  TempSrc:  Oral Oral Oral  SpO2:  93% 98% 93%  Weight:      Height:        General - Well nourished, well developed, still obtunded and not following commands.  Ophthalmologic - fundi not visualized due to noncooperation.  Cardiovascular - Regular rate and rhythm.  Neuro - obtunded, not following commands. Eyes closed, but able to open with voice. Nonverbal, not able to name or repeat,   following only few occasional commands. Inconsistently blinking to visual threat bilaterally. Eyes mainly natural position, not movement bilaterally as command, doll's eye present. PREEL. Positive corneal and gag. LUE and LLE move spontaneously and against gravity, RUE 2/5 and RLE 3/5 on pain. DTR 1+ and not cooperative on babinski. Sensation, coordination and gait not tested.   ASSESSMENT/PLAN Mr. Terry Bolotin is a 83 y.o. male with history of DM, HTN, MI s/p CABG, PVD, and HLD on lovastatin admitted for AMS, confusion, aphasia, wax and waning process. TPA given at OSH.    AMS with intermittent fever - Seizure with Todd paralysis vs. CNS infection -meningoencephalitis presentation concerning for seizure like activity and CSF concerning for viral meningitis    Resultant AMS, nonverbal, agitation, left hemiparesis  MRI  No acute stroke  MRI brain repeat with and without contrast negative  CTA head and neck b/l ICA proximal and right VA origin athero, left VA hypoplastic  2D Echo EF 50-55%  EEG - diffuse slowing no seizure  TSH/B12 and ammonia WNL  LP showed elevated RBC, WBC and protein, concerning for viral meningitis. ID consulted  Blood culture - no growth day 1  Repeat EEG - see above  LDL  47  HgbA1c 7.3  SCDs for VTE prophylaxis  Diet - low sodium heart healthy   aspirin 81 mg daily prior to admission, now on No antithrombotic  Therapy recommendations:  pending  Disposition:  Pending  ? pneumonia   CXR showed new small area of infiltration left lung base  Repeat CXR - no significant change - possible pneumonia - not currently on antibiotics.  Has cough with secretion  Intermittent fever  Diabetes  HgbA1c 7.3 goal < 7.0  Uncontrolled  Home meds -glyburide  CBG monitoring  SSI  Hypertension Stable  Long term BP goal normotensive  Hyperlipidemia  Home meds:  lovastatin   LDL 47, goal < 70  Resume once po access  Continue statin at discharge  Other Stroke Risk Factors  Advanced age  CAD/MI s/p CABG  PVD   Seizure Activity  Now on Depacon - 500 mg IV every 8 hours - level optimal at 71  EEGs 2 - abnormal as noted above   Other Active Problems  Tmax - 100.9 -> 98.2 Saturday  Multiple LEFT upper lobe pulmonary nodules including 10 mm spiculated nodule. Contrast-enhanced CT chest on a nonemergent basis to assess for extent of involvement recommended.  Possible pneumonia - consider antibiotics will call CCM/medical hospitalist team to consult and help manage  Tube feeds started, nutrition consulted  Hospital day # 8   Continue antibiotics for treatment for pneumonia and panda tube for feeding. Patient is likely going to need a PEG tube due to significant dysarthria and dysphagia swallowing function is not going to improve soon. Long discussion of the bedside with the patient's wife   as well as with palliative  care team nurse practitioner  about goals of care and answered questions.Will check  paraneoplastic antibody panel given evidence of pulmonary nodules on CT chest. Discussed with Dr. Isidoro Donningai. Family plan to meet later today and make a decision on goals of care Greater than 50% time during this 25 minute visit was spent on  counseling and coordination of care about his neurological presentation plan for treatment and evaluation answering questions.  Delia HeadyPramod Sethi, MD Medical Director [facility] Pager: [phone] 11/27/2017 2:59 PM   To contact Stroke Continuity provider, please refer to [email or web address]. After hours, contact General Neurology

## 2017-11-27 NOTE — Discharge Summary (Signed)
Physician Discharge Summary  Peter Becker ZOX:[id number] DOB: 05/22/1929 DOA: 11/19/2017  PCP: Danella Penton, MD  Admit date: 11/19/2017 Discharge date: 11/27/2017  Admitted From: Home Disposition: [facility]  Recommendations for Outpatient Follow-up:  Follow up Care Per Hospice Protocol  Home Health: No Equipment/Devices: None  Discharge Condition: Guarded CODE STATUS: DO NOT RESUSCITATE  Diet recommendation:   Brief/Interim Summary: Peter Becker an 83 y.o.malewith a past medical history of diabetes, hypertension, peripheral arterial disease, and coronary artery disease who was initially admitted for CVA evaluation after onset of weakness and aphasia at 2pm on 12/19. Pt has had continued weakness and aphasia. MRI showed no evidence of acute stroke.Pt was been seen by Infectious disease for LP due to possible concern for meningitis. Pt had a chest xray today that shows progressive left lower lobe pneumonia.[facility] medicine service was consulted for coughing, left lower lobe pneumonia.On 12/24, Dr. Pearlean Brownie requested to [facility] to assume care from 12/25 given multiple medical problems. Because patient failed to progress Palliative Care Medicine was consulted and after discussion with wife a decision was made to pursue comfort measures. Patient to go to [facility] for [facility] Life [facility].   Discharge Diagnoses:  Active Problems:   Ischemic stroke (HCC)   Altered mental status   Cough   Meningoencephalitis   Seizure (HCC)   FUO (fever of unknown origin)   TB lung, latent   Benign essential HTN   Diabetes mellitus type 2 in nonobese (HCC)   Acute blood loss anemia   Pressure injury of skin   DNR (do not resuscitate)   Palliative care by specialist   Dysphagia   Weakness generalized   Aspiration pneumonia (HCC)   Adult failure to thrive syndrome  Acute Neuro Deficits with aphasia, dysphagia -Management was per Neurology.  TPA given at OSH      -MRI showed no acute  stroke, MRI brain repeat with and without contrast also negative, -CTA head and neck showed bilateral ICA proximal and right VA origin atherosclerosis, left would be a hypoplastic -2D echo showed EF of 50-55%, EEG showed diffuse slowing but no seizures. -LDL 47, hemoglobin A1c 7.3. -Patient on aspirin 81 mg daily PTA, started back on aspirin via tube.  -Patient now Comfort Care so Cortrak removed as shift is toward more Comfort -C/w Robinul, Lorazepam, Morphine for Comfort  Dysphagia -Was on tube feeds and now D/C'd -Palliative care following, requesting goals of care -Patient now Comfort Measures and going to [facility]  Altered Mental Status with intermittent Fevers-seizure with Todd's paralysis versus viral meningitis -Possibly due to acute stroke, seizure, meningoencephalitis -Currently remains nonverbal  -Patient underwent lumbar puncture during hospitalization which showed elevated RBC, WBC, protein concerning for viral meningitis. ID was consulted, placed on acyclovir -Goals of Care now transitioning to Comfort Care and going to [facility]   HCAP -Possibly has a component of aspiration, on tube feeds, secretions with coughing -Patient was placed on IV broad-spectrum antibiotics including vancomycin and cefepime. -De-escalated antibiotics to IV cefepime -CT chest showed consolidation in the left lower lobe compatible with pneumonia, small left pleural effusion, numerous small scattered pulmonary nodules 5 mm or less in size, stable since prior studies in 2016, benign -Patient now Comfort Measures and going to [facility]  Seizure (HCC) -EEG showed diffuse slowing no seizures, ammonia within normal limits, TSH, B12 normal -Currently on Depakote -Follow up with Hospice Protocol   Benign essential HTN -Currently stable  Diabetes mellitus type 2 in nonobese (HCC) -D/C'd  Feedings and Insulin -Patient going to [facility]   Discharge  Instructions Discharge Instructions    Call MD for:  difficulty breathing, headache or visual disturbances   Complete by:  As directed    Call MD for:  extreme fatigue   Complete by:  As directed    Call MD for:  hives   Complete by:  As directed    Call MD for:  persistant dizziness or light-headedness   Complete by:  As directed    Call MD for:  persistant nausea and vomiting   Complete by:  As directed    Call MD for:  redness, tenderness, or signs of infection (pain, swelling, redness, odor or green/yellow discharge around incision site)   Complete by:  As directed    Call MD for:  severe uncontrolled pain   Complete by:  As directed    Call MD for:  temperature >100.4   Complete by:  As directed    Diet - low sodium heart healthy   Complete by:  As directed    Discharge instructions   Complete by:  As directed    Follow up Care per Hospice Facility Protocol   Increase activity slowly   Complete by:  As directed      Allergies as of 11/27/2017      Reactions   Tape Other (See Comments)   SKIN IS VERY THIN AND TEARS AND BRUISES EASILY; Please use an alternative!!      Medication List    STOP taking these medications   aspirin 81 MG chewable tablet   CALCIUM 600+D3 PLUS MINERALS PO   chlorpheniramine 4 MG tablet Commonly known as:  CHLOR-TRIMETON   cyanocobalamin 1000 MCG/ML injection Commonly known as:  (VITAMIN B-12)   FISH OIL PO   fluticasone 50 MCG/ACT nasal spray Commonly known as:  FLONASE   glyBURIDE-metformin 2.5-500 MG tablet Commonly known as:  GLUCOVANCE   losartan 50 MG tablet Commonly known as:  COZAAR   lovastatin 20 MG tablet Commonly known as:  MEVACOR   meloxicam 7.5 MG tablet Commonly known as:  MOBIC   metoprolol tartrate 25 MG tablet Commonly known as:  LOPRESSOR   MULTI-VITAMINS Tabs   OCUVITE EXTRA PO   traMADol 50 MG tablet Commonly known as:  ULTRAM   Vitamin D3 2000 units capsule   vitamin E 400 UNIT capsule      TAKE these medications   acetaminophen 650 MG suppository Commonly known as:  TYLENOL Place 1 suppository (650 mg total) rectally every 6 (six) hours as needed for fever.   chlorhexidine 0.12 % solution Commonly known as:  PERIDEX 15 mLs by Mouth Rinse route 2 (two) times daily.   glycopyrrolate 0.2 MG/ML injection Commonly known as:  ROBINUL Inject 2 mLs (0.4 mg total) into the vein 3 (three) times daily.   ipratropium-albuterol 0.5-2.5 (3) MG/3ML Soln Commonly known as:  DUONEB Take 3 mLs by nebulization every 4 (four) hours as needed.   LORazepam 2 MG/ML injection Commonly known as:  ATIVAN Inject 0.5 mLs (1 mg total) into the vein every 4 (four) hours as needed for anxiety.   morphine 2 MG/ML injection Inject 1 mL (2 mg total) into the vein every 2 (two) hours as needed (dyspnea).   sodium chloride 0.9 % SOLN 100 mL with valproate 500 MG/5ML SOLN 500 mg Inject 500 mg into the vein every 8 (eight) hours.       Allergies  Allergen Reactions  . Tape  Other (See Comments)    SKIN IS VERY THIN AND TEARS AND BRUISES EASILY; Please use an alternative!!   Consultations:  Neurology  Infectious Diseases   Palliative Care Medicine  Procedures/Studies: Ct Angio Head W Or Wo Contrast  Result Date: 11/19/2017 CLINICAL DATA:  Acute onset dizziness and aphasia at 1400 hours. RIGHT-sided weakness. Assess stroke. EXAM: CT ANGIOGRAPHY HEAD AND NECK CT PERFUSION BRAIN TECHNIQUE: Multidetector CT imaging of the head and neck was performed using the standard protocol during bolus administration of intravenous contrast. Multiplanar CT image reconstructions and MIPs were obtained to evaluate the vascular anatomy. Carotid stenosis measurements (when applicable) are obtained utilizing NASCET criteria, using the distal internal carotid diameter as the denominator. Multiphase CT imaging of the brain was performed following IV bolus contrast injection. Subsequent parametric perfusion maps were  calculated using RAPID software. CONTRAST:  ISOVUE-370 IOPAMIDOL (ISOVUE-370) INJECTION 76% COMPARISON:  CT HEAD November 19, 2017 at 1627 hours FINDINGS: CT HEAD FINDINGS BRAIN: No intraparenchymal hemorrhage, mass effect nor midline shift. The ventricles and sulci are normal for age. Patchy supratentorial white matter hypodensities within normal range for patient's age, though non-specific are most compatible with chronic small vessel ischemic disease. Punctate LEFT frontal lobe calcification. Old RIGHT basal ganglia lacunar infarct. No acute large vascular territory infarcts. No abnormal extra-axial fluid collections. Basal cisterns are patent. VASCULAR: Moderate calcific atherosclerosis of the carotid siphons in RIGHT vertebral artery. SKULL: No skull fracture. No significant scalp soft tissue swelling. SINUSES/ORBITS: Mild lobulated paranasal sinus mucosal thickening without air-fluid levels. Mastoid air cells are well aerated.The included ocular globes and orbital contents are non-suspicious. Status post bilateral ocular lens implants. OTHER: None. CTA NECK AORTIC ARCH: Normal appearance of the thoracic arch, normal branch pattern. Moderate calcific atherosclerosis aortic arch. The origins of the innominate, left Common carotid artery and subclavian artery are widely patent. RIGHT CAROTID SYSTEM: Common carotid artery is widely patent, mild calcific atherosclerosis. Moderate calcific atherosclerosis carotid bifurcation without hemodynamically significant stenosis by NASCET criteria. Normal appearance of the internal carotid artery. LEFT CAROTID SYSTEM: Common carotid artery is widely patent, mild calcific atherosclerosis. Moderate calcific atherosclerosis without hemodynamically significant stenosis by NASCET criteria. Focal luminal irregularity LEFT internal carotid artery origin, favoring sequelae of atherosclerosis, less likely pseudo aneurysm. VERTEBRAL ARTERIES:RIGHT vertebral artery is dominant.  Calcific atherosclerosis resulting in severe stenosis RIGHT vertebral artery. The severe stenosis versus occluded LEFT P1 origin with immediate reconstitution, at diminutive thready vessel. SKELETON: No acute osseous process though bone windows have not been submitted. Patient is edentulous. Multilevel moderate to severe RIGHT cervical facet arthropathy. OTHER NECK: Soft tissues of the neck are nonacute though, not tailored for evaluation. UPPER CHEST: Tiny scattered centrilobular ground-glass nodules most compatible with respiratory bronchiolitis. Centrilobular emphysema. Multiple LEFT upper lobe pulmonary nodules including 10 mm spiculated nodule (series 15, image 221/280). LEFT upper lobe calcified granuloma. No superior mediastinal lymphadenopathy. OTHER: None. CTA HEAD ANTERIOR CIRCULATION: Patent cervical internal carotid arteries, petrous, cavernous and supra clinoid internal carotid arteries. Atherosclerosis resulting in mild stenosis RIGHT supraclinoid internal carotid artery. Patent anterior communicating artery. Patent anterior and middle cerebral arteries. No large vessel occlusion, significant stenosis, contrast extravasation or aneurysm. POSTERIOR CIRCULATION: Patent RIGHT vertebral artery, vertebrobasilar junction and basilar artery, as well as main branch vessels. Extremely diminutive LEFT vertebral artery, with tandem severe stenosis versus occlusion and reconstitution. Bilateral posterior inferior cerebellar arteries are patent. Small LEFT P1 segment, robust LEFT posterior communicating artery. Patent posterior cerebral arteries. No large vessel occlusion, significant stenosis, contrast  extravasation or aneurysm. VENOUS SINUSES: Major dural venous sinuses are patent though not tailored for evaluation on this angiographic examination. ANATOMIC VARIANTS: None. DELAYED PHASE: Not performed. MIP images reviewed. CT Brain Perfusion Findings: CBF (<30%) Volume: 0mL Perfusion (Tmax>6.0s) volume: 9mL  Mismatch Volume: 9mL Infarction Location:LEFT posterior parietal lobe. Generalized prolonged T-max LEFT MCA and posterior watershed territory with preserved flow, potentially from ICA stenosis though, preserved LEFT ACA profusion. IMPRESSION: CT HEAD: 1. No acute intracranial process. 2. Old RIGHT basal ganglia lacunar infarct, otherwise negative noncontrast CT HEAD for age. CTA NECK: 1. Atherosclerosis without hemodynamically significant stenosis. 2. Severe stenosis versus occluded LEFT vertebral artery origin with immediate reconstitution, diminutive LEFT vertebral artery. 3. **An incidental finding of potential clinical significance has been found. Multiple LEFT upper lobe pulmonary nodules including 10 mm spiculated nodule. Recommend contrast-enhanced CT chest on a nonemergent basis to assess for extent of involvement. ** CTA HEAD: 1. No emergent large vessel occlusion or severe stenosis anterior circulation. 2. Severe stenosis versus tandem occlusion diminutive LEFT vertebral artery. RIGHT vertebral artery is dominant. CT PERFUSION: 1. Small perfusion defect LEFT parietal lobe/posterior watershed territory suggesting brain at risk. Acute findings discussed with and reconfirmed by Dr.Lindzen, Neurology on 11/19/2017 at 6:35 pm. Aortic Atherosclerosis (ICD10-I70.0) and Emphysema (ICD10-J43.9). Electronically Signed   By: Awilda Metro M.D.   On: 11/19/2017 18:36   Dg Chest 2 View  Result Date: 11/23/2017 CLINICAL DATA:  History of cerebral infarction.  Pneumonia. EXAM: CHEST  2 VIEW COMPARISON:  11/21/2017 FINDINGS: Stable mild cardiac enlargement. Feeding tube present extending below the diaphragm. Lungs show progressive airspace disease and consolidation of the left lower lobe consistent with pneumonia. There may be some associated left pleural fluid. No edema or pneumothorax. IMPRESSION: Progressive left lower lobe pneumonia. There may be some associated left pleural fluid. Electronically Signed   By:  Irish Lack M.D.   On: 11/23/2017 11:13   Ct Angio Neck W Or Wo Contrast  Result Date: 11/19/2017 CLINICAL DATA:  Acute onset dizziness and aphasia at 1400 hours. RIGHT-sided weakness. Assess stroke. EXAM: CT ANGIOGRAPHY HEAD AND NECK CT PERFUSION BRAIN TECHNIQUE: Multidetector CT imaging of the head and neck was performed using the standard protocol during bolus administration of intravenous contrast. Multiplanar CT image reconstructions and MIPs were obtained to evaluate the vascular anatomy. Carotid stenosis measurements (when applicable) are obtained utilizing NASCET criteria, using the distal internal carotid diameter as the denominator. Multiphase CT imaging of the brain was performed following IV bolus contrast injection. Subsequent parametric perfusion maps were calculated using RAPID software. CONTRAST:  ISOVUE-370 IOPAMIDOL (ISOVUE-370) INJECTION 76% COMPARISON:  CT HEAD November 19, 2017 at 1627 hours FINDINGS: CT HEAD FINDINGS BRAIN: No intraparenchymal hemorrhage, mass effect nor midline shift. The ventricles and sulci are normal for age. Patchy supratentorial white matter hypodensities within normal range for patient's age, though non-specific are most compatible with chronic small vessel ischemic disease. Punctate LEFT frontal lobe calcification. Old RIGHT basal ganglia lacunar infarct. No acute large vascular territory infarcts. No abnormal extra-axial fluid collections. Basal cisterns are patent. VASCULAR: Moderate calcific atherosclerosis of the carotid siphons in RIGHT vertebral artery. SKULL: No skull fracture. No significant scalp soft tissue swelling. SINUSES/ORBITS: Mild lobulated paranasal sinus mucosal thickening without air-fluid levels. Mastoid air cells are well aerated.The included ocular globes and orbital contents are non-suspicious. Status post bilateral ocular lens implants. OTHER: None. CTA NECK AORTIC ARCH: Normal appearance of the thoracic arch, normal branch  pattern. Moderate calcific atherosclerosis aortic arch. The  origins of the innominate, left Common carotid artery and subclavian artery are widely patent. RIGHT CAROTID SYSTEM: Common carotid artery is widely patent, mild calcific atherosclerosis. Moderate calcific atherosclerosis carotid bifurcation without hemodynamically significant stenosis by NASCET criteria. Normal appearance of the internal carotid artery. LEFT CAROTID SYSTEM: Common carotid artery is widely patent, mild calcific atherosclerosis. Moderate calcific atherosclerosis without hemodynamically significant stenosis by NASCET criteria. Focal luminal irregularity LEFT internal carotid artery origin, favoring sequelae of atherosclerosis, less likely pseudo aneurysm. VERTEBRAL ARTERIES:RIGHT vertebral artery is dominant. Calcific atherosclerosis resulting in severe stenosis RIGHT vertebral artery. The severe stenosis versus occluded LEFT P1 origin with immediate reconstitution, at diminutive thready vessel. SKELETON: No acute osseous process though bone windows have not been submitted. Patient is edentulous. Multilevel moderate to severe RIGHT cervical facet arthropathy. OTHER NECK: Soft tissues of the neck are nonacute though, not tailored for evaluation. UPPER CHEST: Tiny scattered centrilobular ground-glass nodules most compatible with respiratory bronchiolitis. Centrilobular emphysema. Multiple LEFT upper lobe pulmonary nodules including 10 mm spiculated nodule (series 15, image 221/280). LEFT upper lobe calcified granuloma. No superior mediastinal lymphadenopathy. OTHER: None. CTA HEAD ANTERIOR CIRCULATION: Patent cervical internal carotid arteries, petrous, cavernous and supra clinoid internal carotid arteries. Atherosclerosis resulting in mild stenosis RIGHT supraclinoid internal carotid artery. Patent anterior communicating artery. Patent anterior and middle cerebral arteries. No large vessel occlusion, significant stenosis, contrast  extravasation or aneurysm. POSTERIOR CIRCULATION: Patent RIGHT vertebral artery, vertebrobasilar junction and basilar artery, as well as main branch vessels. Extremely diminutive LEFT vertebral artery, with tandem severe stenosis versus occlusion and reconstitution. Bilateral posterior inferior cerebellar arteries are patent. Small LEFT P1 segment, robust LEFT posterior communicating artery. Patent posterior cerebral arteries. No large vessel occlusion, significant stenosis, contrast extravasation or aneurysm. VENOUS SINUSES: Major dural venous sinuses are patent though not tailored for evaluation on this angiographic examination. ANATOMIC VARIANTS: None. DELAYED PHASE: Not performed. MIP images reviewed. CT Brain Perfusion Findings: CBF (<30%) Volume: 0mL Perfusion (Tmax>6.0s) volume: 9mL Mismatch Volume: 9mL Infarction Location:LEFT posterior parietal lobe. Generalized prolonged T-max LEFT MCA and posterior watershed territory with preserved flow, potentially from ICA stenosis though, preserved LEFT ACA profusion. IMPRESSION: CT HEAD: 1. No acute intracranial process. 2. Old RIGHT basal ganglia lacunar infarct, otherwise negative noncontrast CT HEAD for age. CTA NECK: 1. Atherosclerosis without hemodynamically significant stenosis. 2. Severe stenosis versus occluded LEFT vertebral artery origin with immediate reconstitution, diminutive LEFT vertebral artery. 3. **An incidental finding of potential clinical significance has been found. Multiple LEFT upper lobe pulmonary nodules including 10 mm spiculated nodule. Recommend contrast-enhanced CT chest on a nonemergent basis to assess for extent of involvement. ** CTA HEAD: 1. No emergent large vessel occlusion or severe stenosis anterior circulation. 2. Severe stenosis versus tandem occlusion diminutive LEFT vertebral artery. RIGHT vertebral artery is dominant. CT PERFUSION: 1. Small perfusion defect LEFT parietal lobe/posterior watershed territory suggesting brain at  risk. Acute findings discussed with and reconfirmed by Dr.Lindzen, Neurology on 11/19/2017 at 6:35 pm. Aortic Atherosclerosis (ICD10-I70.0) and Emphysema (ICD10-J43.9). Electronically Signed   By: Awilda Metro M.D.   On: 11/19/2017 18:36   Ct Chest W Contrast  Result Date: 11/23/2017 CLINICAL DATA:  Pneumonia EXAM: CT CHEST WITH CONTRAST TECHNIQUE: Multidetector CT imaging of the chest was performed during intravenous contrast administration. CONTRAST:  75mL ISOVUE-300 IOPAMIDOL (ISOVUE-300) INJECTION 61% COMPARISON:  11/23/2017. Chest CT 09/22/2017, remote films dating back to 07/17/2015. FINDINGS: Cardiovascular: Prior CABG. Cardiomegaly. Moderate aortic calcifications. No aneurysm or adenopathy. Mediastinum/Nodes: No mediastinal, hilar, or axillary adenopathy. Lungs/Pleura: Small  left pleural effusion. Consolidation in the left lower lobe compatible with pneumonia. No central obstructing mass or endobronchial lesion. Clustered nodules in the right middle lobe, the largest 5 mm. Small nodules in the lingula on image 71, 5 mm or less. Numerous scattered similarly sized nodules in both lungs. Upper Abdomen: Imaging into the upper abdomen shows no acute findings. Musculoskeletal: Chest wall soft tissues are unremarkable. No acute bony abnormality. IMPRESSION: Consolidation in the left lower lobe compatible with pneumonia. Small left pleural effusion. Numerous small scattered pulmonary nodules, 5 mm or less in size. These are stable since prior studies dating back to 2016 compatible with benign nodules. Cardiomegaly.  Prior CABG. Aortic Atherosclerosis (ICD10-I70.0). Electronically Signed   By: Charlett NoseKevin  Dover M.D.   On: 11/23/2017 17:28   Mr Brain Wo Contrast  Result Date: 11/20/2017 CLINICAL DATA:  Acute onset aphasia EXAM: MRI HEAD WITHOUT CONTRAST TECHNIQUE: Multiplanar, multiecho pulse sequences of the brain and surrounding structures were obtained without intravenous contrast. COMPARISON:  Head CT  11/19/2017 FINDINGS: Brain: The midline structures are normal. There is no acute infarct or acute hemorrhage. No mass lesion, hydrocephalus, dural abnormality or extra-axial collection. Old right corona radiata lacunar infarct. There is multifocal periventricular leukoaraiosis, most commonly seen in the setting of chronic ischemic microangiopathy. No age-advanced or lobar predominant atrophy. No chronic microhemorrhage or superficial siderosis. Vascular: There is loss of the normal left vertebral artery flow void. The other major intracranial flow voids of the skullbase are preserved. Skull and upper cervical spine: The visualized skull base, calvarium, upper cervical spine and extracranial soft tissues are normal. Sinuses/Orbits: Small right mastoid effusion. Paranasal sinuses are clear. Normal orbits. IMPRESSION: 1. No acute intracranial abnormality. 2. Chronic microvascular ischemia and old right corona radiata lacunar infarct. 3. Loss of the normal left vertebral artery flow void, consistent with the severely diminutive left vertebral artery demonstrated on the earlier CTA. Electronically Signed   By: Deatra RobinsonKevin  Herman M.D.   On: 11/20/2017 04:16   Mr Laqueta JeanBrain W ZOWo Contrast  Result Date: 11/20/2017 CLINICAL DATA:  Initial evaluation for follow-up stroke, status post tPA. EXAM: MRI HEAD WITHOUT AND WITH CONTRAST TECHNIQUE: Multiplanar, multiecho pulse sequences of the brain and surrounding structures were obtained without and with intravenous contrast. CONTRAST:  10mL MULTIHANCE GADOBENATE DIMEGLUMINE 529 MG/ML IV SOLN COMPARISON:  Prior CT from 11/19/2017 as well as previous MRI from earlier the same day. FINDINGS: Brain: Generalized age related cerebral atrophy. Patchy confluent T2/FLAIR hyperintensity within the periventricular white matter, most consistent with chronic small vascular disease. Remote lacunar infarct again noted within the anterior right corona radiata. No abnormal foci of restricted diffusion to  suggest acute or subacute ischemia. Gray-white matter differentiation maintained. No evidence for acute intracranial hemorrhage status post tPA administration. No mass lesion, midline shift or mass effect. Ventricles stable in size without hydrocephalus. No extra-axial fluid collection. Major dural sinuses are grossly patent. No abnormal enhancement. Apparent irregular enhancement within the right cerebellar hemisphere on axial post-contrast imaging felt to be most consistent with artifact, and is not seen on corresponding coronal postcontrast sequence. No imaging findings to suggest acute CNS infection. Pituitary and suprasellar region grossly normal. Vascular: Hypoplastic left vertebral artery again noted. Major intravascular flow voids otherwise maintained. Skull and upper cervical spine: Craniocervical junction grossly normal. Bone marrow signal intensity within normal limits. No appreciable scalp soft tissue abnormality. Sinuses/Orbits: Globes and oval soft tissues within normal limits. Patient status post lens extraction bilaterally. Scattered mucosal thickening noted within the ethmoidal air  cells and maxillary sinuses. Paranasal sinuses are otherwise clear. No air-fluid level to suggest acute sinusitis. Right mastoid effusion noted, stable. Inner ear structures normal. Other: None. IMPRESSION: 1. No acute intracranial abnormality identified. No evidence for acute infarct or hemorrhage status post tPA administration. No imaging findings to suggest acute CNS infection. 2. Stable atrophy with chronic small vessel ischemic disease with old right corona radiata lacunar infarct. 3. Right mastoid effusion. Electronically Signed   By: Rise Mu M.D.   On: 11/20/2017 18:36   Mr Cervical Spine W Wo Contrast  Result Date: 11/22/2017 CLINICAL DATA:  LP concerning for viral meningitis. Patient nonverbal, and not following commands. RIGHT-sided weakness. EXAM: MRI CERVICAL SPINE WITHOUT AND WITH CONTRAST  TECHNIQUE: Multiplanar and multiecho pulse sequences of the cervical spine, to include the craniocervical junction and cervicothoracic junction, were obtained without and with intravenous contrast. CONTRAST:  10mL MULTIHANCE GADOBENATE DIMEGLUMINE 529 MG/ML IV SOLN COMPARISON:  MRI brain 11/20/2017. FINDINGS: Alignment: Physiologic. Vertebrae: No fracture, evidence of discitis, or bone lesion. Cord: Normal signal and morphology. Posterior Fossa, vertebral arteries, paraspinal tissues: Negative. Disc levels: C2-3:  Normal. C3-4:  Normal.  Facet arthropathy. C4-5:  Normal.  Facet arthropathy. C5-6: Shallow central protrusion. Mild stenosis. Minimal cord flattening. No definite foraminal narrowing. C6-7: Disc space narrowing. Mild stenosis. Minimal cord flattening. RIGHT-sided foraminal narrowing due to uncinate spurring. C7-T1:  Unremarkable. Post infusion imaging does not demonstrate enhancement of the vertebral bodies, or intraspinal contents. No paravertebral focus of infection is evident. IMPRESSION: No concerning features of meningeal enhancement or cord hyperintensity. Ordinary spondylosis, without features of discitis. See discussion above. Electronically Signed   By: Elsie Stain M.D.   On: 11/22/2017 19:06   Ct Cerebral Perfusion W Contrast  Result Date: 11/19/2017 CLINICAL DATA:  Acute onset dizziness and aphasia at 1400 hours. RIGHT-sided weakness. Assess stroke. EXAM: CT ANGIOGRAPHY HEAD AND NECK CT PERFUSION BRAIN TECHNIQUE: Multidetector CT imaging of the head and neck was performed using the standard protocol during bolus administration of intravenous contrast. Multiplanar CT image reconstructions and MIPs were obtained to evaluate the vascular anatomy. Carotid stenosis measurements (when applicable) are obtained utilizing NASCET criteria, using the distal internal carotid diameter as the denominator. Multiphase CT imaging of the brain was performed following IV bolus contrast injection.  Subsequent parametric perfusion maps were calculated using RAPID software. CONTRAST:  ISOVUE-370 IOPAMIDOL (ISOVUE-370) INJECTION 76% COMPARISON:  CT HEAD November 19, 2017 at 1627 hours FINDINGS: CT HEAD FINDINGS BRAIN: No intraparenchymal hemorrhage, mass effect nor midline shift. The ventricles and sulci are normal for age. Patchy supratentorial white matter hypodensities within normal range for patient's age, though non-specific are most compatible with chronic small vessel ischemic disease. Punctate LEFT frontal lobe calcification. Old RIGHT basal ganglia lacunar infarct. No acute large vascular territory infarcts. No abnormal extra-axial fluid collections. Basal cisterns are patent. VASCULAR: Moderate calcific atherosclerosis of the carotid siphons in RIGHT vertebral artery. SKULL: No skull fracture. No significant scalp soft tissue swelling. SINUSES/ORBITS: Mild lobulated paranasal sinus mucosal thickening without air-fluid levels. Mastoid air cells are well aerated.The included ocular globes and orbital contents are non-suspicious. Status post bilateral ocular lens implants. OTHER: None. CTA NECK AORTIC ARCH: Normal appearance of the thoracic arch, normal branch pattern. Moderate calcific atherosclerosis aortic arch. The origins of the innominate, left Common carotid artery and subclavian artery are widely patent. RIGHT CAROTID SYSTEM: Common carotid artery is widely patent, mild calcific atherosclerosis. Moderate calcific atherosclerosis carotid bifurcation without hemodynamically significant stenosis by NASCET criteria.  Normal appearance of the internal carotid artery. LEFT CAROTID SYSTEM: Common carotid artery is widely patent, mild calcific atherosclerosis. Moderate calcific atherosclerosis without hemodynamically significant stenosis by NASCET criteria. Focal luminal irregularity LEFT internal carotid artery origin, favoring sequelae of atherosclerosis, less likely pseudo aneurysm. VERTEBRAL  ARTERIES:RIGHT vertebral artery is dominant. Calcific atherosclerosis resulting in severe stenosis RIGHT vertebral artery. The severe stenosis versus occluded LEFT P1 origin with immediate reconstitution, at diminutive thready vessel. SKELETON: No acute osseous process though bone windows have not been submitted. Patient is edentulous. Multilevel moderate to severe RIGHT cervical facet arthropathy. OTHER NECK: Soft tissues of the neck are nonacute though, not tailored for evaluation. UPPER CHEST: Tiny scattered centrilobular ground-glass nodules most compatible with respiratory bronchiolitis. Centrilobular emphysema. Multiple LEFT upper lobe pulmonary nodules including 10 mm spiculated nodule (series 15, image 221/[id number]). LEFT upper lobe calcified granuloma. No superior mediastinal lymphadenopathy. OTHER: None. CTA HEAD ANTERIOR CIRCULATION: Patent cervical internal carotid arteries, petrous, cavernous and supra clinoid internal carotid arteries. Atherosclerosis resulting in mild stenosis RIGHT supraclinoid internal carotid artery. Patent anterior communicating artery. Patent anterior and middle cerebral arteries. No large vessel occlusion, significant stenosis, contrast extravasation or aneurysm. POSTERIOR CIRCULATION: Patent RIGHT vertebral artery, vertebrobasilar junction and basilar artery, as well as main branch vessels. Extremely diminutive LEFT vertebral artery, with tandem severe stenosis versus occlusion and reconstitution. Bilateral posterior inferior cerebellar arteries are patent. Small LEFT P1 segment, robust LEFT posterior communicating artery. Patent posterior cerebral arteries. No large vessel occlusion, significant stenosis, contrast extravasation or aneurysm. VENOUS SINUSES: Major dural venous sinuses are patent though not tailored for evaluation on this angiographic examination. ANATOMIC VARIANTS: None. DELAYED PHASE: Not performed. MIP images reviewed. CT Brain Perfusion Findings: CBF (<30%)  Volume: 0mL Perfusion (Tmax>6.0s) volume: 9mL Mismatch Volume: 9mL Infarction Location:LEFT posterior parietal lobe. Generalized prolonged T-max LEFT MCA and posterior watershed territory with preserved flow, potentially from ICA stenosis though, preserved LEFT ACA profusion. IMPRESSION: CT HEAD: 1. No acute intracranial process. 2. Old RIGHT basal ganglia lacunar infarct, otherwise negative noncontrast CT HEAD for age. CTA NECK: 1. Atherosclerosis without hemodynamically significant stenosis. 2. Severe stenosis versus occluded LEFT vertebral artery origin with immediate reconstitution, diminutive LEFT vertebral artery. 3. **An incidental finding of potential clinical significance has been found. Multiple LEFT upper lobe pulmonary nodules including 10 mm spiculated nodule. Recommend contrast-enhanced CT chest on a nonemergent basis to assess for extent of involvement. ** CTA HEAD: 1. No emergent large vessel occlusion or severe stenosis anterior circulation. 2. Severe stenosis versus tandem occlusion diminutive LEFT vertebral artery. RIGHT vertebral artery is dominant. CT PERFUSION: 1. Small perfusion defect LEFT parietal lobe/posterior watershed territory suggesting brain at risk. Acute findings discussed with and reconfirmed by Dr.Lindzen, Neurology on 11/19/2017 at 6:35 pm. Aortic Atherosclerosis (ICD10-I70.0) and Emphysema (ICD10-J43.9). Electronically Signed   By: Awilda Metro M.D.   On: 11/19/2017 18:36   Dg Chest Port 1 View  Result Date: 11/21/2017 CLINICAL DATA:  Coughing. EXAM: PORTABLE CHEST 1 VIEW COMPARISON:  11/20/2017 FINDINGS: Stable changes from prior cardiac surgery. Cardiac silhouette is mildly enlarged. No mediastinal or hilar masses. Opacities noted at the medial left lung base similar to the prior exam. This may reflect atelectasis or pneumonia. Mild chronic scarring in the left upper lobe. Remainder of the lungs is clear. No pleural effusion or pneumothorax. IMPRESSION: 1. No  significant change from the previous day's study. 2. Left medial lung base opacity is again noted consistent with pneumonia or atelectasis. Electronically Signed   By: Amie Portland  M.D.   On: 11/21/2017 09:30   Dg Chest Port 1 View  Result Date: 11/20/2017 CLINICAL DATA:  Fever. EXAM: PORTABLE CHEST 1 VIEW COMPARISON:  CT scan of the chest dated 09/22/2017 and chest x-ray dated 03/06/2015 FINDINGS: There is a small area of increased density at the left lung base medially, new since the prior exam. There was an area of slight atelectasis at that site on the prior CT scan. Calcified granulomas in the left lung apex, unchanged. The lungs are otherwise clear. Heart size and pulmonary vascularity are normal. CABG. No acute bone abnormality. IMPRESSION: Small area of infiltrate or increased atelectasis in the left lung base, new since the prior CT scan. Electronically Signed   By: Francene Boyers M.D.   On: 11/20/2017 15:17   Ct Head Code Stroke Wo Contrast  Result Date: 11/19/2017 CLINICAL DATA:  Code stroke.  Sudden onset dizziness and aphasia. EXAM: CT HEAD WITHOUT CONTRAST TECHNIQUE: Contiguous axial images were obtained from the base of the skull through the vertex without intravenous contrast. COMPARISON:  None FINDINGS: Brain: A 2 mm punctate density in the left frontal white matter is favored to reflect a calcification. No definite intracranial hemorrhage is identified. There is a chronic lacunar infarct in the right lentiform nucleus. No acute cortically based infarct, mass, midline shift, or extra-axial fluid collection is identified. Periventricular white matter hypodensities are nonspecific but compatible with mild chronic small vessel ischemic disease. Generalized cerebral atrophy is relatively mild for age. Vascular: Calcified atherosclerosis at the skullbase. No hyperdense vessel. Skull: No fracture or focal osseous lesion. Sinuses/Orbits: Mild bilateral ethmoid air cell mucosal thickening. Clear  mastoid air cells. No acute findings in the included orbits. Other: None. ASPECTS [facility] Stroke Program Early CT Score) - Ganglionic level infarction (caudate, lentiform nuclei, internal capsule, insula, M1-M3 cortex): 7 - Supraganglionic infarction (M4-M6 cortex): 3 Total score (0-10 with 10 being normal): 10 IMPRESSION: 1. No evidence of acute intracranial abnormality. 2. ASPECTS is 10. 3. Mild chronic small vessel ischemic disease and cerebral atrophy. Chronic right basal ganglia lacunar infarct. These results were called by telephone at the time of interpretation on 11/19/2017 at 4:40 pm to Dr. Sharman Cheek , who verbally acknowledged these results. Electronically Signed   By: Sebastian Ache M.D.   On: 11/19/2017 16:40   EEG  Impression: This awake and asleep EEG is abnormal due to occasional focal slowing over the left hemisphere, maximal over the left frontocentrotemporal region.  Clinical Correlation of the above findings indicates focal cerebral dysfunction over the left hemisphere region suggestive of underlying structural or physiologic abnormality. The absence of epileptiform discharges does not exclude a clinical diagnosis of epilepsy. Clinical correlation is advised.  Subjective: Seen and examined and appeared weak and unable to cough up his secretions. Wife at bedside. States he rested comfortably this AM.  Discharge Exam: Vitals:   11/26/17 2331 11/27/17 1354  BP: (!) 172/66 (!) 156/66  Pulse: 89 80  Resp:    Temp: 99 F (37.2 C) 98.4 F (36.9 C)  SpO2: 98% 93%   Vitals:   11/26/17 1007 11/26/17 1523 11/26/17 2331 11/27/17 1354  BP: (!) 177/81 (!) 159/68 (!) 172/66 (!) 156/66  Pulse: 87 72 89 80  Resp:      Temp:  98.6 F (37 C) 99 F (37.2 C) 98.4 F (36.9 C)  TempSrc:  Oral Oral Oral  SpO2:  93% 98% 93%  Weight:      Height:  General: Pt is awake but aphasic Cardiovascular: RRR, S1/S2 +, no rubs, no gallops Respiratory: Diminished bilaterally, no  wheezing, no rhonchi; Unlabored breathing  Abdominal: Soft, NT, ND, bowel sounds + Extremities: no edema, no cyanosis  The results of significant diagnostics from this hospitalization (including imaging, microbiology, ancillary and laboratory) are listed below for reference.    Microbiology: Recent Results (from the past 240 hour(s))  MRSA PCR Screening     Status: None   Collection Time: 11/19/17  9:24 PM  Result Value Ref Range Status   MRSA by PCR NEGATIVE NEGATIVE Final    Comment:        The GeneXpert MRSA Assay (FDA approved for NASAL specimens only), is one component of a comprehensive MRSA colonization surveillance program. It is not intended to diagnose MRSA infection nor to guide or monitor treatment for MRSA infections.   Culture, blood (routine x 2)     Status: None   Collection Time: 11/20/17 11:05 AM  Result Value Ref Range Status   Specimen Description BLOOD LEFT ANTECUBITAL  Final   Special Requests IN PEDIATRIC BOTTLE Blood Culture adequate volume  Final   Culture NO GROWTH 5 DAYS  Final   Report Status 11/25/2017 FINAL  Final  Culture, blood (routine x 2)     Status: None   Collection Time: 11/20/17 11:10 AM  Result Value Ref Range Status   Specimen Description BLOOD LEFT ANTECUBITAL  Final   Special Requests IN PEDIATRIC BOTTLE Blood Culture adequate volume  Final   Culture NO GROWTH 5 DAYS  Final   Report Status 11/25/2017 FINAL  Final  CSF culture     Status: None   Collection Time: 11/20/17  6:46 PM  Result Value Ref Range Status   Specimen Description CSF  Final   Special Requests NONE  Final   Gram Stain   Final    WBC PRESENT, PREDOMINANTLY PMN NO ORGANISMS SEEN CYTOSPIN SMEAR    Culture NO GROWTH 3 DAYS  Final   Report Status 11/24/2017 FINAL  Final  Anaerobic culture     Status: None   Collection Time: 11/20/17  6:46 PM  Result Value Ref Range Status   Specimen Description CSF  Final   Special Requests NONE  Final   Culture NO ANAEROBES  ISOLATED  Final   Report Status 11/25/2017 FINAL  Final  Culture, blood (routine x 2) Call MD if unable to obtain prior to antibiotics being given     Status: None (Preliminary result)   Collection Time: 11/23/17  3:35 PM  Result Value Ref Range Status   Specimen Description BLOOD LEFT ANTECUBITAL  Final   Special Requests IN PEDIATRIC BOTTLE Blood Culture adequate volume  Final   Culture NO GROWTH 4 DAYS  Final   Report Status PENDING  Incomplete  Culture, blood (routine x 2) Call MD if unable to obtain prior to antibiotics being given     Status: None (Preliminary result)   Collection Time: 11/23/17  3:41 PM  Result Value Ref Range Status   Specimen Description BLOOD RIGHT ANTECUBITAL  Final   Special Requests   Final    BOTTLES DRAWN AEROBIC ONLY Blood Culture adequate volume   Culture NO GROWTH 4 DAYS  Final   Report Status PENDING  Incomplete    Labs: BNP (last 3 results) No results for input(s): BNP in the last 8760 hours. Basic Metabolic Panel: Recent Labs  Lab 11/21/17 0540 11/22/17 0514 11/23/17 0354 11/24/17 0307  NA 137  138 140 144  K 3.9 3.9 3.4* 3.3*  CL 103 107 109 114*  CO2 23 22 24 24   GLUCOSE 104* 97 195* 158*  BUN 13 15 18 17   CREATININE 1.23 1.17 1.06 0.90  CALCIUM 8.8* 8.3* 8.2* 8.2*   Liver Function Tests: No results for input(s): AST, ALT, ALKPHOS, BILITOT, PROT, ALBUMIN in the last 168 hours. No results for input(s): LIPASE, AMYLASE in the last 168 hours. No results for input(s): AMMONIA in the last 168 hours. CBC: Recent Labs  Lab 11/21/17 0540 11/22/17 0514 11/23/17 0354 11/24/17 0307  WBC 8.3 8.0 8.1 7.5  NEUTROABS  --   --   --  5.6  HGB 11.5* 10.9* 10.8* 10.1*  HCT 34.3* 32.6* 32.0* 29.8*  MCV 91.5 91.8 92.0 92.0  PLT 212 212 203 180   Cardiac Enzymes: Recent Labs  Lab 11/22/17 0514  TROPONINI 0.12*   BNP: Invalid input(s): POCBNP CBG: Recent Labs  Lab 11/25/17 2054 11/26/17 0130 11/26/17 0422 11/26/17 0736  11/26/17 1141  GLUCAP 144* 145* 172* 185* 235*   D-Dimer No results for input(s): DDIMER in the last 72 hours. Hgb A1c No results for input(s): HGBA1C in the last 72 hours. Lipid Profile No results for input(s): CHOL, HDL, LDLCALC, TRIG, CHOLHDL, LDLDIRECT in the last 72 hours. Thyroid function studies No results for input(s): TSH, T4TOTAL, T3FREE, THYROIDAB in the last 72 hours.  Invalid input(s): FREET3 Anemia work up No results for input(s): VITAMINB12, FOLATE, FERRITIN, TIBC, IRON, RETICCTPCT in the last 72 hours. Urinalysis    Component Value Date/Time   COLORURINE YELLOW 11/23/2017 1435   APPEARANCEUR CLEAR 11/23/2017 1435   LABSPEC 1.025 11/23/2017 1435   PHURINE 5.0 11/23/2017 1435   GLUCOSEU 50 (A) 11/23/2017 1435   HGBUR NEGATIVE 11/23/2017 1435   BILIRUBINUR NEGATIVE 11/23/2017 1435   KETONESUR 5 (A) 11/23/2017 1435   PROTEINUR 100 (A) 11/23/2017 1435   NITRITE NEGATIVE 11/23/2017 1435   LEUKOCYTESUR NEGATIVE 11/23/2017 1435   Sepsis Labs Invalid input(s): PROCALCITONIN,  WBC,  LACTICIDVEN Microbiology Recent Results (from the past 240 hour(s))  MRSA PCR Screening     Status: None   Collection Time: 11/19/17  9:24 PM  Result Value Ref Range Status   MRSA by PCR NEGATIVE NEGATIVE Final    Comment:        The GeneXpert MRSA Assay (FDA approved for NASAL specimens only), is one component of a comprehensive MRSA colonization surveillance program. It is not intended to diagnose MRSA infection nor to guide or monitor treatment for MRSA infections.   Culture, blood (routine x 2)     Status: None   Collection Time: 11/20/17 11:05 AM  Result Value Ref Range Status   Specimen Description BLOOD LEFT ANTECUBITAL  Final   Special Requests IN PEDIATRIC BOTTLE Blood Culture adequate volume  Final   Culture NO GROWTH 5 DAYS  Final   Report Status 11/25/2017 FINAL  Final  Culture, blood (routine x 2)     Status: None   Collection Time: 11/20/17 11:10 AM  Result  Value Ref Range Status   Specimen Description BLOOD LEFT ANTECUBITAL  Final   Special Requests IN PEDIATRIC BOTTLE Blood Culture adequate volume  Final   Culture NO GROWTH 5 DAYS  Final   Report Status 11/25/2017 FINAL  Final  CSF culture     Status: None   Collection Time: 11/20/17  6:46 PM  Result Value Ref Range Status   Specimen Description CSF  Final  Special Requests NONE  Final   Gram Stain   Final    WBC PRESENT, PREDOMINANTLY PMN NO ORGANISMS SEEN CYTOSPIN SMEAR    Culture NO GROWTH 3 DAYS  Final   Report Status 11/24/2017 FINAL  Final  Anaerobic culture     Status: None   Collection Time: 11/20/17  6:46 PM  Result Value Ref Range Status   Specimen Description CSF  Final   Special Requests NONE  Final   Culture NO ANAEROBES ISOLATED  Final   Report Status 11/25/2017 FINAL  Final  Culture, blood (routine x 2) Call MD if unable to obtain prior to antibiotics being given     Status: None (Preliminary result)   Collection Time: 11/23/17  3:35 PM  Result Value Ref Range Status   Specimen Description BLOOD LEFT ANTECUBITAL  Final   Special Requests IN PEDIATRIC BOTTLE Blood Culture adequate volume  Final   Culture NO GROWTH 4 DAYS  Final   Report Status PENDING  Incomplete  Culture, blood (routine x 2) Call MD if unable to obtain prior to antibiotics being given     Status: None (Preliminary result)   Collection Time: 11/23/17  3:41 PM  Result Value Ref Range Status   Specimen Description BLOOD RIGHT ANTECUBITAL  Final   Special Requests   Final    BOTTLES DRAWN AEROBIC ONLY Blood Culture adequate volume   Culture NO GROWTH 4 DAYS  Final   Report Status PENDING  Incomplete   Time coordinating discharge: 35 minutes  SIGNED:  Merlene Laughtermair Latif Ripley Lovecchio, DO [facility] 11/27/2017, 2:33 PM Pager [phone]  If 7PM-7AM, please contact night-coverage [email or web address] Password [email or web address]

## 2017-11-27 NOTE — Progress Notes (Signed)
CSW made referral to [facility], awaiting follow up from liaison. CSW to follow and support with hospice placement.  Peter ButtsSusan Esiquio Becker, LCSWA [phone]

## 2017-11-27 NOTE — Progress Notes (Signed)
Rehab admissions - Noted patient is now comfort care.  I will not pursue inpatient rehab admission for patient at this time.  Call me for questions.  #[phone]

## 2017-11-27 NOTE — Clinical Social Work Note (Signed)
Clinical Social Work Assessment  Patient Details  Name: Peter Becker MRN: [id number] Date of Birth: 02/02/1929  Date of referral:  11/27/17               Reason for consult:  Facility Placement, End of Life/Hospice                Permission sought to share information with:  Facility Sport and exercise psychologist, Family Supports Permission granted to share information::     Name::     Peter Becker::  [facility]  Relationship::  spouse  Contact Information:  [phone]  Housing/Transportation Living arrangements for the past 2 months:  Single Family Home Source of Information:  Spouse Patient Interpreter Needed:  None Criminal Activity/Legal Involvement Pertinent to Current Situation/Hospitalization:  No - Comment as needed Significant Relationships:  Spouse, Adult Children Lives with:  Spouse Do you feel safe going back to the place where you live?  Yes Need for family participation in patient care:  Yes (Comment)  Care giving concerns: Patient from home with spouse. Plan is for residential hospice.   Social Worker assessment / plan: CSW made referral to [place] residential hosp[facility]. Hospice has bed available and can take patient today. CSW met with patient and spouse, Peter Becker, at bedside; patient was asleep. Peter Becker was accepting and understanding of patient's prognosis and agreeable to residential hospice placement. Peter Becker shared with CSW some stories of her and patient's life together and shared pictures of patient's cross-stitching work. CSW acknowledged spouse's desire to celebrate patient's life and provided active listening. CSW to follow and support with discharge to [name].  Employment status:  Retired Forensic scientist:  Medicare PT Recommendations:  Inpatient [facility] / Referral to community resources:  Other (Comment Required)(residential hospice)  Patient/Family's Response to care: Spouse appreciative of  care.  Patient/Family's Understanding of and Emotional Response to Diagnosis, Current Treatment, and Prognosis: Spouse with good understanding of patient's condition and prognosis.  Emotional Assessment Appearance:  Appears stated age Attitude/Demeanor/Rapport:  Unable to Assess Affect (typically observed):  Unable to Assess Orientation:    Alcohol / Substance use:  Not Applicable Psych involvement (Current and /or in the community):  No (Comment)  Discharge Needs  Concerns to be addressed:  Discharge Planning Concerns, Care Coordination Readmission within the last 30 days:  No Current discharge risk:  Terminally ill Barriers to Discharge:  No Barriers Identified   Estanislado Emms, LCSW 11/27/2017, 1:47 PM

## 2017-11-27 NOTE — Progress Notes (Signed)
Pt. Was asleep and family of mother and daughter on-site. Family appreciative of chaplain's visit and talked  about good family support system. Both talked about [place] and strong sustenance from it. Chaplain provided emotional support, compassionate presence and prayer.   11/27/17 1500  Clinical Encounter Type  Visited With Patient and family together  Visit Type Initial  Consult/Referral To Chaplain  Spiritual Encounters  Spiritual Needs Prayer  Stress Factors  Patient Stress Factors Exhausted  Family Stress Factors Exhausted  .  Rilyn Upshaw a Water quality scientistMusiko-Holley, E[employer]. du PontChaplain

## 2017-11-27 NOTE — Progress Notes (Signed)
Nutrition Brief Note  Chart reviewed. Pt now transitioning to comfort care.  No further nutrition interventions warranted at this time.  Please re-consult as needed.   Costella Schwarz A. [place], RD, LDN, CDE Pager: [phone] After hours Pager: [phone]  

## 2017-11-27 NOTE — Progress Notes (Signed)
Patient will discharge to [facility] Anticipated discharge date: 11/27/17 Family notified: Festus BarrenVirginia Sitter, spouse Transportation by: [facility]  Nurse to call report to [phone].   CSW signing off.  Abigail ButtsSusan Ikenna Ohms, LCSWA  Clinical Social Worker

## 2017-11-27 NOTE — Progress Notes (Signed)
[facility]. [facility] is the transportation service. I will continue to monitor closely.   Sheppard Evensina Caylor [place]ar[place] RN

## 2017-11-28 LAB — CULTURE, BLOOD (ROUTINE X 2)
CULTURE: NO GROWTH
Culture: NO GROWTH
SPECIAL REQUESTS: ADEQUATE
SPECIAL REQUESTS: ADEQUATE

## 2017-12-09 DIAGNOSIS — E119 Type 2 diabetes mellitus without complications: Secondary | ICD-10-CM

## 2017-12-09 DIAGNOSIS — D510 Vitamin B12 deficiency anemia due to intrinsic factor deficiency: Secondary | ICD-10-CM

## 2017-12-09 DIAGNOSIS — I2510 Atherosclerotic heart disease of native coronary artery without angina pectoris: Secondary | ICD-10-CM | POA: Diagnosis not present

## 2017-12-09 DIAGNOSIS — I10 Essential (primary) hypertension: Secondary | ICD-10-CM | POA: Diagnosis not present

## 2017-12-09 DIAGNOSIS — I6930 Unspecified sequelae of cerebral infarction: Secondary | ICD-10-CM

## 2017-12-13 ENCOUNTER — Telehealth: Payer: Self-pay | Admitting: Family Medicine

## 2017-12-13 NOTE — Telephone Encounter (Signed)
Received call from RN line. Stated that patient had a blood pressure reading of 180/79. Currently asymptomatic - no new neuro deficits, no CP, no SOB. He is not currently on any BP meds. BP had previously been well controlled. Advised that they should check a set of orthostatic vitals and continue to monitor. Discussed reasons to go to the ED including development of neuro deficits, CP, SOB or continued elevation of BP. Given this isolated reading with lack of symptoms, do not need medical intervention at this time.   Katina Degreealeb M. Jimmey RalphParker, MD 12/13/2017 7:32 PM

## 2017-12-15 ENCOUNTER — Telehealth: Payer: Self-pay

## 2017-12-15 NOTE — Telephone Encounter (Signed)
[facility]. Started lisinopril 10mg  and also metformin 500 bid for elevated sugars

## 2017-12-15 NOTE — Telephone Encounter (Signed)
PLEASE NOTE: All timestamps contained within this report are represented as [place]. CONFIDENTIALTY NOTICE: This fax transmission is intended only for the addressee. It contains information that is legally privileged, confidential or otherwise protected from use or disclosure. If you are not the intended recipient, you are strictly prohibited from reviewing, disclosing, copying using or disseminating any of this information or taking any action in reliance on or regarding this information. If you have received this fax in error, please notify us immediately by telephone so that we can arrange for its return to us. Phone: [phone], Toll-Free: [phone], Fax: [phone] Page: 1 of 2 Call Id: [id number] [facility] - Client TELEPHONE ADVICE RECORD [facility] Patient Name: Peter Becker Gender: Male DOB: 08-Aug-1929 Age: 84 Y 10 M 22 D Return Phone Number: [phone] (Primary) Address: City/State/Zip: [place] Client [facility] Physician Tillman AbideLetvak, Richard - MD Contact Type Call Who Is Calling Patient / Member / Family / Caregiver Call Type Triage / Clinical Caller Name Narjette Relationship To Patient Care Giver Return Phone Number [phone] (Primary) Chief Complaint Blood Pressure High Reason for Call Symptomatic / Request for Health Information Initial Comment Caller states they are [facility] a mutual patient with high blood pressure of 180/79 with no symptoms at this time. Translation No Nurse Assessment Nurse: Purvis SheffieldShook-Minyard, RN, Hannelore Date/Time (Eastern Time): 12/13/2017 7:16:11 PM Confirm and document reason for call. If symptomatic, describe symptoms. ---Caller (Narjette LPN) states patient has a hx of CVA, 180/79 right now. He is only on an aspirin 81mg , B12 injections, TB shot and doing BS daily.  Takes no other meds. Has been at the facility a couple days. CNA 188/90ish then nurse checked it and got the previous pressure. Had therapy today and he doesn't look like he has any deficits. Wanted to notify doctor that BP is getting higher and he has no meds for this. Does the patient have any new or worsening symptoms? ---No Guidelines Guideline Title Affirmed Question Affirmed Notes Nurse Date/Time (Eastern Time) Disp. Time Lamount Cohen(Eastern Time) Disposition Final User 12/13/2017 7:31:29 PM Called On-Call Provider Shook-Minyard, RN, [facility] 12/13/2017 7:32:16 PM Clinical Call Yes Shook-Minyard, RN, Hannelore Comments User: Threasa HeadsHannelore, Shook-Minyard, RN Date/Time Lamount Cohen(Eastern Time): 12/13/2017 7:31:21 PM Spoke with Narjette LPN, pts nurse. Let her know Dr. Jimmey RalphParker wants them to do orthostatic BP's daily for the next few days and see what the patients bp is trending. If he develops sx such as chest pain, sob, vision changes etc and his pressures are into the 190s/ 90s then they should send him to ER for evaluation. PLEASE NOTE: All timestamps contained within this report are represented as [place] Standard Time. CONFIDENTIALTY NOTICE: This fax transmission is intended only for the addressee. It contains information that is legally privileged, confidential or otherwise protected from use or disclosure. If you are not the intended recipient, you are strictly prohibited from reviewing, disclosing, copying using or disseminating any of this information or taking any action in reliance on or regarding this information. If you have received this fax in error, please notify us immediately by telephone so that we can arrange for its return to us. Phone: [phone], Toll-Free: [phone], Fax: [phone] Page: 2 of 2 Call Id: [id number] Paging DoctorName Phone DateTime Result/Outcome Message Type Notes Jacquiline Doearker, Caleb- MD [phone] 12/13/2017 7:31:29 PM Called On Call Provider - Reached Doctor  Paged Jacquiline DoeParker, Caleb-  MD 12/13/2017 7:31:57 PM Spoke with On Call - General Message Result received instructions from on call md for patient and called facility to let the LPN know what those are.

## 2018-01-26 ENCOUNTER — Ambulatory Visit (INDEPENDENT_AMBULATORY_CARE_PROVIDER_SITE_OTHER): Payer: Medicare Other

## 2018-01-26 ENCOUNTER — Encounter (INDEPENDENT_AMBULATORY_CARE_PROVIDER_SITE_OTHER): Payer: Self-pay | Admitting: Vascular Surgery

## 2018-01-26 ENCOUNTER — Ambulatory Visit (INDEPENDENT_AMBULATORY_CARE_PROVIDER_SITE_OTHER): Payer: Medicare Other | Admitting: Vascular Surgery

## 2018-01-26 VITALS — BP 127/67 | HR 64 | Resp 16 | Wt 125.6 lb

## 2018-01-26 DIAGNOSIS — I25118 Atherosclerotic heart disease of native coronary artery with other forms of angina pectoris: Secondary | ICD-10-CM

## 2018-01-26 DIAGNOSIS — I739 Peripheral vascular disease, unspecified: Secondary | ICD-10-CM

## 2018-01-26 DIAGNOSIS — I714 Abdominal aortic aneurysm, without rupture, unspecified: Secondary | ICD-10-CM

## 2018-01-26 DIAGNOSIS — E119 Type 2 diabetes mellitus without complications: Secondary | ICD-10-CM | POA: Diagnosis not present

## 2018-01-26 DIAGNOSIS — I10 Essential (primary) hypertension: Secondary | ICD-10-CM | POA: Diagnosis not present

## 2018-01-26 DIAGNOSIS — I872 Venous insufficiency (chronic) (peripheral): Secondary | ICD-10-CM

## 2018-01-26 NOTE — Progress Notes (Signed)
MRN : [id number]  Peter Becker is a 84 y.o. (1929/07/28) male who presents with chief complaint of No chief complaint on file. Marland Kitchen  History of Present Illness: The patient returns to the office for surveillance of an abdominal aortic aneurysm status post stent graft placement on 03/22/2015.   Patient denies abdominal pain or back pain, no other abdominal complaints. No groin related complaints. No symptoms consistent with distal embolization No changes in claudication distance.   There have been a major change in his overall healthcare since his last visit.  He experience what sounds like a stroke with and episode of being locked in.  Miraculously he seems to have completely resolved this with minimal lasting effects  Patient denies amaurosis fugax or TIA symptoms. There is no history of claudication or rest pain symptoms of the lower extremities. The patient denies angina or shortness of breath.   Duplex US of the aorta and iliac arteries shows a 4.3 cm AAA sac with no endoleak, slight increase in the sac compared to the previous study.  ABI's are normal bilaterally   No outpatient medications have been marked as taking for the 01/26/18 encounter (Appointment) with Gilda Crease, Latina Craver, MD.    Past Medical History:  Diagnosis Date  . Diabetes mellitus without complication (HCC)   . Hypertension   . Myocardial infarct [facility])     Past Surgical History:  Procedure Laterality Date  . CARPAL TUNNEL RELEASE Left   . CHOLECYSTECTOMY    . EYE SURGERY    . heart stent     triple bypass  . HEMORRHOID SURGERY    . TONSILLECTOMY      Social History Social History   Tobacco Use  . Smoking status: Former Games developer  . Smokeless tobacco: Never Used  Substance Use Topics  . Alcohol use: No  . Drug use: No    Family History Family History  Problem Relation Age of Onset  . Heart attack Father     Allergies  Allergen Reactions  . Tape Other (See Comments)    SKIN IS VERY THIN AND  TEARS AND BRUISES EASILY; Please use an alternative!!     REVIEW OF SYSTEMS (Negative unless checked)  Constitutional: [] Weight loss  [] Fever  [] Chills Cardiac: [] Chest pain   [] Chest pressure   [] Palpitations   [] Shortness of breath when laying flat   [] Shortness of breath with exertion. Vascular:  [] Pain in legs with walking   [] Pain in legs at rest  [] History of DVT   [] Phlebitis   [] Swelling in legs   [] Varicose veins   [] Non-healing ulcers Pulmonary:   [] Uses home oxygen   [] Productive cough   [] Hemoptysis   [] Wheeze  [] COPD   [] Asthma Neurologic:  [] Dizziness   [] Seizures   [x] History of stroke   [] History of TIA  [] Aphasia   [] Vissual changes   [] Weakness or numbness in arm   [] Weakness or numbness in leg Musculoskeletal:   [] Joint swelling   [] Joint pain   [] Low back pain Hematologic:  [] Easy bruising  [] Easy bleeding   [] Hypercoagulable state   [] Anemic Gastrointestinal:  [] Diarrhea   [] Vomiting  [] Gastroesophageal reflux/heartburn   [] Difficulty swallowing. Genitourinary:  [] Chronic kidney disease   [] Difficult urination  [] Frequent urination   [] Blood in urine Skin:  [] Rashes   [] Ulcers  Psychological:  [] History of anxiety   []  History of major depression.  Physical Examination  There were no vitals filed for this visit. There is no height or weight on file  to calculate BMI. Gen: WD/WN, NAD Head: [place]/AT, No temporalis wasting.  Ear/Nose/Throat: Hearing grossly intact, nares w/o erythema or drainage Eyes: PER, EOMI, sclera nonicteric.  Neck: Supple, no large masses.   Pulmonary:  Good air movement, no audible wheezing bilaterally, no use of accessory muscles.  Cardiac: RRR, no JVD Vascular:  Vessel Right Left  Radial Palpable Palpable  Gastrointestinal: Non-distended. No guarding/no peritoneal signs.  Musculoskeletal: M/S 5/5 throughout.  No deformity or atrophy.  Neurologic: CN 2-12 intact. Symmetrical.  Speech is fluent. Motor exam as listed above. Psychiatric: Judgment  intact, Mood & affect appropriate for pt's clinical situation. Dermatologic: No rashes or ulcers noted.  No changes consistent with cellulitis. Lymph : No lichenification or skin changes of chronic lymphedema.  CBC Lab Results  Component Value Date   WBC 7.5 11/24/2017   HGB 10.1 (L) 11/24/2017   HCT 29.8 (L) 11/24/2017   MCV 92.0 11/24/2017   PLT 180 11/24/2017    BMET    Component Value Date/Time   NA 144 11/24/2017 0307   NA 136 03/23/2015 0508   K 3.3 (L) 11/24/2017 0307   K 4.3 03/23/2015 0508   CL 114 (H) 11/24/2017 0307   CL 103 03/23/2015 0508   CO2 24 11/24/2017 0307   CO2 26 03/23/2015 0508   GLUCOSE 158 (H) 11/24/2017 0307   GLUCOSE 145 (H) 03/23/2015 0508   BUN 17 11/24/2017 0307   BUN 12 03/23/2015 0508   CREATININE 0.90 11/24/2017 0307   CREATININE 1.09 03/23/2015 0508   CALCIUM 8.2 (L) 11/24/2017 0307   CALCIUM 8.1 (L) 03/23/2015 0508   GFRNONAA >60 11/24/2017 0307   GFRNONAA >60 03/23/2015 0508   GFRAA >60 11/24/2017 0307   GFRAA >60 03/23/2015 0508   CrCl cannot be calculated (Patient's most recent lab result is older than the maximum 21 days allowed.).  COAG Lab Results  Component Value Date   INR 1.04 11/19/2017   INR 1.2 03/23/2015   INR 1.0 03/06/2015    Radiology No results found.   Assessment/Plan 1. AAA (abdominal aortic aneurysm) without rupture (HCC) Recommend: Patient is status post successful endovascular repair of the AAA.   No further intervention is required at this time.   No endoleak is detected and the aneurysm sac is stable.  The patient will continue antiplatelet therapy as prescribed as well as aggressive management of hyperlipidemia. Exercise is again strongly encouraged.   However, endografts require continued surveillance with ultrasound or CT scan. This is mandatory to detect any changes that allow repressurization of the aneurysm sac.  The patient is informed that this would be asymptomatic.  The patient is  reminded that lifelong routine surveillance is a necessity with an endograft. Patient will continue to follow-up at 12 month intervals with ultrasound of the aorta.  - VAS US AORTA/IVC/ILIACS; Future  2. Chronic venous insufficiency No surgery or intervention at this point in time.    I have reviewed my discussion with the patient regarding venous insufficiency and secondary lymph edema and why it  causes symptoms. I have discussed with the patient the chronic skin changes that accompany these problems and the long term sequela such as ulceration and infection.  Patient will continue wearing graduated compression stockings class 1 (20-30 mmHg) on a daily basis a prescription was given to the patient to keep this updated. The patient will  put the stockings on first thing in the morning and removing them in the evening. The patient is instructed specifically not to  sleep in the stockings.  In addition, behavioral modification including elevation during the day will be continued.  Diet and salt restriction was also discussed.  Previous duplex ultrasound of the lower extremities shows normal deep venous system, superficial reflux was not present.   At this time the patient states they are satisfied with the control compression and elevation is yielding.    3. PAD (peripheral artery disease) (HCC)  Recommend:  The patient has evidence of atherosclerosis of the lower extremities with claudication.  The patient does not voice lifestyle limiting changes at this point in time.  Noninvasive studies do not suggest clinically significant change.  No invasive studies, angiography or surgery at this time The patient should continue walking and begin a more formal exercise program.  The patient should continue antiplatelet therapy and aggressive treatment of the lipid abnormalities  No changes in the patient's medications at this time  The patient should continue wearing graduated compression socks  10-15 mmHg strength to control the mild edema.    4. Benign essential HTN Continue antihypertensive medications as already ordered, these medications have been reviewed and there are no changes at this time.   5. Coronary artery disease of native artery of native heart with stable angina pectoris (HCC) Continue cardiac and antihypertensive medications as already ordered and reviewed, no changes at this time.  Continue statin as ordered and reviewed, no changes at this time  Nitrates PRN for chest pain   6. Diabetes mellitus type 2 in nonobese [facility]) Continue hypoglycemic medications as already ordered, these medications have been reviewed and there are no changes at this time.  Hgb A1C to be monitored as already arranged by primary service     Levora Dredge, MD  01/26/2018 8:27 AM

## 2018-02-17 ENCOUNTER — Ambulatory Visit: Payer: Medicare Other | Attending: Neurology | Admitting: Speech Pathology

## 2018-02-17 DIAGNOSIS — R498 Other voice and resonance disorders: Secondary | ICD-10-CM | POA: Diagnosis not present

## 2018-02-18 ENCOUNTER — Other Ambulatory Visit: Payer: Self-pay

## 2018-02-18 ENCOUNTER — Encounter: Payer: Self-pay | Admitting: Speech Pathology

## 2018-02-18 NOTE — Therapy (Signed)
[facility] [address] [place], [place], [address] Phone: [phone]   Fax:  [phone]  Speech Language Pathology Evaluation  Patient Details  Name: Peter FretRalph Becker MRN: [id number] Date of Birth: [birth year] Referring Provider: Lonell FaceSHAH, HEMANG K   Encounter Date: 02/17/2018  End of Session - 02/18/18 0947    Visit Number  1    Number of Visits  5    Date for SLP Re-Evaluation  03/20/18    SLP Start Time  1100    SLP Stop Time   1158    SLP Time Calculation (min)  58 min    Activity Tolerance  Patient tolerated treatment well       Past Medical History:  Diagnosis Date  . Diabetes mellitus without complication (HCC)   . Hypertension   . Myocardial infarct [facility](HCC)     Past Surgical History:  Procedure Laterality Date  . CARPAL TUNNEL RELEASE Left   . CHOLECYSTECTOMY    . EYE SURGERY    . heart stent     triple bypass  . HEMORRHOID SURGERY    . TONSILLECTOMY      There were no vitals filed for this visit.      SLP Evaluation [facility] - 02/18/18 0001      SLP Visit Information   SLP Received On  02/17/18    Referring Provider  [facility], [facility]    Onset Date  11/19/2017    Medical Diagnosis  CVA      Subjective   Subjective   "I can talk louder if I want to"    Patient/Family Stated Goal  Stronger voice      General Information   HPI  84 year old man, S/P CVA 11/19/2017, with residual dysarthria and hypophonia.  The patient and his wife report [facility] (per their report, ST worked on oral motor exercises and basic speech intelligibility strategies, vocal loudness was not addressed).      Prior Functional Status   Cognitive/Linguistic Baseline  Within functional limits      Oral Motor/Sensory Function   Overall Oral Motor/Sensory Function  Appears within functional limits for tasks assessed      Motor Speech   Overall Motor Speech  Impaired    Respiration  Impaired    Level  of Impairment  Conversation    Phonation  Low vocal intensity    Resonance  Within functional limits    Articulation  Within functional limitis    Intelligibility  Intelligible    Motor Planning  Witnin functional limits    Phonation  Impaired    Volume  Soft        Maintaining a strong voice  Loud "Ah" (Improves lingual motility, timing of swallow, oropharyngeal transit time and improves vocal strength and quality) . Say "Ah" as loud and long as you can . Repeat 7 times . Be sure you are not straining in your throat- dig deep and be LOUD  Patient able to generate loud "ah" X7 at 80 dB with min cues  Effortful Pitch Glide  (Falsetto) (Increases laryngeal elevation and improves vocal strength) . Say "EEEEEE" starting as low as you can and glide up to as high as you can . Hold the high note for 10-20 seconds . Feel the muscles in your throat tighten and your voice box move up . Repeat 7 times  Patient able to generate effortful pitch glide X7 with min cues  Read or Talk Loudly: . Read aloud for 10 minutes. Any type of reading material will do. And/Or  . Talk loud for 10 minutes. Any topic will do- talk about past events, photo albums, movies you enjoy, how to complete a project  Patient able to read aloud X10 minutes with average 12 dB    SLP Education - 02/18/18 0947    Education provided  Yes    Education Details  Voice Building Exercise    Person(s) Educated  Patient;Spouse    Methods  Explanation;Demonstration;Verbal cues;Handout    Comprehension  Verbalized understanding;Returned demonstration;Verbal cues required;Need further instruction         SLP Long Term Goals - 02/18/18 0951      SLP LONG TERM GOAL #1   Title  Patient will independently execute Voice Building HEP 5-7 days per week     Time  4    Period  Weeks    Status  New    Target Date  03/20/18       Plan - 02/18/18 0948    Clinical Impression Statement  This 84 year man, S/P CVA 11/19/2017,  is presenting with minimal dysarthria of speech and moderate-severe dysphonia / hypophonia.  The patient's speech is fully intelligible when audible.  The patient was given a simple voice building home exercise program and demonstrates ability to accurately execute the exercises with min feedback.  The patient's wife was present and demonstrated excellent skills in recognizing and promoting accurate execution.  The patient would benefit from 2-3 follow-up sessions to insure he is accurately executing the program and it is meeting his needs.    Speech Therapy Frequency  1x /week    Duration  4 weeks    Treatment/Interventions  Patient/family education;Other (comment) Voice building HEP    Potential to Achieve Goals  Good    Potential Considerations  Ability to learn/carryover information;Pain level;Family/community support;Co-morbidities;Previous level of function;Cooperation/participation level;Severity of impairments;Medical prognosis    SLP Home Exercise Plan  Loud "ah" / Effortful pitch glides / Read/speak loudly X10 minutes     Consulted and Agree with Plan of Care  Patient;Family member/caregiver    Family Member Consulted  Spouse       Patient will benefit from skilled therapeutic intervention in order to improve the following deficits and impairments:   Hypophonia - Plan: SLP plan of care cert/re-cert    Problem List Patient Active Problem List   Diagnosis Date Noted  . Adult failure to thrive syndrome   . Weakness generalized   . Aspiration pneumonia (HCC)   . Pressure injury of skin 11/25/2017  . DNR (do not resuscitate)   . Palliative care by specialist   . Dysphagia   . TB lung, latent   . Benign essential HTN   . Diabetes mellitus type 2 in nonobese (HCC)   . Acute blood loss anemia   . Altered mental status   . Cough   . Meningoencephalitis   . Seizure (HCC)   . FUO (fever of unknown origin)   . Ischemic stroke (HCC) 11/19/2017  . AAA (abdominal aortic aneurysm)  without rupture (HCC) 01/20/2017  . PAD (peripheral artery disease) (HCC) 01/20/2017  . CAD (coronary artery disease) 01/20/2017  . Essential hypertension 01/20/2017  . Hyperlipidemia 01/20/2017  . Chronic venous insufficiency 01/20/2017  . Venous stasis dermatitis 01/20/2017   [name], MS/CCC- SLP  Peter Becker 02/18/2018, 9:55 AM  [facility] SERVICES [address] [place], [place], [address] Phone: [phone]   Fax:  [phone]  Name: Peter Becker MRN: [id number] Date of Birth: 06-15-1929

## 2018-02-20 ENCOUNTER — Ambulatory Visit: Payer: Medicare Other | Admitting: Speech Pathology

## 2018-02-26 ENCOUNTER — Ambulatory Visit: Payer: Medicare Other | Admitting: Speech Pathology

## 2018-02-26 ENCOUNTER — Encounter: Payer: Self-pay | Admitting: Speech Pathology

## 2018-02-26 DIAGNOSIS — R498 Other voice and resonance disorders: Secondary | ICD-10-CM

## 2018-02-26 NOTE — Therapy (Signed)
[facility] [facility] [address] [place], [place], [address] Phone: [phone]   Fax:  [phone]  Speech Language Pathology Treatment  Patient Details  Name: Peter Becker MRN: [id number] Date of Birth: Sep 11, 1929 Referring Provider: Lonell FaceSHAH, HEMANG K   Encounter Date: 02/26/2018  End of Session - 02/26/18 1456    Visit Number  2    Number of Visits  5    Date for SLP Re-Evaluation  03/20/18    SLP Start Time  1100    SLP Stop Time   1200    SLP Time Calculation (min)  60 min    Activity Tolerance  Patient tolerated treatment well       Past Medical History:  Diagnosis Date  . Diabetes mellitus without complication (HCC)   . Hypertension   . Myocardial infarct [facility](HCC)     Past Surgical History:  Procedure Laterality Date  . CARPAL TUNNEL RELEASE Left   . CHOLECYSTECTOMY    . EYE SURGERY    . heart stent     triple bypass  . HEMORRHOID SURGERY    . TONSILLECTOMY      There were no vitals filed for this visit.  Subjective Assessment - 02/26/18 1455    Subjective  Patient reports that he has not consistently executed the HEP    Patient is accompained by:  Family member            ADULT SLP TREATMENT - 02/26/18 0001      General Information   Behavior/Cognition  Alert;Cooperative;Pleasant mood    HPI   84 year old man, S/P CVA 11/19/2017, with residual dysarthria and hypophonia.  The patient and his wife report receiving speech therapy at [facility] (per their report, ST worked on oral motor exercises and basic speech intelligibility strategies, vocal loudness was not addressed).       Treatment Provided   Treatment provided  Cognitive-Linquistic      Pain Assessment   Pain Assessment  No/denies pain      Cognitive-Linquistic Treatment   Treatment focused on  Voice    Skilled Treatment  LOUD "AH": sustain "ah" for 5-7 seconds at 78 dB X15. EFFORTFUL PITCH GLIDES: X7 with good range. READING LOUDLY X10  MINUTES:  maintained average 73 dB. CONVERSATION: average 70 dB (30-40 minutes).      Assessment / Recommendations / Plan   Plan  Continue with current plan of care      Progression Toward Goals   Progression toward goals  Progressing toward goals       SLP Education - 02/26/18 1456    Education provided  Yes    Education Details  Importance of daily practice to strengthen the voice    Person(s) Educated  Patient;Spouse    Methods  Explanation    Comprehension  Verbalized understanding         SLP Long Term Goals - 02/18/18 0951      SLP LONG TERM GOAL #1   Title  Patient will independently execute Voice Building HEP 5-7 days per week     Time  4    Period  Weeks    Status  New    Target Date  03/20/18       Plan - 02/26/18 1457    Clinical Impression Statement  The patient is able to execute the HEP given last week with min cues to maintain loudness throughout. He reported that he has not been consistent  at home, but states that he will be.    Speech Therapy Frequency  1x /week    Duration  4 weeks    Treatment/Interventions  Patient/family education;Other (comment) Voice Building HEP    Potential to Achieve Goals  Good    Potential Considerations  Ability to learn/carryover information;Pain level;Family/community support;Co-morbidities;Previous level of function;Cooperation/participation level;Severity of impairments;Medical prognosis    SLP Home Exercise Plan  Loud "ah" / Effortful pitch glides / Read/speak loudly X10 minutes     Consulted and Agree with Plan of Care  Patient;Family member/caregiver    Family Member Consulted  Spouse       Patient will benefit from skilled therapeutic intervention in order to improve the following deficits and impairments:   Hypophonia    Problem List Patient Active Problem List   Diagnosis Date Noted  . Adult failure to thrive syndrome   . Weakness generalized   . Aspiration pneumonia (HCC)   . Pressure injury of skin  11/25/2017  . DNR (do not resuscitate)   . Palliative care by specialist   . Dysphagia   . TB lung, latent   . Benign essential HTN   . Diabetes mellitus type 2 in nonobese (HCC)   . Acute blood loss anemia   . Altered mental status   . Cough   . Meningoencephalitis   . Seizure (HCC)   . FUO (fever of unknown origin)   . Ischemic stroke (HCC) 11/19/2017  . AAA (abdominal aortic aneurysm) without rupture (HCC) 01/20/2017  . PAD (peripheral artery disease) (HCC) 01/20/2017  . CAD (coronary artery disease) 01/20/2017  . Essential hypertension 01/20/2017  . Hyperlipidemia 01/20/2017  . Chronic venous insufficiency 01/20/2017  . Venous stasis dermatitis 01/20/2017   [name], MS/CCC- SLP  Leandrew Koyanagi 02/26/2018, 2:58 PM  [place] [facility] MAIN [facility] SERVICES [address] [place], [place], [address] Phone: [phone]   Fax:  [phone]   Name: Peter Becker MRN: [id number] Date of Birth: 05/24/1929

## 2018-03-03 ENCOUNTER — Ambulatory Visit: Payer: Medicare Other | Admitting: Speech Pathology

## 2018-03-05 ENCOUNTER — Ambulatory Visit: Payer: Medicare Other | Attending: Neurology | Admitting: Speech Pathology

## 2018-03-05 ENCOUNTER — Encounter: Payer: Self-pay | Admitting: Speech Pathology

## 2018-03-05 ENCOUNTER — Encounter: Payer: Medicare Other | Admitting: Speech Pathology

## 2018-03-05 DIAGNOSIS — R498 Other voice and resonance disorders: Secondary | ICD-10-CM | POA: Diagnosis present

## 2018-03-05 NOTE — Therapy (Signed)
[place] [facility] MAIN [facility] SERVICES [address] [place], [place], [address] Phone: [phone]   Fax:  [phone]  Speech Language Pathology Treatment  Patient Details  Name: Duval Macleod MRN: [id number] Date of Birth: 12-07-1928 Referring Provider: Lonell Face   Encounter Date: 03/05/2018  End of Session - 03/05/18 1218    Visit Number  3    Number of Visits  5    Date for SLP Re-Evaluation  03/20/18    SLP Start Time  1100    SLP Stop Time   1150    SLP Time Calculation (min)  50 min    Activity Tolerance  Patient tolerated treatment well       Past Medical History:  Diagnosis Date  . Diabetes mellitus without complication (HCC)   . Hypertension   . Myocardial infarct [facility])     Past Surgical History:  Procedure Laterality Date  . CARPAL TUNNEL RELEASE Left   . CHOLECYSTECTOMY    . EYE SURGERY    . heart stent     triple bypass  . HEMORRHOID SURGERY    . TONSILLECTOMY      There were no vitals filed for this visit.  Subjective Assessment - 03/05/18 1217    Subjective  Patient reports that he has been more consistently executing the HEP    Patient is accompained by:  Family member            ADULT SLP TREATMENT - 03/05/18 0001      General Information   Behavior/Cognition  Alert;Cooperative;Pleasant mood    HPI   84 year old man, S/P CVA 11/19/2017, with residual dysarthria and hypophonia.  The patient and his wife report receiving speech therapy at [facility] (per their report, ST worked on oral motor exercises and basic speech intelligibility strategies, vocal loudness was not addressed).       Treatment Provided   Treatment provided  Cognitive-Linquistic      Pain Assessment   Pain Assessment  No/denies pain      Cognitive-Linquistic Treatment   Treatment focused on  Voice    Skilled Treatment  LOUD "AH": sustain "ah" for 5-7 seconds at 85 dB X7. EFFORTFUL PITCH GLIDES: X7 with good range. READING LOUDLY X10  MINUTES:  maintained average 73 dB. CONVERSATION: average 68 dB (30-40 minutes).      Assessment / Recommendations / Plan   Plan  Continue with current plan of care      Progression Toward Goals   Progression toward goals  Progressing toward goals       SLP Education - 03/05/18 1218    Education provided  Yes    Education Details  Stay loud all the time    Person(s) Educated  Patient;Spouse    Methods  Explanation    Comprehension  Verbalized understanding         SLP Long Term Goals - 02/18/18 0951      SLP LONG TERM GOAL #1   Title  Patient will independently execute Voice Building HEP 5-7 days per week     Time  4    Period  Weeks    Status  New    Target Date  03/20/18       Plan - 03/05/18 1219    Clinical Impression Statement  The patient was able to sustain "ah" at louder volume this week. The patient is able to execute the HEP given with mod cues to maintain loudness throughout.  He reported that he has been more consistent at home.    Speech Therapy Frequency  1x /week    Duration  4 weeks    Treatment/Interventions  Patient/family education;Other (comment) Voice therapy    Potential to Achieve Goals  Good    Potential Considerations  Ability to learn/carryover information;Pain level;Family/community support;Co-morbidities;Previous level of function;Cooperation/participation level;Severity of impairments;Medical prognosis    SLP Home Exercise Plan  Loud "ah" / Effortful pitch glides / Read/speak loudly X10 minutes     Consulted and Agree with Plan of Care  Patient;Family member/caregiver    Family Member Consulted  Spouse       Patient will benefit from skilled therapeutic intervention in order to improve the following deficits and impairments:   Hypophonia    Problem List Patient Active Problem List   Diagnosis Date Noted  . Adult failure to thrive syndrome   . Weakness generalized   . Aspiration pneumonia (HCC)   . Pressure injury of skin 11/25/2017   . DNR (do not resuscitate)   . Palliative care by specialist   . Dysphagia   . TB lung, latent   . Benign essential HTN   . Diabetes mellitus type 2 in nonobese (HCC)   . Acute blood loss anemia   . Altered mental status   . Cough   . Meningoencephalitis   . Seizure (HCC)   . FUO (fever of unknown origin)   . Ischemic stroke (HCC) 11/19/2017  . AAA (abdominal aortic aneurysm) without rupture (HCC) 01/20/2017  . PAD (peripheral artery disease) (HCC) 01/20/2017  . CAD (coronary artery disease) 01/20/2017  . Essential hypertension 01/20/2017  . Hyperlipidemia 01/20/2017  . Chronic venous insufficiency 01/20/2017  . Venous stasis dermatitis 01/20/2017   Dollene PrimroseSusan G Rachard Isidro, MS/CCC- SLP  Leandrew KoyanagiAbernathy, Susie 03/05/2018, 12:19 PM  [place] [facility] [address] [place], [place], [address] Phone: [phone]   Fax:  [phone]   Name: Rosana FretRalph Muradyan MRN: [id number] Date of Birth: December 18, 1928

## 2018-03-06 ENCOUNTER — Other Ambulatory Visit: Payer: Self-pay

## 2018-03-06 NOTE — Patient Outreach (Signed)
Telephone outreach to patient to obtain mRS was successfully completed. mRS = 3 

## 2018-03-10 ENCOUNTER — Ambulatory Visit: Payer: Medicare Other | Admitting: Speech Pathology

## 2018-03-12 ENCOUNTER — Encounter: Payer: Self-pay | Admitting: Speech Pathology

## 2018-03-12 ENCOUNTER — Ambulatory Visit: Payer: Medicare Other | Admitting: Speech Pathology

## 2018-03-12 DIAGNOSIS — R498 Other voice and resonance disorders: Secondary | ICD-10-CM | POA: Diagnosis not present

## 2018-03-12 NOTE — Therapy (Signed)
[place] [facility] [address] [place], [place], [address] Phone: [phone]   Fax:  [phone]  Speech Language Pathology Treatment  Patient Details  Name: Peter FretRalph Caccamo MRN: [id number] Date of Birth: September 25, 1929 Referring Provider: Lonell FaceSHAH, HEMANG K   Encounter Date: 03/12/2018  End of Session - 03/12/18 1245    Visit Number  4    Number of Visits  5    Date for SLP Re-Evaluation  03/20/18    SLP Start Time  0900    SLP Stop Time   0956    SLP Time Calculation (min)  56 min    Activity Tolerance  Patient tolerated treatment well       Past Medical History:  Diagnosis Date  . Diabetes mellitus without complication (HCC)   . Hypertension   . Myocardial infarct [facility](HCC)     Past Surgical History:  Procedure Laterality Date  . CARPAL TUNNEL RELEASE Left   . CHOLECYSTECTOMY    . EYE SURGERY    . heart stent     triple bypass  . HEMORRHOID SURGERY    . TONSILLECTOMY      There were no vitals filed for this visit.  Subjective Assessment - 03/12/18 1245    Subjective  The patient's wife reports that he has been louder at home    Patient is accompained by:  Family member            ADULT SLP TREATMENT - 03/12/18 0001      General Information   Behavior/Cognition  Alert;Cooperative;Pleasant mood    HPI   84 year old man, S/P CVA 11/19/2017, with residual dysarthria and hypophonia.  The patient and his wife report [facility] (per their report, ST worked on oral motor exercises and basic speech intelligibility strategies, vocal loudness was not addressed).       Treatment Provided   Treatment provided  Cognitive-Linquistic      Pain Assessment   Pain Assessment  No/denies pain      Cognitive-Linquistic Treatment   Treatment focused on  Voice    Skilled Treatment  LOUD "AH": sustain "ah" for 5-7 seconds at 85 dB X15. EFFORTFUL PITCH GLIDES: X7 with good range. READING LOUDLY X10 MINUTES:   maintained average 75 dB. CONVERSATION: average 70 dB (30-40 minutes).      Assessment / Recommendations / Plan   Plan  Continue with current plan of care      Progression Toward Goals   Progression toward goals  Progressing toward goals       SLP Education - 03/12/18 1245    Education provided  Yes    Education Details  Keep it loud    Person(s) Educated  Patient;Spouse    Methods  Explanation    Comprehension  Verbalized understanding         SLP Long Term Goals - 02/18/18 0951      SLP LONG TERM GOAL #1   Title  Patient will independently execute Voice Building HEP 5-7 days per week     Time  4    Period  Weeks    Status  New    Target Date  03/20/18       Plan - 03/12/18 1246    Clinical Impression Statement  The patient is able to execute the HEP given with mod cues to maintain loudness throughout. He reported that he has been more consistent at home and his wife reports  that he is being loud with fewer cues.    Speech Therapy Frequency  1x /week    Duration  4 weeks    Treatment/Interventions  Patient/family education;Other (comment) Voice therapy    Potential to Achieve Goals  Good    Potential Considerations  Ability to learn/carryover information;Pain level;Family/community support;Co-morbidities;Previous level of function;Cooperation/participation level;Severity of impairments;Medical prognosis    SLP Home Exercise Plan  Loud "ah" / Effortful pitch glides / Read/speak loudly X10 minutes     Consulted and Agree with Plan of Care  Patient;Family member/caregiver    Family Member Consulted  Spouse       Patient will benefit from skilled therapeutic intervention in order to improve the following deficits and impairments:   Hypophonia    Problem List Patient Active Problem List   Diagnosis Date Noted  . Adult failure to thrive syndrome   . Weakness generalized   . Aspiration pneumonia (HCC)   . Pressure injury of skin 11/25/2017  . DNR (do not  resuscitate)   . Palliative care by specialist   . Dysphagia   . TB lung, latent   . Benign essential HTN   . Diabetes mellitus type 2 in nonobese (HCC)   . Acute blood loss anemia   . Altered mental status   . Cough   . Meningoencephalitis   . Seizure (HCC)   . FUO (fever of unknown origin)   . Ischemic stroke (HCC) 11/19/2017  . AAA (abdominal aortic aneurysm) without rupture (HCC) 01/20/2017  . PAD (peripheral artery disease) (HCC) 01/20/2017  . CAD (coronary artery disease) 01/20/2017  . Essential hypertension 01/20/2017  . Hyperlipidemia 01/20/2017  . Chronic venous insufficiency 01/20/2017  . Venous stasis dermatitis 01/20/2017   Peter Primrose, MS/CCC- SLP  Leandrew Koyanagi 03/12/2018, 12:47 PM  [facility] [facility] MAIN [facility] SERVICES [address] [place], [place], [address] Phone: [phone]   Fax:  [phone]   Name: Peter Becker MRN: [id number] Date of Birth: Nov 24, 1929

## 2018-03-17 ENCOUNTER — Ambulatory Visit: Payer: Medicare Other | Admitting: Speech Pathology

## 2018-03-19 ENCOUNTER — Ambulatory Visit: Payer: Medicare Other | Admitting: Speech Pathology

## 2018-03-20 ENCOUNTER — Encounter: Payer: Self-pay | Admitting: Speech Pathology

## 2018-03-20 ENCOUNTER — Ambulatory Visit: Payer: Medicare Other | Admitting: Speech Pathology

## 2018-03-20 DIAGNOSIS — R498 Other voice and resonance disorders: Secondary | ICD-10-CM | POA: Diagnosis not present

## 2018-03-20 NOTE — Therapy (Signed)
[facility]  [facility] [address] [place], [place], [address] Phone: [phone]   Fax:  [phone]  Speech Language Pathology Treatment/Re-Certification  Speech Therapy Progress Note   Dates of reporting period  02/17/2018   to   03/20/2018  Patient Details  Name: Peter Becker MRN: [id number] Date of Birth: 06/24/1929 Referring Provider: SHAH, HEMANG K   Encounter Date: 03/20/2018  End of Session - 03/20/18 0859    Visit Number  5    Number of Visits  5    Date for SLP Re-Evaluation  03/20/18    SLP Start Time  0800    SLP Stop Time   0850    SLP Time Calculation (min)  50 min    Activity Tolerance  Patient tolerated treatment well       Past Medical History:  Diagnosis Date  . Diabetes mellitus without complication (HCC)   . Hypertension   . Myocardial infarct (HCC)     Past Surgical History:  Procedure Laterality Date  . CARPAL TUNNEL RELEASE Left   . CHOLECYSTECTOMY    . EYE SURGERY    . heart stent     triple bypass  . HEMORRHOID SURGERY    . TONSILLECTOMY      There were no vitals filed for this visit.  Subjective Assessment - 03/20/18 0859    Subjective  The patient's wife reports that he has been louder at home    Patient is accompained by:  Family member            ADULT SLP TREATMENT - 03/20/18 0001      General Information   Behavior/Cognition  Alert;Cooperative;Pleasant mood    HPI   84 year old man, S/P CVA 11/19/2017, with residual dysarthria and hypophonia.  The patient and his wife report receiving speech therapy at [facility] (per their report, ST worked on oral motor exercises and basic speech intelligibility strategies, vocal loudness was not addressed).       Treatment Provided   Treatment provided  Cognitive-Linquistic      Pain Assessment   Pain Assessment  No/denies pain      Cognitive-Linquistic Treatment   Treatment focused on  Voice    Skilled Treatment  LOUD "AH":  sustain "ah" for 5-7 seconds at 85 dB X15. EFFORTFUL PITCH GLIDES: X7 with good range. READING LOUDLY X10 MINUTES:  maintained average 75 dB. CONVERSATION: average 70 dB (30-40 minutes).      Assessment / Recommendations / Plan   Plan  Continue with current plan of care      Progression Toward Goals   Progression toward goals  Progressing toward goals       SLP Education - 03/20/18 0859    Education provided  Yes    Education Details  Let wife give reminders to be loud when doing the HEP at home    Person(s) Educated  Patient;Spouse    Methods  Explanation    Comprehension  Verbalized understanding         SLP Long Term Goals - 03/20/18 0903      SLP LONG TERM GOAL #1   Title  Patient will independently execute Voice Building HEP 5-7 days per week     Time  2    Period  Weeks    Status  Partially Met    Target Date  04/03/18       Plan - 03/20/18 0901    Clinical Impression Statement    The patient is able to execute the HEP given with mod cues to maintain loudness throughout. He reported that he has been more consistent at home and his wife reports that he is being loud with fewer cues.  Will continue direct ST X2 weeks to insure patient and wife are comfortable with executing the HEP together.    Speech Therapy Frequency  1x /week    Duration  2 weeks    Treatment/Interventions  Patient/family education;Other (comment) Voice therapy       Patient will benefit from skilled therapeutic intervention in order to improve the following deficits and impairments:   Hypophonia - Plan: SLP plan of care cert/re-cert    Problem List Patient Active Problem List   Diagnosis Date Noted  . Adult failure to thrive syndrome   . Weakness generalized   . Aspiration pneumonia (HCC)   . Pressure injury of skin 11/25/2017  . DNR (do not resuscitate)   . Palliative care by specialist   . Dysphagia   . TB lung, latent   . Benign essential HTN   . Diabetes mellitus type 2 in nonobese  (HCC)   . Acute blood loss anemia   . Altered mental status   . Cough   . Meningoencephalitis   . Seizure (HCC)   . FUO (fever of unknown origin)   . Ischemic stroke (HCC) 11/19/2017  . AAA (abdominal aortic aneurysm) without rupture (HCC) 01/20/2017  . PAD (peripheral artery disease) (HCC) 01/20/2017  . CAD (coronary artery disease) 01/20/2017  . Essential hypertension 01/20/2017  . Hyperlipidemia 01/20/2017  . Chronic venous insufficiency 01/20/2017  . Venous stasis dermatitis 01/20/2017   Susan G Abernathy, MS/CCC- SLP  Abernathy, Susie 03/20/2018, 12:21 PM  [place] [facility] REHAB SERVICES [address] [place], [place], [address] Phone: [phone]   Fax:  [phone]   Name: Peter Becker MRN: [id number] Date of Birth: 04/03/1929 

## 2018-03-23 ENCOUNTER — Ambulatory Visit: Payer: Medicare Other | Admitting: Speech Pathology

## 2018-03-25 ENCOUNTER — Ambulatory Visit: Payer: Medicare Other | Admitting: Speech Pathology

## 2018-03-25 ENCOUNTER — Encounter: Payer: Self-pay | Admitting: Speech Pathology

## 2018-03-25 DIAGNOSIS — R498 Other voice and resonance disorders: Secondary | ICD-10-CM

## 2018-03-25 NOTE — Therapy (Signed)
[place] MAIN [facility] SERVICES [address] [place], [place], [address] Phone: [phone]   Fax:  [phone]  Speech Language Pathology Treatment  Patient Details  Name: Peter Becker MRN: [id number] Date of Birth: 1929/03/07 Referring Provider: Vladimir Crofts   Encounter Date: 03/25/2018  End of Session - 03/25/18 0952    Visit Number  6    Number of Visits  7    Date for SLP Re-Evaluation  03/20/18    SLP Start Time  0900    SLP Stop Time   0950    SLP Time Calculation (min)  50 min    Activity Tolerance  Patient tolerated treatment well       Past Medical History:  Diagnosis Date  . Diabetes mellitus without complication ([place])   . Hypertension   . Myocardial infarct [facility])     Past Surgical History:  Procedure Laterality Date  . CARPAL TUNNEL RELEASE Left   . CHOLECYSTECTOMY    . EYE SURGERY    . heart stent     triple bypass  . HEMORRHOID SURGERY    . TONSILLECTOMY      There were no vitals filed for this visit.  Subjective Assessment - 03/25/18 0951    Subjective  Patient reports that he is doing his HEP and his wife is assisting            ADULT SLP TREATMENT - 03/25/18 0001      General Information   Behavior/Cognition  Alert;Cooperative;Pleasant mood    HPI   84 year old man, S/P CVA 11/19/2017, with residual dysarthria and hypophonia.  The patient and his wife report receiving speech therapy at [facility] (per their report, ST worked on oral motor exercises and basic speech intelligibility strategies, vocal loudness was not addressed).       Treatment Provided   Treatment provided  Cognitive-Linquistic      Pain Assessment   Pain Assessment  No/denies pain      Cognitive-Linquistic Treatment   Treatment focused on  Voice    Skilled Treatment  LOUD "AH": sustain "ah" for 5-7 seconds at 85 dB X15. EFFORTFUL PITCH GLIDES: X7 with good range. READING LOUDLY X10 MINUTES:  maintained average 75 dB.  CONVERSATION: average 70 dB (30-40 minutes).      Assessment / Recommendations / Plan   Plan  Continue with current plan of care      Progression Toward Goals   Progression toward goals  Progressing toward goals       SLP Education - 03/25/18 0951    Education provided  Yes    Education Details  Stay loud all the time    Person(s) Educated  Patient    Methods  Explanation    Comprehension  Verbalized understanding         SLP Long Term Goals - 03/20/18 0903      SLP LONG TERM GOAL #1   Title  Patient will independently execute Voice Building HEP 5-7 days per week     Time  2    Period  Weeks    Status  Partially Met    Target Date  04/03/18       Plan - 03/25/18 6381    Clinical Impression Statement  The patient is able to execute the HEP given with min cues to maintain loudness throughout. He reported that he has been more consistent at home and his wife reports that he is being loud  with fewer cues.  Will continue direct ST X1 weeks to insure patient and wife are comfortable with executing the HEP together.    Speech Therapy Frequency  1x /week    Duration  2 weeks    Treatment/Interventions  Patient/family education;Other (comment) Voice therapy    Potential to Achieve Goals  Good    Potential Considerations  Ability to learn/carryover information;Pain level;Family/community support;Co-morbidities;Previous level of function;Cooperation/participation level;Severity of impairments;Medical prognosis    SLP Home Exercise Plan  Loud "ah" / Effortful pitch glides / Read/speak loudly X10 minutes     Consulted and Agree with Plan of Care  Patient;Family member/caregiver       Patient will benefit from skilled therapeutic intervention in order to improve the following deficits and impairments:   Hypophonia    Problem List Patient Active Problem List   Diagnosis Date Noted  . Adult failure to thrive syndrome   . Weakness generalized   . Aspiration pneumonia ([place])   .  Pressure injury of skin 11/25/2017  . DNR (do not resuscitate)   . Palliative care by specialist   . Dysphagia   . TB lung, latent   . Benign essential HTN   . Diabetes mellitus type 2 in nonobese (HCC)   . Acute blood loss anemia   . Altered mental status   . Cough   . Meningoencephalitis   . Seizure ([place])   . FUO (fever of unknown origin)   . Ischemic stroke ([place]) 11/19/2017  . AAA (abdominal aortic aneurysm) without rupture ([place]) 01/20/2017  . PAD (peripheral artery disease) ([place]) 01/20/2017  . CAD (coronary artery disease) 01/20/2017  . Essential hypertension 01/20/2017  . Hyperlipidemia 01/20/2017  . Chronic venous insufficiency 01/20/2017  . Venous stasis dermatitis 01/20/2017   Peter Sea, MS/CCC- SLP  Lou Miner 03/25/2018, 9:53 AM  [place] MAIN [facility] SERVICES [address] [place], [place], [address] Phone: [phone]   Fax:  [phone]   Name: Peter Becker MRN: [id number] Date of Birth: 04/28/1929

## 2018-03-31 ENCOUNTER — Ambulatory Visit: Payer: Medicare Other | Admitting: Speech Pathology

## 2018-04-02 ENCOUNTER — Encounter: Payer: Self-pay | Admitting: Speech Pathology

## 2018-04-02 ENCOUNTER — Ambulatory Visit: Payer: Medicare Other | Attending: Neurology | Admitting: Speech Pathology

## 2018-04-02 DIAGNOSIS — R498 Other voice and resonance disorders: Secondary | ICD-10-CM | POA: Diagnosis not present

## 2018-04-02 NOTE — Therapy (Signed)
[place] MAIN [facility] SERVICES [address] [place], [place], [address] Phone: [phone]   Fax:  [phone]  Speech Language Pathology Treatment/Discharge Summary    Speech Therapy Progress Note   Dates of reporting period  02/17/2018   to   04/02/2018   Patient Details  Name: Peter Becker MRN: [id number] Date of Birth: 05-03-1929 Referring Provider: Vladimir Crofts   Encounter Date: 04/02/2018  End of Session - 04/02/18 1601    Visit Number  7    Number of Visits  7    Date for SLP Re-Evaluation  04/02/18    SLP Start Time  0900    SLP Stop Time   2778    SLP Time Calculation (min)  55 min    Activity Tolerance  Patient tolerated treatment well       Past Medical History:  Diagnosis Date  . Diabetes mellitus without complication ([place])   . Hypertension   . Myocardial infarct [facility])     Past Surgical History:  Procedure Laterality Date  . CARPAL TUNNEL RELEASE Left   . CHOLECYSTECTOMY    . EYE SURGERY    . heart stent     triple bypass  . HEMORRHOID SURGERY    . TONSILLECTOMY      There were no vitals filed for this visit.  Subjective Assessment - 04/02/18 1600    Subjective  Patient reports that he is doing his HEP and his wife is assisting    Patient is accompained by:  Family member            ADULT SLP TREATMENT - 04/02/18 0001      General Information   Behavior/Cognition  Alert;Cooperative;Pleasant mood    HPI   84 year old man, S/P CVA 11/19/2017, with residual dysarthria and hypophonia.  The patient and his wife report receiving speech therapy at [facility] (per their report, ST worked on oral motor exercises and basic speech intelligibility strategies, vocal loudness was not addressed).       Treatment Provided   Treatment provided  Cognitive-Linquistic      Pain Assessment   Pain Assessment  No/denies pain      Cognitive-Linquistic Treatment   Treatment focused on  Voice    Skilled Treatment  LOUD  "AH": sustain "ah" for 5-7 seconds at 85 dB X15. EFFORTFUL PITCH GLIDES: X7 with good range. READING LOUDLY X10 MINUTES:  maintained average 75 dB. MODERATELY COMPLEX COGNITIVE -LINGUISTIC TASK, average 73 dB.  CONVERSATION: average 70 dB (30-40 minutes).      Assessment / Recommendations / Plan   Plan  Continue with current plan of care      Progression Toward Goals   Progression toward goals  Progressing toward goals       SLP Education - 04/02/18 1600    Education provided  Yes    Education Details  Do the HEP at least 6 days a week    Person(s) Educated  Patient;Spouse    Methods  Explanation    Comprehension  Verbalized understanding         SLP Long Term Goals - 04/02/18 1602      SLP LONG TERM GOAL #1   Title  Patient will independently execute Voice Building HEP 5-7 days per week     Status  Achieved       Plan - 04/02/18 1601    Clinical Impression Statement  The patient has met his goals and is ready  for discharge.  He is able to execute the HEP given with min cues to maintain loudness throughout. He reported that he has been more consistent at home and his wife reports that he is being loud with fewer cues.  He is willing to receive feedback from his wife and both report that he needs minimal cues ("I didn't hear that) to speak louder.    Speech Therapy Frequency  Other (comment) Discharge    Treatment/Interventions  Patient/family education;Other (comment) Voice therapy    Potential to Achieve Goals  Good    Potential Considerations  Ability to learn/carryover information;Pain level;Family/community support;Co-morbidities;Previous level of function;Cooperation/participation level;Severity of impairments;Medical prognosis    SLP Home Exercise Plan  Loud "ah" / Effortful pitch glides / Read/speak loudly X10 minutes     Consulted and Agree with Plan of Care  Patient;Family member/caregiver    Family Member Consulted  Spouse       Patient will benefit from skilled  therapeutic intervention in order to improve the following deficits and impairments:   Hypophonia    Problem List Patient Active Problem List   Diagnosis Date Noted  . Adult failure to thrive syndrome   . Weakness generalized   . Aspiration pneumonia ([place])   . Pressure injury of skin 11/25/2017  . DNR (do not resuscitate)   . Palliative care by specialist   . Dysphagia   . TB lung, latent   . Benign essential HTN   . Diabetes mellitus type 2 in nonobese ([facility])   . Acute blood loss anemia   . Altered mental status   . Cough   . Meningoencephalitis   . Seizure ([place])   . FUO (fever of unknown origin)   . Ischemic stroke ([place]) 11/19/2017  . AAA (abdominal aortic aneurysm) without rupture ([place]) 01/20/2017  . PAD (peripheral artery disease) ([place]) 01/20/2017  . CAD (coronary artery disease) 01/20/2017  . Essential hypertension 01/20/2017  . Hyperlipidemia 01/20/2017  . Chronic venous insufficiency 01/20/2017  . Venous stasis dermatitis 01/20/2017   [name], MS/CCC- SLP  Peter Becker 04/02/2018, 4:03 PM  [place] MAIN [facility] SERVICES [address] [place], [place], [address] Phone: [phone]   Fax:  [phone]   Name: Peter Becker MRN: [id number] Date of Birth: 03/26/1929

## 2018-04-29 ENCOUNTER — Encounter: Payer: Medicare Other | Admitting: Speech Pathology

## 2018-04-29 ENCOUNTER — Encounter: Payer: Medicare Other | Admitting: Occupational Therapy

## 2019-01-25 ENCOUNTER — Other Ambulatory Visit (INDEPENDENT_AMBULATORY_CARE_PROVIDER_SITE_OTHER): Payer: Medicare Other

## 2019-01-25 ENCOUNTER — Ambulatory Visit (INDEPENDENT_AMBULATORY_CARE_PROVIDER_SITE_OTHER): Payer: Medicare Other | Admitting: Vascular Surgery

## 2019-02-09 IMAGING — CT CT HEAD CODE STROKE
3 series · 14 of 47 positions shown, 16 images · non-contrast
Comparison: None

CLINICAL DATA: Code stroke.  Sudden onset dizziness and aphasia.

EXAM:
CT HEAD WITHOUT CONTRAST
TECHNIQUE: Contiguous axial images were obtained from the base of the skull
through the vertex without intravenous contrast.

[Series 2: head wo · axial · 0.47mm/px · z∈[-124,+1]mm · 8 of 30 slices shown, 10 images]
[im 3/30  brain]
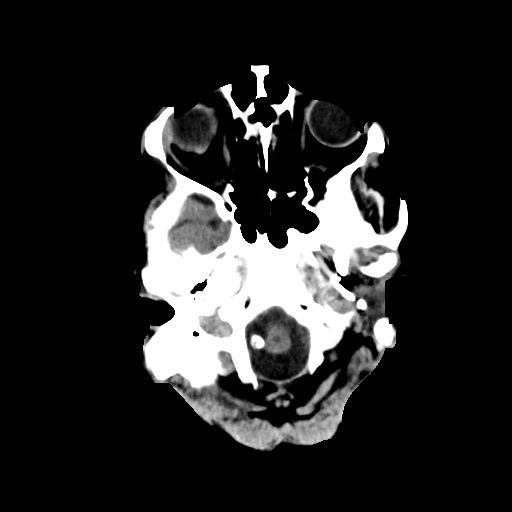
[im 3/30  bone]
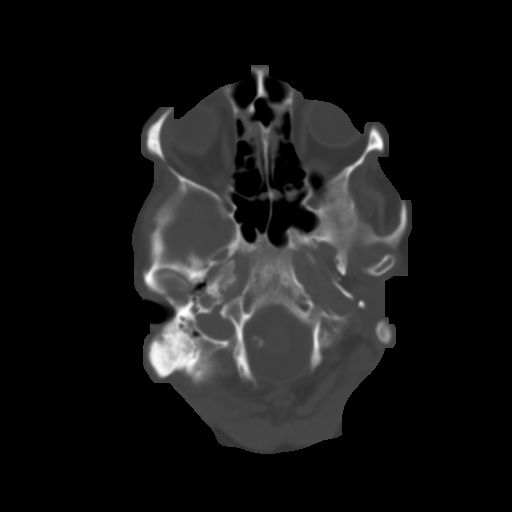
[im 7/30  brain]
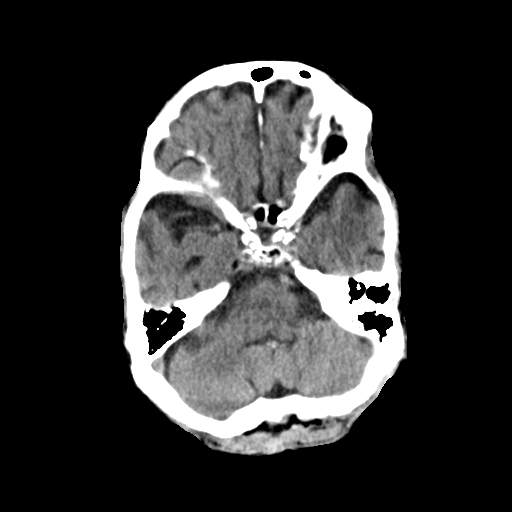
[im 10/30  brain]
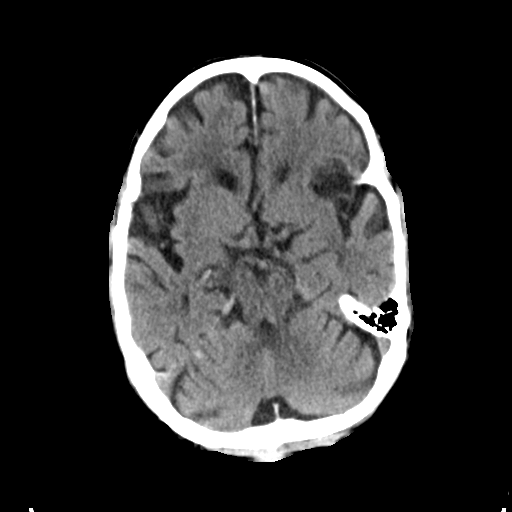
[im 14/30  brain]
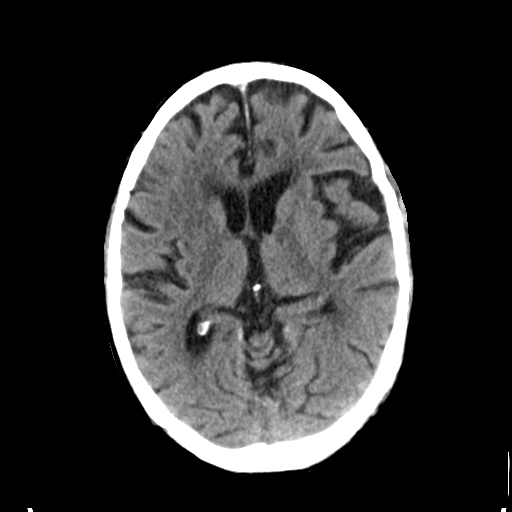
[im 17/30  brain]
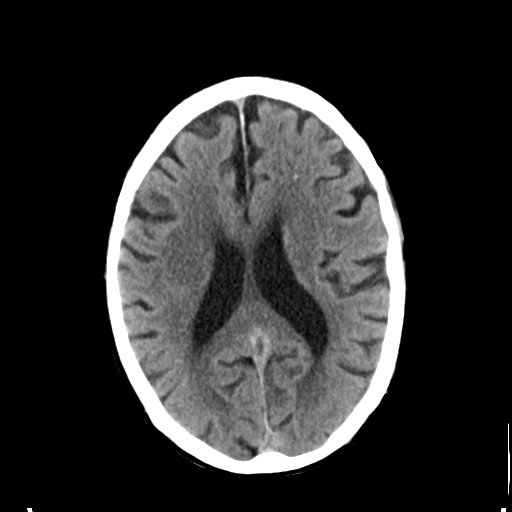
[im 17/30  bone]
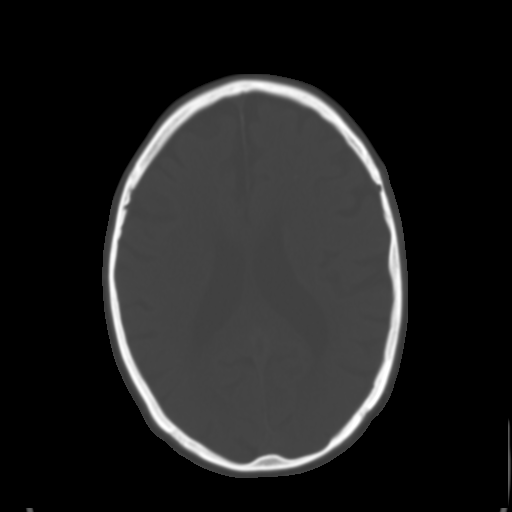
[im 21/30  brain]
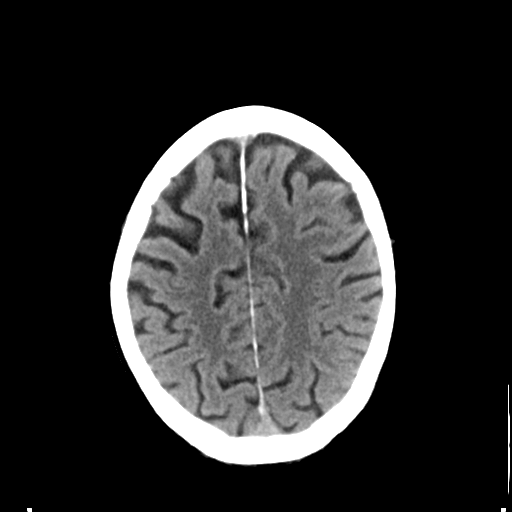
[im 24/30  brain]
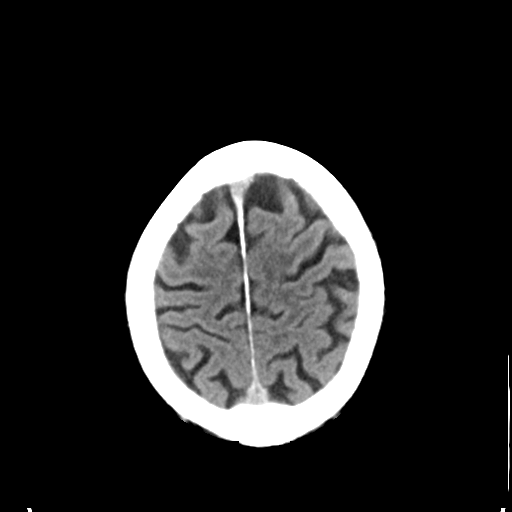
[im 28/30  brain]
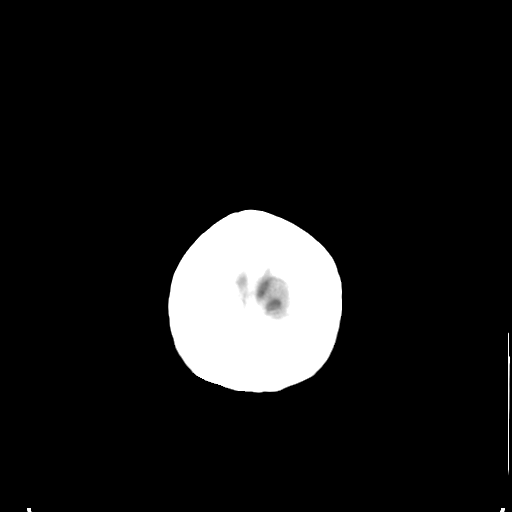

[Series 4: coronal soft tissue · coronal · 0.33mm/px · 3 of 66 slices shown]
[im 22/66  brain]
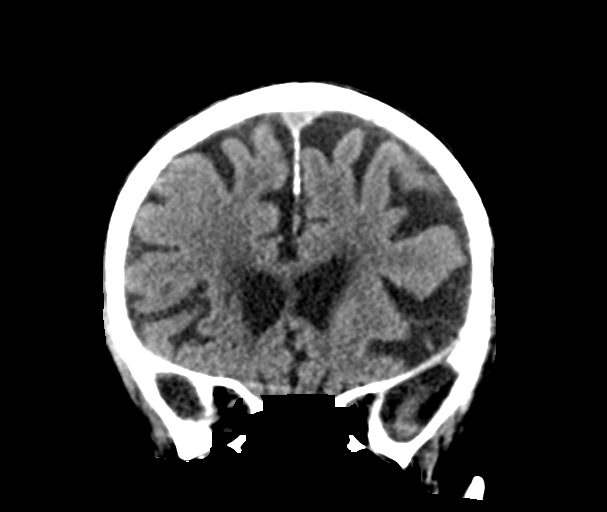
[im 29/66  brain]
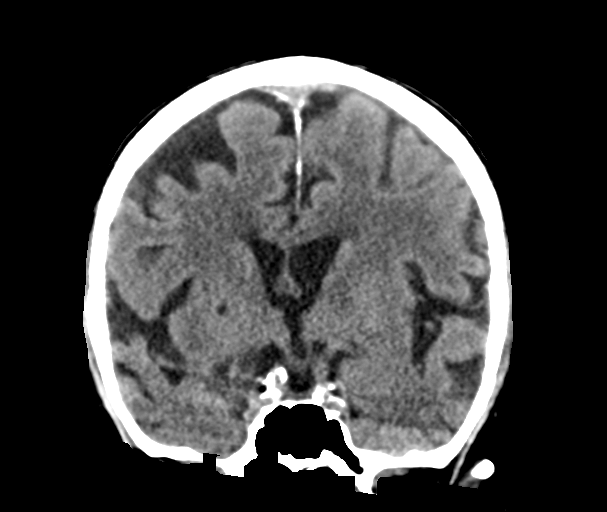
[im 37/66  brain]
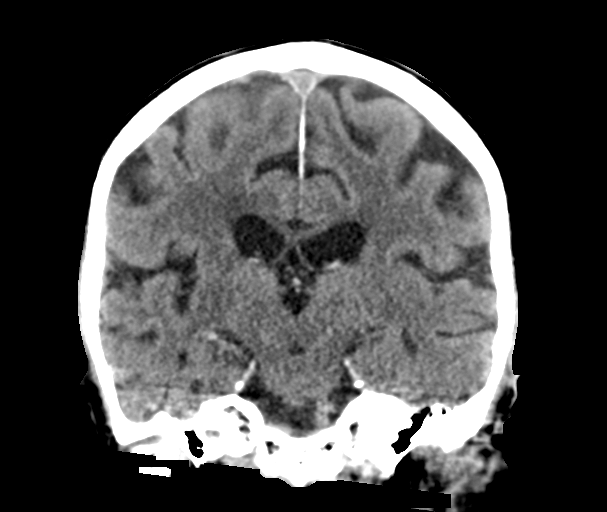

[Series 5: sagittal soft tissue · sagittal · 0.30mm/px · 3 of 51 slices shown]
[im 17/51  brain]
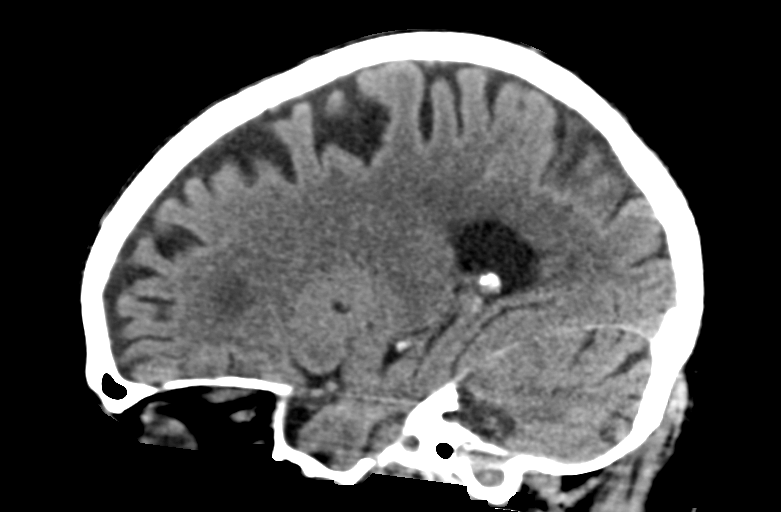
[im 26/51  brain]
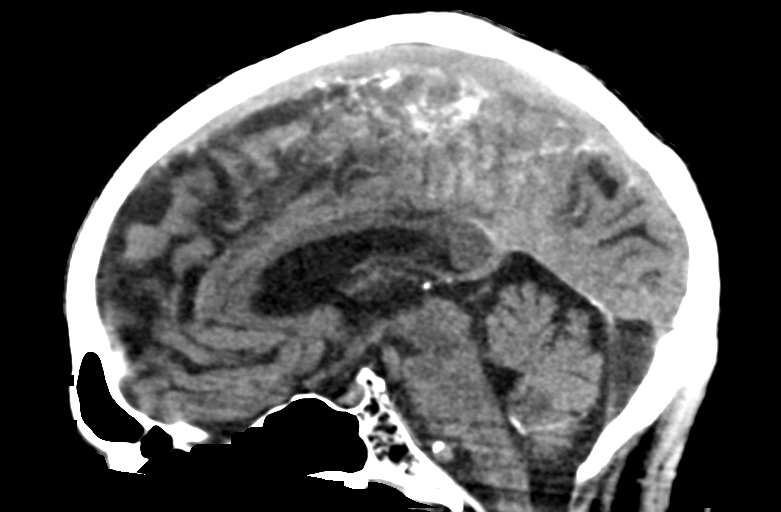
[im 34/51  brain]
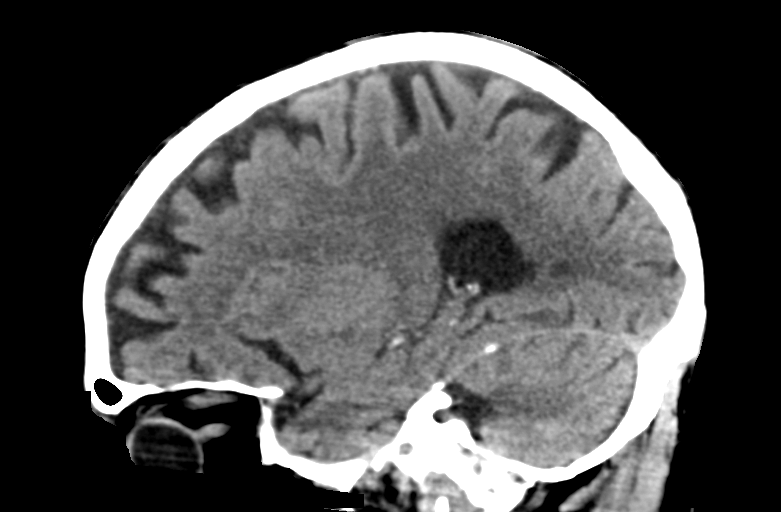

[14 of 47 positions shown; findings below may reference images not displayed]

FINDINGS: Brain: A 2 mm punctate density in the left frontal white matter is
favored to reflect a calcification. No definite intracranial
hemorrhage is identified. There is a chronic lacunar infarct in the
right lentiform nucleus. No acute cortically based infarct, mass,
midline shift, or extra-axial fluid collection is identified.
Periventricular white matter hypodensities are nonspecific but
compatible with mild chronic small vessel ischemic disease.
Generalized cerebral atrophy is relatively mild for age.

Vascular: Calcified atherosclerosis at the skullbase. No hyperdense
vessel.

Skull: No fracture or focal osseous lesion.

Sinuses/Orbits: Mild bilateral ethmoid air cell mucosal thickening.
Clear mastoid air cells. No acute findings in the included orbits.

Other: None.

ASPECTS (Alberta Stroke Program Early CT Score)

- Ganglionic level infarction (caudate, lentiform nuclei, internal
capsule, insula, M1-M3 cortex): 7

- Supraganglionic infarction (M4-M6 cortex): 3

Total score (0-10 with 10 being normal): 10
IMPRESSION: 1. No evidence of acute intracranial abnormality.
2. ASPECTS is 10.
3. Mild chronic small vessel ischemic disease and cerebral atrophy.
Chronic right basal ganglia lacunar infarct.

These results were called by telephone at the time of interpretation
on 11/19/2017 at [DATE] to Dr. GOSTA ACT , who verbally
acknowledged these results.

## 2020-01-14 ENCOUNTER — Ambulatory Visit: Payer: Medicare Other | Attending: Internal Medicine

## 2020-01-14 ENCOUNTER — Ambulatory Visit: Payer: Medicare Other

## 2020-01-14 DIAGNOSIS — Z23 Encounter for immunization: Secondary | ICD-10-CM | POA: Insufficient documentation

## 2020-01-14 NOTE — Progress Notes (Signed)
   Covid-19 Vaccination Clinic  Name:  Peter Becker    MRN: [id number] DOB: 20-Jul-1929  01/14/2020  Peter Becker was observed post Covid-19 immunization for 15 minutes without incidence. He was provided with Vaccine Information Sheet and instruction to access the V-Safe system.   Peter Becker was instructed to call 911 with any severe reactions post vaccine: Marland Kitchen Difficulty breathing  . Swelling of your face and throat  . A fast heartbeat  . A bad rash all over your body  . Dizziness and weakness    Immunizations Administered    Name Date Dose VIS Date Route   Pfizer COVID-19 Vaccine 01/14/2020 10:12 AM 0.3 mL 11/12/2019 Intramuscular   Manufacturer: [employer], [employer]   Lot: [id number]   NDC: [address]-9

## 2020-02-08 ENCOUNTER — Ambulatory Visit: Payer: Medicare Other | Attending: Internal Medicine

## 2020-02-08 DIAGNOSIS — Z23 Encounter for immunization: Secondary | ICD-10-CM | POA: Insufficient documentation

## 2020-02-08 NOTE — Progress Notes (Signed)
   Covid-19 Vaccination Clinic  Name:  Peter Becker    MRN: [id number] DOB: [birth year]  02/08/2020  [name] was observed post Covid-19 immunization for 15 minutes without incident. He was provided with Vaccine Information Sheet and instruction to access the V-Safe system.   [name] was instructed to call 911 with any severe reactions post vaccine: Marland Kitchen Difficulty breathing  . Swelling of face and throat  . A fast heartbeat  . A bad rash all over body  . Dizziness and weakness   Immunizations Administered    Name Date Dose VIS Date Route   Pfizer COVID-19 Vaccine 02/08/2020 11:08 AM 0.3 mL 11/12/2019 Intramuscular   Manufacturer: [employer], [employer]   Lot: [id number]   NDC: [address]-3

## 2023-02-06 ENCOUNTER — Inpatient Hospital Stay
Admission: EM | Admit: 2023-02-06 | Discharge: 2023-02-13 | DRG: 291 | Disposition: A | Discharge status: Home Health | Payer: Medicare Other | Attending: Internal Medicine | Admitting: Internal Medicine

## 2023-02-06 ENCOUNTER — Emergency Department: Payer: Medicare Other

## 2023-02-06 DIAGNOSIS — Z79899 Other long term (current) drug therapy: Secondary | ICD-10-CM

## 2023-02-06 DIAGNOSIS — T17998A Other foreign object in respiratory tract, part unspecified causing other injury, initial encounter: Secondary | ICD-10-CM | POA: Diagnosis not present

## 2023-02-06 DIAGNOSIS — Z7984 Long term (current) use of oral hypoglycemic drugs: Secondary | ICD-10-CM

## 2023-02-06 DIAGNOSIS — Z66 Do not resuscitate: Secondary | ICD-10-CM | POA: Diagnosis present

## 2023-02-06 DIAGNOSIS — Z951 Presence of aortocoronary bypass graft: Secondary | ICD-10-CM | POA: Diagnosis not present

## 2023-02-06 DIAGNOSIS — Z7982 Long term (current) use of aspirin: Secondary | ICD-10-CM

## 2023-02-06 DIAGNOSIS — R54 Age-related physical debility: Secondary | ICD-10-CM | POA: Diagnosis present

## 2023-02-06 DIAGNOSIS — E1129 Type 2 diabetes mellitus with other diabetic kidney complication: Secondary | ICD-10-CM | POA: Diagnosis present

## 2023-02-06 DIAGNOSIS — F419 Anxiety disorder, unspecified: Secondary | ICD-10-CM | POA: Diagnosis present

## 2023-02-06 DIAGNOSIS — I452 Bifascicular block: Secondary | ICD-10-CM | POA: Diagnosis present

## 2023-02-06 DIAGNOSIS — J9601 Acute respiratory failure with hypoxia: Secondary | ICD-10-CM | POA: Diagnosis present

## 2023-02-06 DIAGNOSIS — E1122 Type 2 diabetes mellitus with diabetic chronic kidney disease: Secondary | ICD-10-CM | POA: Diagnosis present

## 2023-02-06 DIAGNOSIS — Z87891 Personal history of nicotine dependence: Secondary | ICD-10-CM

## 2023-02-06 DIAGNOSIS — J9811 Atelectasis: Secondary | ICD-10-CM | POA: Diagnosis present

## 2023-02-06 DIAGNOSIS — E1151 Type 2 diabetes mellitus with diabetic peripheral angiopathy without gangrene: Secondary | ICD-10-CM | POA: Diagnosis present

## 2023-02-06 DIAGNOSIS — I5043 Acute on chronic combined systolic (congestive) and diastolic (congestive) heart failure: Secondary | ICD-10-CM | POA: Diagnosis present

## 2023-02-06 DIAGNOSIS — E860 Dehydration: Secondary | ICD-10-CM | POA: Diagnosis present

## 2023-02-06 DIAGNOSIS — I5033 Acute on chronic diastolic (congestive) heart failure: Secondary | ICD-10-CM | POA: Diagnosis not present

## 2023-02-06 DIAGNOSIS — I081 Rheumatic disorders of both mitral and tricuspid valves: Secondary | ICD-10-CM | POA: Diagnosis present

## 2023-02-06 DIAGNOSIS — I209 Angina pectoris, unspecified: Secondary | ICD-10-CM

## 2023-02-06 DIAGNOSIS — I2489 Other forms of acute ischemic heart disease: Secondary | ICD-10-CM

## 2023-02-06 DIAGNOSIS — Y92239 Unspecified place in hospital as the place of occurrence of the external cause: Secondary | ICD-10-CM | POA: Diagnosis not present

## 2023-02-06 DIAGNOSIS — R1310 Dysphagia, unspecified: Secondary | ICD-10-CM | POA: Diagnosis present

## 2023-02-06 DIAGNOSIS — E1165 Type 2 diabetes mellitus with hyperglycemia: Secondary | ICD-10-CM | POA: Diagnosis present

## 2023-02-06 DIAGNOSIS — I252 Old myocardial infarction: Secondary | ICD-10-CM

## 2023-02-06 DIAGNOSIS — E785 Hyperlipidemia, unspecified: Secondary | ICD-10-CM | POA: Diagnosis not present

## 2023-02-06 DIAGNOSIS — I5031 Acute diastolic (congestive) heart failure: Secondary | ICD-10-CM | POA: Diagnosis not present

## 2023-02-06 DIAGNOSIS — E11649 Type 2 diabetes mellitus with hypoglycemia without coma: Secondary | ICD-10-CM | POA: Diagnosis not present

## 2023-02-06 DIAGNOSIS — I2510 Atherosclerotic heart disease of native coronary artery without angina pectoris: Secondary | ICD-10-CM | POA: Diagnosis present

## 2023-02-06 DIAGNOSIS — I639 Cerebral infarction, unspecified: Secondary | ICD-10-CM | POA: Diagnosis not present

## 2023-02-06 DIAGNOSIS — I214 Non-ST elevation (NSTEMI) myocardial infarction: Principal | ICD-10-CM

## 2023-02-06 DIAGNOSIS — H9190 Unspecified hearing loss, unspecified ear: Secondary | ICD-10-CM | POA: Diagnosis present

## 2023-02-06 DIAGNOSIS — Z1152 Encounter for screening for COVID-19: Secondary | ICD-10-CM

## 2023-02-06 DIAGNOSIS — Z955 Presence of coronary angioplasty implant and graft: Secondary | ICD-10-CM

## 2023-02-06 DIAGNOSIS — I5A Non-ischemic myocardial injury (non-traumatic): Secondary | ICD-10-CM

## 2023-02-06 DIAGNOSIS — I130 Hypertensive heart and chronic kidney disease with heart failure and stage 1 through stage 4 chronic kidney disease, or unspecified chronic kidney disease: Secondary | ICD-10-CM | POA: Diagnosis present

## 2023-02-06 DIAGNOSIS — I5023 Acute on chronic systolic (congestive) heart failure: Secondary | ICD-10-CM

## 2023-02-06 DIAGNOSIS — Z9049 Acquired absence of other specified parts of digestive tract: Secondary | ICD-10-CM

## 2023-02-06 DIAGNOSIS — I10 Essential (primary) hypertension: Secondary | ICD-10-CM | POA: Diagnosis present

## 2023-02-06 DIAGNOSIS — J90 Pleural effusion, not elsewhere classified: Secondary | ICD-10-CM

## 2023-02-06 DIAGNOSIS — I25118 Atherosclerotic heart disease of native coronary artery with other forms of angina pectoris: Secondary | ICD-10-CM | POA: Diagnosis not present

## 2023-02-06 DIAGNOSIS — R159 Full incontinence of feces: Secondary | ICD-10-CM | POA: Diagnosis present

## 2023-02-06 DIAGNOSIS — Z8673 Personal history of transient ischemic attack (TIA), and cerebral infarction without residual deficits: Secondary | ICD-10-CM

## 2023-02-06 DIAGNOSIS — N1831 Chronic kidney disease, stage 3a: Secondary | ICD-10-CM

## 2023-02-06 DIAGNOSIS — Z8679 Personal history of other diseases of the circulatory system: Secondary | ICD-10-CM

## 2023-02-06 DIAGNOSIS — R0602 Shortness of breath: Secondary | ICD-10-CM

## 2023-02-06 DIAGNOSIS — J918 Pleural effusion in other conditions classified elsewhere: Secondary | ICD-10-CM | POA: Diagnosis present

## 2023-02-06 DIAGNOSIS — Z8249 Family history of ischemic heart disease and other diseases of the circulatory system: Secondary | ICD-10-CM

## 2023-02-06 LAB — TROPONIN I (HIGH SENSITIVITY)
Troponin I (High Sensitivity): 205 ng/L (ref <18)
Troponin I (High Sensitivity): 173 ng/L (ref <18)

## 2023-02-06 LAB — BASIC METABOLIC PANEL
Anion gap: 8 (ref 5–15)
BUN: 34 mg/dL — ABNORMAL HIGH (ref 8–23)
CO2: 27 mmol/L (ref 22–32)
Calcium: 8.7 mg/dL — ABNORMAL LOW (ref 8.9–10.3)
Chloride: 107 mmol/L (ref 98–111)
Creatinine, Ser: 1.44 mg/dL — ABNORMAL HIGH (ref 0.61–1.24)
GFR, Estimated: 46 mL/min — ABNORMAL LOW (ref >60)
Glucose, Bld: 321 mg/dL — ABNORMAL HIGH (ref 70–99)
Potassium: 4.4 mmol/L (ref 3.5–5.1)
Sodium: 142 mmol/L (ref 135–145)

## 2023-02-06 LAB — APTT: aPTT: 27 seconds (ref 24–36)

## 2023-02-06 LAB — RESP PANEL BY RT-PCR (RSV, FLU A&B, COVID)  RVPGX2
Influenza A by PCR: NEGATIVE
Influenza B by PCR: NEGATIVE
Resp Syncytial Virus by PCR: NEGATIVE
SARS Coronavirus 2 by RT PCR: NEGATIVE

## 2023-02-06 LAB — PROTIME-INR
INR: 1.1 (ref 0.8–1.2)
Prothrombin Time: 14.3 seconds (ref 11.4–15.2)

## 2023-02-06 LAB — CBC
HCT: 41 % (ref 39.0–52.0)
Hemoglobin: 12.9 g/dL — ABNORMAL LOW (ref 13.0–17.0)
MCH: 31.9 pg (ref 26.0–34.0)
MCHC: 31.5 g/dL (ref 30.0–36.0)
MCV: 101.2 fL — ABNORMAL HIGH (ref 80.0–100.0)
Platelets: 205 10*3/uL (ref 150–400)
RBC: 4.05 MIL/uL — ABNORMAL LOW (ref 4.22–5.81)
RDW: 13.1 % (ref 11.5–15.5)
WBC: 8 10*3/uL (ref 4.0–10.5)
nRBC: 0 % (ref 0.0–0.2)

## 2023-02-06 LAB — BRAIN NATRIURETIC PEPTIDE: B Natriuretic Peptide: 1008.1 pg/mL — ABNORMAL HIGH (ref 0.0–100.0)

## 2023-02-06 LAB — CBG MONITORING, ED
Glucose-Capillary: 270 mg/dL — ABNORMAL HIGH (ref 70–99)
Glucose-Capillary: 112 mg/dL — ABNORMAL HIGH (ref 70–99)

## 2023-02-06 MED ORDER — HYDRALAZINE HCL 20 MG/ML IJ SOLN: 5.0000 mg | INTRAMUSCULAR | Status: DC | PRN

## 2023-02-06 MED ORDER — ALBUTEROL SULFATE (2.5 MG/3ML) 0.083% IN NEBU: 2.5000 mg | INHALATION_SOLUTION | RESPIRATORY_TRACT | Status: DC | PRN

## 2023-02-06 MED ORDER — INSULIN ASPART 100 UNIT/ML IJ SOLN: 0.0000 [IU] | Freq: Every day | INTRAMUSCULAR | Status: DC

## 2023-02-06 MED ORDER — FUROSEMIDE 10 MG/ML IJ SOLN: 20.0000 mg | Freq: Once | INTRAMUSCULAR | Status: DC

## 2023-02-06 MED ORDER — DM-GUAIFENESIN ER 30-600 MG PO TB12: 1.0000 | ORAL_TABLET | Freq: Two times a day (BID) | ORAL | Status: DC | PRN

## 2023-02-06 MED ORDER — ASPIRIN 81 MG PO CHEW
324.0000 mg | CHEWABLE_TABLET | Freq: Once | ORAL | Status: AC
  Administered 2023-02-06: 324 mg via ORAL
  Filled 2023-02-06: qty 4

## 2023-02-06 MED ORDER — FUROSEMIDE 10 MG/ML IJ SOLN
20.0000 mg | Freq: Once | INTRAMUSCULAR | Status: AC
  Administered 2023-02-06: 20 mg via INTRAVENOUS
  Filled 2023-02-06: qty 4

## 2023-02-06 MED ORDER — LOSARTAN POTASSIUM 25 MG PO TABS
25.0000 mg | ORAL_TABLET | Freq: Every day | ORAL | Status: DC
  Administered 2023-02-07 – 2023-02-08 (×2): 25 mg via ORAL
  Filled 2023-02-06 (×2): qty 1

## 2023-02-06 MED ORDER — ASPIRIN 81 MG PO TBEC
81.0000 mg | DELAYED_RELEASE_TABLET | Freq: Every day | ORAL | Status: DC
  Administered 2023-02-07 – 2023-02-13 (×6): 81 mg via ORAL
  Filled 2023-02-06 (×6): qty 1

## 2023-02-06 MED ORDER — HEPARIN (PORCINE) 25000 UT/250ML-% IV SOLN
850.0000 [IU]/h | INTRAVENOUS | Status: AC
  Administered 2023-02-06 – 2023-02-07 (×2): 700 [IU]/h via INTRAVENOUS
  Filled 2023-02-06 (×2): qty 250

## 2023-02-06 MED ORDER — FUROSEMIDE 10 MG/ML IJ SOLN
40.0000 mg | Freq: Two times a day (BID) | INTRAMUSCULAR | Status: DC
  Administered 2023-02-07 – 2023-02-08 (×3): 40 mg via INTRAVENOUS
  Filled 2023-02-06 (×3): qty 4

## 2023-02-06 MED ORDER — FUROSEMIDE 10 MG/ML IJ SOLN
40.0000 mg | Freq: Two times a day (BID) | INTRAMUSCULAR | Status: DC
  Administered 2023-02-06: 20 mg via INTRAVENOUS
  Filled 2023-02-06: qty 4

## 2023-02-06 MED ORDER — INSULIN ASPART 100 UNIT/ML IJ SOLN
0.0000 [IU] | Freq: Three times a day (TID) | INTRAMUSCULAR | Status: DC
  Administered 2023-02-06: 5 [IU] via SUBCUTANEOUS
  Filled 2023-02-06: qty 1

## 2023-02-06 MED ORDER — METOPROLOL TARTRATE 25 MG PO TABS
12.5000 mg | ORAL_TABLET | Freq: Two times a day (BID) | ORAL | Status: DC
  Administered 2023-02-06 – 2023-02-08 (×4): 12.5 mg via ORAL
  Filled 2023-02-06 (×4): qty 1

## 2023-02-06 MED ORDER — HEPARIN BOLUS VIA INFUSION
3500.0000 [IU] | Freq: Once | INTRAVENOUS | Status: AC
  Administered 2023-02-06: 3500 [IU] via INTRAVENOUS
  Filled 2023-02-06: qty 3500

## 2023-02-06 MED ORDER — LORAZEPAM 0.5 MG PO TABS: 0.5000 mg | ORAL_TABLET | Freq: Four times a day (QID) | ORAL | Status: DC | PRN

## 2023-02-06 MED ORDER — DIPHENHYDRAMINE HCL 50 MG/ML IJ SOLN: 12.5000 mg | Freq: Three times a day (TID) | INTRAMUSCULAR | Status: DC | PRN

## 2023-02-06 MED ORDER — NITROGLYCERIN 0.4 MG SL SUBL: 0.4000 mg | SUBLINGUAL_TABLET | SUBLINGUAL | Status: DC | PRN

## 2023-02-06 MED ORDER — ACETAMINOPHEN 325 MG PO TABS: 650.0000 mg | ORAL_TABLET | Freq: Four times a day (QID) | ORAL | Status: DC | PRN

## 2023-02-06 MED ORDER — INSULIN GLARGINE-YFGN 100 UNIT/ML ~~LOC~~ SOLN
5.0000 [IU] | Freq: Every day | SUBCUTANEOUS | Status: DC
  Administered 2023-02-06 – 2023-02-07 (×2): 5 [IU] via SUBCUTANEOUS
  Filled 2023-02-06 (×3): qty 0.05

## 2023-02-06 MED ORDER — TAMSULOSIN HCL 0.4 MG PO CAPS
0.4000 mg | ORAL_CAPSULE | Freq: Every day | ORAL | Status: DC
  Administered 2023-02-07 – 2023-02-13 (×6): 0.4 mg via ORAL
  Filled 2023-02-06 (×6): qty 1

## 2023-02-06 MED ORDER — ATORVASTATIN CALCIUM 20 MG PO TABS
40.0000 mg | ORAL_TABLET | Freq: Every day | ORAL | Status: DC
  Administered 2023-02-06 – 2023-02-13 (×7): 40 mg via ORAL
  Filled 2023-02-06 (×7): qty 2

## 2023-02-06 NOTE — ED Provider Notes (Signed)
[facility] Provider Note    Event Date/Time   First MD Initiated Contact with Patient 02/06/23 1457     (approximate)   History   Chief Complaint: Shortness of Breath   HPI  Peter Becker is a 89 y.o. male with a history of hypertension diabetes CAD status post CABG in 2008 who comes the ED complaining of shortness of breath with walking for the past 2 to 4 weeks, worsening.  Associated with chest tightness.  Currently patient states he feels fine at rest and denies any symptoms.  No fever or cough or leg swelling.     Physical Exam   Triage Vital Signs: ED Triage Vitals  Enc Vitals Group     BP 02/06/23 1223 139/75     Pulse Rate 02/06/23 1223 90     Resp 02/06/23 1223 19     Temp 02/06/23 1223 98.7 F (37.1 C)     Temp Source 02/06/23 1223 Oral     SpO2 02/06/23 1223 90 %     Weight 02/06/23 1215 135 lb (61.2 kg)     Height --      Head Circumference --      Peak Flow --      Pain Score 02/06/23 1510 0     Pain Loc --      Pain Edu? --      Excl. in [place]? --     Most recent vital signs: Vitals:   02/06/23 1510 02/06/23 1511  BP:    Pulse:    Resp:    Temp:    SpO2: (!) 88% 98%    General: Awake, no distress.  CV:  Good peripheral perfusion.  Regular rate and rhythm Resp:  Normal effort.  Diminished breath sounds left base Abd:  No distention.  Soft nontender Other:  No lower extremity edema.  Somewhat dry mucous membranes   ED Results / Procedures / Treatments   Labs (all labs ordered are listed, but only abnormal results are displayed) Labs Reviewed  BASIC METABOLIC PANEL - Abnormal; Notable for the following components:      Result Value   Glucose, Bld 321 (*)    BUN 34 (*)    Creatinine, Ser 1.44 (*)    Calcium 8.7 (*)    GFR, Estimated 46 (*)    All other components within normal limits  CBC - Abnormal; Notable for the following components:   RBC 4.05 (*)    Hemoglobin 12.9 (*)    MCV 101.2 (*)    All other  components within normal limits  BRAIN NATRIURETIC PEPTIDE - Abnormal; Notable for the following components:   B Natriuretic Peptide 1,008.1 (*)    All other components within normal limits  TROPONIN I (HIGH SENSITIVITY) - Abnormal; Notable for the following components:   Troponin I (High Sensitivity) 205 (*)    All other components within normal limits  RESP PANEL BY RT-PCR (RSV, FLU A&B, COVID)  RVPGX2  APTT  PROTIME-INR  HEPARIN LEVEL (UNFRACTIONATED)  TROPONIN I (HIGH SENSITIVITY)     EKG Interpreted by me Sinus rhythm rate of 93, left axis, prolonged QTc of 509 ms.  Right bundle branch block.  No acute ischemic changes.   RADIOLOGY Chest x-ray interpreted by me, shows small bilateral pleural effusions, radiology report reviewed   PROCEDURES:  .Critical Care  Performed by: Carrie Mew, MD Authorized by: Carrie Mew, MD   Critical care provider statement:    Critical care  time (minutes):  35   Critical care time was exclusive of:  Separately billable procedures and treating other patients   Critical care was necessary to treat or prevent imminent or life-threatening deterioration of the following conditions:  Cardiac failure   Critical care was time spent personally by me on the following activities:  Development of treatment plan with patient or surrogate, discussions with consultants, evaluation of patient's response to treatment, examination of patient, obtaining history from patient or surrogate, ordering and performing treatments and interventions, ordering and review of laboratory studies, ordering and review of radiographic studies, pulse oximetry, re-evaluation of patient's condition and review of old charts   Care discussed with: admitting provider   Comments:           MEDICATIONS ORDERED IN ED: Medications  heparin bolus via infusion 3,500 Units (3,500 Units Intravenous Bolus from Bag 02/06/23 1621)    Followed by  heparin ADULT infusion 100  units/mL (25000 units/2102m) (700 Units/hr Intravenous New Bag/Given 02/06/23 1620)  furosemide (LASIX) injection 20 mg (20 mg Intravenous Given 02/06/23 1614)  aspirin chewable tablet 324 mg (324 mg Oral Given 02/06/23 1612)     IMPRESSION / MDM / [place]/ ED COURSE  I reviewed the triage vital signs and the nursing notes.  DDx: Pneumonia, pleural effusion, pulmonary edema, non-STEMI, angina pectoris, new onset heart failure, electrolyte abnormality, anemia, UTI, viral illness/COVID/flu  Patient's presentation is most consistent with acute presentation with potential threat to life or bodily function.  Patient presents with dyspnea on exertion associated with chest pain.  Currently symptoms are resolved at rest.  Oxygenation is borderline at 90% on room air, will start him on 2 L nasal cannula.  Chest x-ray shows pleural effusions and he does have decreased air movement on exam.  Additionally, BNP is elevated and troponin is 200, concerning for NSTEMI.  Doubt PE or pericardial effusion.  Will start heparin and plan to admit for further management.       FINAL CLINICAL IMPRESSION(S) / ED DIAGNOSES   Final diagnoses:  NSTEMI (non-ST elevated myocardial infarction) ([employer]     Rx / DC Orders   ED Discharge Orders     None        Note:  This document was prepared using Dragon voice recognition software and may include unintentional dictation errors.   SCarrie Mew MD 02/06/23 [phone]

## 2023-02-06 NOTE — ED Triage Notes (Signed)
Pt daughter sts that pt has been having SOB for the last month. Per daughter pt has been in contact with his PCP and they have been adjusting his medication however it has not been working. Daughter sts that when pt starts to walk he gets very SOB and weak.

## 2023-02-06 NOTE — Consult Note (Signed)
[place] for heparin drip Indication: ACS/NSTEMI  Allergies  Allergen Reactions   Tape Other (See Comments)    SKIN IS VERY THIN AND TEARS AND BRUISES EASILY; Please use an alternative!!    Patient Measurements: Weight: 61.2 kg (135 lb) Heparin Dosing Weight: 61.2  Vital Signs: Temp: 98.7 F (37.1 C) (03/07 1223) Temp Source: Oral (03/07 1223) BP: 139/75 (03/07 1223) Pulse Rate: 90 (03/07 1223)  Labs: Recent Labs    02/06/23 1221  HGB 12.9*  HCT 41.0  PLT 205  CREATININE 1.44*  TROPONINIHS 205*    CrCl cannot be calculated (Unknown ideal weight.).   Medical History: Past Medical History:  Diagnosis Date   Diabetes mellitus without complication ([facility])    Hypertension    Myocardial infarct ([place])     Medications:  No home anticoagulation per pharmacist review  Assessment: 89 yo male presenting to ED for complaint of shortness of breath and weakness.  PMH includes CHF, DM, CVA, and ASCVD.  Found to have elevated troponin.  Pharmacy consulted to initiate heparin drip.  Baseline labs: hgb 12.9, hct 41, plt 205, PT/INR pending, aPTT pending  Goal of Therapy:  Heparin level 0.3-0.7 units/ml Monitor platelets by anticoagulation protocol: Yes   Plan:  Give 3500 units bolus x 1 Start heparin infusion at 700 units/hr Check anti-Xa level in 8 hours and daily while on heparin Continue to monitor H&H and platelets  Lorin Picket, PharmD 02/06/2023,3:20 PM

## 2023-02-06 NOTE — H&P (Signed)
History and Physical    Peter Becker [id number] DOB: 1929/02/25 DOA: 02/06/2023  Referring MD/NP/PA:   PCP: Rusty Aus, MD   Patient coming from:  The patient is coming from home.     Chief Complaint: SOB  HPI: Peter Becker is a 89 y.o. male with medical history significant of sCHF with EF 45%, HTN, HLD, DM, CAD, CABG, PVD, stroke, anxiety, CKD-3a, who presents with SOB.  Patient states that he has shortness breath for almost 1 month, which has been progressively worsening in the past several days, particularly on exertion.  Patient reported to ED physician that he has exertional chest tightness, but denies chest pain to me.  Patient has dry cough, no fever or chills.  Patient has generalized weakness.  He has loose stool, no active diarrhea, nausea vomiting, abdominal pain.  No symptoms of UTI.  No recent fall or head injury.  No dark stool or rectal bleeding.  Data reviewed independently and ED Course: pt was found to have  trop  205,  BNP 1008, WBC 8.0, stable renal function, temperature normal, blood pressure 147/84, heart rate 67, RR 19, oxygen saturation 88% on room air which improved to 99% on 2 L oxygen.  Chest x-ray showed bilateral pleural effusion.  Patient is admitted to telemetry bed as inpatient.  Dr. Rockey Situ of card is consulted.   EKG: I have personally reviewed.  Sinus rhythm, QTc 509, bifascicular block   Review of Systems:   General: no fevers, chills, no body weight gain, has fatigue HEENT: no blurry vision, hearing changes or sore throat Respiratory: has dyspnea, coughing, no wheezing CV: no chest pain, no palpitations GI: no nausea, vomiting, abdominal pain, diarrhea, constipation GU: no dysuria, burning on urination, increased urinary frequency, hematuria  Ext: has leg edema Neuro: no unilateral weakness, numbness, or tingling, no vision change or hearing loss Skin: no rash, no skin tear. MSK: No muscle spasm, no deformity, no limitation of range of  movement in spin Heme: No easy bruising.  Travel history: No recent long distant travel.   Allergy:  Allergies  Allergen Reactions   Tape Other (See Comments)    SKIN IS VERY THIN AND TEARS AND BRUISES EASILY; Please use an alternative!!    Past Medical History:  Diagnosis Date   Diabetes mellitus without complication ([place])    Hypertension    Myocardial infarct [facility])     Past Surgical History:  Procedure Laterality Date   CARPAL TUNNEL RELEASE Left    CHOLECYSTECTOMY     EYE SURGERY     heart stent     triple bypass   HEMORRHOID SURGERY     TONSILLECTOMY      Social History:  reports that he has quit smoking. He has never used smokeless tobacco. He reports that he does not drink alcohol and does not use drugs.  Family History:  Family History  Problem Relation Age of Onset   Heart attack Father      Prior to Admission medications   Medication Sig Start Date End Date Taking? Authorizing Provider  acetaminophen (TYLENOL) 650 MG suppository Place 1 suppository (650 mg total) rectally every 6 (six) hours as needed for fever. 11/27/17   Raiford Noble Latif, DO  chlorhexidine (PERIDEX) 0.12 % solution 15 mLs by Mouth Rinse route 2 (two) times daily. 11/27/17   Raiford Noble Latif, DO  cyanocobalamin (,VITAMIN B-12,) 1000 MCG/ML injection INJ 1 ML IM Q 14 DAYS 12/29/17   [provider]  glyBURIDE-metformin (GLUCOVANCE) 2.5-500 MG tablet  01/19/18   [provider]  glycopyrrolate (ROBINUL) 0.2 MG/ML injection Inject 2 mLs (0.4 mg total) into the vein 3 (three) times daily. 11/27/17   Sheikh, Omair Latif, DO  ipratropium-albuterol (DUONEB) 0.5-2.5 (3) MG/3ML SOLN Take 3 mLs by nebulization every 4 (four) hours as needed. 11/27/17   Sheikh, Georgina Quint Latif, DO  lisinopril (PRINIVIL,ZESTRIL) 10 MG tablet Take by mouth.    [provider]  LORazepam (ATIVAN) 2 MG/ML injection Inject 0.5 mLs (1 mg total) into the vein every 4 (four) hours as needed for  anxiety. 11/27/17   Raiford Noble Latif, DO  losartan (COZAAR) 50 MG tablet  12/29/17   [provider]  metoprolol tartrate (LOPRESSOR) 25 MG tablet  01/19/18   [provider]  morphine 2 MG/ML injection Inject 1 mL (2 mg total) into the vein every 2 (two) hours as needed (dyspnea). Patient not taking: Reported on 01/26/2018 11/27/17   Raiford Noble Latif, DO  sodium chloride 0.9 % SOLN 100 mL with valproate 500 MG/5ML SOLN 500 mg Inject 500 mg into the vein every 8 (eight) hours. Patient not taking: Reported on 01/26/2018 11/27/17   Kerney Elbe, DO    Physical Exam: Vitals:   02/06/23 1510 02/06/23 1511 02/06/23 1600 02/06/23 1800  BP:   (!) 147/84 136/80  Pulse:   67 82  Resp:   19 19  Temp:      TempSrc:      SpO2: (!) 88% 98% 99% 95%  Weight:       General: Not in acute distress HEENT:       Eyes: PERRL, EOMI, no scleral icterus.       ENT: No discharge from the ears and nose, no pharynx injection, no tonsillar enlargement.        Neck: positive JVD, no bruit, no mass felt. Heme: No neck lymph node enlargement. Cardiac: S1/S2, RRR, No murmurs, No gallops or rubs. Respiratory: has fine crackles bilaterally GI: Soft, nondistended, nontender, no rebound pain, no organomegaly, BS present. GU: No hematuria Ext: 1+ pitting leg edema bilaterally. 1+DP/PT pulse bilaterally. Musculoskeletal: No joint deformities, No joint redness or warmth, no limitation of ROM in spin. Skin: No rashes.  Neuro: Alert, oriented X3, cranial nerves II-XII grossly intact, moves all extremities normally. Psych: Patient is not psychotic, no suicidal or hemocidal ideation.  Labs on Admission: I have personally reviewed following labs and imaging studies  CBC: Recent Labs  Lab 02/06/23 1221  WBC 8.0  HGB 12.9*  HCT 41.0  MCV 101.2*  PLT 99991111   Basic Metabolic Panel: Recent Labs  Lab 02/06/23 1221  NA 142  K 4.4  CL 107  CO2 27  GLUCOSE 321*  BUN 34*  CREATININE  1.44*  CALCIUM 8.7*   GFR: CrCl cannot be calculated (Unknown ideal weight.). Liver Function Tests: No results for input(s): "AST", "ALT", "ALKPHOS", "BILITOT", "PROT", "ALBUMIN" in the last 168 hours. No results for input(s): "LIPASE", "AMYLASE" in the last 168 hours. No results for input(s): "AMMONIA" in the last 168 hours. Coagulation Profile: Recent Labs  Lab 02/06/23 1610  INR 1.1   Cardiac Enzymes: No results for input(s): "CKTOTAL", "CKMB", "CKMBINDEX", "TROPONINI" in the last 168 hours. BNP (last 3 results) No results for input(s): "PROBNP" in the last 8760 hours. HbA1C: No results for input(s): "HGBA1C" in the last 72 hours. CBG: Recent Labs  Lab 02/06/23 1716  GLUCAP 270*   Lipid Profile: No results for input(s): "  CHOL", "HDL", "LDLCALC", "TRIG", "CHOLHDL", "LDLDIRECT" in the last 72 hours. Thyroid Function Tests: No results for input(s): "TSH", "T4TOTAL", "FREET4", "T3FREE", "THYROIDAB" in the last 72 hours. Anemia Panel: No results for input(s): "VITAMINB12", "FOLATE", "FERRITIN", "TIBC", "IRON", "RETICCTPCT" in the last 72 hours. Urine analysis:    Component Value Date/Time   COLORURINE YELLOW 11/23/2017 1435   APPEARANCEUR CLEAR 11/23/2017 1435   LABSPEC 1.025 11/23/2017 1435   PHURINE 5.0 11/23/2017 1435   GLUCOSEU 50 (A) 11/23/2017 1435   HGBUR NEGATIVE 11/23/2017 1435   BILIRUBINUR NEGATIVE 11/23/2017 1435   KETONESUR 5 (A) 11/23/2017 1435   PROTEINUR 100 (A) 11/23/2017 1435   NITRITE NEGATIVE 11/23/2017 1435   LEUKOCYTESUR NEGATIVE 11/23/2017 1435   Sepsis Labs: '@LABRCNTIP'$ (procalcitonin:4,lacticidven:4) ) Recent Results (from the past 240 hour(s))  Resp panel by RT-PCR (RSV, Flu A&B, Covid) Anterior Nasal Swab     Status: None   Collection Time: 02/06/23 12:21 PM   Specimen: Anterior Nasal Swab  Result Value Ref Range Status   SARS Coronavirus 2 by RT PCR NEGATIVE NEGATIVE Final    Comment: (NOTE) SARS-CoV-2 target nucleic acids are NOT  DETECTED.  The SARS-CoV-2 RNA is generally detectable in upper respiratory specimens during the acute phase of infection. The lowest concentration of SARS-CoV-2 viral copies this assay can detect is 138 copies/mL. A negative result does not preclude SARS-Cov-2 infection and should not be used as the sole basis for treatment or other patient management decisions. A negative result may occur with  improper specimen collection/handling, submission of specimen other than nasopharyngeal swab, presence of viral mutation(s) within the areas targeted by this assay, and inadequate number of viral copies(<138 copies/mL). A negative result must be combined with clinical observations, patient history, and epidemiological information. The expected result is Negative.  Fact Sheet for Patients:  EntrepreneurPulse.com.au  Fact Sheet for Healthcare Providers:  IncredibleEmployment.be  This test is no t yet approved or cleared by the [place] FDA and  has been authorized for detection and/or diagnosis of SARS-CoV-2 by FDA under an Emergency Use Authorization (EUA). This EUA will remain  in effect (meaning this test can be used) for the duration of the COVID-19 declaration under Section 564(b)(1) of the Act, 21 U.S.C.section 360bbb-3(b)(1), unless the authorization is terminated  or revoked sooner.       Influenza A by PCR NEGATIVE NEGATIVE Final   Influenza B by PCR NEGATIVE NEGATIVE Final    Comment: (NOTE) The Xpert Xpress SARS-CoV-2/FLU/RSV plus assay is intended as an aid in the diagnosis of influenza from Nasopharyngeal swab specimens and should not be used as a sole basis for treatment. Nasal washings and aspirates are unacceptable for Xpert Xpress SARS-CoV-2/FLU/RSV testing.  Fact Sheet for Patients: EntrepreneurPulse.com.au  Fact Sheet for Healthcare Providers: IncredibleEmployment.be  This test is not yet  approved or cleared by the [place] FDA and has been authorized for detection and/or diagnosis of SARS-CoV-2 by FDA under an Emergency Use Authorization (EUA). This EUA will remain in effect (meaning this test can be used) for the duration of the COVID-19 declaration under Section 564(b)(1) of the Act, 21 U.S.C. section 360bbb-3(b)(1), unless the authorization is terminated or revoked.     Resp Syncytial Virus by PCR NEGATIVE NEGATIVE Final    Comment: (NOTE) Fact Sheet for Patients: EntrepreneurPulse.com.au  Fact Sheet for Healthcare Providers: IncredibleEmployment.be  This test is not yet approved or cleared by the [place] FDA and has been authorized for detection and/or diagnosis of SARS-CoV-2 by FDA under an  Emergency Use Authorization (EUA). This EUA will remain in effect (meaning this test can be used) for the duration of the COVID-19 declaration under Section 564(b)(1) of the Act, 21 U.S.C. section 360bbb-3(b)(1), unless the authorization is terminated or revoked.  Performed at [facility], [address], [place], [place] [address]      Radiological Exams on Admission: DG Chest 2 View  Result Date: 02/06/2023 CLINICAL DATA:  Shortness of breath and cough for several weeks. Productive cough. EXAM: CHEST - 2 VIEW COMPARISON:  Chest radiographs 11/23/2017, 11/21/2017, 03/06/2015 FINDINGS: Status post median sternotomy. Surgical clips again overlie the anterior left mediastinum and pericardium. Cardiac silhouette again appears mildly enlarged. Mediastinal contours are within normal limits. Redemonstration of mild to moderate elevation of the left hemidiaphragm, chronic. Interval increase in mild left and new mild right pleural effusions. Calcified benign granulomata again overlie the left upper lung. No pneumothorax. Mild multilevel degenerative disc changes of the thoracic spine. Right upper quadrant cholecystectomy clips  are again seen, viewed on the current lateral view only. Partially visualized abdominal aorta vascular stent. IMPRESSION: Interval increase in mild left and new mild right pleural effusions. Associated basilar atelectasis versus pneumonia. Electronically Signed   By: Yvonne Kendall M.D.   On: 02/06/2023 12:58      Assessment/Plan Principal Problem:   Acute on chronic systolic CHF (congestive heart failure) (HCC) Active Problems:   CAD (coronary artery disease)   Myocardial injury   Essential hypertension   Hyperlipidemia   Ischemic stroke (HCC)   Type II diabetes mellitus with renal manifestations (HCC)   Chronic kidney disease, stage 3a (HCC)   Anxiety   Assessment and Plan:  Acute on chronic systolic CHF (congestive heart failure) ([place]): 2D echo on 02/06/19 16 showed EF of 45%.  Patient has leg edema, elevated BNP 1008, positive JVD, clinically consistent with CHF exacerbation.  Patient has 2 L new oxygen requirement.  -Will admit to tele bed as inpatient -Lasix 40 mg bid by IV -2d echo -Daily weights -strict I/O's -Low salt diet -Fluid restriction -Obtain REDs Vest reading -consulted Dr. Rockey Situ of card  CAD (coronary artery disease) and myocardial injury: trop 205. -IV heparin is started by EDP --> will continue -ASA and lipitor 40 mg daily  Essential hypertension -IV hydralazine as needed -Hold HCTZ since patient is on IV Lasix -Cozaar, metoprolol  Hyperlipidemia -Lipitor  Ischemic stroke (HCC) -Aspirin and Lipitor  Type II diabetes mellitus with renal manifestations [facility]): Recent A1c 8.4, poorly controlled.  Blood sugar 321.  Patient is taking Actos, Amaryl -SSI -Start glargine insulin 5 units daily  Chronic kidney disease, stage 3a ([place]): Stable -Follow-up by BMP  Anxiety -As needed     DVT ppx: on IV Heparin      Code Status: DNR per pt and his daughter  Family Communication:   Yes, patient's daughter   at bed side.     Disposition Plan:   Anticipate discharge back to previous environment  Consults called:  Dr. Rockey Situ of card is consulted.  Admission status and Level of care: Telemetry Cardiac:     as inpt       Dispo: The patient is from: Home              Anticipated d/c is to: Home              Anticipated d/c date is: 2 days              Patient currently is not  medically stable to d/c.    Severity of Illness:  The appropriate patient status for this patient is INPATIENT. Inpatient status is judged to be reasonable and necessary in order to provide the required intensity of service to ensure the patient's safety. The patient's presenting symptoms, physical exam findings, and initial radiographic and laboratory data in the context of their chronic comorbidities is felt to place them at high risk for further clinical deterioration. Furthermore, it is not anticipated that the patient will be medically stable for discharge from the hospital within 2 midnights of admission.   * I certify that at the point of admission it is my clinical judgment that the patient will require inpatient hospital care spanning beyond 2 midnights from the point of admission due to high intensity of service, high risk for further deterioration and high frequency of surveillance required.*       Date of Service 02/06/2023    Ivor Costa [facility]   If 7PM-7AM, please contact night-coverage [email or web address] 02/06/2023, 7:04 PM

## 2023-02-06 NOTE — ED Notes (Signed)
Pt resting in stretcher with eyes closed on arrival to room.  Pt appears comfortable.  Even chest rise and fall noted.  Bed rails up at this time.  Stretcher locked and in lowest position.  Call light at bedside.

## 2023-02-07 ENCOUNTER — Encounter: Payer: Self-pay | Admitting: Internal Medicine

## 2023-02-07 ENCOUNTER — Other Ambulatory Visit: Payer: Self-pay

## 2023-02-07 DIAGNOSIS — I209 Angina pectoris, unspecified: Secondary | ICD-10-CM

## 2023-02-07 DIAGNOSIS — I5033 Acute on chronic diastolic (congestive) heart failure: Secondary | ICD-10-CM

## 2023-02-07 DIAGNOSIS — I5023 Acute on chronic systolic (congestive) heart failure: Secondary | ICD-10-CM | POA: Diagnosis not present

## 2023-02-07 DIAGNOSIS — R0602 Shortness of breath: Secondary | ICD-10-CM

## 2023-02-07 DIAGNOSIS — I25118 Atherosclerotic heart disease of native coronary artery with other forms of angina pectoris: Secondary | ICD-10-CM

## 2023-02-07 DIAGNOSIS — E1122 Type 2 diabetes mellitus with diabetic chronic kidney disease: Secondary | ICD-10-CM

## 2023-02-07 LAB — LIPID PANEL
Cholesterol: 126 mg/dL (ref 0–200)
HDL: 77 mg/dL (ref >40)
LDL Cholesterol: 39 mg/dL (ref 0–99)
Total CHOL/HDL Ratio: 1.6 RATIO
Triglycerides: 49 mg/dL (ref <150)
VLDL: 10 mg/dL (ref 0–40)

## 2023-02-07 LAB — MAGNESIUM: Magnesium: 2 mg/dL (ref 1.7–2.4)

## 2023-02-07 LAB — CBG MONITORING, ED
Glucose-Capillary: 101 mg/dL — ABNORMAL HIGH (ref 70–99)
Glucose-Capillary: 161 mg/dL — ABNORMAL HIGH (ref 70–99)

## 2023-02-07 LAB — BASIC METABOLIC PANEL
Anion gap: 8 (ref 5–15)
BUN: 31 mg/dL — ABNORMAL HIGH (ref 8–23)
CO2: 30 mmol/L (ref 22–32)
Calcium: 8.3 mg/dL — ABNORMAL LOW (ref 8.9–10.3)
Chloride: 105 mmol/L (ref 98–111)
Creatinine, Ser: 1.28 mg/dL — ABNORMAL HIGH (ref 0.61–1.24)
GFR, Estimated: 53 mL/min — ABNORMAL LOW (ref >60)
Glucose, Bld: 47 mg/dL — ABNORMAL LOW (ref 70–99)
Potassium: 3.9 mmol/L (ref 3.5–5.1)
Sodium: 143 mmol/L (ref 135–145)

## 2023-02-07 LAB — CBC
HCT: 37.8 % — ABNORMAL LOW (ref 39.0–52.0)
Hemoglobin: 11.7 g/dL — ABNORMAL LOW (ref 13.0–17.0)
MCH: 31 pg (ref 26.0–34.0)
MCHC: 31 g/dL (ref 30.0–36.0)
MCV: 100.3 fL — ABNORMAL HIGH (ref 80.0–100.0)
Platelets: 183 10*3/uL (ref 150–400)
RBC: 3.77 MIL/uL — ABNORMAL LOW (ref 4.22–5.81)
RDW: 13 % (ref 11.5–15.5)
WBC: 7.8 10*3/uL (ref 4.0–10.5)
nRBC: 0 % (ref 0.0–0.2)

## 2023-02-07 LAB — GLUCOSE, CAPILLARY
Glucose-Capillary: 147 mg/dL — ABNORMAL HIGH (ref 70–99)
Glucose-Capillary: 193 mg/dL — ABNORMAL HIGH (ref 70–99)

## 2023-02-07 LAB — HEPARIN LEVEL (UNFRACTIONATED)
Heparin Unfractionated: 0.41 IU/mL (ref 0.30–0.70)
Heparin Unfractionated: 0.33 IU/mL (ref 0.30–0.70)

## 2023-02-07 MED ORDER — INSULIN ASPART 100 UNIT/ML IJ SOLN
0.0000 [IU] | Freq: Three times a day (TID) | INTRAMUSCULAR | Status: DC
  Administered 2023-02-07: 1 [IU] via SUBCUTANEOUS

## 2023-02-07 NOTE — Consult Note (Addendum)
   Heart Failure Nurse Navigator Note  HF echocardiogram performed in 2018 ejection fraction is 50 to 55%.  Cardiogram to be performed on this admission results are currently pending.  He presented from home where he lives with his daughter and son-in-law due to increasing shortness of breath, weight gain and increasing abdominal girth.  BNP was 1005.  Chest x-ray revealed bilateral pleural effusions.  Comorbidities:  Coronary artery disease status post coronary artery bypass grafting Hypertension Hyperlipidemia Diabetes Peripheral vascular disease History of stroke Anxiety Chronic kidney disease stage III Pernicious anemia  Medications:  Aspirin 81 mg daily Atorvastatin 40 mg daily Furosemide 40 mg IV every 12 hours Heparin infusion Losartan 25 mg daily Metoprolol  tartrate 12.5 mg 2 times a day  Labs:  Sodium 143, potassium 3.9, chloride 105, CO2 30, BUN 31, creatinine 1.28, GFR 53, magnesium 2, total cholesterol 126, triglycerides 49, HDL 77, LDL 39.  Weight is 61.2 kg Intake not documented Output 1240 mL.   Initial meeting with patient in the emergency room.  Daughter that he lives with was present at the bedside.  Patient was asleep so spoke directly with the daughter.  Patient is independent he does his own medications set up, daily weights, checking his blood sugars and fixes some of his own meals.  He states that he is active at home, keeping up on the news and the weather.  He also calls the members of his church on their birthdays.  Discussed the portance of daily weights and reporting 2 pound weight gain overnight or total of 5 pounds within the week.  Daughter states that they had noted approximately a 12 pound weight gain prior to coming in.  At that time she had also noted that his abdomen appeared larger and he complained that his pants were fitting tighter.  Also discussed reporting changes in signs and symptoms.  Discussed the importance of sodium and fluid  restriction.  Patient does not use salt at the table.  They try to stay away from processed foods.  Made her aware of follow-up in the outpatient heart failure clinic for which she has an appointment on March 19 at 2:30 in the afternoon.  He is a 6% no-show which is 2 out of 36 appointments.  They were given the living with heart failure teaching booklet, zone magnet, info on heart failure and low-sodium along with weight chart.  She had no further questions.  Pricilla Riffle RN [facility]

## 2023-02-07 NOTE — Discharge Instructions (Signed)

## 2023-02-07 NOTE — Hospital Course (Addendum)
Peter Becker is a 89 y.o. male with medical history significant of sCHF with EF 45%, HTN, HLD, DM, CAD, CABG, PVD, stroke, anxiety, CKD-3a, who presents to ED from home on 02/06/2023 with SOB x1 month not responsive to outpatient tx.  03/07: oxygen saturation 88% on room air which improved to 99% on 2 L oxygen. Trop  205,  BNP 1008, WBC 8.0, stable renal function,  Chest x-ray showed bilateral pleural effusion. Patient is admitted to telemetry bed as inpatient. Dr. Rockey Situ of cardiology was consulted. Started Lasix 40 mg IV bid, continued on IV heparin 03/08: Net IO Since Admission: -1,240 mL [02/07/23 0843]. Cardiology to see. Echo pending.  03/09: Net IO Since Admission: -3,683.42 mL [02/08/23 1858] echo still pending, cardiology saw pt today  03/10: Echo showing EF 35-40%, LV mod decreased fxn, severe hypokinests inf/post wall, akinesis apical area, G2DD, RV fxn mild reduced. Starting low dose carvedilol, work on weaning O2 vs home O2 03/11: no further ischemic eval from cardiology, consider resume losartan tomorrow. Later in afternoon RN noted choking on water, SOB requiring increase O2 again. CXR concern for pulmonary edema, infection possible but seems unlikely based on clinical picture. Will trial another IV lasix and follow repeat CXR in AM.     Consultants:  Cardiology - [facility]   Procedures: none      ASSESSMENT & PLAN:   Principal Problem:   Acute on chronic systolic CHF (congestive heart failure) ([place]) Active Problems:   CAD (coronary artery disease)   Myocardial injury   Essential hypertension   Hyperlipidemia   Ischemic stroke ([place])   Type II diabetes mellitus with renal manifestations (HCC)   Chronic kidney disease, stage 3a (HCC)   Anxiety   Acute on chronic systolic CHF (congestive heart failure)  Acute hypoxic respiratory failure d/t HFrEF New recurrence hypoxia w/ concern for aspiration  2D echo on 02/06/19 16 showed EF of 45%.   03/10: Echo showing EF 35-40%, LV  mod decreased fxn, severe hypokinests inf/post wall, akinesis apical area, G2DD, RV fxn mild reduced.  Lasix 40 mg bid by IV --> po, giving another dose IV today w/ higher O2 requirement but certainly concern for aspiration  2d echo as above  Daily weights, strict I/O's, Low salt diet, Fluid restriction Cardiology following Starting low dose carvedilol work on weaning O2 vs home O2   CAD (coronary artery disease) w/ Hx CABG 2007 Myocardial injury w/ elevated troponin, downtrendign:  trop 205 --> 173, suspect non-ACS demand ischemia  IV heparin completed ASA and lipitor 40 mg daily   Essential hypertension IV hydralazine as needed Hold HCTZ since patient is on Lasix Cozaar d/c but may restart tomorrow per cardiology  Carvedilol started toda   Hyperlipidemia Lipitor   Ischemic stroke ([place]) Aspirin and Lipitor   Type II diabetes mellitus with renal manifestations ([place]) Hyperglycemia Recent A1c 8.4, decently controlled overall.  Blood sugar 321.   hold home Actos, Amaryl Hypoglycemia, stopped insulin    Chronic kidney disease, stage 3a ([place]):  Stable Follow-up BMP especially on Lasix    Anxiety Rx as needed    DVT prophylaxis: on heparin gtt pending cardiology evaluation  Pertinent IV fluids/nutrition: no IV fluids, cardiac/carb diet  Central lines / invasive devices: none  Code Status: DNR  Current Admission Status: inpatient   TOC needs / Dispo plan: anticipate d/c home may need HH/DME, pend PT/OT eval tomorrow Barriers to discharge / significant pending items: clinical improvement, wean O2, continue diuresis

## 2023-02-07 NOTE — ED Notes (Signed)
Pts bed noted to bed wet with urine.  Pt states he missed urinal when attempting to urinate.  Dirty sheets removed and replaced with clean sheets, blankets and hospital gown. Pt denies any other needs at this time.

## 2023-02-07 NOTE — Consult Note (Addendum)
Cardiology Consultation   Patient ID: Peter Becker MRN: [id number]; DOB: [birth year]  Admit date: 02/06/2023 Date of Consult: 02/07/2023  PCP:  Rusty Aus, MD   [place] Providers Cardiologist:  None      New Consult done by Dr Rockey Situ  Patient Profile:   Peter Becker is a 89 y.o. male with a hx of  HFrEF, hypertension, hyperlipidemia, type II diabetes, coronary artery disease s/p CABG x 3 vessels (2007), peripheral vascular disease, CVA, prenicious anemia,anxiety, CKD stage IIIa, who is being seen 02/07/2023 for the evaluation of shortness of breath and elevated high sensitivity troponins at the request of Dr. Blaine Hamper.  History of Present Illness:   Peter Becker is a 89 year old male with the previously mentioned complex past medical history. He was previously followed by [facility] for surveillance of an abdominal aortic aneurysm status post stent graft placement in 03/22/2015. His last appointment was 3 years ago. Per his daughter he has not followed up with a cardiologist is some time.  He presented to the [facility] emergency department on 02/06/23 with a chief complaint of shortness of breath and dyspnea on exertion that has been progressive over the last month with associated congested cough  Per his daughter who is at the bedside, stated that he also had swelling to his lower extremities which his primary care provider just recently started him on diuretic therapy with slight improvement.  He lives with his daughter but is fairly independent with cooking his own meals, managing his own medications, and recent was found to be diabetic and has been checking his home blood sugars.  He denies any chest pain, palpitations, fever, or sick contacts.  Initial vitals: Blood pressure 139/75, pulse of 90, respirations of 19, temperature of 98.7, respirations of 88% on room air and he was placed on 2 L of O2 via nasal cannula  Pertinent labs: Blood glucose 321, BUN of 34, serum creatinine of 1.44,  calcium 8.7, EGFR 46, hemoglobin 12.9, BNP 1008.1, high-sensitivity troponin 205 and 173, resp panel negative  Imaging: Chest x-ray revealed interval increase in mild left and new mild right pleural effusions, associated bibasilar atelectasis versus pneumonia  Medications administered in the emergency department: Heparin bolus and heparin infusion, furosemide 20 mg IVP aspirin 324 mg  Cardiology was consulted this morning due to elevated high-sensitivity troponins and elevated BNP.   Past Medical History:  Diagnosis Date   Diabetes mellitus without complication ([place])    Hypertension    Myocardial infarct [facility])     Past Surgical History:  Procedure Laterality Date   CARPAL TUNNEL RELEASE Left    CHOLECYSTECTOMY     EYE SURGERY     heart stent     triple bypass   HEMORRHOID SURGERY     TONSILLECTOMY       Home Medications:  Prior to Admission medications   Medication Sig Start Date End Date Taking? Authorizing Provider  acetaminophen (TYLENOL) 650 MG suppository Place 1 suppository (650 mg total) rectally every 6 (six) hours as needed for fever. 11/27/17  Yes Sheikh, Omair Latif, DO  cyanocobalamin (,VITAMIN B-12,) 1000 MCG/ML injection INJ 1 ML IM Q 14 DAYS 12/29/17  Yes [provider]  glimepiride (AMARYL) 4 MG tablet Take 4 mg by mouth daily with breakfast.   Yes [provider]  hydrochlorothiazide (HYDRODIURIL) 25 MG tablet Take 25 mg by mouth daily.   Yes [provider]  losartan (COZAAR) 25 MG tablet Take 25 mg by mouth  daily.   Yes [provider]  metoprolol tartrate (LOPRESSOR) 25 MG tablet Take 12.5 mg by mouth 2 (two) times daily. 01/19/18  Yes [provider]  pioglitazone (ACTOS) 15 MG tablet Take 15 mg by mouth daily.   Yes [provider]  tamsulosin (FLOMAX) 0.4 MG CAPS capsule Take 0.4 mg by mouth daily.   Yes [provider]  chlorhexidine (PERIDEX) 0.12 % solution 15 mLs by Mouth Rinse route 2 (two)  times daily. Patient not taking: Reported on 02/06/2023 11/27/17   Raiford Noble Latif, DO  glyBURIDE-metformin [facility]) 2.5-500 MG tablet  01/19/18   [provider]  glycopyrrolate (ROBINUL) 0.2 MG/ML injection Inject 2 mLs (0.4 mg total) into the vein 3 (three) times daily. Patient not taking: Reported on 02/06/2023 11/27/17   Raiford Noble Latif, DO  ipratropium-albuterol (DUONEB) 0.5-2.5 (3) MG/3ML SOLN Take 3 mLs by nebulization every 4 (four) hours as needed. Patient not taking: Reported on 02/06/2023 11/27/17   Raiford Noble Latif, DO  lisinopril (PRINIVIL,ZESTRIL) 10 MG tablet Take by mouth. Patient not taking: Reported on 02/06/2023    [provider]  LORazepam (ATIVAN) 2 MG/ML injection Inject 0.5 mLs (1 mg total) into the vein every 4 (four) hours as needed for anxiety. Patient not taking: Reported on 02/06/2023 11/27/17   Raiford Noble Latif, DO  losartan (COZAAR) 50 MG tablet  12/29/17   [provider]  morphine 2 MG/ML injection Inject 1 mL (2 mg total) into the vein every 2 (two) hours as needed (dyspnea). Patient not taking: Reported on 01/26/2018 11/27/17   Raiford Noble Latif, DO  sodium chloride 0.9 % SOLN 100 mL with valproate 500 MG/5ML SOLN 500 mg Inject 500 mg into the vein every 8 (eight) hours. Patient not taking: Reported on 01/26/2018 11/27/17   Kerney Elbe, DO    Inpatient Medications: Scheduled Meds:  aspirin EC  81 mg Oral Daily   atorvastatin  40 mg Oral Daily   furosemide  20 mg Intravenous Once   furosemide  40 mg Intravenous Q12H   insulin aspart  0-6 Units Subcutaneous TID WC   insulin glargine-yfgn  5 Units Subcutaneous Daily   losartan  25 mg Oral Daily   metoprolol tartrate  12.5 mg Oral BID   tamsulosin  0.4 mg Oral Daily   Continuous Infusions:  heparin 700 Units/hr (02/07/23 0722)   PRN Meds: acetaminophen, albuterol, dextromethorphan-guaiFENesin, diphenhydrAMINE, hydrALAZINE, LORazepam, nitroGLYCERIN  Allergies:     Allergies  Allergen Reactions   Tape Other (See Comments)    SKIN IS VERY THIN AND TEARS AND BRUISES EASILY; Please use an alternative!!    Social History:   Social History   Socioeconomic History   Marital status: Widowed    Spouse name: Not on file   Number of children: Not on file   Years of education: Not on file   Highest education level: Not on file  Occupational History   Not on file  Tobacco Use   Smoking status: Former   Smokeless tobacco: Never  Vaping Use   Vaping Use: Never used  Substance and Sexual Activity   Alcohol use: No   Drug use: No   Sexual activity: Not on file  Other Topics Concern   Not on file  Social History Narrative   Not on file   Social Determinants of Health   Financial Resource Strain: Not on file  Food Insecurity: No Food Insecurity (02/07/2023)   Hunger Vital Sign    Worried  About Running Out of Food in the Last Year: Never true    Ran Out of Food in the Last Year: Never true  Transportation Needs: No Transportation Needs (02/07/2023)   PRAPARE - Hydrologist (Medical): No    Lack of Transportation (Non-Medical): No  Physical Activity: Not on file  Stress: Not on file  Social Connections: Not on file  Intimate Partner Violence: Not At Risk (02/07/2023)   Humiliation, Afraid, Rape, and Kick questionnaire    Fear of Current or Ex-Partner: No    Emotionally Abused: No    Physically Abused: No    Sexually Abused: No    Family History:    Family History  Problem Relation Age of Onset   Heart attack Father      ROS:  Please see the history of present illness.  Review of Systems  Constitutional:  Positive for malaise/fatigue.  Respiratory:  Positive for shortness of breath.   Cardiovascular:  Positive for leg swelling.  Genitourinary:  Positive for frequency.  Neurological:  Positive for weakness.    All other ROS reviewed and negative.     Physical Exam/Data:   Vitals:   02/07/23 0800  02/07/23 0805 02/07/23 1000 02/07/23 1200  BP:  114/78  129/74  Pulse:  78 82 80  Resp:  (!) 22 (!) 27 (!) 21  Temp:      TempSrc:      SpO2: (!) 78% 90% 95% 93%  Weight:        Intake/Output Summary (Last 24 hours) at 02/07/2023 1354 Last data filed at 02/07/2023 0800 Gross per 24 hour  Intake --  Output 1740 ml  Net -1740 ml      02/06/2023   12:15 PM 01/26/2018    9:40 AM 11/26/2017    5:00 AM  Last 3 Weights  Weight (lbs) 135 lb 125 lb 9.6 oz 137 lb 5.6 oz  Weight (kg) 61.236 kg 56.972 kg 62.3 kg     Body mass index is 21.14 kg/m.  General:  Well developed , in no acute distress HEENT: normal Neck: no JVD Vascular: No carotid bruits; Distal pulses 2+ bilaterally Cardiac:  normal S1, S2; RRR; no murmur  Lungs:  Rales in the bases to auscultation bilaterally, respirations remain unlabored at rest on 3L O2 via [place], congested non-productive cough Abd: soft, nontender, no hepatomegaly  Ext: trace edema Musculoskeletal:  No deformities, BUE and BLE strength normal and equal Skin: warm and dry  Neuro:  CNs 2-12 intact, no focal abnormalities noted Psych:  Normal affect   EKG:  The EKG was personally reviewed and demonstrates:  sinus rate of 93, LVH, right bundle branch block, left anterior fascicular block Telemetry:  Telemetry was personally reviewed and demonstrates:  sinus with occasional unifocal PVC's rate in the 90's  Relevant CV Studies: TTE 11/20/2017 Left ventricle: The cavity size was normal. Systolic function was    normal. The estimated ejection fraction was in the range of 50%    to 55%. Hypokinesis of the apical myocardium. Doppler parameters    are consistent with abnormal left ventricular relaxation (grade 1    diastolic dysfunction). Doppler parameters are consistent with    high ventricular filling pressure.  - Aortic valve: Transvalvular velocity was within the normal range.    There was no stenosis. There was no regurgitation. Valve area    (VTI): 2.8  cm^2. Valve area (Vmax): 2.67 cm^2. Valve area    (Vmean): 2.38 cm^2.  -  Mitral valve: Transvalvular velocity was within the normal range.    There was no evidence for stenosis. There was mild regurgitation.  - Left atrium: The atrium was moderately dilated.  - Right ventricle: The cavity size was normal. Wall thickness was    normal. Systolic function was normal.  - Right atrium: The atrium was mildly dilated.  - Atrial septum: No defect or patent foramen ovale was identified.  - Tricuspid valve: There was no regurgitation   Laboratory Data:  High Sensitivity Troponin:   Recent Labs  Lab 02/06/23 1221 02/06/23 1610  TROPONINIHS 205* 173*     Chemistry Recent Labs  Lab 02/06/23 1221 02/07/23 0440  NA 142 143  K 4.4 3.9  CL 107 105  CO2 27 30  GLUCOSE 321* 47*  BUN 34* 31*  CREATININE 1.44* 1.28*  CALCIUM 8.7* 8.3*  MG  --  2.0  GFRNONAA 46* 53*  ANIONGAP 8 8    No results for input(s): "PROT", "ALBUMIN", "AST", "ALT", "ALKPHOS", "BILITOT" in the last 168 hours. Lipids  Recent Labs  Lab 02/07/23 0440  CHOL 126  TRIG 49  HDL 77  LDLCALC 39  CHOLHDL 1.6    Hematology Recent Labs  Lab 02/06/23 1221 02/07/23 0440  WBC 8.0 7.8  RBC 4.05* 3.77*  HGB 12.9* 11.7*  HCT 41.0 37.8*  MCV 101.2* 100.3*  MCH 31.9 31.0  MCHC 31.5 31.0  RDW 13.1 13.0  PLT 205 183   Thyroid No results for input(s): "TSH", "FREET4" in the last 168 hours.  BNP Recent Labs  Lab 02/06/23 1221  BNP 1,008.1*    DDimer No results for input(s): "DDIMER" in the last 168 hours.   Radiology/Studies:  DG Chest 2 View  Result Date: 02/06/2023 CLINICAL DATA:  Shortness of breath and cough for several weeks. Productive cough. EXAM: CHEST - 2 VIEW COMPARISON:  Chest radiographs 11/23/2017, 11/21/2017, 03/06/2015 FINDINGS: Status post median sternotomy. Surgical clips again overlie the anterior left mediastinum and pericardium. Cardiac silhouette again appears mildly enlarged. Mediastinal  contours are within normal limits. Redemonstration of mild to moderate elevation of the left hemidiaphragm, chronic. Interval increase in mild left and new mild right pleural effusions. Calcified benign granulomata again overlie the left upper lung. No pneumothorax. Mild multilevel degenerative disc changes of the thoracic spine. Right upper quadrant cholecystectomy clips are again seen, viewed on the current lateral view only. Partially visualized abdominal aorta vascular stent. IMPRESSION: Interval increase in mild left and new mild right pleural effusions. Associated basilar atelectasis versus pneumonia. Electronically Signed   By: Yvonne Kendall M.D.   On: 02/06/2023 12:58     Assessment and Plan:   Elevated high-sensitivity troponin with a history of coronary artery disease status post CABG -Patient remains chest pain-free -Likely supply demand mismatch in the setting of elevated BNP, dehydration, possible pneumonia -No ischemic changes noted on EKG or telemetry monitoring -High-sensitivity troponins trended flat -Patient was started on IV heparin on arrival to the emergency department, continue for 48 hours then discontinue -Echocardiogram ordered and pending -Continued on aspirin and statin -Continue telemetry monitoring -EKG for pain or changes as needed -No current plans for invasive testing at this time  Acute on chronic HFimpEF -LVEF in 2018 was 50-55% -Patient arrived with progressive complaints of shortness of breath and dyspnea on exertion -Chest x-ray reveals associated bilateral atelectasis versus pneumonia and mild left and right pleural effusion -Maintaining oxygen saturations on 3 to 4 L nasal cannula -BNP 1008.1 -Continued on losartan, metoprolol,  and furosemide 40 mg IV twice daily -Heart failure education -Daily weights, I's and O's, low-sodium diet -Echocardiogram ordered and pending with results to follow  Essential hypertension -Blood pressure 114/78 -Continued  on current medication regimen -Blood pressure remained stable -Vital signs per unit protocol  Hyperlipidemia -LDL 39 -Continued on atorvastatin 40 mg daily  History of ischemic stroke -Continued on aspirin and statin  CKD stage IIIa -Serum creatinine 1.28, slightly improved from arrival of 1.44 -Baseline serum creatinine around 1.3 -Monitor urine output -Daily BMP -Monitor/trend/replete electrolytes as needed -Avoid nephrotoxic agents were able  7.  Type 2 diabetes -Continued on insulin therapy -Management per IM   Risk Assessment/Risk Scores:     TIMI Risk Score for Unstable Angina or Non-ST Elevation MI:   The patient's TIMI risk score is 5, which indicates a 26% risk of all cause mortality, new or recurrent myocardial infarction or need for urgent revascularization in the next 14 days.  New York Heart Association (NYHA) Functional Class NYHA Class II        For questions or updates, please contact [place] Please consult [email or web address] for contact info under    Signed, Holston Oyama, NP  02/07/2023 1:54 PM

## 2023-02-07 NOTE — Progress Notes (Addendum)
Pt asked for urinal as he started urinating in bed. Provided it.  Pt has good appetite, eating breakfast.  Pt has very productive cough.

## 2023-02-07 NOTE — Progress Notes (Signed)
[place] for heparin drip Indication: ACS/NSTEMI  Allergies  Allergen Reactions   Tape Other (See Comments)    SKIN IS VERY THIN AND TEARS AND BRUISES EASILY; Please use an alternative!!    Patient Measurements: Weight: 61.2 kg (135 lb) Heparin Dosing Weight: 61.2  Vital Signs: Temp: 97.8 F (36.6 C) (03/08 0724) Temp Source: Oral (03/08 0724) BP: 129/74 (03/08 1200) Pulse Rate: 80 (03/08 1200)  Labs: Recent Labs    02/06/23 1221 02/06/23 1610 02/07/23 0147 02/07/23 0440 02/07/23 1217  HGB 12.9*  --   --  11.7*  --   HCT 41.0  --   --  37.8*  --   PLT 205  --   --  183  --   APTT  --  27  --   --   --   LABPROT  --  14.3  --   --   --   INR  --  1.1  --   --   --   HEPARINUNFRC  --   --  0.41  --  0.33  CREATININE 1.44*  --   --  1.28*  --   TROPONINIHS 205* 173*  --   --   --      CrCl cannot be calculated (Unknown ideal weight.).   Medical History: Past Medical History:  Diagnosis Date   Diabetes mellitus without complication ([place])    Hypertension    Myocardial infarct ([place])     Medications:  No home anticoagulation per pharmacist review  Assessment: 89 yo male presenting to ED for complaint of shortness of breath and weakness.  PMH includes CHF, DM, CVA, and ASCVD.  Found to have elevated troponin.  Pharmacy consulted to initiate heparin drip.  Baseline labs: hgb 12.9, hct 41, plt 205, PT/INR pending, aPTT pending  Goal of Therapy:  Heparin level 0.3-0.7 units/ml Monitor platelets by anticoagulation protocol: Yes   03/08 0147 HL 0.41, therapeutic x 1 03/08 1217 HL 0.33, therapeutic x 2  Plan: HL therapeutic x 2 Continue heparin infusion at 700 units/hr Recheck HL with AM labs on 3/9 CBC daily while on heparin  Tollie Eth III, PharmD 02/07/2023 1:01 PM

## 2023-02-07 NOTE — Progress Notes (Signed)
[place] for heparin drip Indication: ACS/NSTEMI  Allergies  Allergen Reactions   Tape Other (See Comments)    SKIN IS VERY THIN AND TEARS AND BRUISES EASILY; Please use an alternative!!    Patient Measurements: Weight: 61.2 kg (135 lb) Heparin Dosing Weight: 61.2  Vital Signs: Temp: 97.6 F (36.4 C) (03/08 0141) Temp Source: Oral (03/08 0141) BP: 102/67 (03/08 0141) Pulse Rate: 75 (03/08 0141)  Labs: Recent Labs    02/06/23 1221 02/06/23 1610 02/07/23 0147  HGB 12.9*  --   --   HCT 41.0  --   --   PLT 205  --   --   APTT  --  27  --   LABPROT  --  14.3  --   INR  --  1.1  --   HEPARINUNFRC  --   --  0.41  CREATININE 1.44*  --   --   TROPONINIHS 205* 173*  --      CrCl cannot be calculated (Unknown ideal weight.).   Medical History: Past Medical History:  Diagnosis Date   Diabetes mellitus without complication ([place])    Hypertension    Myocardial infarct ([place])     Medications:  No home anticoagulation per pharmacist review  Assessment: 89 yo male presenting to ED for complaint of shortness of breath and weakness.  PMH includes CHF, DM, CVA, and ASCVD.  Found to have elevated troponin.  Pharmacy consulted to initiate heparin drip.  Baseline labs: hgb 12.9, hct 41, plt 205, PT/INR pending, aPTT pending  Goal of Therapy:  Heparin level 0.3-0.7 units/ml Monitor platelets by anticoagulation protocol: Yes   03/08 0147 HL 0.41, therapeutic x 1  Plan:  Continue heparin infusion at 700 units/hr Recheck HL in 8 hr to confirm CBC daily while on heparin  Renda Rolls, PharmD, [facility] 02/07/2023 2:13 AM

## 2023-02-07 NOTE — Progress Notes (Signed)
PROGRESS NOTE    CLAIBORNE BINGAMAN   [id number] DOB: March 30, 1929  DOA: 02/06/2023 Date of Service: 02/07/23 PCP: Rusty Aus, MD     Brief Narrative / Hospital Course:  KAP MILTIMORE is a 89 y.o. male with medical history significant of sCHF with EF 45%, HTN, HLD, DM, CAD, CABG, PVD, stroke, anxiety, CKD-3a, who presents to ED from home on 02/06/2023 with SOB x1 month not responsive to outpatient tx.  03/07: oxygen saturation 88% on room air which improved to 99% on 2 L oxygen. Trop  205,  BNP 1008, WBC 8.0, stable renal function,  Chest x-ray showed bilateral pleural effusion. Patient is admitted to telemetry bed as inpatient. Dr. Rockey Situ of cardiology was consulted. Started Lasix 40 mg IV bid, continued on IV heparin 03/08: Net IO Since Admission: -1,240 mL [02/07/23 0843]. Cardiology to see. Echo pending.     Consultants:  Cardiology - [facility]   Procedures: none      ASSESSMENT & PLAN:   Principal Problem:   Acute on chronic systolic CHF (congestive heart failure) (HCC) Active Problems:   CAD (coronary artery disease)   Myocardial injury   Essential hypertension   Hyperlipidemia   Ischemic stroke ([place])   Type II diabetes mellitus with renal manifestations (HCC)   Chronic kidney disease, stage 3a (HCC)   Anxiety   Acute on chronic systolic CHF (congestive heart failure)  Acute hypoxic respiratory failure d/t HFrEF 2D echo on 02/06/19 16 showed EF of 45%.   Lasix 40 mg bid by IV 2d echo Daily weights strict I/O's Low salt diet Fluid restriction Obtain REDs Vest reading consulted Dr. Rockey Situ of cardiology    CAD (coronary artery disease) and myocardial injury:  trop 205 --> 173, suspect non-ACS demand ischemia  IV heparin is started by EDP --> will continue pending cardiology input  ASA and lipitor 40 mg daily   Essential hypertension IV hydralazine as needed Hold HCTZ since patient is on IV Lasix Cozaar, metoprolol  Hyperlipidemia Lipitor   Ischemic  stroke (HCC) Aspirin and Lipitor   Type II diabetes mellitus with renal manifestations ([place]) Hyperglycemia Recent A1c 8.4, decently controlled overall.  Blood sugar 321.   hold home Actos, Amaryl SSI Start glargine insulin 5 units daily   Chronic kidney disease, stage 3a ([place]): Stable Follow-up by [facility]   Anxiety Rx as needed    DVT prophylaxis: on heparin gtt pending cardiology evaluation  Pertinent IV fluids/nutrition: no IV fluids, cardiac/carb diet  Central lines / invasive devices: none  Code Status: DNR  Current Admission Status: inpatient   TOC needs / Dispo plan: anticipate d/c home may need HH/DME, pend PT/OT eval Barriers to discharge / significant pending items: clinical improvement, wean O2, continue diuresis              Subjective / Brief ROS:  Patient reports feeling better Denies CP/SOB. He is on 5L O2  Pain controlled.  Denies new weakness.  Tolerating diet.  Reports no concerns w/ urination/defecation.   Family Communication: daughter at bedside on rounds     Objective Findings:  Vitals:   02/07/23 0800 02/07/23 0805 02/07/23 1000 02/07/23 1200  BP:  114/78  129/74  Pulse:  78 82 80  Resp:  (!) 22 (!) 27 (!) 21  Temp:      TempSrc:      SpO2: (!) 78% 90% 95% 93%  Weight:        Intake/Output Summary (Last 24 hours) at 02/07/2023 1246  Last data filed at 02/07/2023 0800 Gross per 24 hour  Intake --  Output 1740 ml  Net -1740 ml   Filed Weights   02/06/23 1215  Weight: 61.2 kg    Examination:  Physical Exam Constitutional:      General: He is not in acute distress.    Appearance: He is well-developed.  Cardiovascular:     Rate and Rhythm: Normal rate and regular rhythm.  Pulmonary:     Effort: No tachypnea.     Breath sounds: Examination of the right-lower field reveals rales. Examination of the left-lower field reveals rales. Rales present.  Musculoskeletal:     Right lower leg: No edema.     Left lower leg: No edema.   Skin:    General: Skin is warm and dry.  Neurological:     General: No focal deficit present.     Mental Status: He is alert and oriented to person, place, and time.  Psychiatric:        Mood and Affect: Mood normal.        Behavior: Behavior normal.          Scheduled Medications:   aspirin EC  81 mg Oral Daily   atorvastatin  40 mg Oral Daily   furosemide  20 mg Intravenous Once   furosemide  40 mg Intravenous Q12H   insulin aspart  0-6 Units Subcutaneous TID WC   insulin glargine-yfgn  5 Units Subcutaneous Daily   losartan  25 mg Oral Daily   metoprolol tartrate  12.5 mg Oral BID   tamsulosin  0.4 mg Oral Daily    Continuous Infusions:  heparin 700 Units/hr (02/07/23 0722)    PRN Medications:  acetaminophen, albuterol, dextromethorphan-guaiFENesin, diphenhydrAMINE, hydrALAZINE, LORazepam, nitroGLYCERIN  Antimicrobials from admission:  Anti-infectives (From admission, onward)    None           Data Reviewed:  I have personally reviewed the following...  CBC: Recent Labs  Lab 02/06/23 1221 02/07/23 0440  WBC 8.0 7.8  HGB 12.9* 11.7*  HCT 41.0 37.8*  MCV 101.2* 100.3*  PLT 205 [id number]   Basic Metabolic Panel: Recent Labs  Lab 02/06/23 1221 02/07/23 0440  NA 142 143  K 4.4 3.9  CL 107 105  CO2 27 30  GLUCOSE 321* 47*  BUN 34* 31*  CREATININE 1.44* 1.28*  CALCIUM 8.7* 8.3*  MG  --  2.0   GFR: CrCl cannot be calculated (Unknown ideal weight.). Liver Function Tests: No results for input(s): "AST", "ALT", "ALKPHOS", "BILITOT", "PROT", "ALBUMIN" in the last 168 hours. No results for input(s): "LIPASE", "AMYLASE" in the last 168 hours. No results for input(s): "AMMONIA" in the last 168 hours. Coagulation Profile: Recent Labs  Lab 02/06/23 1610  INR 1.1   Cardiac Enzymes: No results for input(s): "CKTOTAL", "CKMB", "CKMBINDEX", "TROPONINI" in the last 168 hours. BNP (last 3 results) No results for input(s): "PROBNP" in the last 8760  hours. HbA1C: No results for input(s): "HGBA1C" in the last 72 hours. CBG: Recent Labs  Lab 02/06/23 1716 02/06/23 2245 02/07/23 0947 02/07/23 1206  GLUCAP 270* 112* 101* 161*   Lipid Profile: Recent Labs    02/07/23 0440  CHOL 126  HDL 77  LDLCALC 39  TRIG 49  CHOLHDL 1.6   Thyroid Function Tests: No results for input(s): "TSH", "T4TOTAL", "FREET4", "T3FREE", "THYROIDAB" in the last 72 hours. Anemia Panel: No results for input(s): "VITAMINB12", "FOLATE", "FERRITIN", "TIBC", "IRON", "RETICCTPCT" in the last 72 hours. Most Recent Urinalysis  On File:     Component Value Date/Time   COLORURINE YELLOW 11/23/2017 1435   APPEARANCEUR CLEAR 11/23/2017 1435   LABSPEC 1.025 11/23/2017 1435   PHURINE 5.0 11/23/2017 1435   GLUCOSEU 50 (A) 11/23/2017 1435   HGBUR NEGATIVE 11/23/2017 1435   BILIRUBINUR NEGATIVE 11/23/2017 1435   KETONESUR 5 (A) 11/23/2017 1435   PROTEINUR 100 (A) 11/23/2017 1435   NITRITE NEGATIVE 11/23/2017 1435   LEUKOCYTESUR NEGATIVE 11/23/2017 1435   Sepsis Labs: '@LABRCNTIP'$ (procalcitonin:4,lacticidven:4) Microbiology: Recent Results (from the past 240 hour(s))  Resp panel by RT-PCR (RSV, Flu A&B, Covid) Anterior Nasal Swab     Status: None   Collection Time: 02/06/23 12:21 PM   Specimen: Anterior Nasal Swab  Result Value Ref Range Status   SARS Coronavirus 2 by RT PCR NEGATIVE NEGATIVE Final    Comment: (NOTE) SARS-CoV-2 target nucleic acids are NOT DETECTED.  The SARS-CoV-2 RNA is generally detectable in upper respiratory specimens during the acute phase of infection. The lowest concentration of SARS-CoV-2 viral copies this assay can detect is 138 copies/mL. A negative result does not preclude SARS-Cov-2 infection and should not be used as the sole basis for treatment or other patient management decisions. A negative result may occur with  improper specimen collection/handling, submission of specimen other than nasopharyngeal swab, presence of  viral mutation(s) within the areas targeted by this assay, and inadequate number of viral copies(<138 copies/mL). A negative result must be combined with clinical observations, patient history, and epidemiological information. The expected result is Negative.  Fact Sheet for Patients:  EntrepreneurPulse.com.au  Fact Sheet for Healthcare Providers:  IncredibleEmployment.be  This test is no t yet approved or cleared by the [place] FDA and  has been authorized for detection and/or diagnosis of SARS-CoV-2 by FDA under an Emergency Use Authorization (EUA). This EUA will remain  in effect (meaning this test can be used) for the duration of the COVID-19 declaration under Section 564(b)(1) of the Act, 21 U.S.C.section 360bbb-3(b)(1), unless the authorization is terminated  or revoked sooner.       Influenza A by PCR NEGATIVE NEGATIVE Final   Influenza B by PCR NEGATIVE NEGATIVE Final    Comment: (NOTE) The Xpert Xpress SARS-CoV-2/FLU/RSV plus assay is intended as an aid in the diagnosis of influenza from Nasopharyngeal swab specimens and should not be used as a sole basis for treatment. Nasal washings and aspirates are unacceptable for Xpert Xpress SARS-CoV-2/FLU/RSV testing.  Fact Sheet for Patients: EntrepreneurPulse.com.au  Fact Sheet for Healthcare Providers: IncredibleEmployment.be  This test is not yet approved or cleared by the [place] FDA and has been authorized for detection and/or diagnosis of SARS-CoV-2 by FDA under an Emergency Use Authorization (EUA). This EUA will remain in effect (meaning this test can be used) for the duration of the COVID-19 declaration under Section 564(b)(1) of the Act, 21 U.S.C. section 360bbb-3(b)(1), unless the authorization is terminated or revoked.     Resp Syncytial Virus by PCR NEGATIVE NEGATIVE Final    Comment: (NOTE) Fact Sheet for  Patients: EntrepreneurPulse.com.au  Fact Sheet for Healthcare Providers: IncredibleEmployment.be  This test is not yet approved or cleared by the [place] FDA and has been authorized for detection and/or diagnosis of SARS-CoV-2 by FDA under an Emergency Use Authorization (EUA). This EUA will remain in effect (meaning this test can be used) for the duration of the COVID-19 declaration under Section 564(b)(1) of the Act, 21 U.S.C. section 360bbb-3(b)(1), unless the authorization is terminated or revoked.  Performed at [facility], [address], [place], [place] [address]       Radiology Studies last 3 days: DG Chest 2 View  Result Date: 02/06/2023 CLINICAL DATA:  Shortness of breath and cough for several weeks. Productive cough. EXAM: CHEST - 2 VIEW COMPARISON:  Chest radiographs 11/23/2017, 11/21/2017, 03/06/2015 FINDINGS: Status post median sternotomy. Surgical clips again overlie the anterior left mediastinum and pericardium. Cardiac silhouette again appears mildly enlarged. Mediastinal contours are within normal limits. Redemonstration of mild to moderate elevation of the left hemidiaphragm, chronic. Interval increase in mild left and new mild right pleural effusions. Calcified benign granulomata again overlie the left upper lung. No pneumothorax. Mild multilevel degenerative disc changes of the thoracic spine. Right upper quadrant cholecystectomy clips are again seen, viewed on the current lateral view only. Partially visualized abdominal aorta vascular stent. IMPRESSION: Interval increase in mild left and new mild right pleural effusions. Associated basilar atelectasis versus pneumonia. Electronically Signed   By: Yvonne Kendall M.D.   On: 02/06/2023 12:58             LOS: 1 day     Emeterio Reeve, DO [facility] 02/07/2023, 12:46 PM    Dictation software may have been used to generate the above note. Typos may  occur and escape review in typed/dictated notes. Please contact Dr Sheppard Coil directly for clarity if needed.  Staff may message me via secure chat in [place]  but this may not receive an immediate response,  please page me for urgent matters!  If 7PM-7AM, please contact night coverage [email or web address]

## 2023-02-08 DIAGNOSIS — I5023 Acute on chronic systolic (congestive) heart failure: Secondary | ICD-10-CM | POA: Diagnosis not present

## 2023-02-08 LAB — BASIC METABOLIC PANEL
Anion gap: 10 (ref 5–15)
BUN: 31 mg/dL — ABNORMAL HIGH (ref 8–23)
CO2: 29 mmol/L (ref 22–32)
Calcium: 8.1 mg/dL — ABNORMAL LOW (ref 8.9–10.3)
Chloride: 101 mmol/L (ref 98–111)
Creatinine, Ser: 1.3 mg/dL — ABNORMAL HIGH (ref 0.61–1.24)
GFR, Estimated: 52 mL/min — ABNORMAL LOW (ref >60)
Glucose, Bld: 81 mg/dL (ref 70–99)
Potassium: 4.1 mmol/L (ref 3.5–5.1)
Sodium: 140 mmol/L (ref 135–145)

## 2023-02-08 LAB — CBC
HCT: 36.3 % — ABNORMAL LOW (ref 39.0–52.0)
Hemoglobin: 11.5 g/dL — ABNORMAL LOW (ref 13.0–17.0)
MCH: 31.5 pg (ref 26.0–34.0)
MCHC: 31.7 g/dL (ref 30.0–36.0)
MCV: 99.5 fL (ref 80.0–100.0)
Platelets: 182 10*3/uL (ref 150–400)
RBC: 3.65 MIL/uL — ABNORMAL LOW (ref 4.22–5.81)
RDW: 12.8 % (ref 11.5–15.5)
WBC: 9.4 10*3/uL (ref 4.0–10.5)
nRBC: 0 % (ref 0.0–0.2)

## 2023-02-08 LAB — HEMOGLOBIN A1C
Hgb A1c MFr Bld: 11.3 % — ABNORMAL HIGH (ref 4.8–5.6)
Mean Plasma Glucose: 278 mg/dL

## 2023-02-08 LAB — GLUCOSE, CAPILLARY
Glucose-Capillary: 47 mg/dL — ABNORMAL LOW (ref 70–99)
Glucose-Capillary: 74 mg/dL (ref 70–99)
Glucose-Capillary: 88 mg/dL (ref 70–99)
Glucose-Capillary: 132 mg/dL — ABNORMAL HIGH (ref 70–99)
Glucose-Capillary: 149 mg/dL — ABNORMAL HIGH (ref 70–99)

## 2023-02-08 LAB — HEPARIN LEVEL (UNFRACTIONATED): Heparin Unfractionated: 0.28 IU/mL — ABNORMAL LOW (ref 0.30–0.70)

## 2023-02-08 MED ORDER — HEPARIN BOLUS VIA INFUSION
900.0000 [IU] | Freq: Once | INTRAVENOUS | Status: AC
  Administered 2023-02-08: 900 [IU] via INTRAVENOUS
  Filled 2023-02-08: qty 900

## 2023-02-08 MED ORDER — FUROSEMIDE 10 MG/ML IJ SOLN
20.0000 mg | Freq: Two times a day (BID) | INTRAMUSCULAR | Status: DC
  Administered 2023-02-08 – 2023-02-09 (×2): 20 mg via INTRAVENOUS
  Filled 2023-02-08 (×2): qty 2

## 2023-02-08 MED ORDER — LOSARTAN POTASSIUM 25 MG PO TABS: 12.5000 mg | ORAL_TABLET | Freq: Every day | ORAL | Status: DC

## 2023-02-08 NOTE — Progress Notes (Signed)
[place] for heparin drip Indication: ACS/NSTEMI  Allergies  Allergen Reactions   Tape Other (See Comments)    SKIN IS VERY THIN AND TEARS AND BRUISES EASILY; Please use an alternative!!    Patient Measurements: Height: 5' 7.01" (170.2 cm) Weight: 61 kg (134 lb 7.7 oz) IBW/kg (Calculated) : 66.12 Heparin Dosing Weight: 61.2  Vital Signs: Temp: 97.6 F (36.4 C) (03/09 0435) BP: 94/58 (03/09 0435) Pulse Rate: 69 (03/09 0435)  Labs: Recent Labs    02/06/23 1221 02/06/23 1610 02/07/23 0147 02/07/23 0440 02/07/23 1217 02/08/23 0352  HGB 12.9*  --   --  11.7*  --  11.5*  HCT 41.0  --   --  37.8*  --  36.3*  PLT 205  --   --  183  --  182  APTT  --  27  --   --   --   --   LABPROT  --  14.3  --   --   --   --   INR  --  1.1  --   --   --   --   HEPARINUNFRC  --   --  0.41  --  0.33 0.28*  CREATININE 1.44*  --   --  1.28*  --  1.30*  TROPONINIHS 205* 173*  --   --   --   --      Estimated Creatinine Clearance: 31.3 mL/min (A) (by C-G formula based on SCr of 1.3 mg/dL (H)).   Medical History: Past Medical History:  Diagnosis Date   Diabetes mellitus without complication ([place])    Hypertension    Myocardial infarct ([place])     Medications:  No home anticoagulation per pharmacist review  Assessment: 89 yo male presenting to ED for complaint of shortness of breath and weakness.  PMH includes CHF, DM, CVA, and ASCVD.  Found to have elevated troponin.  Pharmacy consulted to initiate heparin drip.  Baseline labs: hgb 12.9, hct 41, plt 205, PT/INR pending, aPTT pending  Goal of Therapy:  Heparin level 0.3-0.7 units/ml Monitor platelets by anticoagulation protocol: Yes   03/08 0147 HL 0.41, therapeutic x 1 03/08 1217 HL 0.33, therapeutic x 2 03/09 0352 HL 0.28, subtherapeutic  Plan:  Bolus 900 units x 1 Increase heparin infusion to 850 units/hr Recheck HL in 8 hr after rate change CBC daily while on heparin  Renda Rolls, PharmD, [facility] 02/08/2023 5:22 AM

## 2023-02-08 NOTE — Progress Notes (Signed)
PROGRESS NOTE    Peter Becker   [id number] DOB: 08/12/1929  DOA: 02/06/2023 Date of Service: 02/08/23 PCP: Rusty Aus, MD     Brief Narrative / Hospital Course:  Peter Becker is a 89 y.o. male with medical history significant of sCHF with EF 45%, HTN, HLD, DM, CAD, CABG, PVD, stroke, anxiety, CKD-3a, who presents to ED from home on 02/06/2023 with SOB x1 month not responsive to outpatient tx.  03/07: oxygen saturation 88% on room air which improved to 99% on 2 L oxygen. Trop  205,  BNP 1008, WBC 8.0, stable renal function,  Chest x-ray showed bilateral pleural effusion. Patient is admitted to telemetry bed as inpatient. Dr. Rockey Situ of cardiology was consulted. Started Lasix 40 mg IV bid, continued on IV heparin 03/08: Net IO Since Admission: -1,240 mL [02/07/23 0843]. Cardiology to see. Echo pending.  03/09: Net IO Since Admission: -3,683.42 mL [02/08/23 1858] echo still pending, cardiology saw pt today     Consultants:  Cardiology - [facility]   Procedures: none      ASSESSMENT & PLAN:   Principal Problem:   Acute on chronic systolic CHF (congestive heart failure) ([place]) Active Problems:   CAD (coronary artery disease)   Myocardial injury   Essential hypertension   Hyperlipidemia   Ischemic stroke ([place])   Type II diabetes mellitus with renal manifestations ([place])   Chronic kidney disease, stage 3a ([place])   Anxiety   Acute on chronic systolic CHF (congestive heart failure)  Acute hypoxic respiratory failure d/t HFrEF 2D echo on 02/06/19 16 showed EF of 45%.   Lasix 40 mg bid by IV 2d echo Daily weights, strict I/O's, Low salt diet, Fluid restriction Cardiology following, await echo to help guide management    CAD (coronary artery disease) w/ Hx CABG 2007 Myocardial injury w/ elevated troponin, downtrendign:  trop 205 --> 173, suspect non-ACS demand ischemia  IV heparin is started by EDP --> will continue total 48h (end time  ASA and lipitor 40 mg daily    Essential hypertension IV hydralazine as needed Hold HCTZ since patient is on IV Lasix Cozaar d/c metoprolol  Hyperlipidemia Lipitor   Ischemic stroke ([facility]) Aspirin and Lipitor   Type II diabetes mellitus with renal manifestations ([facility]) Hyperglycemia Recent A1c 8.4, decently controlled overall.  Blood sugar 321.   hold home Actos, Amaryl Hypoglycemia, stopped insulin    Chronic kidney disease, stage 3a ([place]):  Stable Follow-up BMP especially on Lasix    Anxiety Rx as needed    DVT prophylaxis: on heparin gtt pending cardiology evaluation  Pertinent IV fluids/nutrition: no IV fluids, cardiac/carb diet  Central lines / invasive devices: none  Code Status: DNR  Current Admission Status: inpatient   TOC needs / Dispo plan: anticipate d/c home may need HH/DME, pend PT/OT eval Barriers to discharge / significant pending items: clinical improvement, wean O2, continue diuresis              Subjective / Brief ROS:  Patient reports feeling better Denies CP/SOB. He is on 3L O2  Pain controlled.  Denies new weakness.  Tolerating diet.  Reports no concerns w/ urination/defecation.   Family Communication: daughter and son are at bedside on rounds     Objective Findings:  Vitals:   02/08/23 0808 02/08/23 1246 02/08/23 1622 02/08/23 1808  BP: 109/61 (!) 85/50 (!) 92/52 (!) 97/53  Pulse: 79 67 69   Resp:  20 18   Temp:  98 F (36.7 C) (!)  97.5 F (36.4 C)   TempSrc:  Oral Oral   SpO2: 90% 98% 97%   Weight:      Height:        Intake/Output Summary (Last 24 hours) at 02/08/2023 1859 Last data filed at 02/08/2023 0931 Gross per 24 hour  Intake --  Output 1600 ml  Net -1600 ml   Filed Weights   02/06/23 1215 02/07/23 1435  Weight: 61.2 kg 61 kg    Examination:  Physical Exam Constitutional:      General: He is not in acute distress.    Appearance: He is well-developed.  Cardiovascular:     Rate and Rhythm: Normal rate and regular rhythm.   Pulmonary:     Effort: No tachypnea.     Breath sounds: Examination of the right-lower field reveals rales. Examination of the left-lower field reveals rales. Rales (improved from yesterday) present.  Musculoskeletal:     Right lower leg: No edema.     Left lower leg: No edema.  Skin:    General: Skin is warm and dry.  Neurological:     General: No focal deficit present.     Mental Status: He is alert and oriented to person, place, and time.  Psychiatric:        Mood and Affect: Mood normal.        Behavior: Behavior normal.          Scheduled Medications:   aspirin EC  81 mg Oral Daily   atorvastatin  40 mg Oral Daily   furosemide  20 mg Intravenous Q12H   tamsulosin  0.4 mg Oral Daily    Continuous Infusions:    PRN Medications:  acetaminophen, albuterol, dextromethorphan-guaiFENesin, diphenhydrAMINE, hydrALAZINE, LORazepam, nitroGLYCERIN  Antimicrobials from admission:  Anti-infectives (From admission, onward)    None           Data Reviewed:  I have personally reviewed the following...  CBC: Recent Labs  Lab 02/06/23 1221 02/07/23 0440 02/08/23 0352  WBC 8.0 7.8 9.4  HGB 12.9* 11.7* 11.5*  HCT 41.0 37.8* 36.3*  MCV 101.2* 100.3* 99.5  PLT 205 183 [id number]   Basic Metabolic Panel: Recent Labs  Lab 02/06/23 1221 02/07/23 0440 02/08/23 0352  NA 142 143 140  K 4.4 3.9 4.1  CL 107 105 101  CO2 '27 30 29  '$ GLUCOSE 321* 47* 81  BUN 34* 31* 31*  CREATININE 1.44* 1.28* 1.30*  CALCIUM 8.7* 8.3* 8.1*  MG  --  2.0  --    GFR: Estimated Creatinine Clearance: 31.3 mL/min (A) (by C-G formula based on SCr of 1.3 mg/dL (H)). Liver Function Tests: No results for input(s): "AST", "ALT", "ALKPHOS", "BILITOT", "PROT", "ALBUMIN" in the last 168 hours. No results for input(s): "LIPASE", "AMYLASE" in the last 168 hours. No results for input(s): "AMMONIA" in the last 168 hours. Coagulation Profile: Recent Labs  Lab 02/06/23 1610  INR 1.1   Cardiac  Enzymes: No results for input(s): "CKTOTAL", "CKMB", "CKMBINDEX", "TROPONINI" in the last 168 hours. BNP (last 3 results) No results for input(s): "PROBNP" in the last 8760 hours. HbA1C: Recent Labs    02/06/23 1710  HGBA1C 11.3*   CBG: Recent Labs  Lab 02/07/23 2121 02/08/23 0758 02/08/23 0922 02/08/23 1244 02/08/23 1623  GLUCAP 193* 47* 74 88 132*   Lipid Profile: Recent Labs    02/07/23 0440  CHOL 126  HDL 77  LDLCALC 39  TRIG 49  CHOLHDL 1.6   Thyroid Function Tests: No results for  input(s): "TSH", "T4TOTAL", "FREET4", "T3FREE", "THYROIDAB" in the last 72 hours. Anemia Panel: No results for input(s): "VITAMINB12", "FOLATE", "FERRITIN", "TIBC", "IRON", "RETICCTPCT" in the last 72 hours. Most Recent Urinalysis On File:     Component Value Date/Time   COLORURINE YELLOW 11/23/2017 [place] 11/23/2017 1435   LABSPEC 1.025 11/23/2017 1435   PHURINE 5.0 11/23/2017 1435   GLUCOSEU 50 (A) 11/23/2017 1435   HGBUR NEGATIVE 11/23/2017 1435   BILIRUBINUR NEGATIVE 11/23/2017 1435   KETONESUR 5 (A) 11/23/2017 1435   PROTEINUR 100 (A) 11/23/2017 1435   NITRITE NEGATIVE 11/23/2017 1435   LEUKOCYTESUR NEGATIVE 11/23/2017 1435   Sepsis Labs: '@LABRCNTIP'$ (procalcitonin:4,lacticidven:4) Microbiology: Recent Results (from the past 240 hour(s))  Resp panel by RT-PCR (RSV, Flu A&B, Covid) Anterior Nasal Swab     Status: None   Collection Time: 02/06/23 12:21 PM   Specimen: Anterior Nasal Swab  Result Value Ref Range Status   SARS Coronavirus 2 by RT PCR NEGATIVE NEGATIVE Final    Comment: (NOTE) SARS-CoV-2 target nucleic acids are NOT DETECTED.  The SARS-CoV-2 RNA is generally detectable in upper respiratory specimens during the acute phase of infection. The lowest concentration of SARS-CoV-2 viral copies this assay can detect is 138 copies/mL. A negative result does not preclude SARS-Cov-2 infection and should not be used as the sole basis for treatment  or other patient management decisions. A negative result may occur with  improper specimen collection/handling, submission of specimen other than nasopharyngeal swab, presence of viral mutation(s) within the areas targeted by this assay, and inadequate number of viral copies(<138 copies/mL). A negative result must be combined with clinical observations, patient history, and epidemiological information. The expected result is Negative.  Fact Sheet for Patients:  EntrepreneurPulse.com.au  Fact Sheet for Healthcare Providers:  IncredibleEmployment.be  This test is no t yet approved or cleared by the [place] FDA and  has been authorized for detection and/or diagnosis of SARS-CoV-2 by FDA under an Emergency Use Authorization (EUA). This EUA will remain  in effect (meaning this test can be used) for the duration of the COVID-19 declaration under Section 564(b)(1) of the Act, 21 U.S.C.section 360bbb-3(b)(1), unless the authorization is terminated  or revoked sooner.       Influenza A by PCR NEGATIVE NEGATIVE Final   Influenza B by PCR NEGATIVE NEGATIVE Final    Comment: (NOTE) The Xpert Xpress SARS-CoV-2/FLU/RSV plus assay is intended as an aid in the diagnosis of influenza from Nasopharyngeal swab specimens and should not be used as a sole basis for treatment. Nasal washings and aspirates are unacceptable for Xpert Xpress SARS-CoV-2/FLU/RSV testing.  Fact Sheet for Patients: EntrepreneurPulse.com.au  Fact Sheet for Healthcare Providers: IncredibleEmployment.be  This test is not yet approved or cleared by the [place] FDA and has been authorized for detection and/or diagnosis of SARS-CoV-2 by FDA under an Emergency Use Authorization (EUA). This EUA will remain in effect (meaning this test can be used) for the duration of the COVID-19 declaration under Section 564(b)(1) of the Act, 21 U.S.C. section  360bbb-3(b)(1), unless the authorization is terminated or revoked.     Resp Syncytial Virus by PCR NEGATIVE NEGATIVE Final    Comment: (NOTE) Fact Sheet for Patients: EntrepreneurPulse.com.au  Fact Sheet for Healthcare Providers: IncredibleEmployment.be  This test is not yet approved or cleared by the [place] FDA and has been authorized for detection and/or diagnosis of SARS-CoV-2 by FDA under an Emergency Use Authorization (EUA). This EUA will remain in effect (meaning this test  can be used) for the duration of the COVID-19 declaration under Section 564(b)(1) of the Act, 21 U.S.C. section 360bbb-3(b)(1), unless the authorization is terminated or revoked.  Performed at [facility], [address] [place], [place] [address]       Radiology Studies last 3 days: DG Chest 2 View  Result Date: 02/06/2023 CLINICAL DATA:  Shortness of breath and cough for several weeks. Productive cough. EXAM: CHEST - 2 VIEW COMPARISON:  Chest radiographs 11/23/2017, 11/21/2017, 03/06/2015 FINDINGS: Status post median sternotomy. Surgical clips again overlie the anterior left mediastinum and pericardium. Cardiac silhouette again appears mildly enlarged. Mediastinal contours are within normal limits. Redemonstration of mild to moderate elevation of the left hemidiaphragm, chronic. Interval increase in mild left and new mild right pleural effusions. Calcified benign granulomata again overlie the left upper lung. No pneumothorax. Mild multilevel degenerative disc changes of the thoracic spine. Right upper quadrant cholecystectomy clips are again seen, viewed on the current lateral view only. Partially visualized abdominal aorta vascular stent. IMPRESSION: Interval increase in mild left and new mild right pleural effusions. Associated basilar atelectasis versus pneumonia. Electronically Signed   By: Yvonne Kendall M.D.   On: 02/06/2023 12:58              LOS: 2 days     Emeterio Reeve, DO [facility] 02/08/2023, 6:59 PM    Dictation software may have been used to generate the above note. Typos may occur and escape review in typed/dictated notes. Please contact Dr Sheppard Coil directly for clarity if needed.  Staff may message me via secure chat in [place]  but this may not receive an immediate response,  please page me for urgent matters!  If 7PM-7AM, please contact night coverage [email or web address]

## 2023-02-08 NOTE — Progress Notes (Addendum)
Rounding Note    Patient Name: Peter Becker Date of Encounter: 02/08/2023  [place] Cardiologist: None     Patient Profile     89 y.o. male with a history of HFrEF, hypertension, hyperlipidemia, type 2 diabetes, coronary artery disease status post CABG CVA, pernicious anemia, anxiety, CKD stage IIIa, who has been seen for acute/chronic shortness of breath worsening over the last 2-4 weeks and elevated high-sensitivity troponin. Echo pending previous 12/18 low normal LV function  Subjective   Patient seen on AM rounds. Denies any chest pain or shortness of breath. -1.9L output in the last 24 hours. Remains on 4L of O2 via [place].  Inpatient Medications    Scheduled Meds:  aspirin EC  81 mg Oral Daily   atorvastatin  40 mg Oral Daily   furosemide  20 mg Intravenous Once   furosemide  40 mg Intravenous Q12H   losartan  25 mg Oral Daily   metoprolol tartrate  12.5 mg Oral BID   tamsulosin  0.4 mg Oral Daily   Continuous Infusions:  heparin 850 Units/hr (02/08/23 0529)   PRN Meds: acetaminophen, albuterol, dextromethorphan-guaiFENesin, diphenhydrAMINE, hydrALAZINE, LORazepam, nitroGLYCERIN   Vital Signs    Vitals:   02/07/23 1940 02/07/23 2343 02/08/23 0435 02/08/23 0808  BP: 115/67 99/62 (!) 94/58 109/61  Pulse: 87 77 69 79  Resp: '18 18 18   '$ Temp: 98.1 F (36.7 C) 97.7 F (36.5 C) 97.6 F (36.4 C)   TempSrc:      SpO2: 96% 97% 97% 90%  Weight:      Height:        Intake/Output Summary (Last 24 hours) at 02/08/2023 1227 Last data filed at 02/08/2023 0931 Gross per 24 hour  Intake 106.58 ml  Output 2050 ml  Net -1943.42 ml      02/07/2023    2:35 PM 02/06/2023   12:15 PM 01/26/2018    9:40 AM  Last 3 Weights  Weight (lbs) 134 lb 7.7 oz 135 lb 125 lb 9.6 oz  Weight (kg) 61 kg 61.236 kg 56.972 kg      Telemetry    Sinus 70-90 - Personally Reviewed  ECG    No new tracings - Personally Reviewed  Physical Exam   GEN: No acute distress.   Neck:  No JVD Cardiac: RRR, no murmurs, rubs, or gallops.  Respiratory: Coarse, rales in bases to auscultation bilaterally. Remains on 4L O2 via [place] GI: Soft, nontender, non-distended  MS: No edema; No deformity. Neuro:  Nonfocal  Psych: Normal affect   Labs    High Sensitivity Troponin:   Recent Labs  Lab 02/06/23 1221 02/06/23 1610  TROPONINIHS 205* 173*     Chemistry Recent Labs  Lab 02/06/23 1221 02/07/23 0440 02/08/23 0352  NA 142 143 140  K 4.4 3.9 4.1  CL 107 105 101  CO2 '27 30 29  '$ GLUCOSE 321* 47* 81  BUN 34* 31* 31*  CREATININE 1.44* 1.28* 1.30*  CALCIUM 8.7* 8.3* 8.1*  MG  --  2.0  --   GFRNONAA 46* 53* 52*  ANIONGAP '8 8 10    '$ Lipids  Recent Labs  Lab 02/07/23 0440  CHOL 126  TRIG 49  HDL 77  LDLCALC 39  CHOLHDL 1.6    Hematology Recent Labs  Lab 02/06/23 1221 02/07/23 0440 02/08/23 0352  WBC 8.0 7.8 9.4  RBC 4.05* 3.77* 3.65*  HGB 12.9* 11.7* 11.5*  HCT 41.0 37.8* 36.3*  MCV 101.2* 100.3* 99.5  MCH 31.9 31.0 31.5  MCHC 31.5 31.0 31.7  RDW 13.1 13.0 12.8  PLT 205 183 182   Thyroid No results for input(s): "TSH", "FREET4" in the last 168 hours.  BNP Recent Labs  Lab 02/06/23 1221  BNP 1,008.1*    DDimer No results for input(s): "DDIMER" in the last 168 hours.   Radiology    DG Chest 2 View  Result Date: 02/06/2023 CLINICAL DATA:  Shortness of breath and cough for several weeks. Productive cough. EXAM: CHEST - 2 VIEW COMPARISON:  Chest radiographs 11/23/2017, 11/21/2017, 03/06/2015 FINDINGS: Status post median sternotomy. Surgical clips again overlie the anterior left mediastinum and pericardium. Cardiac silhouette again appears mildly enlarged. Mediastinal contours are within normal limits. Redemonstration of mild to moderate elevation of the left hemidiaphragm, chronic. Interval increase in mild left and new mild right pleural effusions. Calcified benign granulomata again overlie the left upper lung. No pneumothorax. Mild multilevel  degenerative disc changes of the thoracic spine. Right upper quadrant cholecystectomy clips are again seen, viewed on the current lateral view only. Partially visualized abdominal aorta vascular stent. IMPRESSION: Interval increase in mild left and new mild right pleural effusions. Associated basilar atelectasis versus pneumonia. Electronically Signed   By: Yvonne Kendall M.D.   On: 02/06/2023 12:58    Cardiac Studies   TTE 11/20/2017 Left ventricle: The cavity size was normal. Systolic function was    normal. The estimated ejection fraction was in the range of 50%    to 55%. Hypokinesis of the apical myocardium. Doppler parameters    are consistent with abnormal left ventricular relaxation (grade 1    diastolic dysfunction). Doppler parameters are consistent with    high ventricular filling pressure.  - Aortic valve: Transvalvular velocity was within the normal range.    There was no stenosis. There was no regurgitation. Valve area    (VTI): 2.8 cm^2. Valve area (Vmax): 2.67 cm^2. Valve area    (Vmean): 2.38 cm^2.  - Mitral valve: Transvalvular velocity was within the normal range.    There was no evidence for stenosis. There was mild regurgitation.  - Left atrium: The atrium was moderately dilated.  - Right ventricle: The cavity size was normal. Wall thickness was    normal. Systolic function was normal.  - Right atrium: The atrium was mildly dilated.  - Atrial septum: No defect or patent foramen ovale was identified.  - Tricuspid valve: There was no regurgitation    Assessment & Plan    Elevated high-sensitivity troponin history of coronary disease status post CABG -Patient continues to remain chest pain-free -Likely supply demand mismatch in the setting of elevated BNP, dehydration, possible pneumonia -No ischemic changes noted on EKG or telemetry -High-sensitivity troponins trended flat -Patient started on IV heparin infusion on arrival to the emergency department discontinue  after 48 hours -Continue telemetry monitoring -Continue on aspirin and statin -EKG as needed for changes or pain  Acute on chronic HFimpEF -LVEF in 2018 50-55% -Echocardiogram ordered and pending with recommendations to follow -Chest x-ray revealed associated bilateral atelectasis versus pneumonia -Maintaining oxygen saturations on 3 to 4 L of O2 via nasal cannula -BNP 1008.1 -Continued on losartan, metoprolol, furosemide -Continue with heart failure education -Daily weights, I's and O's, low-sodium diet  Essential hypertension// hypotension -Blood pressure 85/50, soft after given current medication regimen -Had previously been on losartan 12.5 mg daily, metoprolol tartrate 12.5 mg twice daily, furosemide 20 mg IV twice daily -losartan discontinued -Vital signs per unit protocol  Hyperlipidemia -LDL  39 -Continued on atorvastatin 40 mg daily  History of ischemic stroke -Continued on aspirin and statin  CKD stage IIIa -Serum creatinine 1.30 -Baseline serum creatinine 1.3 -Continue to monitor urine output -Daily BMP -Monitor/trend/replete electrolytes as needed -Avoid nephrotoxic agents were able  Type 2 diabetes -Continue insulin therapy -Management per IM     For questions or updates, please contact [place] Please consult [email or web address] for contact info under        Signed, SHERI HAMMOCK, NP  02/08/2023, 12:27 PM    Congestive heart failure acute/chronic question systolic versus diastolic  Troponin mildly  elevated but not consistent with acute coronary syndrome  Coronary artery disease with prior bypass  Hypotension with a history of hypertension  Chronic renal insufficiency Estimated Creatinine Clearance: 31.3 mL/min (A) (by C-G formula based on SCr of 1.3 mg/dL (H)).  Grade 3B  Prior stroke    Feeling better.  With hypotension however, will hold metoprolol.  Losartan is already been held.  I wonder if this was not a contributing driver to  the weakness and the dyspnea.  Await echocardiogram  No symptoms of ischemia continue statins and aspirin

## 2023-02-08 NOTE — TOC Initial Note (Signed)
Transition of Care [facility]) - Initial/Assessment Note    Patient Details  Name: Peter Becker MRN: [id number] Date of Birth: 1929-10-14  Transition [facility] Care [facility]) CM/SW Contact:    Tiburcio Bash, LCSW Phone Number: 02/08/2023, 9:13 AM  Clinical Narrative:                  PCP: Emily Filbert CSW spoke with patient's daughter Peter Congo who reports patient lives with her. Patient is mostly independent, does not use any dme at home, reports he will sometimes hold onto a wall or door to get around. Dianna reports no hx of [place] services, at baseline patient is not on any oxygen. Currently on 4L O2. Explained to daughter [facility] will follow for any dc planning needs closer to patient's medical readiness to dc, including following for any oxygen needs or home health.   No questions or concerns at this time.   Expected Discharge Plan: Home/Self Care Barriers to Discharge: Continued Medical Work up   Patient Goals and CMS Choice Patient states their goals for this hospitalization and ongoing recovery are:: to go home CMS Medicare.gov Compare Post Acute Care list provided to:: Patient Choice offered to / list presented to : Patient      Expected Discharge Plan and Services       Living arrangements for the past 2 months: Single Family Home                                      Prior Living Arrangements/Services Living arrangements for the past 2 months: Single Family Home Lives with:: Adult Children                   Activities of Daily Living Home Assistive Devices/Equipment: Wheelchair ADL Screening (condition at time of admission) Patient's cognitive ability adequate to safely complete daily activities?: Yes Is the patient deaf or have difficulty hearing?: No Does the patient have difficulty seeing, even when wearing glasses/contacts?: No Does the patient have difficulty concentrating, remembering, or making decisions?: Yes Patient able to express need for assistance with  ADLs?: Yes Does the patient have difficulty dressing or bathing?: Yes Independently performs ADLs?: No Communication: Independent Dressing (OT): Needs assistance Is this a change from baseline?: Pre-admission baseline Grooming: Independent Feeding: Independent Bathing: Needs assistance Is this a change from baseline?: Pre-admission baseline Toileting: Needs assistance Is this a change from baseline?: Pre-admission baseline In/Out Bed: Needs assistance Is this a change from baseline?: Pre-admission baseline Walks in Home: Needs assistance Is this a change from baseline?: Pre-admission baseline Does the patient have difficulty walking or climbing stairs?: Yes Weakness of Legs: Both Weakness of Arms/Hands: None  Permission Sought/Granted                  Emotional Assessment              Admission diagnosis:  NSTEMI (non-ST elevated myocardial infarction) ([place]) [I21.4] Acute on chronic systolic CHF (congestive heart failure) ([place]) [I50.23] Patient Active Problem List   Diagnosis Date Noted   Acute on chronic diastolic CHF (congestive heart failure) ([place]) 02/07/2023   Shortness of breath 02/07/2023   Angina pectoris ([place]) 02/07/2023   Acute on chronic systolic CHF (congestive heart failure) ([place]) 02/06/2023   Myocardial injury 02/06/2023   Type II diabetes mellitus with renal manifestations ([place]) 02/06/2023   Chronic kidney disease, stage 3a ([place]) 02/06/2023   Anxiety 02/06/2023  Adult failure to thrive syndrome    Weakness generalized    Aspiration pneumonia (HCC)    Pressure injury of skin 11/25/2017   DNR (do not resuscitate)    Palliative care by specialist    Dysphagia    TB lung, latent    Benign essential HTN    Diabetes mellitus type 2 in nonobese ([place])    Acute blood loss anemia    Altered mental status    Cough    Meningoencephalitis    Seizure (HCC)    FUO (fever of unknown origin)    Ischemic stroke ([place]) 11/19/2017   AAA (abdominal aortic  aneurysm) without rupture ([place]) 01/20/2017   PAD (peripheral artery disease) ([place]) 01/20/2017   CAD (coronary artery disease) 01/20/2017   Essential hypertension 01/20/2017   Hyperlipidemia 01/20/2017   Chronic venous insufficiency 01/20/2017   Venous stasis dermatitis 01/20/2017   PCP:  Rusty Aus, MD Pharmacy:   [facility] [id number], [place] [address] [place] [address] Phone: [phone] Fax: [phone]  [facility] [place] [address] [place] [address] Phone: [phone] Fax: [phone]     Social Determinants of Health (SDOH) Social History: SDOH Screenings   Food Insecurity: No Food Insecurity (02/07/2023)  Housing: Low Risk  (02/07/2023)  Transportation Needs: No Transportation Needs (02/07/2023)  Utilities: Not At Risk (02/07/2023)  Tobacco Use: Medium Risk (02/07/2023)   SDOH Interventions:     Readmission Risk Interventions     No data to display

## 2023-02-09 ENCOUNTER — Inpatient Hospital Stay (HOSPITAL_COMMUNITY)
Admit: 2023-02-09 | Discharge: 2023-02-09 | Disposition: A | Discharge status: Home / Self Care | Payer: Medicare Other | Attending: Cardiovascular Disease | Admitting: Cardiovascular Disease

## 2023-02-09 DIAGNOSIS — I5031 Acute diastolic (congestive) heart failure: Secondary | ICD-10-CM | POA: Diagnosis not present

## 2023-02-09 DIAGNOSIS — I5023 Acute on chronic systolic (congestive) heart failure: Secondary | ICD-10-CM | POA: Diagnosis not present

## 2023-02-09 LAB — GLUCOSE, CAPILLARY
Glucose-Capillary: 110 mg/dL — ABNORMAL HIGH (ref 70–99)
Glucose-Capillary: 146 mg/dL — ABNORMAL HIGH (ref 70–99)
Glucose-Capillary: 172 mg/dL — ABNORMAL HIGH (ref 70–99)
Glucose-Capillary: 231 mg/dL — ABNORMAL HIGH (ref 70–99)

## 2023-02-09 LAB — BASIC METABOLIC PANEL
Anion gap: 7 (ref 5–15)
BUN: 36 mg/dL — ABNORMAL HIGH (ref 8–23)
CO2: 32 mmol/L (ref 22–32)
Calcium: 8.1 mg/dL — ABNORMAL LOW (ref 8.9–10.3)
Chloride: 98 mmol/L (ref 98–111)
Creatinine, Ser: 1.48 mg/dL — ABNORMAL HIGH (ref 0.61–1.24)
GFR, Estimated: 44 mL/min — ABNORMAL LOW (ref >60)
Glucose, Bld: 69 mg/dL — ABNORMAL LOW (ref 70–99)
Potassium: 4 mmol/L (ref 3.5–5.1)
Sodium: 137 mmol/L (ref 135–145)

## 2023-02-09 LAB — ECHOCARDIOGRAM COMPLETE
AR max vel: 2.33 cm2
AV Area VTI: 1.78 cm2
AV Area mean vel: 1.92 cm2
AV Mean grad: 4 mmHg
AV Peak grad: 5.3 mmHg
Ao pk vel: 1.16 m/s
Area-P 1/2: 4.39 cm2
Calc EF: 45.9 %
Height: 67.008 in
S' Lateral: 3.5 cm
Single Plane A2C EF: 41.5 %
Single Plane A4C EF: 53.4 %
Weight: 2158.74 oz

## 2023-02-09 LAB — CBC
HCT: 35.6 % — ABNORMAL LOW (ref 39.0–52.0)
Hemoglobin: 11.5 g/dL — ABNORMAL LOW (ref 13.0–17.0)
MCH: 31.4 pg (ref 26.0–34.0)
MCHC: 32.3 g/dL (ref 30.0–36.0)
MCV: 97.3 fL (ref 80.0–100.0)
Platelets: 187 10*3/uL (ref 150–400)
RBC: 3.66 MIL/uL — ABNORMAL LOW (ref 4.22–5.81)
RDW: 12.6 % (ref 11.5–15.5)
WBC: 6.7 10*3/uL (ref 4.0–10.5)
nRBC: 0 % (ref 0.0–0.2)

## 2023-02-09 MED ORDER — PERFLUTREN LIPID MICROSPHERE
1.0000 mL | INTRAVENOUS | Status: AC | PRN
  Administered 2023-02-09: 4 mL via INTRAVENOUS

## 2023-02-09 MED ORDER — CARVEDILOL 3.125 MG PO TABS
3.1250 mg | ORAL_TABLET | Freq: Two times a day (BID) | ORAL | Status: DC
  Administered 2023-02-09 – 2023-02-12 (×6): 3.125 mg via ORAL
  Filled 2023-02-09 (×7): qty 1

## 2023-02-09 MED ORDER — FUROSEMIDE 40 MG PO TABS
40.0000 mg | ORAL_TABLET | Freq: Every day | ORAL | Status: DC
  Administered 2023-02-10 – 2023-02-13 (×3): 40 mg via ORAL
  Filled 2023-02-09 (×3): qty 1

## 2023-02-09 NOTE — Progress Notes (Signed)
PROGRESS NOTE    Peter Becker   [id number] DOB: 09/03/1929  DOA: 02/06/2023 Date of Service: 02/09/23 PCP: Rusty Aus, MD     Brief Narrative / Hospital Course:  Peter Becker is a 89 y.o. male with medical history significant of sCHF with EF 45%, HTN, HLD, DM, CAD, CABG, PVD, stroke, anxiety, CKD-3a, who presents to ED from home on 02/06/2023 with SOB x1 month not responsive to outpatient tx.  03/07: oxygen saturation 88% on room air which improved to 99% on 2 L oxygen. Trop  205,  BNP 1008, WBC 8.0, stable renal function,  Chest x-ray showed bilateral pleural effusion. Patient is admitted to telemetry bed as inpatient. Dr. Rockey Situ of cardiology was consulted. Started Lasix 40 mg IV bid, continued on IV heparin 03/08: Net IO Since Admission: -1,240 mL [02/07/23 0843]. Cardiology to see. Echo pending.  03/09: Net IO Since Admission: -3,683.42 mL [02/08/23 1858] echo still pending, cardiology saw pt today  03/10: Echo showing EF 35-40%, LV mod decreased fxn, severe hypokinests inf/post wall, akinesis apical area, G2DD, RV fxn mild reduced. Starting low dose carvedilol, work on weaning O2 vs home O2    Consultants:  Cardiology - [facility]   Procedures: none      ASSESSMENT & PLAN:   Principal Problem:   Acute on chronic systolic CHF (congestive heart failure) ([place]) Active Problems:   CAD (coronary artery disease)   Myocardial injury   Essential hypertension   Hyperlipidemia   Ischemic stroke ([place])   Type II diabetes mellitus with renal manifestations (HCC)   Chronic kidney disease, stage 3a (HCC)   Anxiety   Acute on chronic systolic CHF (congestive heart failure)  Acute hypoxic respiratory failure d/t HFrEF 2D echo on 02/06/19 16 showed EF of 45%.   03/10: Echo showing EF 35-40%, LV mod decreased fxn, severe hypokinests inf/post wall, akinesis apical area, G2DD, RV fxn mild reduced.  Lasix 40 mg bid by IV 2d echo as above  Daily weights, strict I/O's, Low salt diet,  Fluid restriction Cardiology following Starting low dose carvedilol work on weaning O2 vs home O2   CAD (coronary artery disease) w/ Hx CABG 2007 Myocardial injury w/ elevated troponin, downtrendign:  trop 205 --> 173, suspect non-ACS demand ischemia  IV heparin is started by EDP --> will continue total 48h, pt now off this  ASA and lipitor 40 mg daily   Essential hypertension IV hydralazine as needed Hold HCTZ since patient is on IV Lasix Cozaar d/c Carvedilol started toda   Hyperlipidemia Lipitor   Ischemic stroke (HCC) Aspirin and Lipitor   Type II diabetes mellitus with renal manifestations ([place]) Hyperglycemia Recent A1c 8.4, decently controlled overall.  Blood sugar 321.   hold home Actos, Amaryl Hypoglycemia, stopped insulin    Chronic kidney disease, stage 3a ([place]):  Stable Follow-up BMP especially on Lasix    Anxiety Rx as needed    DVT prophylaxis: on heparin gtt pending cardiology evaluation  Pertinent IV fluids/nutrition: no IV fluids, cardiac/carb diet  Central lines / invasive devices: none  Code Status: DNR  Current Admission Status: inpatient   TOC needs / Dispo plan: anticipate d/c home may need HH/DME, pend PT/OT eval tomorrow Barriers to discharge / significant pending items: clinical improvement, wean O2, continue diuresis              Subjective / Brief ROS:  Patient reports feeling better Denies CP/SOB. He is on 3L O2  Pain controlled.  Denies new weakness.  Tolerating diet.  Reports no concerns w/ urination/defecation.   Family Communication: daughter and son are at bedside on rounds     Objective Findings:  Vitals:   02/09/23 0500 02/09/23 0906 02/09/23 0928 02/09/23 1221  BP:  (!) 110/58 113/75 110/63  Pulse:  81 88 83  Resp:  '20 16 20  '$ Temp:  97.7 F (36.5 C) 98.4 F (36.9 C)   TempSrc:  Oral Oral   SpO2:  95% 100% 99%  Weight: 61.2 kg     Height:        Intake/Output Summary (Last 24 hours) at 02/09/2023  1606 Last data filed at 02/09/2023 1300 Gross per 24 hour  Intake --  Output 1100 ml  Net -1100 ml   Filed Weights   02/06/23 1215 02/07/23 1435 02/09/23 0500  Weight: 61.2 kg 61 kg 61.2 kg    Examination:  Physical Exam Constitutional:      General: He is not in acute distress.    Appearance: He is well-developed.  Cardiovascular:     Rate and Rhythm: Normal rate and regular rhythm.  Pulmonary:     Effort: No tachypnea.     Breath sounds: Examination of the right-lower field reveals rales. Examination of the left-lower field reveals rales. Rales (improved from yesterday) present.  Musculoskeletal:     Right lower leg: No edema.     Left lower leg: No edema.  Skin:    General: Skin is warm and dry.  Neurological:     General: No focal deficit present.     Mental Status: He is alert and oriented to person, place, and time.  Psychiatric:        Mood and Affect: Mood normal.        Behavior: Behavior normal.          Scheduled Medications:   aspirin EC  81 mg Oral Daily   atorvastatin  40 mg Oral Daily   carvedilol  3.125 mg Oral BID WC   [START ON 02/10/2023] furosemide  40 mg Oral Daily   tamsulosin  0.4 mg Oral Daily    Continuous Infusions:    PRN Medications:  acetaminophen, albuterol, dextromethorphan-guaiFENesin, diphenhydrAMINE, hydrALAZINE, LORazepam, nitroGLYCERIN  Antimicrobials from admission:  Anti-infectives (From admission, onward)    None           Data Reviewed:  I have personally reviewed the following...  CBC: Recent Labs  Lab 02/06/23 1221 02/07/23 0440 02/08/23 0352 02/09/23 0428  WBC 8.0 7.8 9.4 6.7  HGB 12.9* 11.7* 11.5* 11.5*  HCT 41.0 37.8* 36.3* 35.6*  MCV 101.2* 100.3* 99.5 97.3  PLT 205 183 182 [id number]   Basic Metabolic Panel: Recent Labs  Lab 02/06/23 1221 02/07/23 0440 02/08/23 0352 02/09/23 0428  NA 142 143 140 137  K 4.4 3.9 4.1 4.0  CL 107 105 101 98  CO2 '27 30 29 '$ 32  GLUCOSE 321* 47* 81 69*  BUN  34* 31* 31* 36*  CREATININE 1.44* 1.28* 1.30* 1.48*  CALCIUM 8.7* 8.3* 8.1* 8.1*  MG  --  2.0  --   --    GFR: Estimated Creatinine Clearance: 27.6 mL/min (A) (by C-G formula based on SCr of 1.48 mg/dL (H)). Liver Function Tests: No results for input(s): "AST", "ALT", "ALKPHOS", "BILITOT", "PROT", "ALBUMIN" in the last 168 hours. No results for input(s): "LIPASE", "AMYLASE" in the last 168 hours. No results for input(s): "AMMONIA" in the last 168 hours. Coagulation Profile: Recent Labs  Lab 02/06/23 1610  INR  1.1   Cardiac Enzymes: No results for input(s): "CKTOTAL", "CKMB", "CKMBINDEX", "TROPONINI" in the last 168 hours. BNP (last 3 results) No results for input(s): "PROBNP" in the last 8760 hours. HbA1C: Recent Labs    02/06/23 1710  HGBA1C 11.3*   CBG: Recent Labs  Lab 02/08/23 1244 02/08/23 1623 02/08/23 2025 02/09/23 0806 02/09/23 1213  GLUCAP 88 132* 149* 110* 146*   Lipid Profile: Recent Labs    02/07/23 0440  CHOL 126  HDL 77  LDLCALC 39  TRIG 49  CHOLHDL 1.6   Thyroid Function Tests: No results for input(s): "TSH", "T4TOTAL", "FREET4", "T3FREE", "THYROIDAB" in the last 72 hours. Anemia Panel: No results for input(s): "VITAMINB12", "FOLATE", "FERRITIN", "TIBC", "IRON", "RETICCTPCT" in the last 72 hours. Most Recent Urinalysis On File:     Component Value Date/Time   COLORURINE YELLOW 11/23/2017 1435   APPEARANCEUR CLEAR 11/23/2017 1435   LABSPEC 1.025 11/23/2017 1435   PHURINE 5.0 11/23/2017 1435   GLUCOSEU 50 (A) 11/23/2017 1435   HGBUR NEGATIVE 11/23/2017 1435   BILIRUBINUR NEGATIVE 11/23/2017 1435   KETONESUR 5 (A) 11/23/2017 1435   PROTEINUR 100 (A) 11/23/2017 1435   NITRITE NEGATIVE 11/23/2017 1435   LEUKOCYTESUR NEGATIVE 11/23/2017 1435   Sepsis Labs: '@LABRCNTIP'$ (procalcitonin:4,lacticidven:4) Microbiology: Recent Results (from the past 240 hour(s))  Resp panel by RT-PCR (RSV, Flu A&B, Covid) Anterior Nasal Swab     Status: None    Collection Time: 02/06/23 12:21 PM   Specimen: Anterior Nasal Swab  Result Value Ref Range Status   SARS Coronavirus 2 by RT PCR NEGATIVE NEGATIVE Final    Comment: (NOTE) SARS-CoV-2 target nucleic acids are NOT DETECTED.  The SARS-CoV-2 RNA is generally detectable in upper respiratory specimens during the acute phase of infection. The lowest concentration of SARS-CoV-2 viral copies this assay can detect is 138 copies/mL. A negative result does not preclude SARS-Cov-2 infection and should not be used as the sole basis for treatment or other patient management decisions. A negative result may occur with  improper specimen collection/handling, submission of specimen other than nasopharyngeal swab, presence of viral mutation(s) within the areas targeted by this assay, and inadequate number of viral copies(<138 copies/mL). A negative result must be combined with clinical observations, patient history, and epidemiological information. The expected result is Negative.  Fact Sheet for Patients:  EntrepreneurPulse.com.au  Fact Sheet for Healthcare Providers:  IncredibleEmployment.be  This test is no t yet approved or cleared by the [place] FDA and  has been authorized for detection and/or diagnosis of SARS-CoV-2 by FDA under an Emergency Use Authorization (EUA). This EUA will remain  in effect (meaning this test can be used) for the duration of the COVID-19 declaration under Section 564(b)(1) of the Act, 21 U.S.C.section 360bbb-3(b)(1), unless the authorization is terminated  or revoked sooner.       Influenza A by PCR NEGATIVE NEGATIVE Final   Influenza B by PCR NEGATIVE NEGATIVE Final    Comment: (NOTE) The Xpert Xpress SARS-CoV-2/FLU/RSV plus assay is intended as an aid in the diagnosis of influenza from Nasopharyngeal swab specimens and should not be used as a sole basis for treatment. Nasal washings and aspirates are unacceptable for  Xpert Xpress SARS-CoV-2/FLU/RSV testing.  Fact Sheet for Patients: EntrepreneurPulse.com.au  Fact Sheet for Healthcare Providers: IncredibleEmployment.be  This test is not yet approved or cleared by the [place] FDA and has been authorized for detection and/or diagnosis of SARS-CoV-2 by FDA under an Emergency Use Authorization (EUA). This EUA will remain in  effect (meaning this test can be used) for the duration of the COVID-19 declaration under Section 564(b)(1) of the Act, 21 U.S.C. section 360bbb-3(b)(1), unless the authorization is terminated or revoked.     Resp Syncytial Virus by PCR NEGATIVE NEGATIVE Final    Comment: (NOTE) Fact Sheet for Patients: EntrepreneurPulse.com.au  Fact Sheet for Healthcare Providers: IncredibleEmployment.be  This test is not yet approved or cleared by the [place] FDA and has been authorized for detection and/or diagnosis of SARS-CoV-2 by FDA under an Emergency Use Authorization (EUA). This EUA will remain in effect (meaning this test can be used) for the duration of the COVID-19 declaration under Section 564(b)(1) of the Act, 21 U.S.C. section 360bbb-3(b)(1), unless the authorization is terminated or revoked.  Performed at [facility], [address], [place], [place] [address]       Radiology Studies last 3 days: ECHOCARDIOGRAM COMPLETE  Result Date: 02/09/2023    ECHOCARDIOGRAM REPORT   Patient Name:   Blythe Clough Date of Exam: 02/09/2023 Medical Rec #:  [id number]   Height:       67.0 in Accession #:    [id number]  Weight:       134.9 lb Date of Birth:  06-May-1929   BSA:          10.711 m Patient Age:    47 years    BP:           109/56 mmHg Patient Gender: M           HR:           85 bpm. Exam Location:  [facility] Procedure: 2D Echo, Color Doppler, Cardiac Doppler and Intracardiac            Opacification Agent Indications:     Acute Diastolic CHF   History:         Patient has prior history of Echocardiogram examinations.                  Previous Myocardial Infarction, Prior CABG; Risk                  Factors:Hypertension and Diabetes.  Sonographer:     L. Thornton-Maynard Referring Phys:  [place] Phys: Ida Rogue MD IMPRESSIONS  1. Left ventricular ejection fraction, by estimation, is 35 to 40%. Left ventricular ejection fraction by 2D MOD biplane is 45.9 %. The left ventricle has moderately decreased function. The left ventricle demonstrates regional wall motion abnormalities (severe hypokinesis of the inferior/posterior wall, akinesis of the apical and peri-apical region). No apical thrombus noted. There is moderate asymmetric left ventricular hypertrophy of the basal-septal segment. Left ventricular diastolic parameters are  consistent with Grade II diastolic dysfunction (pseudonormalization).  2. Right ventricular systolic function is mildly reduced. The right ventricular size is moderately enlarged. There is normal pulmonary artery systolic pressure. The estimated right ventricular systolic pressure is 123456 mmHg.  3. The mitral valve is normal in structure. Mild to moderate mitral valve regurgitation. No evidence of mitral stenosis.  4. Tricuspid valve regurgitation is moderate.  5. The aortic valve has an indeterminant number of cusps. There is mild calcification of the aortic valve. Aortic valve regurgitation is not visualized. Aortic valve sclerosis is present, with no evidence of aortic valve stenosis.  6. The inferior vena cava is normal in size with greater than 50% respiratory variability, suggesting right atrial pressure of 3 mmHg. FINDINGS  Left Ventricle: Left ventricular ejection fraction, by estimation, is  35 to 40%. Left ventricular ejection fraction by PLAX is 35 %. The left ventricle has moderately decreased function. The left ventricle demonstrates regional wall motion abnormalities. Definity contrast agent  was given IV to delineate the left ventricular endocardial borders. The left ventricular internal cavity size was normal in size. There is moderate asymmetric left ventricular hypertrophy of the basal-septal segment. Left ventricular diastolic parameters are consistent with Grade II diastolic dysfunction (pseudonormalization). Right Ventricle: The right ventricular size is moderately enlarged. No increase in right ventricular wall thickness. Right ventricular systolic function is mildly reduced. There is normal pulmonary artery systolic pressure. The tricuspid regurgitant velocity is 2.64 m/s, and with an assumed right atrial pressure of 3 mmHg, the estimated right ventricular systolic pressure is 123456 mmHg. Left Atrium: Left atrial size was normal in size. Right Atrium: Right atrial size was normal in size. Pericardium: There is no evidence of pericardial effusion. Mitral Valve: The mitral valve is normal in structure. There is moderate thickening of the mitral valve leaflet(s). There is moderate calcification of the mitral valve leaflet(s). Mild mitral annular calcification. Mild to moderate mitral valve regurgitation. No evidence of mitral valve stenosis. Tricuspid Valve: The tricuspid valve is normal in structure. Tricuspid valve regurgitation is moderate . No evidence of tricuspid stenosis. Aortic Valve: The aortic valve has an indeterminant number of cusps. There is mild calcification of the aortic valve. Aortic valve regurgitation is not visualized. Aortic valve sclerosis is present, with no evidence of aortic valve stenosis. Aortic valve  mean gradient measures 4.0 mmHg. Aortic valve peak gradient measures 5.3 mmHg. Aortic valve area, by VTI measures 1.78 cm. Pulmonic Valve: The pulmonic valve was normal in structure. Pulmonic valve regurgitation is mild. No evidence of pulmonic stenosis. Aorta: The aortic root is normal in size and structure. Venous: The inferior vena cava is normal in size with greater  than 50% respiratory variability, suggesting right atrial pressure of 3 mmHg. IAS/Shunts: No atrial level shunt detected by color flow Doppler.  LEFT VENTRICLE PLAX 2D LV EF:         Left            Diastology                ventricular     LV e' medial:    4.13 cm/s                ejection        LV E/e' medial:  20.6                fraction by     LV e' lateral:   6.74 cm/s                PLAX is 35      LV E/e' lateral: 12.6                %. LVIDd:         4.20 cm LVIDs:         3.50 cm LV PW:         1.10 cm LV IVS:        1.70 cm LVOT diam:     2.10 cm LV SV:         41 LV SV Index:   24 LVOT Area:     3.46 cm  LV Volumes (MOD) LV vol d, MOD    111.0 ml A2C: LV vol d, MOD    78.9 ml A4C: LV vol  s, MOD    64.9 ml A2C: LV vol s, MOD    36.8 ml A4C: LV SV MOD A2C:   46.1 ml LV SV MOD A4C:   78.9 ml LV SV MOD BP:    43.2 ml RIGHT VENTRICLE RV Basal diam:  3.80 cm RV S prime:     8.16 cm/s TAPSE (M-mode): 1.6 cm LEFT ATRIUM             Index        RIGHT ATRIUM           Index LA diam:        4.10 cm 2.40 cm/m   RA Area:     20.50 cm LA Vol (A2C):   65.6 ml 38.35 ml/m  RA Volume:   64.30 ml  37.59 ml/m LA Vol (A4C):   88.1 ml 51.50 ml/m LA Biplane Vol: 80.4 ml 47.00 ml/m  AORTIC VALVE                    PULMONIC VALVE AV Area (Vmax):    2.33 cm     PV Vmax:          0.83 m/s AV Area (Vmean):   1.92 cm     PV Peak grad:     2.8 mmHg AV Area (VTI):     1.78 cm     PR End Diast Vel: 3.80 msec AV Vmax:           115.50 cm/s AV Vmean:          87.100 cm/s AV VTI:            0.229 m AV Peak Grad:      5.3 mmHg AV Mean Grad:      4.0 mmHg LVOT Vmax:         77.60 cm/s LVOT Vmean:        48.200 cm/s LVOT VTI:          0.118 m LVOT/AV VTI ratio: 0.52  AORTA Ao Root diam: 3.40 cm Ao Asc diam:  3.30 cm MITRAL VALVE               TRICUSPID VALVE MV Area (PHT): 4.39 cm    TR Peak grad:   27.9 mmHg MV Decel Time: 173 msec    TR Vmax:        264.00 cm/s MV E velocity: 84.90 cm/s MV A velocity: 82.80 cm/s  SHUNTS MV E/A  ratio:  1.03        Systemic VTI:  0.12 m                            Systemic Diam: 2.10 cm Ida Rogue MD Electronically signed by Ida Rogue MD Signature Date/Time: 02/09/2023/12:45:04 PM    Final    DG Chest 2 View  Result Date: 02/06/2023 CLINICAL DATA:  Shortness of breath and cough for several weeks. Productive cough. EXAM: CHEST - 2 VIEW COMPARISON:  Chest radiographs 11/23/2017, 11/21/2017, 03/06/2015 FINDINGS: Status post median sternotomy. Surgical clips again overlie the anterior left mediastinum and pericardium. Cardiac silhouette again appears mildly enlarged. Mediastinal contours are within normal limits. Redemonstration of mild to moderate elevation of the left hemidiaphragm, chronic. Interval increase in mild left and new mild right pleural effusions. Calcified benign granulomata again overlie the left upper lung. No pneumothorax. Mild multilevel degenerative disc changes of the thoracic spine. Right upper quadrant cholecystectomy clips  are again seen, viewed on the current lateral view only. Partially visualized abdominal aorta vascular stent. IMPRESSION: Interval increase in mild left and new mild right pleural effusions. Associated basilar atelectasis versus pneumonia. Electronically Signed   By: Yvonne Kendall M.D.   On: 02/06/2023 12:58             LOS: 3 days     Emeterio Reeve, DO [facility] 02/09/2023, 4:06 PM    Dictation software may have been used to generate the above note. Typos may occur and escape review in typed/dictated notes. Please contact Dr Sheppard Coil directly for clarity if needed.  Staff may message me via secure chat in [place]  but this may not receive an immediate response,  please page me for urgent matters!  If 7PM-7AM, please contact night coverage [email or web address]

## 2023-02-09 NOTE — Progress Notes (Signed)
Rounding Note    Patient Name: Peter Becker Date of Encounter: 02/09/2023  [place] Cardiologist: None     Patient Profile     89 y.o. male with a history of HFrEF, hypertension, hyperlipidemia, type 2 diabetes, coronary artery disease status post CABG CVA, pernicious anemia, anxiety, CKD stage IIIa, who has been seen for acute/chronic shortness of breath worsening over the last 2-4 weeks and elevated high-sensitivity troponin. Echo pending previous 12/18 low normal LV function  Chart review >>AAA  family unaware  Subjective   Patient seen on AM rounds.  No chest pain or shortness of breath  Inpatient Medications    Scheduled Meds:  aspirin EC  81 mg Oral Daily   atorvastatin  40 mg Oral Daily   furosemide  20 mg Intravenous Q12H   tamsulosin  0.4 mg Oral Daily   Continuous Infusions:   PRN Meds: acetaminophen, albuterol, dextromethorphan-guaiFENesin, diphenhydrAMINE, hydrALAZINE, LORazepam, nitroGLYCERIN   Vital Signs    Vitals:   02/09/23 0424 02/09/23 0500 02/09/23 0906 02/09/23 0928  BP: (!) 109/56  (!) 110/58 113/75  Pulse: 82  81 88  Resp: '17  20 16  '$ Temp: 98.1 F (36.7 C)  97.7 F (36.5 C) 98.4 F (36.9 C)  TempSrc: Oral  Oral Oral  SpO2: 92%  95% 100%  Weight:  61.2 kg    Height:        Intake/Output Summary (Last 24 hours) at 02/09/2023 1036 Last data filed at 02/09/2023 0400 Gross per 24 hour  Intake --  Output 500 ml  Net -500 ml       02/09/2023    5:00 AM 02/07/2023    2:35 PM 02/06/2023   12:15 PM  Last 3 Weights  Weight (lbs) 134 lb 14.7 oz 134 lb 7.7 oz 135 lb  Weight (kg) 61.2 kg 61 kg 61.236 kg      Telemetry    sinus Personally Reviewed  ECG    No new tracings - Personally Reviewed  Physical Exam   Well developed and nourished in no acute distress still on 4 L  HENT normal Neck supple Clear but decreased Regular rate and rhythm, no murmurs or gallops Abd-soft with active BS No Clubbing cyanosis  edema Skin-warm and dry A & Oriented  Grossly normal sensory and motor function     Labs    High Sensitivity Troponin:   Recent Labs  Lab 02/06/23 1221 02/06/23 1610  TROPONINIHS 205* 173*      Chemistry Recent Labs  Lab 02/07/23 0440 02/08/23 0352 02/09/23 0428  NA 143 140 137  K 3.9 4.1 4.0  CL 105 101 98  CO2 30 29 32  GLUCOSE 47* 81 69*  BUN 31* 31* 36*  CREATININE 1.28* 1.30* 1.48*  CALCIUM 8.3* 8.1* 8.1*  MG 2.0  --   --   GFRNONAA 53* 52* 44*  ANIONGAP '8 10 7     '$ Lipids  Recent Labs  Lab 02/07/23 0440  CHOL 126  TRIG 49  HDL 77  LDLCALC 39  CHOLHDL 1.6     Hematology Recent Labs  Lab 02/07/23 0440 02/08/23 0352 02/09/23 0428  WBC 7.8 9.4 6.7  RBC 3.77* 3.65* 3.66*  HGB 11.7* 11.5* 11.5*  HCT 37.8* 36.3* 35.6*  MCV 100.3* 99.5 97.3  MCH 31.0 31.5 31.4  MCHC 31.0 31.7 32.3  RDW 13.0 12.8 12.6  PLT 183 182 187    Thyroid No results for input(s): "TSH", "FREET4" in the last  168 hours.  BNP Recent Labs  Lab 02/06/23 1221  BNP 1,008.1*     DDimer No results for input(s): "DDIMER" in the last 168 hours.   Radiology    No results found.  Cardiac Studies   TTE 11/20/2017 Left ventricle: The cavity size was normal. Systolic function was    normal. The estimated ejection fraction was in the range of 50%    to 55%. Hypokinesis of the apical myocardium. Doppler parameters    are consistent with abnormal left ventricular relaxation (grade 1    diastolic dysfunction). Doppler parameters are consistent with    high ventricular filling pressure.  - Aortic valve: Transvalvular velocity was within the normal range.    There was no stenosis. There was no regurgitation. Valve area    (VTI): 2.8 cm^2. Valve area (Vmax): 2.67 cm^2. Valve area    (Vmean): 2.38 cm^2.  - Mitral valve: Transvalvular velocity was within the normal range.    There was no evidence for stenosis. There was mild regurgitation.  - Left atrium: The atrium was moderately  dilated.  - Right ventricle: The cavity size was normal. Wall thickness was    normal. Systolic function was normal.  - Right atrium: The atrium was mildly dilated.  - Atrial septum: No defect or patent foramen ovale was identified.  - Tricuspid valve: There was no regurgitation    Assessment & Plan  Acute/chronic heart failure?  Ejection fraction previously low more recently better echo pending  Elevated troponin  Coronary artery disease with prior bypass surgery  Hypertension-history  Hypotension  Ischemic stroke-remote unclear diagnosis MRI and CAT scan were negative.  He was largely unresponsive at the time of transfer to residential hospice and he awakened.  Chronic kidney disease Estimated Creatinine Clearance: 27.6 mL/min (A) (by C-G formula based on SCr of 1.48 mg/dL (H)).  Grade 4  Abdominal aortic aneurysm 2016 4.8 cm  BUN and creatinine are elevated, we will discontinue intravenous furosemide and transition to oral.  Blood pressure remains low despite the fact that he is off all of his heart failure medications.  Will check a cortisol  No symptoms of ischemia  Continue atorvastatin and  aspirin for his vascular disease  With + aneurysm, his son should be screened-- his daughter need not be ( uptoDate)    Echo is pending

## 2023-02-10 ENCOUNTER — Inpatient Hospital Stay: Payer: Medicare Other

## 2023-02-10 DIAGNOSIS — I5023 Acute on chronic systolic (congestive) heart failure: Secondary | ICD-10-CM | POA: Diagnosis not present

## 2023-02-10 LAB — GLUCOSE, CAPILLARY
Glucose-Capillary: 142 mg/dL — ABNORMAL HIGH (ref 70–99)
Glucose-Capillary: 267 mg/dL — ABNORMAL HIGH (ref 70–99)
Glucose-Capillary: 212 mg/dL — ABNORMAL HIGH (ref 70–99)

## 2023-02-10 LAB — BASIC METABOLIC PANEL
Anion gap: 3 — ABNORMAL LOW (ref 5–15)
BUN: 39 mg/dL — ABNORMAL HIGH (ref 8–23)
CO2: 33 mmol/L — ABNORMAL HIGH (ref 22–32)
Calcium: 7.8 mg/dL — ABNORMAL LOW (ref 8.9–10.3)
Chloride: 96 mmol/L — ABNORMAL LOW (ref 98–111)
Creatinine, Ser: 1.53 mg/dL — ABNORMAL HIGH (ref 0.61–1.24)
GFR, Estimated: 42 mL/min — ABNORMAL LOW (ref >60)
Glucose, Bld: 171 mg/dL — ABNORMAL HIGH (ref 70–99)
Potassium: 4.3 mmol/L (ref 3.5–5.1)
Sodium: 132 mmol/L — ABNORMAL LOW (ref 135–145)

## 2023-02-10 LAB — CORTISOL: Cortisol, Plasma: 12.5 ug/dL

## 2023-02-10 MED ORDER — FUROSEMIDE 10 MG/ML IJ SOLN
40.0000 mg | Freq: Once | INTRAMUSCULAR | Status: AC
  Administered 2023-02-10: 40 mg via INTRAVENOUS
  Filled 2023-02-10: qty 4

## 2023-02-10 NOTE — Progress Notes (Signed)
Rounding Note    Patient Name: Peter Becker Date of Encounter: 02/10/2023  [place] Cardiologist: None   Subjective   Patient seen on a.m. rounds.  Denies any chest pain or shortness of breath.  Family remains at the bedside.  1.4 L of output in the last 24 hours.   Inpatient Medications    Scheduled Meds:  aspirin EC  81 mg Oral Daily   atorvastatin  40 mg Oral Daily   carvedilol  3.125 mg Oral BID WC   furosemide  40 mg Oral Daily   tamsulosin  0.4 mg Oral Daily   Continuous Infusions:  PRN Meds: acetaminophen, albuterol, dextromethorphan-guaiFENesin, diphenhydrAMINE, hydrALAZINE, LORazepam, nitroGLYCERIN   Vital Signs    Vitals:   02/09/23 2347 02/10/23 0431 02/10/23 0840 02/10/23 0845  BP: 123/64 116/63  122/60  Pulse: 85 82  89  Resp: '19 17  18  '$ Temp: (!) 97.3 F (36.3 C) (!) 97.5 F (36.4 C)  97.8 F (36.6 C)  TempSrc: Oral Oral    SpO2: 95% 96%  91%  Weight:   59 kg   Height:        Intake/Output Summary (Last 24 hours) at 02/10/2023 1025 Last data filed at 02/10/2023 0900 Gross per 24 hour  Intake 240 ml  Output 1650 ml  Net -1410 ml      02/10/2023    8:40 AM 02/09/2023    5:00 AM 02/07/2023    2:35 PM  Last 3 Weights  Weight (lbs) 130 lb 1.1 oz 134 lb 14.7 oz 134 lb 7.7 oz  Weight (kg) 59 kg 61.2 kg 61 kg      Telemetry    Currently not on telemetry- Personally Reviewed  ECG    New tracings- Personally Reviewed  Physical Exam   GEN: No acute distress.   Neck: No JVD Cardiac: RRR, no murmurs, rubs, or gallops.  Respiratory: Clear and diminished to auscultation bilaterally.  Respirations are unlabored at rest on 4 L of O2 via nasal cannula GI: Soft, nontender, non-distended  MS: No edema; No deformity. Neuro:  Nonfocal  Psych: Normal affect   Labs    High Sensitivity Troponin:   Recent Labs  Lab 02/06/23 1221 02/06/23 1610  TROPONINIHS 205* 173*     Chemistry Recent Labs  Lab 02/07/23 0440 02/08/23 0352  02/09/23 0428 02/10/23 0425  NA 143 140 137 132*  K 3.9 4.1 4.0 4.3  CL 105 101 98 96*  CO2 30 29 32 33*  GLUCOSE 47* 81 69* 171*  BUN 31* 31* 36* 39*  CREATININE 1.28* 1.30* 1.48* 1.53*  CALCIUM 8.3* 8.1* 8.1* 7.8*  MG 2.0  --   --   --   GFRNONAA 53* 52* 44* 42*  ANIONGAP '8 10 7 '$ 3*    Lipids  Recent Labs  Lab 02/07/23 0440  CHOL 126  TRIG 49  HDL 77  LDLCALC 39  CHOLHDL 1.6    Hematology Recent Labs  Lab 02/07/23 0440 02/08/23 0352 02/09/23 0428  WBC 7.8 9.4 6.7  RBC 3.77* 3.65* 3.66*  HGB 11.7* 11.5* 11.5*  HCT 37.8* 36.3* 35.6*  MCV 100.3* 99.5 97.3  MCH 31.0 31.5 31.4  MCHC 31.0 31.7 32.3  RDW 13.0 12.8 12.6  PLT 183 182 187   Thyroid No results for input(s): "TSH", "FREET4" in the last 168 hours.  BNP Recent Labs  Lab 02/06/23 1221  BNP 1,008.1*    DDimer No results for input(s): "DDIMER" in the  last 168 hours.   Radiology      Cardiac Studies  TTE 02/09/23 1. Left ventricular ejection fraction, by estimation, is 35 to 40%. Left  ventricular ejection fraction by 2D MOD biplane is 45.9 %. The left  ventricle has moderately decreased function. The left ventricle  demonstrates regional wall motion abnormalities  (severe hypokinesis of the inferior/posterior wall, akinesis of the apical  and peri-apical region). No apical thrombus noted. There is moderate  asymmetric left ventricular hypertrophy of the basal-septal segment. Left  ventricular diastolic parameters are   consistent with Grade II diastolic dysfunction (pseudonormalization).   2. Right ventricular systolic function is mildly reduced. The right  ventricular size is moderately enlarged. There is normal pulmonary artery  systolic pressure. The estimated right ventricular systolic pressure is  123456 mmHg.   3. The mitral valve is normal in structure. Mild to moderate mitral valve  regurgitation. No evidence of mitral stenosis.   4. Tricuspid valve regurgitation is moderate.   5. The  aortic valve has an indeterminant number of cusps. There is mild  calcification of the aortic valve. Aortic valve regurgitation is not  visualized. Aortic valve sclerosis is present, with no evidence of aortic  valve stenosis.   6. The inferior vena cava is normal in size with greater than 50%  respiratory variability, suggesting right atrial pressure of 3 mmHg.    Patient Profile     89 y.o. male with a past medical history of HFrEF, hypertension, hyperlipidemia, type 2 diabetes, coronary artery disease status post CABG x 3 vessel (2007), peripheral vascular disease, CVA, pernicious anemia, anxiety, CKD stage IIIa, who has been seen and evaluated for shortness of breath and elevated high-sensitivity troponin.  Assessment & Plan    Elevated high-sensitivity troponin with a history of coronary disease status post CABG -Patient remains chest pain-free -High-sensitivity troponins trended flat -Patient was maintained on IV heparin for 48 hours and then discontinued -He has been continued on aspirin and statin therapy -EKG for pain or changes as needed -No current plans for invasive testing at this time  Acute on chronic HFrEF -LVEF 35-40%, G2 DD, mild to moderate MR, moderate TR -Maintaining oxygen saturations on 4 L of O2 via nasal cannula -BNP 1008.1 -Heart failure education -Daily weights, I's and O's, low-sodium diet -Was not tolerating previous medications of losartan, metoprolol and furosemide -Furosemide changed from IV to oral metoprolol and losartan discontinued due to hypotension he was started on low-dose carvedilol 3.25 mg twice daily -Continue to escalate GDMT as tolerated by blood pressure and kidney function  Essential hypertension with previous bouts of hypotension -Blood pressure 122/60 -Continued on carvedilol 3.25 mg twice daily and furosemide 40 mg daily -Vitals per unit protocol  Hyperlipidemia -LDL 39 -Continue on atorvastatin 40 mg daily  History of  ischemic stroke -Continued on aspirin and statin  CKD stage IIIa -Serum creatinine 1.53 -Slight increase from yesterday -Daily BMP -Monitor urine output -Monitor/trend/replete electrolytes as needed -Avoid nephrotoxic agents were able  Type 2 diabetes -Continued on insulin therapy -Management per IM     For questions or updates, please contact [place] Please consult [email or web address] for contact info under        Signed, Aeric Burnham, NP  02/10/2023, 10:25 AM

## 2023-02-10 NOTE — Care Management Important Message (Signed)
Important Message  Patient Details  Name: Peter Becker MRN: [id number] Date of Birth: 25-Jun-1929   Medicare Important Message Given:  Yes     Dannette Barbara 02/10/2023, 11:21 AM

## 2023-02-10 NOTE — Evaluation (Signed)
Occupational Therapy Evaluation Patient Details Name: Peter Becker MRN: [id number] DOB: Nov 18, 1929 Today's Date: 02/10/2023   History of Present Illness Peter Becker is a 89 y.o. male with medical history significant of sCHF with EF 45%, HTN, HLD, DM, CAD, CABG, PVD, stroke, anxiety, CKD-3a, who presents to ED from home on 02/06/2023 with SOB x1 month   Clinical Impression   Peter Becker was was seen for OT evaluation this date. Prior to hospital admission, pt was IND. Pt lives with family in home c 4 STE. Pt presents to acute OT demonstrating impaired ADL performance and functional mobility 2/2 decreased activity tolerance and functional strength/balance deficits. Pt currently requires MOD A pericare standing - assist fot thoroughness. Pt requires single UE support static standing. MIN A + RW sit<.stand ~20 ft mobility, +2 for lines mgmt.   SpO2 84% on RA at rest, 86% on 2L [place], resolved to 93% on 3L. Desat 86% with mobility, resolves with cues for PLB (pt noted to be mouth breather). Pt would benefit from skilled OT to address noted impairments and functional limitations (see below for any additional details). Upon hospital discharge, recommend HHOT to maximize pt safety and return to PLOF.    Recommendations for follow up therapy are one component of a multi-disciplinary discharge planning process, led by the attending physician.  Recommendations may be updated based on patient status, additional functional criteria and insurance authorization.   Follow Up Recommendations  Home health OT     Assistance Recommended at Discharge Set up Supervision/Assistance  Patient can return home with the following A little help with walking and/or transfers;A little help with bathing/dressing/bathroom;Help with stairs or ramp for entrance    Functional Status Assessment  Patient has had a recent decline in their functional status and demonstrates the ability to make significant improvements in function in  a reasonable and predictable amount of time.  Equipment Recommendations  BSC/3in1    Recommendations for Other Services       Precautions / Restrictions Precautions Precautions: Fall Restrictions Weight Bearing Restrictions: No      Mobility Bed Mobility Overal bed mobility: Needs Assistance Bed Mobility: Supine to Sit     Supine to sit: Min guard          Transfers Overall transfer level: Needs assistance Equipment used: None Transfers: Sit to/from Stand Sit to Stand: Min guard                  Balance Overall balance assessment: Needs assistance Sitting-balance support: No upper extremity supported, Feet supported Sitting balance-Leahy Scale: Good     Standing balance support: Single extremity supported, During functional activity Standing balance-Leahy Scale: Fair                             ADL either performed or assessed with clinical judgement   ADL Overall ADL's : Needs assistance/impaired                                       General ADL Comments: MOD A pericare standing - assist fot thoroughness. Pt requires single UE support static standing. MIN A + RW simulated toilet t/f, +2 for lines mgmt. MOD I don/doff B socks seated EOB. MIN A don/doff gown in standing. SpO2 84% on RA at rest, 86% on 2L [place], resolved to 93% on 3L. Desat 86%  with mobility, resolves with cues for PLB (pt noted to be mouth breather)      Pertinent Vitals/Pain Pain Assessment Pain Assessment: No/denies pain     Hand Dominance     Extremity/Trunk Assessment Upper Extremity Assessment Upper Extremity Assessment: Overall WFL for tasks assessed   Lower Extremity Assessment Lower Extremity Assessment: Generalized weakness       Communication Communication Communication: HOH   Cognition Arousal/Alertness: Awake/alert Behavior During Therapy: WFL for tasks assessed/performed Overall Cognitive Status: Within Functional Limits for tasks  assessed                                       General Comments  SpO2 84% on RA at rest, 86% on 2L [place], resolved to 93% on 3L. Desat 86% with mobility, resolves with cues for PLB (pt noted to be mouth breather)     Home Living Family/patient expects to be discharged to:: Private residence Living Arrangements: Children Available Help at Discharge: Family;Available 24 hours/day Type of Home: House Home Access: Stairs to enter [employer] of Steps: 4 Entrance Stairs-Rails: Right Home Layout: Multi-level;Able to live on main level with bedroom/bathroom                          Prior Functioning/Environment Prior Level of Function : Independent/Modified Independent             Mobility Comments: no AD baseline          OT Problem List: Decreased strength;Decreased activity tolerance;Impaired balance (sitting and/or standing);Decreased safety awareness      OT Treatment/Interventions: Self-care/ADL training;Therapeutic exercise;Energy conservation;DME and/or AE instruction;Therapeutic activities;Patient/family education;Balance training    OT Goals(Current goals can be found in the care plan section) Acute Rehab OT Goals Patient Stated Goal: to go home OT Goal Formulation: With patient Time For Goal Achievement: 02/24/23 Potential to Achieve Goals: Good ADL Goals Pt Will Perform Grooming: Independently;standing Pt Will Perform Lower Body Dressing: with modified independence;sit to/from stand Pt Will Transfer to Toilet: ambulating;regular height toilet;with modified independence  OT Frequency: Min 2X/week    Co-evaluation PT/OT/SLP Co-Evaluation/Treatment: Yes Reason for Co-Treatment: To address functional/ADL transfers PT goals addressed during session: Mobility/safety with mobility OT goals addressed during session: ADL's and self-care      AM-PAC OT "6 Clicks" Daily Activity     Outcome Measure Help from another person eating  meals?: None Help from another person taking care of personal grooming?: A Little Help from another person toileting, which includes using toliet, bedpan, or urinal?: A Little Help from another person bathing (including washing, rinsing, drying)?: A Little Help from another person to put on and taking off regular upper body clothing?: A Little Help from another person to put on and taking off regular lower body clothing?: A Little 6 Click Score: 19   End of Session Equipment Utilized During Treatment: Rolling walker (2 wheels)  Activity Tolerance: Patient tolerated treatment well Patient left: in chair;with call bell/phone within reach;with chair alarm set;with family/visitor present  OT Visit Diagnosis: Other abnormalities of gait and mobility (R26.89);Muscle weakness (generalized) (M62.81)                Time: 1033-1100 OT Time Calculation (min): 27 min Charges:  OT General Charges $OT Visit: 1 Visit OT Evaluation $OT Eval Moderate Complexity: 1 Mod OT Treatments $Self Care/Home Management : 8-22 mins  Dessie Coma,  M.S. OTR/L  02/10/23, 1:40 PM  ascom [phone]

## 2023-02-10 NOTE — Progress Notes (Signed)
PROGRESS NOTE    PATTY THAM   [id number] DOB: Sep 29, 1929  DOA: 02/06/2023 Date of Service: 02/10/23 PCP: Rusty Aus, MD     Brief Narrative / Hospital Course:  ANAV HECHT is a 89 y.o. male with medical history significant of sCHF with EF 45%, HTN, HLD, DM, CAD, CABG, PVD, stroke, anxiety, CKD-3a, who presents to ED from home on 02/06/2023 with SOB x1 month not responsive to outpatient tx.  03/07: oxygen saturation 88% on room air which improved to 99% on 2 L oxygen. Trop  205,  BNP 1008, WBC 8.0, stable renal function,  Chest x-ray showed bilateral pleural effusion. Patient is admitted to telemetry bed as inpatient. Dr. Rockey Situ of cardiology was consulted. Started Lasix 40 mg IV bid, continued on IV heparin 03/08: Net IO Since Admission: -1,240 mL [02/07/23 0843]. Cardiology to see. Echo pending.  03/09: Net IO Since Admission: -3,683.42 mL [02/08/23 1858] echo still pending, cardiology saw pt today  03/10: Echo showing EF 35-40%, LV mod decreased fxn, severe hypokinests inf/post wall, akinesis apical area, G2DD, RV fxn mild reduced. Starting low dose carvedilol, work on weaning O2 vs home O2 03/11: no further ischemic eval from cardiology, consider resume losartan tomorrow. Later in afternoon RN noted choking on water, SOB requiring increase O2 again. CXR concern for pulmonary edema, infection possible but seems unlikely based on clinical picture. Will trial another IV lasix and follow repeat CXR in AM.     Consultants:  Cardiology - [facility]   Procedures: none      ASSESSMENT & PLAN:   Principal Problem:   Acute on chronic systolic CHF (congestive heart failure) ([place]) Active Problems:   CAD (coronary artery disease)   Myocardial injury   Essential hypertension   Hyperlipidemia   Ischemic stroke ([place])   Type II diabetes mellitus with renal manifestations (H[facility])   Chronic kidney disease, stage 3a (H[facility])   Anxiety   Acute on chronic systolic CHF (congestive heart  failure)  Acute hypoxic respiratory failure d/t HFrEF New recurrence hypoxia w/ concern for aspiration  2D echo on 02/06/19 16 showed EF of 45%.   03/10: Echo showing EF 35-40%, LV mod decreased fxn, severe hypokinests inf/post wall, akinesis apical area, G2DD, RV fxn mild reduced.  Lasix 40 mg bid by IV --> po, giving another dose IV today w/ higher O2 requirement but certainly concern for aspiration  2d echo as above  Daily weights, strict I/O's, Low salt diet, Fluid restriction Cardiology following Starting low dose carvedilol work on weaning O2 vs home O2   CAD (coronary artery disease) w/ Hx CABG 2007 Myocardial injury w/ elevated troponin, downtrendign:  trop 205 --> 173, suspect non-ACS demand ischemia  IV heparin completed ASA and lipitor 40 mg daily   Essential hypertension IV hydralazine as needed Hold HCTZ since patient is on Lasix Cozaar d/c but may restart tomorrow per cardiology  Carvedilol started toda   Hyperlipidemia Lipitor   Ischemic stroke ([place]) Aspirin and Lipitor   Type II diabetes mellitus with renal manifestations ([place]) Hyperglycemia Recent A1c 8.4, decently controlled overall.  Blood sugar 321.   hold home Actos, Amaryl Hypoglycemia, stopped insulin    Chronic kidney disease, stage 3a ([place]):  Stable Follow-up BMP especially on Lasix    Anxiety Rx as needed    DVT prophylaxis: on heparin gtt pending cardiology evaluation  Pertinent IV fluids/nutrition: no IV fluids, cardiac/carb diet  Central lines / invasive devices: none  Code Status: DNR  Current Admission  Status: inpatient   TOC needs / Dispo plan: anticipate d/c home may need HH/DME, pend PT/OT eval tomorrow Barriers to discharge / significant pending items: clinical improvement, wean O2, continue diuresis              Subjective / Brief ROS:  Patient reports feeling okay today Denies CP/SOB. He is on 3L O2  Pain controlled.  Denies new weakness.  Tolerating diet.   Reports no concerns w/ urination/defecation.   Family Communication: daughter at bedside on rounds     Objective Findings:  Vitals:   02/10/23 0845 02/10/23 1305 02/10/23 1640 02/10/23 1644  BP: 122/60 106/64  (!) 143/74  Pulse: 89 85  82  Resp: '18 16  16  '$ Temp: 97.8 F (36.6 C)   98.3 F (36.8 C)  TempSrc:    Oral  SpO2: 91% 98% (S) (!) 82% 94%  Weight:      Height:        Intake/Output Summary (Last 24 hours) at 02/10/2023 1919 Last data filed at 02/10/2023 1430 Gross per 24 hour  Intake 700 ml  Output 900 ml  Net -200 ml   Filed Weights   02/07/23 1435 02/09/23 0500 02/10/23 0840  Weight: 61 kg 61.2 kg 59 kg    Examination:  Physical Exam Constitutional:      General: He is not in acute distress.    Appearance: He is well-developed.  Cardiovascular:     Rate and Rhythm: Normal rate and regular rhythm.  Pulmonary:     Effort: No tachypnea.     Breath sounds: Examination of the right-lower field reveals rales. Examination of the left-lower field reveals rales. Rales (improved from yesterday) present.  Musculoskeletal:     Right lower leg: No edema.     Left lower leg: No edema.  Skin:    General: Skin is warm and dry.  Neurological:     General: No focal deficit present.     Mental Status: He is alert and oriented to person, place, and time.  Psychiatric:        Mood and Affect: Mood normal.        Behavior: Behavior normal.          Scheduled Medications:   aspirin EC  81 mg Oral Daily   atorvastatin  40 mg Oral Daily   carvedilol  3.125 mg Oral BID WC   furosemide  40 mg Intravenous Once   furosemide  40 mg Oral Daily   tamsulosin  0.4 mg Oral Daily    Continuous Infusions:    PRN Medications:  acetaminophen, albuterol, dextromethorphan-guaiFENesin, diphenhydrAMINE, hydrALAZINE, LORazepam, nitroGLYCERIN  Antimicrobials from admission:  Anti-infectives (From admission, onward)    None           Data Reviewed:  I have  personally reviewed the following...  CBC: Recent Labs  Lab 02/06/23 1221 02/07/23 0440 02/08/23 0352 02/09/23 0428  WBC 8.0 7.8 9.4 6.7  HGB 12.9* 11.7* 11.5* 11.5*  HCT 41.0 37.8* 36.3* 35.6*  MCV 101.2* 100.3* 99.5 97.3  PLT 205 183 182 [id number]   Basic Metabolic Panel: Recent Labs  Lab 02/06/23 1221 02/07/23 0440 02/08/23 0352 02/09/23 0428 02/10/23 0425  NA 142 143 140 137 132*  K 4.4 3.9 4.1 4.0 4.3  CL 107 105 101 98 96*  CO2 '27 30 29 '$ 32 33*  GLUCOSE 321* 47* 81 69* 171*  BUN 34* 31* 31* 36* 39*  CREATININE 1.44* 1.28* 1.30* 1.48* 1.53*  CALCIUM 8.7*  8.3* 8.1* 8.1* 7.8*  MG  --  2.0  --   --   --    GFR: Estimated Creatinine Clearance: 25.7 mL/min (A) (by C-G formula based on SCr of 1.53 mg/dL (H)). Liver Function Tests: No results for input(s): "AST", "ALT", "ALKPHOS", "BILITOT", "PROT", "ALBUMIN" in the last 168 hours. No results for input(s): "LIPASE", "AMYLASE" in the last 168 hours. No results for input(s): "AMMONIA" in the last 168 hours. Coagulation Profile: Recent Labs  Lab 02/06/23 1610  INR 1.1   Cardiac Enzymes: No results for input(s): "CKTOTAL", "CKMB", "CKMBINDEX", "TROPONINI" in the last 168 hours. BNP (last 3 results) No results for input(s): "PROBNP" in the last 8760 hours. HbA1C: No results for input(s): "HGBA1C" in the last 72 hours.  CBG: Recent Labs  Lab 02/09/23 1213 02/09/23 1616 02/09/23 2039 02/10/23 0846 02/10/23 1641  GLUCAP 146* 172* 231* 142* 267*   Lipid Profile: No results for input(s): "CHOL", "HDL", "LDLCALC", "TRIG", "CHOLHDL", "LDLDIRECT" in the last 72 hours.  Thyroid Function Tests: No results for input(s): "TSH", "T4TOTAL", "FREET4", "T3FREE", "THYROIDAB" in the last 72 hours. Anemia Panel: No results for input(s): "VITAMINB12", "FOLATE", "FERRITIN", "TIBC", "IRON", "RETICCTPCT" in the last 72 hours. Most Recent Urinalysis On File:     Component Value Date/Time   COLORURINE YELLOW 11/23/2017 [place] 11/23/2017 1435   LABSPEC 1.025 11/23/2017 1435   PHURINE 5.0 11/23/2017 1435   GLUCOSEU 50 (A) 11/23/2017 1435   HGBUR NEGATIVE 11/23/2017 1435   BILIRUBINUR NEGATIVE 11/23/2017 1435   KETONESUR 5 (A) 11/23/2017 1435   PROTEINUR 100 (A) 11/23/2017 1435   NITRITE NEGATIVE 11/23/2017 1435   LEUKOCYTESUR NEGATIVE 11/23/2017 1435   Sepsis Labs: '@LABRCNTIP'$ (procalcitonin:4,lacticidven:4) Microbiology: Recent Results (from the past 240 hour(s))  Resp panel by RT-PCR (RSV, Flu A&B, Covid) Anterior Nasal Swab     Status: None   Collection Time: 02/06/23 12:21 PM   Specimen: Anterior Nasal Swab  Result Value Ref Range Status   SARS Coronavirus 2 by RT PCR NEGATIVE NEGATIVE Final    Comment: (NOTE) SARS-CoV-2 target nucleic acids are NOT DETECTED.  The SARS-CoV-2 RNA is generally detectable in upper respiratory specimens during the acute phase of infection. The lowest concentration of SARS-CoV-2 viral copies this assay can detect is 138 copies/mL. A negative result does not preclude SARS-Cov-2 infection and should not be used as the sole basis for treatment or other patient management decisions. A negative result may occur with  improper specimen collection/handling, submission of specimen other than nasopharyngeal swab, presence of viral mutation(s) within the areas targeted by this assay, and inadequate number of viral copies(<138 copies/mL). A negative result must be combined with clinical observations, patient history, and epidemiological information. The expected result is Negative.  Fact Sheet for Patients:  EntrepreneurPulse.com.au  Fact Sheet for Healthcare Providers:  IncredibleEmployment.be  This test is no t yet approved or cleared by the [place] FDA and  has been authorized for detection and/or diagnosis of SARS-CoV-2 by FDA under an Emergency Use Authorization (EUA). This EUA will remain  in effect (meaning  this test can be used) for the duration of the COVID-19 declaration under Section 564(b)(1) of the Act, 21 U.S.C.section 360bbb-3(b)(1), unless the authorization is terminated  or revoked sooner.       Influenza A by PCR NEGATIVE NEGATIVE Final   Influenza B by PCR NEGATIVE NEGATIVE Final    Comment: (NOTE) The Xpert Xpress SARS-CoV-2/FLU/RSV plus assay is intended as an aid in the diagnosis  of influenza from Nasopharyngeal swab specimens and should not be used as a sole basis for treatment. Nasal washings and aspirates are unacceptable for Xpert Xpress SARS-CoV-2/FLU/RSV testing.  Fact Sheet for Patients: EntrepreneurPulse.com.au  Fact Sheet for Healthcare Providers: IncredibleEmployment.be  This test is not yet approved or cleared by the [place] FDA and has been authorized for detection and/or diagnosis of SARS-CoV-2 by FDA under an Emergency Use Authorization (EUA). This EUA will remain in effect (meaning this test can be used) for the duration of the COVID-19 declaration under Section 564(b)(1) of the Act, 21 U.S.C. section 360bbb-3(b)(1), unless the authorization is terminated or revoked.     Resp Syncytial Virus by PCR NEGATIVE NEGATIVE Final    Comment: (NOTE) Fact Sheet for Patients: EntrepreneurPulse.com.au  Fact Sheet for Healthcare Providers: IncredibleEmployment.be  This test is not yet approved or cleared by the [place] FDA and has been authorized for detection and/or diagnosis of SARS-CoV-2 by FDA under an Emergency Use Authorization (EUA). This EUA will remain in effect (meaning this test can be used) for the duration of the COVID-19 declaration under Section 564(b)(1) of the Act, 21 U.S.C. section 360bbb-3(b)(1), unless the authorization is terminated or revoked.  Performed at [facility], [address], [place], [place] [address]       Radiology Studies  last 3 days: [facility] Chest [facility] 1 View  Result Date: 02/10/2023 CLINICAL DATA:  Shortness of breath and cough EXAM: PORTABLE CHEST 1 VIEW COMPARISON:  02/06/2023, 01/04/2022, CT 11/23/2017 FINDINGS: Post sternotomy changes. Small moderate bilateral pleural effusion, appears slightly increased on the left side. Chronic elevation of left diaphragm. Cardiomegaly with vascular congestion and pulmonary edema. Bibasilar consolidations. Aortic atherosclerosis. No pneumothorax. Left apical calcified nodules consistent with granuloma. IMPRESSION: Cardiomegaly with vascular congestion and pulmonary edema. Small moderate bilateral pleural effusions, slightly increased on the left side. Bibasilar consolidations may be due to atelectasis or pneumonia. Electronically Signed   By: Donavan Foil M.D.   On: 02/10/2023 17:52   ECHOCARDIOGRAM COMPLETE  Result Date: 02/09/2023    ECHOCARDIOGRAM REPORT   Patient Name:   Kedan Vitatoe Date of Exam: 02/09/2023 Medical Rec #:  [id number]   Height:       67.0 in Accession #:    [id number]  Weight:       134.9 lb Date of Birth:  05/25/1929   BSA:          57.711 m Patient Age:    73 years    BP:           109/56 mmHg Patient Gender: M           HR:           85 bpm. Exam Location:  [facility] Procedure: 2D Echo, Color Doppler, Cardiac Doppler and Intracardiac            Opacification Agent Indications:     Acute Diastolic CHF  History:         Patient has prior history of Echocardiogram examinations.                  Previous Myocardial Infarction, Prior CABG; Risk                  Factors:Hypertension and Diabetes.  Sonographer:     L. Thornton-Maynard Referring Phys:  [place] Phys: Ida Rogue MD IMPRESSIONS  1. Left ventricular ejection fraction, by estimation, is 35 to 40%. Left ventricular ejection fraction by 2D  MOD biplane is 45.9 %. The left ventricle has moderately decreased function. The left ventricle demonstrates regional wall motion abnormalities (severe  hypokinesis of the inferior/posterior wall, akinesis of the apical and peri-apical region). No apical thrombus noted. There is moderate asymmetric left ventricular hypertrophy of the basal-septal segment. Left ventricular diastolic parameters are  consistent with Grade II diastolic dysfunction (pseudonormalization).  2. Right ventricular systolic function is mildly reduced. The right ventricular size is moderately enlarged. There is normal pulmonary artery systolic pressure. The estimated right ventricular systolic pressure is 123456 mmHg.  3. The mitral valve is normal in structure. Mild to moderate mitral valve regurgitation. No evidence of mitral stenosis.  4. Tricuspid valve regurgitation is moderate.  5. The aortic valve has an indeterminant number of cusps. There is mild calcification of the aortic valve. Aortic valve regurgitation is not visualized. Aortic valve sclerosis is present, with no evidence of aortic valve stenosis.  6. The inferior vena cava is normal in size with greater than 50% respiratory variability, suggesting right atrial pressure of 3 mmHg. FINDINGS  Left Ventricle: Left ventricular ejection fraction, by estimation, is 35 to 40%. Left ventricular ejection fraction by PLAX is 35 %. The left ventricle has moderately decreased function. The left ventricle demonstrates regional wall motion abnormalities. Definity contrast agent was given IV to delineate the left ventricular endocardial borders. The left ventricular internal cavity size was normal in size. There is moderate asymmetric left ventricular hypertrophy of the basal-septal segment. Left ventricular diastolic parameters are consistent with Grade II diastolic dysfunction (pseudonormalization). Right Ventricle: The right ventricular size is moderately enlarged. No increase in right ventricular wall thickness. Right ventricular systolic function is mildly reduced. There is normal pulmonary artery systolic pressure. The tricuspid regurgitant  velocity is 2.64 m/s, and with an assumed right atrial pressure of 3 mmHg, the estimated right ventricular systolic pressure is 123456 mmHg. Left Atrium: Left atrial size was normal in size. Right Atrium: Right atrial size was normal in size. Pericardium: There is no evidence of pericardial effusion. Mitral Valve: The mitral valve is normal in structure. There is moderate thickening of the mitral valve leaflet(s). There is moderate calcification of the mitral valve leaflet(s). Mild mitral annular calcification. Mild to moderate mitral valve regurgitation. No evidence of mitral valve stenosis. Tricuspid Valve: The tricuspid valve is normal in structure. Tricuspid valve regurgitation is moderate . No evidence of tricuspid stenosis. Aortic Valve: The aortic valve has an indeterminant number of cusps. There is mild calcification of the aortic valve. Aortic valve regurgitation is not visualized. Aortic valve sclerosis is present, with no evidence of aortic valve stenosis. Aortic valve  mean gradient measures 4.0 mmHg. Aortic valve peak gradient measures 5.3 mmHg. Aortic valve area, by VTI measures 1.78 cm. Pulmonic Valve: The pulmonic valve was normal in structure. Pulmonic valve regurgitation is mild. No evidence of pulmonic stenosis. Aorta: The aortic root is normal in size and structure. Venous: The inferior vena cava is normal in size with greater than 50% respiratory variability, suggesting right atrial pressure of 3 mmHg. IAS/Shunts: No atrial level shunt detected by color flow Doppler.  LEFT VENTRICLE PLAX 2D LV EF:         Left            Diastology                ventricular     LV e' medial:    4.13 cm/s  ejection        LV E/e' medial:  20.6                fraction by     LV e' lateral:   6.74 cm/s                PLAX is 35      LV E/e' lateral: 12.6                %. LVIDd:         4.20 cm LVIDs:         3.50 cm LV PW:         1.10 cm LV IVS:        1.70 cm LVOT diam:     2.10 cm LV SV:          41 LV SV Index:   24 LVOT Area:     3.46 cm  LV Volumes (MOD) LV vol d, MOD    111.0 ml A2C: LV vol d, MOD    78.9 ml A4C: LV vol s, MOD    64.9 ml A2C: LV vol s, MOD    36.8 ml A4C: LV SV MOD A2C:   46.1 ml LV SV MOD A4C:   78.9 ml LV SV MOD BP:    43.2 ml RIGHT VENTRICLE RV Basal diam:  3.80 cm RV S prime:     8.16 cm/s TAPSE (M-mode): 1.6 cm LEFT ATRIUM             Index        RIGHT ATRIUM           Index LA diam:        4.10 cm 2.40 cm/m   RA Area:     20.50 cm LA Vol (A2C):   65.6 ml 38.35 ml/m  RA Volume:   64.30 ml  37.59 ml/m LA Vol (A4C):   88.1 ml 51.50 ml/m LA Biplane Vol: 80.4 ml 47.00 ml/m  AORTIC VALVE                    PULMONIC VALVE AV Area (Vmax):    2.33 cm     PV Vmax:          0.83 m/s AV Area (Vmean):   1.92 cm     PV Peak grad:     2.8 mmHg AV Area (VTI):     1.78 cm     PR End Diast Vel: 3.80 msec AV Vmax:           115.50 cm/s AV Vmean:          87.100 cm/s AV VTI:            0.229 m AV Peak Grad:      5.3 mmHg AV Mean Grad:      4.0 mmHg LVOT Vmax:         77.60 cm/s LVOT Vmean:        48.200 cm/s LVOT VTI:          0.118 m LVOT/AV VTI ratio: 0.52  AORTA Ao Root diam: 3.40 cm Ao Asc diam:  3.30 cm MITRAL VALVE               TRICUSPID VALVE MV Area (PHT): 4.39 cm    TR Peak grad:   27.9 mmHg MV Decel Time: 173 msec    TR Vmax:        264.00  cm/s MV E velocity: 84.90 cm/s MV A velocity: 82.80 cm/s  SHUNTS MV E/A ratio:  1.03        Systemic VTI:  0.12 m                            Systemic Diam: 2.10 cm Ida Rogue MD Electronically signed by Ida Rogue MD Signature Date/Time: 02/09/2023/12:45:04 PM    Final              LOS: 4 days     Emeterio Reeve, DO [facility] 02/10/2023, 7:19 PM    Dictation software may have been used to generate the above note. Typos may occur and escape review in typed/dictated notes. Please contact Dr Sheppard Coil directly for clarity if needed.  Staff may message me via secure chat in [place]  but this may not receive  an immediate response,  please page me for urgent matters!  If 7PM-7AM, please contact night coverage [email or web address]

## 2023-02-10 NOTE — Evaluation (Signed)
Physical Therapy Evaluation Patient Details Name: Peter Becker MRN: [id number] DOB: July 31, 1929 Today's Date: 02/10/2023  History of Present Illness  Pt admitted for acute/chronic CHF with complaints of SOB symptoms. HIstory includes CHF, HTN, HLD, DM, CAD, CVA, anxiety, and CKD.  Clinical Impression  CO-evaluation performed with OT. Pt is a pleasant 89 year old male who was admitted for acute/chronic CHF. Pt performs bed mobility/transfers with supervision and ambulation with cga and RW. All mobility performed on RW. Pt demonstrates deficits with strength/mobility/balance. Encouraged to continue to use RW for all mobility. Would benefit from skilled PT to address above deficits and promote optimal return to PLOF. Recommend transition to [place] upon discharge from acute hospitalization. SaO2 on room air at rest = 84% SaO2 on room air while ambulating = n/a% SaO2 on 3 liters of O2 while ambulating = 86-93%  with cues for PLB       Recommendations for follow up therapy are one component of a multi-disciplinary discharge planning process, led by the attending physician.  Recommendations may be updated based on patient status, additional functional criteria and insurance authorization.  Follow Up Recommendations Home health PT      Assistance Recommended at Discharge Intermittent Supervision/Assistance  Patient can return home with the following  A little help with walking and/or transfers;A little help with bathing/dressing/bathroom    Equipment Recommendations Rolling walker (2 wheels)  Recommendations for Other Services       Functional Status Assessment Patient has had a recent decline in their functional status and demonstrates the ability to make significant improvements in function in a reasonable and predictable amount of time.     Precautions / Restrictions Precautions Precautions: Fall Restrictions Weight Bearing Restrictions: No      Mobility  Bed Mobility Overal bed  mobility: Needs Assistance Bed Mobility: Supine to Sit     Supine to sit: Supervision     General bed mobility comments: follows commands well. Safe technique    Transfers Overall transfer level: Needs assistance Equipment used: Rolling walker (2 wheels) Transfers: Sit to/from Stand Sit to Stand: Supervision           General transfer comment: upright posture. Initial standing without UE support with unsteadiness. Improved balance when using RW    Ambulation/Gait Ambulation/Gait assistance: Min guard Gait Distance (Feet): 40 Feet Assistive device: Rolling walker (2 wheels) Gait Pattern/deviations: Step-through pattern       General Gait Details: ambualted using RW. O2 sats decrease to 86% on 3L. Cues for PLB through nose  Stairs            Wheelchair Mobility    Modified Rankin (Stroke Patients Only)       Balance Overall balance assessment: Needs assistance Sitting-balance support: Feet supported Sitting balance-Leahy Scale: Good     Standing balance support: Bilateral upper extremity supported Standing balance-Leahy Scale: Fair                               Pertinent Vitals/Pain Pain Assessment Pain Assessment: No/denies pain    Home Living Family/patient expects to be discharged to:: Private residence Living Arrangements: Children (daughter and son in law) Available Help at Discharge: Family;Available 24 hours/day (daughter works from home and SIL doesn't currently work) Type of Home: [employer] Home Access: Stairs to enter Entrance Stairs-Rails: Right Entrance Stairs-Number of Steps: [place]: Multi-level;Able to live on main level with bedroom/bathroom  Prior Function Prior Level of Function : Independent/Modified Independent             Mobility Comments: reports was previously indep without AD. Reports no falls ADLs Comments: indep     Hand Dominance        Extremity/Trunk Assessment   Upper  Extremity Assessment Upper Extremity Assessment: Overall WFL for tasks assessed    Lower Extremity Assessment Lower Extremity Assessment: Generalized weakness (B LE grossly 4/5)       Communication   Communication: No difficulties  Cognition Arousal/Alertness: Awake/alert Behavior During Therapy: WFL for tasks assessed/performed Overall Cognitive Status: Within Functional Limits for tasks assessed                                 General Comments: flatt affect        General Comments General comments (skin integrity, edema, etc.): SpO2 84% on RA at rest, 86% on 2L [place], resolved to 93% on 3L. Desat 86% with mobility, resolves with cues for PLB (pt noted to be mouth breather)    Exercises Other Exercises Other Exercises: Able to perform standing for linen change due to BM. Able to perform peri care, however mostly performed by therapist.   Assessment/Plan    PT Assessment Patient needs continued PT services  PT Problem List Decreased strength;Decreased balance;Decreased activity tolerance;Decreased mobility       PT Treatment Interventions Gait training;DME instruction;Therapeutic exercise;Balance training    PT Goals (Current goals can be found in the Care Plan section)  Acute Rehab PT Goals Patient Stated Goal: to go home PT Goal Formulation: With patient Time For Goal Achievement: 02/24/23 Potential to Achieve Goals: Good    Frequency Min 2X/week     Co-evaluation PT/OT/SLP Co-Evaluation/Treatment: Yes Reason for Co-Treatment: To address functional/ADL transfers PT goals addressed during session: Mobility/safety with mobility OT goals addressed during session: ADL's and self-care       AM-PAC PT "6 Clicks" Mobility  Outcome Measure Help needed turning from your back to your side while in a flat bed without using bedrails?: A Little Help needed moving from lying on your back to sitting on the side of a flat bed without using bedrails?: A  Little Help needed moving to and from a bed to a chair (including a wheelchair)?: A Little Help needed standing up from a chair using your arms (e.g., wheelchair or bedside chair)?: A Little Help needed to walk in hospital room?: A Little Help needed climbing 3-5 steps with a railing? : A Lot 6 Click Score: 17    End of Session Equipment Utilized During Treatment: Gait belt;Oxygen Activity Tolerance: Patient tolerated treatment well Patient left: in chair;with chair alarm set Nurse Communication: Mobility status PT Visit Diagnosis: Unsteadiness on feet (R26.81);Muscle weakness (generalized) (M62.81);Difficulty in walking, not elsewhere classified (R26.2)    Time: 1031-1100 PT Time Calculation (min) (ACUTE ONLY): 29 min   Charges:   PT Evaluation $PT Eval Low Complexity: 1 Low PT Treatments $Gait Training: 8-22 mins        [name], PT, DPT, GCS [phone]   Loyalty Brashier 02/10/2023, 1:43 PM

## 2023-02-10 NOTE — TOC Progression Note (Signed)
Transition of Care [facility]) - Progression Note    Patient Details  Name: Peter Becker MRN: [id number] Date of Birth: Sep 16, 1929  [facility]) CM/SW Contact  Laurena Slimmer, RN Phone Number: 02/10/2023, 10:04 AM  Clinical Narrative:    Case reviewed for DME needs and changes in discharge disposition.    Expected Discharge Plan: Home/Self Care Barriers to Discharge: Continued Medical Work up  Expected Discharge Plan and Services       Living arrangements for the past 2 months: Single Family Home                                       Social Determinants of Health (SDOH) Interventions SDOH Screenings   Food Insecurity: No Food Insecurity (02/07/2023)  Housing: Low Risk  (02/07/2023)  Transportation Needs: No Transportation Needs (02/07/2023)  Utilities: Not At Risk (02/07/2023)  Tobacco Use: Medium Risk (02/07/2023)    Readmission Risk Interventions     No data to display

## 2023-02-10 NOTE — TOC Progression Note (Signed)
Transition of Care [facility]) - Progression Note    Patient Details  Name: AARIN LANGFITT MRN: [id number] Date of Birth: [birth year]  Transition of Care [facility]) CM/SW Contact  Laurena Slimmer, RN Phone Number: 02/10/2023, 4:26 PM  Clinical Narrative:    Spoke with patient's daughter. She is agreeable for [facility]. She does not have a preference of an agency. She also stated she need personal care services. She was advised she could search online for area agencies.  Referral sent and accetpted by Floydene Flock from [facility].   Expected Discharge Plan: Home/Self Care Barriers to Discharge: Continued Medical Work up  Expected Discharge Plan and Services       Living arrangements for the past 2 months: Single Family Home                                       Social Determinants of Health (SDOH) Interventions SDOH Screenings   Food Insecurity: No Food Insecurity (02/07/2023)  Housing: Low Risk  (02/07/2023)  Transportation Needs: No Transportation Needs (02/07/2023)  Utilities: Not At Risk (02/07/2023)  Tobacco Use: Medium Risk (02/07/2023)    Readmission Risk Interventions     No data to display

## 2023-02-11 ENCOUNTER — Inpatient Hospital Stay: Payer: Medicare Other

## 2023-02-11 DIAGNOSIS — I5023 Acute on chronic systolic (congestive) heart failure: Secondary | ICD-10-CM | POA: Diagnosis not present

## 2023-02-11 LAB — BODY FLUID CELL COUNT WITH DIFFERENTIAL
Eos, Fluid: 0 %
Lymphs, Fluid: 51 %
Monocyte-Macrophage-Serous Fluid: 4 %
Neutrophil Count, Fluid: 45 %
Total Nucleated Cell Count, Fluid: 1105 cu mm

## 2023-02-11 LAB — GLUCOSE, CAPILLARY
Glucose-Capillary: 175 mg/dL — ABNORMAL HIGH (ref 70–99)
Glucose-Capillary: 164 mg/dL — ABNORMAL HIGH (ref 70–99)
Glucose-Capillary: 336 mg/dL — ABNORMAL HIGH (ref 70–99)

## 2023-02-11 LAB — BASIC METABOLIC PANEL
Anion gap: 7 (ref 5–15)
BUN: 37 mg/dL — ABNORMAL HIGH (ref 8–23)
CO2: 34 mmol/L — ABNORMAL HIGH (ref 22–32)
Calcium: 8.2 mg/dL — ABNORMAL LOW (ref 8.9–10.3)
Chloride: 95 mmol/L — ABNORMAL LOW (ref 98–111)
Creatinine, Ser: 1.39 mg/dL — ABNORMAL HIGH (ref 0.61–1.24)
GFR, Estimated: 48 mL/min — ABNORMAL LOW (ref >60)
Glucose, Bld: 191 mg/dL — ABNORMAL HIGH (ref 70–99)
Potassium: 4.3 mmol/L (ref 3.5–5.1)
Sodium: 136 mmol/L (ref 135–145)

## 2023-02-11 LAB — CBC
HCT: 37.1 % — ABNORMAL LOW (ref 39.0–52.0)
Hemoglobin: 11.7 g/dL — ABNORMAL LOW (ref 13.0–17.0)
MCH: 31.4 pg (ref 26.0–34.0)
MCHC: 31.5 g/dL (ref 30.0–36.0)
MCV: 99.5 fL (ref 80.0–100.0)
Platelets: 184 10*3/uL (ref 150–400)
RBC: 3.73 MIL/uL — ABNORMAL LOW (ref 4.22–5.81)
RDW: 12.4 % (ref 11.5–15.5)
WBC: 6.3 10*3/uL (ref 4.0–10.5)
nRBC: 0 % (ref 0.0–0.2)

## 2023-02-11 LAB — PROTEIN, PLEURAL OR PERITONEAL FLUID: Total protein, fluid: <3 g/dL

## 2023-02-11 LAB — LACTATE DEHYDROGENASE, PLEURAL OR PERITONEAL FLUID: LD, Fluid: 63 U/L — ABNORMAL HIGH (ref 3–23)

## 2023-02-11 LAB — LACTATE DEHYDROGENASE: LDH: 169 U/L (ref 98–192)

## 2023-02-11 MED ORDER — LIDOCAINE HCL (PF) 1 % IJ SOLN
10.0000 mL | Freq: Once | INTRAMUSCULAR | Status: AC
  Administered 2023-02-11: 10 mL via INTRADERMAL

## 2023-02-11 MED ORDER — LOSARTAN POTASSIUM 25 MG PO TABS
12.5000 mg | ORAL_TABLET | Freq: Every day | ORAL | Status: DC
  Administered 2023-02-12: 12.5 mg via ORAL
  Filled 2023-02-11: qty 1

## 2023-02-11 MED ORDER — ENOXAPARIN SODIUM 30 MG/0.3ML IJ SOSY
30.0000 mg | PREFILLED_SYRINGE | Freq: Every day | INTRAMUSCULAR | Status: DC
  Administered 2023-02-12 – 2023-02-13 (×2): 30 mg via SUBCUTANEOUS
  Filled 2023-02-11 (×2): qty 0.3

## 2023-02-11 NOTE — Progress Notes (Signed)
PROGRESS NOTE    Peter Becker   [id number] DOB: 20-Aug-1929  DOA: 02/06/2023 Date of Service: 02/11/23 PCP: Rusty Aus, MD     Brief Narrative / Hospital Course:  Peter Becker is a 89 y.o. male with medical history significant of sCHF with EF 45%, HTN, HLD, DM, CAD, CABG, PVD, stroke, anxiety, CKD-3a, who presents to ED from home on 02/06/2023 with SOB x1 month not responsive to outpatient tx.  03/07: oxygen saturation 88% on room air which improved to 99% on 2 L oxygen. Trop  205,  BNP 1008, WBC 8.0, stable renal function,  Chest x-ray showed bilateral pleural effusion. Patient is admitted to telemetry bed as inpatient. Dr. Rockey Situ of cardiology was consulted. Started Lasix 40 mg IV bid, continued on IV heparin 03/08: Net IO Since Admission: -1,240 mL [02/07/23 0843]. Cardiology to see. Echo pending.  03/09: Net IO Since Admission: -3,683.42 mL [02/08/23 1858] echo still pending, cardiology saw pt today  03/10: Echo showing EF 35-40%, LV mod decreased fxn, severe hypokinests inf/post wall, akinesis apical area, G2DD, RV fxn mild reduced. Starting low dose carvedilol, work on weaning O2 vs home O2 03/11: no further ischemic eval from cardiology, consider resume losartan tomorrow. Later in afternoon RN noted choking on water, SOB requiring increase O2 again. CXR concern for pulmonary edema, infection possible but seems unlikely based on clinical picture. Will trial another IV lasix and follow repeat CXR in AM.     Consultants:  Cardiology - [facility]   Procedures: none      ASSESSMENT & PLAN:   Principal Problem:   Acute on chronic systolic CHF (congestive heart failure) ([place]) Active Problems:   CAD (coronary artery disease)   Myocardial injury   Essential hypertension   Hyperlipidemia   Ischemic stroke ([place])   Type II diabetes mellitus with renal manifestations (HCC)   Chronic kidney disease, stage 3a (HCC)   Anxiety   Acute on chronic systolic CHF (congestive heart  failure)  Acute hypoxic respiratory failure d/t HFrEF New recurrence hypoxia w/ concern for aspiration  2D echo on 02/06/19 16 showed EF of 45%.   03/10: Echo showing EF 35-40%, LV mod decreased fxn, severe hypokinests inf/post wall, akinesis apical area, G2DD, RV fxn mild reduced.  Lasix 40 mg bid by IV --> po 2d echo as above  Daily weights, strict I/O's, Low salt diet, Fluid restriction Cardiology following Starting low dose carvedilol   Pleural effusion likely d/t CHF Acute hypoxic respiratory failure d/t HFrEF and pleural effusion  Pend thoracentesis today  CAD (coronary artery disease) w/ Hx CABG 2007 Myocardial injury w/ elevated troponin, downtrendign:  trop 205 --> 173, suspect non-ACS demand ischemia  IV heparin completed ASA and lipitor 40 mg daily   Essential hypertension IV hydralazine as needed Hold HCTZ since patient is on Lasix Cozaar d/c but may restart tomorrow per cardiology  Carvedilol started toda   Hyperlipidemia Lipitor   Ischemic stroke ([place]) Aspirin and Lipitor   Type II diabetes mellitus with renal manifestations ([place]) Hyperglycemia Recent A1c 8.4, decently controlled overall.  Blood sugar 321.   hold home Actos, Amaryl Hypoglycemia, stopped insulin    Chronic kidney disease, stage 3a ([place]):  Stable Follow-up BMP especially on Lasix    Anxiety Rx as needed    DVT prophylaxis: lovenox Pertinent IV fluids/nutrition: no IV fluids, cardiac/carb diet  Central lines / invasive devices: none  Code Status: DNR  Current Admission Status: inpatient   TOC needs / Dispo plan:  anticipate d/c home , currently recs [facility], daughter is asking about SNF and she has been educated on what TOC can and can not help with based on PT/OT recs and insurance  Barriers to discharge / significant pending items: thoracentesis today, wean O2              Subjective / Brief ROS:  Patient reports feeling okay today Denies CP/SOB. Back up to 4L O2.  Pain  controlled.  Denies new weakness.  NPO pending SLP eval and thoracentesis   Family Communication: daughter at bedside on rounds     Objective Findings:  Vitals:   02/11/23 0007 02/11/23 0320 02/11/23 0804 02/11/23 1202  BP: 122/68 (!) 114/58 129/71 127/69  Pulse: 79 81 86 83  Resp: '20 20 20 18  '$ Temp: 97.9 F (36.6 C) 98 F (36.7 C) 97.7 F (36.5 C) 97.6 F (36.4 C)  TempSrc:   Oral Oral  SpO2: 92% 91% 91% 95%  Weight:   57.9 kg   Height:        Intake/Output Summary (Last 24 hours) at 02/11/2023 1305 Last data filed at 02/11/2023 1032 Gross per 24 hour  Intake 240 ml  Output 1450 ml  Net -1210 ml   Filed Weights   02/09/23 0500 02/10/23 0840 02/11/23 0804  Weight: 61.2 kg 59 kg 57.9 kg    Examination:  Physical Exam Constitutional:      General: He is not in acute distress.    Appearance: He is well-developed.  Cardiovascular:     Rate and Rhythm: Normal rate and regular rhythm.  Pulmonary:     Effort: No tachypnea.     Breath sounds: Examination of the right-lower field reveals decreased breath sounds. Examination of the left-lower field reveals decreased breath sounds. Decreased breath sounds and rales (improved from yesterday) present.  Musculoskeletal:     Right lower leg: No edema.     Left lower leg: No edema.  Skin:    General: Skin is warm and dry.  Neurological:     General: No focal deficit present.     Mental Status: He is alert and oriented to person, place, and time.  Psychiatric:        Mood and Affect: Mood normal.        Behavior: Behavior normal.          Scheduled Medications:   aspirin EC  81 mg Oral Daily   atorvastatin  40 mg Oral Daily   carvedilol  3.125 mg Oral BID WC   enoxaparin (LOVENOX) injection  30 mg Subcutaneous Daily   furosemide  40 mg Oral Daily   losartan  12.5 mg Oral Daily   tamsulosin  0.4 mg Oral Daily    Continuous Infusions:    PRN Medications:  acetaminophen, albuterol,  dextromethorphan-guaiFENesin, diphenhydrAMINE, hydrALAZINE, LORazepam, nitroGLYCERIN  Antimicrobials from admission:  Anti-infectives (From admission, onward)    None           Data Reviewed:  I have personally reviewed the following...  CBC: Recent Labs  Lab 02/06/23 1221 02/07/23 0440 02/08/23 0352 02/09/23 0428 02/11/23 0440  WBC 8.0 7.8 9.4 6.7 6.3  HGB 12.9* 11.7* 11.5* 11.5* 11.7*  HCT 41.0 37.8* 36.3* 35.6* 37.1*  MCV 101.2* 100.3* 99.5 97.3 99.5  PLT 205 183 182 187 [id number]   Basic Metabolic Panel: Recent Labs  Lab 02/07/23 0440 02/08/23 0352 02/09/23 0428 02/10/23 0425 02/11/23 0440  NA 143 140 137 132* 136  K 3.9 4.1  4.0 4.3 4.3  CL 105 101 98 96* 95*  CO2 30 29 32 33* 34*  GLUCOSE 47* 81 69* 171* 191*  BUN 31* 31* 36* 39* 37*  CREATININE 1.28* 1.30* 1.48* 1.53* 1.39*  CALCIUM 8.3* 8.1* 8.1* 7.8* 8.2*  MG 2.0  --   --   --   --    GFR: Estimated Creatinine Clearance: 27.8 mL/min (A) (by C-G formula based on SCr of 1.39 mg/dL (H)). Liver Function Tests: No results for input(s): "AST", "ALT", "ALKPHOS", "BILITOT", "PROT", "ALBUMIN" in the last 168 hours. No results for input(s): "LIPASE", "AMYLASE" in the last 168 hours. No results for input(s): "AMMONIA" in the last 168 hours. Coagulation Profile: Recent Labs  Lab 02/06/23 1610  INR 1.1   Cardiac Enzymes: No results for input(s): "CKTOTAL", "CKMB", "CKMBINDEX", "TROPONINI" in the last 168 hours. BNP (last 3 results) No results for input(s): "PROBNP" in the last 8760 hours. HbA1C: No results for input(s): "HGBA1C" in the last 72 hours.  CBG: Recent Labs  Lab 02/10/23 0846 02/10/23 1641 02/10/23 2039 02/11/23 0802 02/11/23 1159  GLUCAP 142* 267* 212* 175* 164*   Lipid Profile: No results for input(s): "CHOL", "HDL", "LDLCALC", "TRIG", "CHOLHDL", "LDLDIRECT" in the last 72 hours.  Thyroid Function Tests: No results for input(s): "TSH", "T4TOTAL", "FREET4", "T3FREE", "THYROIDAB" in  the last 72 hours. Anemia Panel: No results for input(s): "VITAMINB12", "FOLATE", "FERRITIN", "TIBC", "IRON", "RETICCTPCT" in the last 72 hours. Most Recent Urinalysis On File:     Component Value Date/Time   COLORURINE YELLOW 11/23/2017 [place] 11/23/2017 1435   LABSPEC 1.025 11/23/2017 1435   PHURINE 5.0 11/23/2017 1435   GLUCOSEU 50 (A) 11/23/2017 1435   HGBUR NEGATIVE 11/23/2017 1435   BILIRUBINUR NEGATIVE 11/23/2017 1435   KETONESUR 5 (A) 11/23/2017 1435   PROTEINUR 100 (A) 11/23/2017 1435   NITRITE NEGATIVE 11/23/2017 1435   LEUKOCYTESUR NEGATIVE 11/23/2017 1435   Sepsis Labs: '@LABRCNTIP'$ (procalcitonin:4,lacticidven:4) Microbiology: Recent Results (from the past 240 hour(s))  Resp panel by RT-PCR (RSV, Flu A&B, Covid) Anterior Nasal Swab     Status: None   Collection Time: 02/06/23 12:21 PM   Specimen: Anterior Nasal Swab  Result Value Ref Range Status   SARS Coronavirus 2 by RT PCR NEGATIVE NEGATIVE Final    Comment: (NOTE) SARS-CoV-2 target nucleic acids are NOT DETECTED.  The SARS-CoV-2 RNA is generally detectable in upper respiratory specimens during the acute phase of infection. The lowest concentration of SARS-CoV-2 viral copies this assay can detect is 138 copies/mL. A negative result does not preclude SARS-Cov-2 infection and should not be used as the sole basis for treatment or other patient management decisions. A negative result may occur with  improper specimen collection/handling, submission of specimen other than nasopharyngeal swab, presence of viral mutation(s) within the areas targeted by this assay, and inadequate number of viral copies(<138 copies/mL). A negative result must be combined with clinical observations, patient history, and epidemiological information. The expected result is Negative.  Fact Sheet for Patients:  EntrepreneurPulse.com.au  Fact Sheet for Healthcare Providers:   IncredibleEmployment.be  This test is no t yet approved or cleared by the [place] FDA and  has been authorized for detection and/or diagnosis of SARS-CoV-2 by FDA under an Emergency Use Authorization (EUA). This EUA will remain  in effect (meaning this test can be used) for the duration of the COVID-19 declaration under Section 564(b)(1) of the Act, 21 U.S.C.section 360bbb-3(b)(1), unless the authorization is terminated  or revoked sooner.  Influenza A by PCR NEGATIVE NEGATIVE Final   Influenza B by PCR NEGATIVE NEGATIVE Final    Comment: (NOTE) The Xpert Xpress SARS-CoV-2/FLU/RSV plus assay is intended as an aid in the diagnosis of influenza from Nasopharyngeal swab specimens and should not be used as a sole basis for treatment. Nasal washings and aspirates are unacceptable for Xpert Xpress SARS-CoV-2/FLU/RSV testing.  Fact Sheet for Patients: EntrepreneurPulse.com.au  Fact Sheet for Healthcare Providers: IncredibleEmployment.be  This test is not yet approved or cleared by the [place] FDA and has been authorized for detection and/or diagnosis of SARS-CoV-2 by FDA under an Emergency Use Authorization (EUA). This EUA will remain in effect (meaning this test can be used) for the duration of the COVID-19 declaration under Section 564(b)(1) of the Act, 21 U.S.C. section 360bbb-3(b)(1), unless the authorization is terminated or revoked.     Resp Syncytial Virus by PCR NEGATIVE NEGATIVE Final    Comment: (NOTE) Fact Sheet for Patients: EntrepreneurPulse.com.au  Fact Sheet for Healthcare Providers: IncredibleEmployment.be  This test is not yet approved or cleared by the [place] FDA and has been authorized for detection and/or diagnosis of SARS-CoV-2 by FDA under an Emergency Use Authorization (EUA). This EUA will remain in effect (meaning this test can be used) for  the duration of the COVID-19 declaration under Section 564(b)(1) of the Act, 21 U.S.C. section 360bbb-3(b)(1), unless the authorization is terminated or revoked.  Performed at [facility], [address], [place], [place] [address]       Radiology Studies last 3 days: [facility] Chest [facility] 1 View  Result Date: 02/11/2023 CLINICAL DATA:  Shortness of breath. EXAM: PORTABLE CHEST 1 VIEW COMPARISON:  02/10/2023. FINDINGS: 0733 hours. Increased, now large left pleural effusion with near complete opacification of the left hemithorax. Unchanged pulmonary edema and small right pleural effusion. Left heart border is obscured. No pneumothorax. IMPRESSION: Increased, now large left pleural effusion with near-complete opacification of the left hemithorax. Unchanged pulmonary edema and small right pleural effusion. Electronically Signed   By: Emmit Alexanders M.D.   On: 02/11/2023 08:28   DG Chest Port 1 View  Result Date: 02/10/2023 CLINICAL DATA:  Shortness of breath and cough EXAM: PORTABLE CHEST 1 VIEW COMPARISON:  02/06/2023, 01/04/2022, CT 11/23/2017 FINDINGS: Post sternotomy changes. Small moderate bilateral pleural effusion, appears slightly increased on the left side. Chronic elevation of left diaphragm. Cardiomegaly with vascular congestion and pulmonary edema. Bibasilar consolidations. Aortic atherosclerosis. No pneumothorax. Left apical calcified nodules consistent with granuloma. IMPRESSION: Cardiomegaly with vascular congestion and pulmonary edema. Small moderate bilateral pleural effusions, slightly increased on the left side. Bibasilar consolidations may be due to atelectasis or pneumonia. Electronically Signed   By: Donavan Foil M.D.   On: 02/10/2023 17:52   ECHOCARDIOGRAM COMPLETE  Result Date: 02/09/2023    ECHOCARDIOGRAM REPORT   Patient Name:   Perman Yahola Date of Exam: 02/09/2023 Medical Rec #:  [id number]   Height:       67.0 in Accession #:    [id number]  Weight:       134.9 lb  Date of Birth:  [birth year]   BSA:          57.711 m Patient Age:    16 years    BP:           109/56 mmHg Patient Gender: M           HR:           85 bpm. Exam Location:  [facility]  Procedure: 2D Echo, Color Doppler, Cardiac Doppler and Intracardiac            Opacification Agent Indications:     Acute Diastolic CHF  History:         Patient has prior history of Echocardiogram examinations.                  Previous Myocardial Infarction, Prior CABG; Risk                  Factors:Hypertension and Diabetes.  Sonographer:     L. Thornton-Maynard Referring Phys:  [place] Phys: Ida Rogue MD IMPRESSIONS  1. Left ventricular ejection fraction, by estimation, is 35 to 40%. Left ventricular ejection fraction by 2D MOD biplane is 45.9 %. The left ventricle has moderately decreased function. The left ventricle demonstrates regional wall motion abnormalities (severe hypokinesis of the inferior/posterior wall, akinesis of the apical and peri-apical region). No apical thrombus noted. There is moderate asymmetric left ventricular hypertrophy of the basal-septal segment. Left ventricular diastolic parameters are  consistent with Grade II diastolic dysfunction (pseudonormalization).  2. Right ventricular systolic function is mildly reduced. The right ventricular size is moderately enlarged. There is normal pulmonary artery systolic pressure. The estimated right ventricular systolic pressure is 123456 mmHg.  3. The mitral valve is normal in structure. Mild to moderate mitral valve regurgitation. No evidence of mitral stenosis.  4. Tricuspid valve regurgitation is moderate.  5. The aortic valve has an indeterminant number of cusps. There is mild calcification of the aortic valve. Aortic valve regurgitation is not visualized. Aortic valve sclerosis is present, with no evidence of aortic valve stenosis.  6. The inferior vena cava is normal in size with greater than 50% respiratory variability, suggesting right  atrial pressure of 3 mmHg. FINDINGS  Left Ventricle: Left ventricular ejection fraction, by estimation, is 35 to 40%. Left ventricular ejection fraction by PLAX is 35 %. The left ventricle has moderately decreased function. The left ventricle demonstrates regional wall motion abnormalities. Definity contrast agent was given IV to delineate the left ventricular endocardial borders. The left ventricular internal cavity size was normal in size. There is moderate asymmetric left ventricular hypertrophy of the basal-septal segment. Left ventricular diastolic parameters are consistent with Grade II diastolic dysfunction (pseudonormalization). Right Ventricle: The right ventricular size is moderately enlarged. No increase in right ventricular wall thickness. Right ventricular systolic function is mildly reduced. There is normal pulmonary artery systolic pressure. The tricuspid regurgitant velocity is 2.64 m/s, and with an assumed right atrial pressure of 3 mmHg, the estimated right ventricular systolic pressure is 123456 mmHg. Left Atrium: Left atrial size was normal in size. Right Atrium: Right atrial size was normal in size. Pericardium: There is no evidence of pericardial effusion. Mitral Valve: The mitral valve is normal in structure. There is moderate thickening of the mitral valve leaflet(s). There is moderate calcification of the mitral valve leaflet(s). Mild mitral annular calcification. Mild to moderate mitral valve regurgitation. No evidence of mitral valve stenosis. Tricuspid Valve: The tricuspid valve is normal in structure. Tricuspid valve regurgitation is moderate . No evidence of tricuspid stenosis. Aortic Valve: The aortic valve has an indeterminant number of cusps. There is mild calcification of the aortic valve. Aortic valve regurgitation is not visualized. Aortic valve sclerosis is present, with no evidence of aortic valve stenosis. Aortic valve  mean gradient measures 4.0 mmHg. Aortic valve peak gradient  measures 5.3 mmHg. Aortic valve area, by VTI measures 1.78 cm.  Pulmonic Valve: The pulmonic valve was normal in structure. Pulmonic valve regurgitation is mild. No evidence of pulmonic stenosis. Aorta: The aortic root is normal in size and structure. Venous: The inferior vena cava is normal in size with greater than 50% respiratory variability, suggesting right atrial pressure of 3 mmHg. IAS/Shunts: No atrial level shunt detected by color flow Doppler.  LEFT VENTRICLE PLAX 2D LV EF:         Left            Diastology                ventricular     LV e' medial:    4.13 cm/s                ejection        LV E/e' medial:  20.6                fraction by     LV e' lateral:   6.74 cm/s                PLAX is 35      LV E/e' lateral: 12.6                %. LVIDd:         4.20 cm LVIDs:         3.50 cm LV PW:         1.10 cm LV IVS:        1.70 cm LVOT diam:     2.10 cm LV SV:         41 LV SV Index:   24 LVOT Area:     3.46 cm  LV Volumes (MOD) LV vol d, MOD    111.0 ml A2C: LV vol d, MOD    78.9 ml A4C: LV vol s, MOD    64.9 ml A2C: LV vol s, MOD    36.8 ml A4C: LV SV MOD A2C:   46.1 ml LV SV MOD A4C:   78.9 ml LV SV MOD BP:    43.2 ml RIGHT VENTRICLE RV Basal diam:  3.80 cm RV S prime:     8.16 cm/s TAPSE (M-mode): 1.6 cm LEFT ATRIUM             Index        RIGHT ATRIUM           Index LA diam:        4.10 cm 2.40 cm/m   RA Area:     20.50 cm LA Vol (A2C):   65.6 ml 38.35 ml/m  RA Volume:   64.30 ml  37.59 ml/m LA Vol (A4C):   88.1 ml 51.50 ml/m LA Biplane Vol: 80.4 ml 47.00 ml/m  AORTIC VALVE                    PULMONIC VALVE AV Area (Vmax):    2.33 cm     PV Vmax:          0.83 m/s AV Area (Vmean):   1.92 cm     PV Peak grad:     2.8 mmHg AV Area (VTI):     1.78 cm     PR End Diast Vel: 3.80 msec AV Vmax:           115.50 cm/s AV Vmean:          87.100 cm/s AV VTI:  0.229 m AV Peak Grad:      5.3 mmHg AV Mean Grad:      4.0 mmHg LVOT Vmax:         77.60 cm/s LVOT Vmean:        48.200 cm/s LVOT  VTI:          0.118 m LVOT/AV VTI ratio: 0.52  AORTA Ao Root diam: 3.40 cm Ao Asc diam:  3.30 cm MITRAL VALVE               TRICUSPID VALVE MV Area (PHT): 4.39 cm    TR Peak grad:   27.9 mmHg MV Decel Time: 173 msec    TR Vmax:        264.00 cm/s MV E velocity: 84.90 cm/s MV A velocity: 82.80 cm/s  SHUNTS MV E/A ratio:  1.03        Systemic VTI:  0.12 m                            Systemic Diam: 2.10 cm Ida Rogue MD Electronically signed by Ida Rogue MD Signature Date/Time: 02/09/2023/12:45:04 PM    Final              LOS: 5 days     Emeterio Reeve, DO [facility] 02/11/2023, 1:05 PM    Dictation software may have been used to generate the above note. Typos may occur and escape review in typed/dictated notes. Please contact Dr Sheppard Coil directly for clarity if needed.  Staff may message me via secure chat in [place]  but this may not receive an immediate response,  please page me for urgent matters!  If 7PM-7AM, please contact night coverage [email or web address]

## 2023-02-11 NOTE — Progress Notes (Signed)
Rounding Note    Patient Name: Peter Becker Date of Encounter: 02/11/2023  [place] Cardiologist: None   Subjective   Up sitting in recliner.  Denies chest pain.  Feels like his breathing is improving, though not at baseline.  Remains on supplemental oxygen at 4 L via nasal cannula.  No significant orthopnea.  Documented urine output 1.1 L for the past 24 hours with a net -6.9 L for the admission.  Renal function stable.  Chest x-ray this morning with increased, now large left pleural effusion with near complete opacification of the left hemithorax with unchanged pulmonary edema and a small right pleural effusion.  Thoracentesis has been ordered by primary service.  Inpatient Medications    Scheduled Meds:  aspirin EC  81 mg Oral Daily   atorvastatin  40 mg Oral Daily   carvedilol  3.125 mg Oral BID WC   furosemide  40 mg Oral Daily   losartan  12.5 mg Oral Daily   tamsulosin  0.4 mg Oral Daily   Continuous Infusions:  PRN Meds: acetaminophen, albuterol, dextromethorphan-guaiFENesin, diphenhydrAMINE, hydrALAZINE, LORazepam, nitroGLYCERIN   Vital Signs    Vitals:   02/10/23 2008 02/11/23 0007 02/11/23 0320 02/11/23 0804  BP: 113/63 122/68 (!) 114/58 129/71  Pulse: 72 79 81 86  Resp: '20 20 20 20  '$ Temp: 98 F (36.7 C) 97.9 F (36.6 C) 98 F (36.7 C) 97.7 F (36.5 C)  TempSrc:    Oral  SpO2: 94% 92% 91% 91%  Weight:      Height:        Intake/Output Summary (Last 24 hours) at 02/11/2023 0944 Last data filed at 02/11/2023 0311 Gross per 24 hour  Intake 460 ml  Output 1850 ml  Net -1390 ml       02/10/2023    8:40 AM 02/09/2023    5:00 AM 02/07/2023    2:35 PM  Last 3 Weights  Weight (lbs) 130 lb 1.1 oz 134 lb 14.7 oz 134 lb 7.7 oz  Weight (kg) 59 kg 61.2 kg 61 kg      Telemetry    Currently not on telemetry - Personally Reviewed  ECG    No new tracings - Personally Reviewed  Physical Exam   GEN: No acute distress.   Neck: No  JVD Cardiac: RRR, no murmurs, rubs, or gallops.  Respiratory: Rhonchi along the bilateral upper and mid lobes with diminished breath sounds along the bilateral bases with the left worse than the right.  Respirations are unlabored at rest on 4 L of O2 via nasal cannula GI: Soft, nontender, non-distended  MS: No edema; No deformity. Neuro:  Nonfocal  Psych: Normal affect   Labs    High Sensitivity Troponin:   Recent Labs  Lab 02/06/23 1221 02/06/23 1610  TROPONINIHS 205* 173*      Chemistry Recent Labs  Lab 02/07/23 0440 02/08/23 0352 02/09/23 0428 02/10/23 0425 02/11/23 0440  NA 143   < > 137 132* 136  K 3.9   < > 4.0 4.3 4.3  CL 105   < > 98 96* 95*  CO2 30   < > 32 33* 34*  GLUCOSE 47*   < > 69* 171* 191*  BUN 31*   < > 36* 39* 37*  CREATININE 1.28*   < > 1.48* 1.53* 1.39*  CALCIUM 8.3*   < > 8.1* 7.8* 8.2*  MG 2.0  --   --   --   --  GFRNONAA 53*   < > 44* 42* 48*  ANIONGAP 8   < > 7 3* 7   < > = values in this interval not displayed.     Lipids  Recent Labs  Lab 02/07/23 0440  CHOL 126  TRIG 49  HDL 77  LDLCALC 39  CHOLHDL 1.6     Hematology Recent Labs  Lab 02/08/23 0352 02/09/23 0428 02/11/23 0440  WBC 9.4 6.7 6.3  RBC 3.65* 3.66* 3.73*  HGB 11.5* 11.5* 11.7*  HCT 36.3* 35.6* 37.1*  MCV 99.5 97.3 99.5  MCH 31.5 31.4 31.4  MCHC 31.7 32.3 31.5  RDW 12.8 12.6 12.4  PLT 182 187 184    Thyroid No results for input(s): "TSH", "FREET4" in the last 168 hours.  BNP Recent Labs  Lab 02/06/23 1221  BNP 1,008.1*     DDimer No results for input(s): "DDIMER" in the last 168 hours.   Radiology    CXR 02/11/2023: IMPRESSION: Increased, now large left pleural effusion with near-complete opacification of the left hemithorax. Unchanged pulmonary edema and small right pleural effusion.  Cardiac Studies  TTE 02/09/23 1. Left ventricular ejection fraction, by estimation, is 35 to 40%. Left  ventricular ejection fraction by 2D MOD biplane is 45.9  %. The left  ventricle has moderately decreased function. The left ventricle  demonstrates regional wall motion abnormalities  (severe hypokinesis of the inferior/posterior wall, akinesis of the apical  and peri-apical region). No apical thrombus noted. There is moderate  asymmetric left ventricular hypertrophy of the basal-septal segment. Left  ventricular diastolic parameters are   consistent with Grade II diastolic dysfunction (pseudonormalization).   2. Right ventricular systolic function is mildly reduced. The right  ventricular size is moderately enlarged. There is normal pulmonary artery  systolic pressure. The estimated right ventricular systolic pressure is  123456 mmHg.   3. The mitral valve is normal in structure. Mild to moderate mitral valve  regurgitation. No evidence of mitral stenosis.   4. Tricuspid valve regurgitation is moderate.   5. The aortic valve has an indeterminant number of cusps. There is mild  calcification of the aortic valve. Aortic valve regurgitation is not  visualized. Aortic valve sclerosis is present, with no evidence of aortic  valve stenosis.   6. The inferior vena cava is normal in size with greater than 50%  respiratory variability, suggesting right atrial pressure of 3 mmHg.    Patient Profile     89 y.o. male with a past medical history of HFrEF, hypertension, hyperlipidemia, type 2 diabetes, coronary artery disease status post CABG x 3 vessel (2007), peripheral vascular disease, CVA, pernicious anemia, anxiety, CKD stage IIIa, who has been seen and evaluated for shortness of breath and elevated high-sensitivity troponin.  Assessment & Plan    Elevated high-sensitivity troponin with a history of CAD status post CABG -Patient remains chest pain-free -High-sensitivity troponins trended flat -Patient was maintained on IV heparin for 48 hours and then discontinued -He has been continued on aspirin and statin therapy -EKG for pain or changes as  needed -No current plans for invasive testing at this time  Acute on chronic HFrEF with bilateral pleural effusions -LVEF 35-40%, G2 DD, mild to moderate MR, moderate TR -Remains on supplemental oxygen at 4 L via nasal cannula -Chest x-ray this morning with increased now large left pleural effusion -Thoracentesis pending -Heart failure education -Daily weights, I's and O's, low-sodium diet -He has been transition from IV furosemide to oral -Add low-dose losartan -Continue  low-dose carvedilol -Unlikely a candidate for MRA or SGLT2 inhibitor secondary to relative hypotension, renal dysfunction, and advanced age -Continue to escalate GDMT as tolerated by blood pressure and kidney function  Essential hypertension with previous bouts of hypotension -Blood pressure stable -Adding low-dose losartan with continuation of carvedilol as outlined above  Hyperlipidemia -LDL 39 -Continue on atorvastatin 40 mg daily  History of ischemic stroke -Continued on aspirin and statin -No new deficits  CKD stage IIIa -Serum creatinine stable -Monitor urine output -Monitor/trend/replete electrolytes as needed -Avoid nephrotoxic agents were able  Type 2 diabetes -Continued on insulin therapy -Management per IM     For questions or updates, please contact [place] Please consult [email or web address] for contact info under        Signed, Christell Faith, PA-C  02/11/2023, 9:44 AM

## 2023-02-11 NOTE — Evaluation (Addendum)
Clinical/Bedside Swallow Evaluation Patient Details  Name: Peter Becker MRN: [id number] Date of Birth: 16-Jun-1929  Today's Date: 02/11/2023 Time: SLP Start Time (ACUTE ONLY): 1045 SLP Stop Time (ACUTE ONLY): 1145 SLP Time Calculation (min) (ACUTE ONLY): 60 min  Past Medical History:  Past Medical History:  Diagnosis Date   Diabetes mellitus without complication ([place])    Hypertension    Myocardial infarct [place] [facility])    Past Surgical History:  Past Surgical History:  Procedure Laterality Date   CARPAL TUNNEL RELEASE Left    CHOLECYSTECTOMY     EYE SURGERY     heart stent     triple bypass   HEMORRHOID SURGERY     TONSILLECTOMY     HPI:  Pt is a 89 y.o. male with medical history significant of sCHF with EF 35-40%, HTN, HLD, DM, CAD, CABG, PVD, stroke, anxiety, CKD-3a, who presents to ED from home on 02/06/2023 with SOB x1 month not responsive to outpatient tx.  Cardiology is following w/ scheduled thoracentesis 02/11/23 per NSG.  Last MRI in 2018: Chronic microvascular ischemia and old right corona radiata  lacunar infarct.    CXR currently:  large left pleural effusion with near-complete  opacification of the left hemithorax. Unchanged pulmonary edema and  small right pleural effusion.    Assessment / Plan / Recommendation  Clinical Impression   Pt seen today for BSE. Pt awake, verbal giving details of his hospitalization in 2018 and his recovery. Daughter present. Pt followed instructions w/ gentle cues. Appeared weak overall. Pt has a Baseline of hypophonia and mild dysarthria per chart notes. Fully intelligible during resonses.  Afebrile; WBV WNL. Pt on 4L [place].   Pt appears to present w/ grossly functional oropharyngeal phase swallow though is at min increased risk for dysphagia, aspiration in setting of advanced age and acute illness/hospitalization. Pt consumed po trials w/ No immediate, overt clinical s/s of aspiration during po trials; however pt exhibited subtle sign of throat  clearing b/t trials x2-3 - this, and his Pulmonary status, should be monitored moving forward. Pt appears at reduced risk for aspiration following general aspiration precautions and using a modified diet of cooked, soft foods for ease of mastication/intake overall d/t loose Denture plate.  Pt does have challenging factors that could impact oropharyngeal swallowing to include advanced age, fatigue/weakness, and pulmonary decline. These factors can increase risk for aspiration, dysphagia as well as decreased oral intake overall.  During po trials, pt consumed all consistencies w/ No immediate, overt coughing, decline in vocal quality, or change in respiratory presentation during/post trials. O2 sats remained 97%. Mild throat clearing x2-3 occurred but not immediate to the swallow. Pt exhibited a hacking, congested cough prior to po's given as well. This throat clearing did not increase in frequency nor intensity. Oral phase appeared grossly [facility] w/ timely bolus management, mastication, and control of bolus propulsion for A-P transfer for swallowing. Oral clearing achieved w/ all trial consistencies -- moistened, soft foods given.  OM Exam appeared [facility] w/ no gross unilateral weakness noted; posterior lingual strength WFL. Speech low in intensity w/ slight dysarthria but fully intelligible. Pt fed self w/ setup support.   Recommend a more Mech Soft consistency diet w/ well-Cut meats, moistened foods; Thin liquids via Cup -- carefully monitor cup sipping for single, small sips. No Straws. Recommend general aspiration precautions, tray setup and support at meals as needed. Pills WHOLE in Puree for safer, easier swallowing is recommended.  Education given on Pills in Puree; food consistencies  and easy to eat options; general aspiration precautions to pt and Dtr in room. NSG updated, agreed. MD updated. Recommend Dietician f/u for support. ST services will continue to monitor pt's status w/ objective assessment as  indicated next 1-2 days.  SLP Visit Diagnosis: Dysphagia, unspecified (R13.10) (reduced cardiopulmonary status)    Aspiration Risk  Mild aspiration risk;Risk for inadequate nutrition/hydration    Diet Recommendation   a more Mech Soft consistency diet w/ well-Cut meats, moistened foods; Thin liquids -- carefully monitor cup sipping for single, small sips. No Straws. Recommend general aspiration precautions, tray setup and support at meals as needed.   Medication Administration: Whole meds with puree (vs Crushed for safer swallowing)    Other  Recommendations Recommended Consults:  (Dietician f/u as needed) Oral Care Recommendations: Oral care BID;Oral care before and after PO;Staff/trained caregiver to provide oral care (Denture care)    Recommendations for follow up therapy are one component of a multi-disciplinary discharge planning process, led by the attending physician.  Recommendations may be updated based on patient status, additional functional criteria and insurance authorization.  Follow up Recommendations  (TBD)      Assistance Recommended at Discharge  full  Functional Status Assessment Patient has had a recent decline in their functional status and demonstrates the ability to make significant improvements in function in a reasonable and predictable amount of time.  Frequency and Duration min 2x/week  2 weeks       Prognosis Prognosis for improved oropharyngeal function: Fair Barriers to Reach Goals: Time post onset;Severity of deficits Barriers/Prognosis Comment: baseline deconditioned status      Swallow Study   General Date of Onset: 02/06/23 HPI: Pt is a 89 y.o. male with medical history significant of sCHF with EF 35-40%, HTN, HLD, DM, CAD, CABG, PVD, stroke, anxiety, CKD-3a, who presents to ED from home on 02/06/2023 with SOB x1 month not responsive to outpatient tx.  Cardiology is following w/ scheduled thoracentesis 02/11/23 per NSG.  Last MRI in 2018: Chronic  microvascular ischemia and old right corona radiata  lacunar infarct.   CXR currently:  large left pleural effusion with near-complete  opacification of the left hemithorax. Unchanged pulmonary edema and  small right pleural effusion. Type of Study: Bedside Swallow Evaluation Previous Swallow Assessment: 2018 Diet Prior to this Study: Regular;Thin liquids (Level 0) (at home currently) Temperature Spikes Noted: No (wbc 6.3) Respiratory Status: Nasal cannula (2-4L) History of Recent Intubation: No Behavior/Cognition: Alert;Cooperative;Pleasant mood;Distractible;Requires cueing Oral Cavity Assessment:  (sticky) Oral Care Completed by SLP: Yes Oral Cavity - Dentition: Dentures, top;Dentures, bottom (loose bottom denture plate Priscille Kluver used to secure) Vision: Functional for self-feeding Self-Feeding Abilities: Able to feed self;Needs assist;Needs set up Patient Positioning: Upright in bed (positioning support) Baseline Vocal Quality: Low vocal intensity Volitional Cough: Strong;Congested Volitional Swallow: Able to elicit    Oral/Motor/Sensory Function Overall Oral Motor/Sensory Function: Within functional limits (grossly - no overt unilateral weakness; appropriate posterior lingual strength)   Ice Chips Ice chips: Within functional limits Presentation: Spoon (fed; 2 trials)   Thin Liquid Thin Liquid: Impaired Presentation: Cup;Self Fed (10+ trials; few multiple sips) Oral Phase Impairments:  (adequate) Pharyngeal  Phase Impairments: Throat Clearing - Delayed (x2) Other Comments: did not occur immediately post swallowing but in between trials    Nectar Thick Nectar Thick Liquid: Not tested   Honey Thick Honey Thick Liquid: Not tested   Puree Puree: Within functional limits Presentation: Self Fed;Spoon (8 trials)   Solid     Solid:  Within functional limits (fair) Presentation: Self Fed;Spoon (4 trials) Other Comments: moistened well        Orinda Kenner, MS, CCC-SLP Speech  Language Pathologist Rehab Services; [place] [phone] (ascom) [place] Cordts 02/11/2023,1:37 PM

## 2023-02-12 ENCOUNTER — Inpatient Hospital Stay: Payer: Medicare Other

## 2023-02-12 LAB — GLUCOSE, CAPILLARY
Glucose-Capillary: 229 mg/dL — ABNORMAL HIGH (ref 70–99)
Glucose-Capillary: 281 mg/dL — ABNORMAL HIGH (ref 70–99)
Glucose-Capillary: 287 mg/dL — ABNORMAL HIGH (ref 70–99)
Glucose-Capillary: 353 mg/dL — ABNORMAL HIGH (ref 70–99)

## 2023-02-12 LAB — BASIC METABOLIC PANEL
Anion gap: 8 (ref 5–15)
BUN: 35 mg/dL — ABNORMAL HIGH (ref 8–23)
CO2: 32 mmol/L (ref 22–32)
Calcium: 8.2 mg/dL — ABNORMAL LOW (ref 8.9–10.3)
Chloride: 97 mmol/L — ABNORMAL LOW (ref 98–111)
Creatinine, Ser: 1.32 mg/dL — ABNORMAL HIGH (ref 0.61–1.24)
GFR, Estimated: 51 mL/min — ABNORMAL LOW (ref >60)
Glucose, Bld: 235 mg/dL — ABNORMAL HIGH (ref 70–99)
Potassium: 4.3 mmol/L (ref 3.5–5.1)
Sodium: 137 mmol/L (ref 135–145)

## 2023-02-12 LAB — CBC
HCT: 36.8 % — ABNORMAL LOW (ref 39.0–52.0)
Hemoglobin: 11.7 g/dL — ABNORMAL LOW (ref 13.0–17.0)
MCH: 31.4 pg (ref 26.0–34.0)
MCHC: 31.8 g/dL (ref 30.0–36.0)
MCV: 98.7 fL (ref 80.0–100.0)
Platelets: 210 10*3/uL (ref 150–400)
RBC: 3.73 MIL/uL — ABNORMAL LOW (ref 4.22–5.81)
RDW: 12.2 % (ref 11.5–15.5)
WBC: 5.9 10*3/uL (ref 4.0–10.5)
nRBC: 0 % (ref 0.0–0.2)

## 2023-02-12 LAB — PATHOLOGIST SMEAR REVIEW

## 2023-02-12 MED ORDER — FUROSEMIDE 10 MG/ML IJ SOLN
80.0000 mg | Freq: Once | INTRAMUSCULAR | Status: AC
  Administered 2023-02-12: 80 mg via INTRAVENOUS
  Filled 2023-02-12: qty 8

## 2023-02-12 NOTE — Progress Notes (Signed)
Speech Language Pathology Treatment: Dysphagia  Patient Details Name: Peter Becker MRN: [id number] DOB: 08-14-1929 Today's Date: 02/12/2023 Time: [id number] SLP Time Calculation (min) (ACUTE ONLY): 40 min  Assessment / Plan / Recommendation Clinical Impression  Pt seen today for ongoing assessment of toleration of diet. Pt awake, verbal, and sitting in chair beginning his lunch meal. Pt followed instructions w/ gentle cues. Appeared weak overall. Pt has a Baseline of hypophonia and mild dysarthria per chart notes in 2019. Fully intelligible during responses.  Afebrile; WBC WNL. Pt on RA.  Thoracentesis completed yesterday; pt stated he felt he was breathing "better" but still presents w/ a deep, congested cough PRIOR TO po's.   Pt appears to present w/ grossly functional oropharyngeal phase swallow though is at increased risk for dysphagia, aspiration in setting of advanced age and acute illness/hospitalization w/ declined cardiopulmonary status. Pt continues to exhibit no immediate, overt clinical s/s of aspiration during po trials; however he exhibited subtle signs of delayed throat clearing w/ cough b/t trials x3-4. Unsure if related to his declined cardiopulmonary status but these subtle s/s were noted fairly consistency during lengthy mastication of foods from meal; post swallow of thin liquids x1.  Pt has challenging factors that could impact oropharyngeal swallowing to include advanced age, fatigue/weakness, and quite loose/ill-fitting lower denture plate. These factors can increase risk for aspiration, dysphagia as well as decreased oral intake overall.  During po trials, pt consumed various consistencies w/ no immediate, overt coughing, decline in vocal quality, or change in respiratory presentation during/post trials. O2 sats remained 96-98%. Mild throat clearing and delayed cough noted x3-4 w/ foods; x1 w/ thin liquids via Cup. These subtle s/s were not immediate to the swallow. These subtle  s/s did not increase in frequency nor intensity as he continued to feed himself the lunch meal. Oral phase appeared grossly [facility] w/ timely bolus management and control of liquid and puree boluses; A-P transfer timely for swallowing. Lengthy mastication was noted w/ foods - pt stated his MD told him to "chew his foods until they were mush, then swallow them". Oral clearing achieved w/ all trial consistencies -- moistened, soft foods given. Pt fed self w/ setup support.    In setting of acute illness/hospitalization and pulmonary decline, along w/ concern for potential pharyngeal phase dysphagia w/ aspiration, recommend a MBSS to objectively assess pharyngeal swallowing further. Recommend continue a more Mech Soft consistency diet w/ well-Cut meats, moistened foods d/t loose lower Denture plate; Thin liquids via CUP -- carefully monitor cup sipping for single, small sips. No Straws. Recommend general aspiration precautions, tray setup and support at meals as needed. Pills WHOLE in Puree for safer, easier swallowing is recommended.  Education provided to pt on POC, MBSS tomorrow. Pt agreed. MD/NSG updated, agreed. Recommend Dietician f/u for support.       HPI HPI: Pt is a 89 y.o. male with medical history significant of sCHF with EF 35-40%, HTN, HLD, DM, CAD, CABG, PVD, stroke, anxiety, CKD-3a, who presents to ED from home on 02/06/2023 with SOB x1 month not responsive to outpatient tx.  Cardiology is following w/ scheduled thoracentesis 02/11/23 per NSG.  Last MRI in 2018: Chronic microvascular ischemia and old right corona radiata  lacunar infarct.   CXR currently:  large left pleural effusion with near-complete  opacification of the left hemithorax. Unchanged pulmonary edema and  small right pleural effusion.      SLP Plan  MBS;Continue with current plan of care  Recommendations for follow up therapy are one component of a multi-disciplinary discharge planning process, led by the attending  physician.  Recommendations may be updated based on patient status, additional functional criteria and insurance authorization.    Recommendations  Diet recommendations: Dysphagia 3 (mechanical soft);Thin liquid Liquids provided via: Cup;No straw Medication Administration: Whole meds with puree Supervision: Patient able to self feed;Intermittent supervision to cue for compensatory strategies Compensations: Minimize environmental distractions;Slow rate;Small sips/bites;Lingual sweep for clearance of pocketing;Multiple dry swallows after each bite/sip;Follow solids with liquid Postural Changes and/or Swallow Maneuvers: Out of bed for meals;Seated upright 90 degrees;Upright 30-60 min after meal                General recommendations:  (Palliative Care; Dietician) Oral Care Recommendations: Oral care BID;Oral care before and after PO;Staff/trained caregiver to provide oral care (Denture care) Follow Up Recommendations:  (TBD) Assistance recommended at discharge: Intermittent Supervision/Assistance SLP Visit Diagnosis: Dysphagia, oropharyngeal phase (R13.12) (ill-fitting denture) Plan: MBS;Continue with current plan of care             Orinda Kenner, [place], [place]; [place] [phone] (ascom) Hasset Chaviano  02/12/2023, 5:04 PM

## 2023-02-12 NOTE — Progress Notes (Signed)
Occupational Therapy Treatment Patient Details Name: Peter Becker MRN: [id number] DOB: 1929/01/06 Today's Date: 02/12/2023   History of present illness Pt admitted for acute/chronic CHF with complaints of SOB symptoms. HIstory includes CHF, HTN, HLD, DM, CAD, CVA, anxiety, and CKD.   OT comments  Mr Locher was seen for OT treatment on this date. Upon arrival to room pt reclined in bed, agreeable to tx. Pt requires CGA toilet t/f, noted furniture walking, improved to SBA + RW ~30 ft funcitonal mobility. MIN A pericare, assist for thoroughness. SUPERVISION + MIN cues hand washing standing sink side, cues for soap use. SpO2 93% on RA at rest, desat 86% with mobility, resolved to 90% on 1L [place]. Pt making good progress toward goals, will continue to follow POC. Discharge recommendation remains appropriate.     Recommendations for follow up therapy are one component of a multi-disciplinary discharge planning process, led by the attending physician.  Recommendations may be updated based on patient status, additional functional criteria and insurance authorization.    Follow Up Recommendations  Home health OT     Assistance Recommended at Discharge Set up Supervision/Assistance  Patient can return home with the following  A little help with walking and/or transfers;A little help with bathing/dressing/bathroom;Help with stairs or ramp for entrance   Equipment Recommendations  BSC/3in1    Recommendations for Other Services      Precautions / Restrictions Precautions Precautions: Fall Restrictions Weight Bearing Restrictions: No       Mobility Bed Mobility Overal bed mobility: Modified Independent                  Transfers Overall transfer level: Needs assistance Equipment used: Rolling walker (2 wheels) Transfers: Sit to/from Stand Sit to Stand: Supervision                 Balance Overall balance assessment: Needs assistance Sitting-balance support: Feet  supported Sitting balance-Leahy Scale: Normal     Standing balance support: No upper extremity supported, During functional activity Standing balance-Leahy Scale: Good                             ADL either performed or assessed with clinical judgement   ADL Overall ADL's : Needs assistance/impaired                                       General ADL Comments: CGA toilet t/f, noted furniture walking, improved to SBA + RW ~30 ft funcitonal mobility. MIN A pericare,a ssist for thoroughness. SUPERVISION + MIN cues hand washing standing sink side, cues for soap use. SETUP don/doff gowns seated      Cognition Arousal/Alertness: Awake/alert Behavior During Therapy: WFL for tasks assessed/performed, Flat affect Overall Cognitive Status: Within Functional Limits for tasks assessed                                 General Comments: limited insight into bowel/bladder needs              General Comments SpO2 93% on RA at rest, desat 86% with mobility, resolved to 90% on 1L [place]    Pertinent Vitals/ Pain       Pain Assessment Pain Assessment: No/denies pain   Frequency  Min 2X/week  Progress Toward Goals  OT Goals(current goals can now be found in the care plan section)  Progress towards OT goals: Progressing toward goals  Acute Rehab OT Goals Patient Stated Goal: to go home OT Goal Formulation: With patient/family Time For Goal Achievement: 02/24/23 Potential to Achieve Goals: Good ADL Goals Pt Will Perform Grooming: Independently;standing Pt Will Perform Lower Body Dressing: with modified independence;sit to/from stand Pt Will Transfer to Toilet: ambulating;regular height toilet;with modified independence  Plan Discharge plan remains appropriate;Frequency remains appropriate    Co-evaluation                 AM-PAC OT "6 Clicks" Daily Activity     Outcome Measure   Help from another person eating meals?:  None Help from another person taking care of personal grooming?: A Little Help from another person toileting, which includes using toliet, bedpan, or urinal?: A Little Help from another person bathing (including washing, rinsing, drying)?: A Little Help from another person to put on and taking off regular upper body clothing?: None Help from another person to put on and taking off regular lower body clothing?: A Little 6 Click Score: 20    End of Session    OT Visit Diagnosis: Other abnormalities of gait and mobility (R26.89);Muscle weakness (generalized) (M62.81)   Activity Tolerance Patient tolerated treatment well   Patient Left in bed;with call bell/phone within reach;with family/visitor present   Nurse Communication          Time: [id number] OT Time Calculation (min): 25 min  Charges: OT General Charges $OT Visit: 1 Visit OT Treatments $Self Care/Home Management : 23-37 mins  Dessie Coma, M.S. OTR/L  02/12/23, 11:06 AM  ascom [phone]

## 2023-02-12 NOTE — TOC Progression Note (Signed)
Transition of Care [facility]) - Progression Note    Patient Details  Name: Peter Becker MRN: [id number] Date of Birth: 07-Jul-1929  [facility]) CM/SW Contact  Laurena Slimmer, RN Phone Number: 02/12/2023, 4:19 PM  Clinical Narrative:    RW requested sent to [facility]   Expected Discharge Plan: Home/Self Care Barriers to Discharge: Continued Medical Work up  Expected Discharge Plan and Services       Living arrangements for the past 2 months: Single Family Home                                       Social Determinants of Health (SDOH) Interventions SDOH Screenings   Food Insecurity: No Food Insecurity (02/07/2023)  Housing: Low Risk  (02/07/2023)  Transportation Needs: No Transportation Needs (02/07/2023)  Utilities: Not At Risk (02/07/2023)  Tobacco Use: Medium Risk (02/07/2023)    Readmission Risk Interventions     No data to display

## 2023-02-12 NOTE — Progress Notes (Addendum)
Rounding Note    Patient Name: Peter Becker Date of Encounter: 02/12/2023  [place] Cardiologist: None   Subjective   He underwent thoracentesis yesterday with 650 cc of clear amber fluid removed. Supplemental oxygen requirement has been decreased from 4 L to 2 L. Renal function stable. Dyspnea improved. No chest pain.   Inpatient Medications    Scheduled Meds:  aspirin EC  81 mg Oral Daily   atorvastatin  40 mg Oral Daily   carvedilol  3.125 mg Oral BID WC   enoxaparin (LOVENOX) injection  30 mg Subcutaneous Daily   furosemide  40 mg Oral Daily   losartan  12.5 mg Oral Daily   tamsulosin  0.4 mg Oral Daily   Continuous Infusions:  PRN Meds: acetaminophen, albuterol, dextromethorphan-guaiFENesin, diphenhydrAMINE, hydrALAZINE, LORazepam, nitroGLYCERIN   Vital Signs    Vitals:   02/11/23 2046 02/11/23 2353 02/12/23 0426 02/12/23 0801  BP: 110/61 115/64 118/65 116/66  Pulse: 88 92 89 89  Resp: 20 (!) '24 20 20  '$ Temp: 98.8 F (37.1 C) 97.8 F (36.6 C) 98.6 F (37 C) 98.4 F (36.9 C)  TempSrc: Oral Oral  Oral  SpO2: 91% (!) 87% 92% 93%  Weight:      Height:        Intake/Output Summary (Last 24 hours) at 02/12/2023 0820 Last data filed at 02/12/2023 0448 Gross per 24 hour  Intake 0 ml  Output 300 ml  Net -300 ml       02/11/2023    8:04 AM 02/10/2023    8:40 AM 02/09/2023    5:00 AM  Last 3 Weights  Weight (lbs) 127 lb 11.2 oz 130 lb 1.1 oz 134 lb 14.7 oz  Weight (kg) 57.924 kg 59 kg 61.2 kg      Telemetry    Currently not on telemetry - Personally Reviewed  ECG    No new tracings - Personally Reviewed  Physical Exam   GEN: No acute distress.   Neck: No JVD Cardiac: RRR, no murmurs, rubs, or gallops.  Respiratory: Rhonchi along the bilateral upper and mid lobes with improved breath sounds along the left base.  GI: Soft, nontender, non-distended  MS: No edema; No deformity. Neuro:  Nonfocal  Psych: Normal affect   Labs    High  Sensitivity Troponin:   Recent Labs  Lab 02/06/23 1221 02/06/23 1610  TROPONINIHS 205* 173*      Chemistry Recent Labs  Lab 02/07/23 0440 02/08/23 0352 02/10/23 0425 02/11/23 0440 02/12/23 0505  NA 143   < > 132* 136 137  K 3.9   < > 4.3 4.3 4.3  CL 105   < > 96* 95* 97*  CO2 30   < > 33* 34* 32  GLUCOSE 47*   < > 171* 191* 235*  BUN 31*   < > 39* 37* 35*  CREATININE 1.28*   < > 1.53* 1.39* 1.32*  CALCIUM 8.3*   < > 7.8* 8.2* 8.2*  MG 2.0  --   --   --   --   GFRNONAA 53*   < > 42* 48* 51*  ANIONGAP 8   < > 3* 7 8   < > = values in this interval not displayed.     Lipids  Recent Labs  Lab 02/07/23 0440  CHOL 126  TRIG 49  HDL 77  LDLCALC 39  CHOLHDL 1.6     Hematology Recent Labs  Lab 02/09/23 0428 02/11/23 0440 02/12/23  0505  WBC 6.7 6.3 5.9  RBC 3.66* 3.73* 3.73*  HGB 11.5* 11.7* 11.7*  HCT 35.6* 37.1* 36.8*  MCV 97.3 99.5 98.7  MCH 31.4 31.4 31.4  MCHC 32.3 31.5 31.8  RDW 12.6 12.4 12.2  PLT 187 184 210    Thyroid No results for input(s): "TSH", "FREET4" in the last 168 hours.  BNP Recent Labs  Lab 02/06/23 1221  BNP 1,008.1*     DDimer No results for input(s): "DDIMER" in the last 168 hours.   Radiology    CXR 02/11/2023: IMPRESSION: Increased, now large left pleural effusion with near-complete opacification of the left hemithorax. Unchanged pulmonary edema and small right pleural effusion.  Cardiac Studies  TTE 02/09/23 1. Left ventricular ejection fraction, by estimation, is 35 to 40%. Left  ventricular ejection fraction by 2D MOD biplane is 45.9 %. The left  ventricle has moderately decreased function. The left ventricle  demonstrates regional wall motion abnormalities  (severe hypokinesis of the inferior/posterior wall, akinesis of the apical  and peri-apical region). No apical thrombus noted. There is moderate  asymmetric left ventricular hypertrophy of the basal-septal segment. Left  ventricular diastolic parameters are    consistent with Grade II diastolic dysfunction (pseudonormalization).   2. Right ventricular systolic function is mildly reduced. The right  ventricular size is moderately enlarged. There is normal pulmonary artery  systolic pressure. The estimated right ventricular systolic pressure is  123456 mmHg.   3. The mitral valve is normal in structure. Mild to moderate mitral valve  regurgitation. No evidence of mitral stenosis.   4. Tricuspid valve regurgitation is moderate.   5. The aortic valve has an indeterminant number of cusps. There is mild  calcification of the aortic valve. Aortic valve regurgitation is not  visualized. Aortic valve sclerosis is present, with no evidence of aortic  valve stenosis.   6. The inferior vena cava is normal in size with greater than 50%  respiratory variability, suggesting right atrial pressure of 3 mmHg.    Patient Profile     89 y.o. male with a past medical history of HFrEF, hypertension, hyperlipidemia, type 2 diabetes, coronary artery disease status post CABG x 3 vessel (2007), peripheral vascular disease, CVA, pernicious anemia, anxiety, CKD stage IIIa, who has been seen and evaluated for shortness of breath and elevated high-sensitivity troponin.  Assessment & Plan    Elevated high-sensitivity troponin with a history of CAD status post CABG -Patient remains chest pain-free -High-sensitivity troponins trended flat -Patient was maintained on IV heparin for 48 hours and then discontinued -He has been continued on aspirin and statin therapy -EKG for pain or changes as needed -No current plans for invasive testing at this time  Acute on chronic HFrEF with bilateral pleural effusions -LVEF 35-40%, G2 DD, mild to moderate MR, moderate TR -Dyspnea improved status post left-sided thoracentesis with cytology pending -Supplemental oxygen requirement decreased from 4 L to 2 L, continue to wean as tolerated  -Heart failure education -Daily weights, I's and  O's, low-sodium diet -Continue low-dose carvedilol, losartan, and oral Lasix -Unlikely a candidate for MRA or SGLT2 inhibitor secondary to relative hypotension, renal dysfunction, and advanced age -Continue to escalate GDMT as tolerated by blood pressure and kidney function  Essential hypertension with previous bouts of hypotension -Blood pressure stable -Medications as outlined above  Hyperlipidemia -LDL 39 -Continue on atorvastatin 40 mg daily  History of ischemic stroke -Continued on aspirin and statin -No new deficits  CKD stage IIIa -Serum creatinine stable -Monitor  urine output -Monitor/trend/replete electrolytes as needed -Avoid nephrotoxic agents were able  Type 2 diabetes -Continued on insulin therapy -Management per IM     For questions or updates, please contact [place] Please consult [email or web address] for contact info under        Signed, Christell Faith, PA-C  02/12/2023, 8:20 AM    Patient seen and examined with the above-signed Advanced Practice Provider and/or Housestaff. I personally reviewed laboratory data, imaging studies and relevant notes. I independently examined the patient and formulated the important aspects of the plan. I have edited the note to reflect any of my changes or salient points. I have personally discussed the plan with the patient and/or family.  Denies any CP or SOB. His daughter is at his bedside.   General:  Elderly, chronically-ill appearing male lying flat in bed  No resp difficulty HEENT: normal Neck: supple.JVP 9-10. Carotids 2+ bilat; no bruits. No lymphadenopathy or thryomegaly appreciated. Cor: PMI nondisplaced. Regular rate & rhythm. No rubs, gallops or murmurs. Lungs: clear Abdomen: soft, nontender, nondistended. No hepatosplenomegaly. No bruits or masses. Good bowel sounds. Extremities: no cyanosis, clubbing, rash, 1+ edema Neuro: alert & orientedx3, cranial nerves grossly intact. moves all 4 extremities w/o  difficulty. Affect pleasant  Symptoms seem improved after thoracentesis. Remains volume overloaded.   Will give lasix 80 IV today. Should be ready for d/c tomorrow. Given age and fraility would be careful not to push GDMT too hard. He has excellent home support but would consider outpatient palliative care services.   Glori Bickers, MD  4:38 PM

## 2023-02-12 NOTE — TOC Progression Note (Signed)
Transition of Care [facility]) - Progression Note    Patient Details  Name: Peter Becker MRN: [id number] Date of Birth: May 21, 1929  Transition of Care [facility]) CM/SW Contact  Laurena Slimmer, RN Phone Number: 02/12/2023, 3:19 PM  Clinical Narrative:    Spoke with patient's daughterf. She had concerns about patient going home vs to SNF. She was advised patient did not have a recommendationfrom therapy for SNF and they would have to private pay.  Dianna was advised SNF admission for incontinence would likely not be approved and patient would have to have a skilled need for a SNF. She inquired about personal  care services.  She was advised she could perform a Office manager for area [facility] providers.    Expected Discharge Plan: Home/Self Care Barriers to Discharge: Continued Medical Work up  Expected Discharge Plan and Services       Living arrangements for the past 2 months: Single Family Home                                       Social Determinants of Health (SDOH) Interventions SDOH Screenings   Food Insecurity: No Food Insecurity (02/07/2023)  Housing: Low Risk  (02/07/2023)  Transportation Needs: No Transportation Needs (02/07/2023)  Utilities: Not At Risk (02/07/2023)  Tobacco Use: Medium Risk (02/07/2023)    Readmission Risk Interventions     No data to display

## 2023-02-12 NOTE — Progress Notes (Signed)
PT Cancellation Note  Patient Details Name: NEHEMYAH WISE MRN: [id number] DOB: [birth year]   Cancelled Treatment:    Reason Eval/Treat Not Completed: Other (comment). Attempted OOB mobility this date. Pt reports he just returned back to bed and declined further mobility at this time. Will re-attempt another time.   Cynithia Hakimi 02/12/2023, 3:07 PM [name], PT, DPT, [facility] [phone]

## 2023-02-12 NOTE — Progress Notes (Signed)
Progress Note   Patient: Peter Becker [id number] DOB: July 17, 1929 DOA: 02/06/2023     6 DOS: the patient was seen and examined on 02/12/2023    Subjective:  Patient seen and examined bedside this morning Patient was seen with her daughter in the room She had an extensive list of questions which were all answered Patient is s/p thoracentesis done yesterday Repeat chest x-ray have been requested He is requiring 2 L of oxygen and does not use any home oxygen. Will probably need some oxygen at discharge hopefully tomorrow   Brief Narrative / Hospital Course:  Peter Becker is a 89 y.o. male with medical history significant of sCHF with EF 45%, HTN, HLD, DM, CAD, CABG, PVD, stroke, anxiety, CKD-3a, who presents to ED from home on 02/06/2023 with SOB x1 month not responsive to outpatient tx.  03/07: oxygen saturation 88% on room air which improved to 99% on 2 L oxygen. Trop  205,  BNP 1008, WBC 8.0, stable renal function,  Chest x-ray showed bilateral pleural effusion. Patient is admitted to telemetry bed as inpatient. Dr. Rockey Situ of cardiology was consulted. Started Lasix 40 mg IV bid, continued on IV heparin 03/08: Net IO Since Admission: -1,240 mL [02/07/23 0843]. Cardiology to see. Echo pending.  03/09: Net IO Since Admission: -3,683.42 mL [02/08/23 1858] echo still pending, cardiology saw pt today  03/10: Echo showing EF 35-40%, LV mod decreased fxn, severe hypokinests inf/post wall, akinesis apical area, G2DD, RV fxn mild reduced. Starting low dose carvedilol, work on weaning O2 vs home O2 03/11: no further ischemic eval from cardiology, consider resume losartan tomorrow. Later in afternoon RN noted choking on water, SOB requiring increase O2 again. CXR concern for pulmonary edema, infection possible but seems unlikely based on clinical picture. Will trial another IV lasix and follow repeat CXR in AM.        Consultants:  Cardiology - [facility]    Procedures: none           ASSESSMENT  & PLAN:   Principal Problem:   Acute on chronic systolic CHF (congestive heart failure) ([place]) Active Problems:   CAD (coronary artery disease)   Myocardial injury   Essential hypertension   Hyperlipidemia   Ischemic stroke ([place])   Type II diabetes mellitus with renal manifestations (HCC)   Chronic kidney disease, stage 3a (HCC)   Anxiety     Acute on chronic systolic CHF (congestive heart failure)  Acute hypoxic respiratory failure d/t HFrEF New recurrence hypoxia w/ concern for aspiration  2D echo on 02/06/19 16 showed EF of 45%.   03/10: Echo showing EF 35-40%, LV mod decreased fxn, severe hypokinests inf/post wall, akinesis apical area, G2DD, RV fxn mild reduced.  IV Lasix has been switched to p.o. 40 mg daily 2d echo as above  Daily weights, strict I/O's, Low salt diet, Fluid restriction Cardiology following Continue carvedilol   Pleural effusion likely d/t CHF Acute hypoxic respiratory failure d/t HFrEF and pleural effusion  S/p thoracentesis on 02/11/2023 Follow-up on chest x-ray postthoracentesis   CAD (coronary artery disease) w/ Hx CABG 2007 Myocardial injury w/ elevated troponin, downtrendign:  trop 205 --> 173, suspect non-ACS demand ischemia  IV heparin completed ASA and lipitor 40 mg daily   Essential hypertension IV hydralazine as needed Hold HCTZ since patient is on Lasix Cozaar d/c but may restart tomorrow per cardiology  Carvedilol started toda    Hyperlipidemia Lipitor   Ischemic stroke ([place]) Aspirin and Lipitor   Type II  diabetes mellitus with renal manifestations (HCC) Hyperglycemia Recent A1c 8.4, decently controlled overall.  Blood sugar 321.   hold home Actos, Amaryl Hypoglycemia, stopped insulin    Chronic kidney disease, stage 3a ([place]):  Stable Follow-up BMP especially on Lasix    Anxiety Rx as needed       DVT prophylaxis: lovenox Pertinent IV fluids/nutrition: no IV fluids, cardiac/carb diet  Central lines / invasive devices:  none   Code Status: DNR   Current Admission Status: inpatient   TOC needs / Dispo plan: anticipate d/c home , currently recs for [facility], daughter is asking about SNF and she has been educated on what TOC can and can not help with based on PT/OT recs and insurance  Barriers to discharge / significant pending items: thoracentesis today, wean O2      Family Communication: daughter at bedside on rounds        Physical Exam Constitutional:      General: He is not in acute distress.    Appearance: He is well-developed.  Cardiovascular:     Rate and Rhythm: Normal rate and regular rhythm.  Pulmonary:     Effort: No tachypnea.     Breath sounds: Decreased bilaterally at the bases Musculoskeletal:     Right lower leg: No edema.     Left lower leg: No edema.  Skin:    General: Skin is warm and dry.  Neurological:     General: No focal deficit present.     Mental Status: He is alert and oriented to person, place, and time.  Psychiatric:        Mood and Affect: Mood normal.        Behavior: Behavior normal.              Physical Exam: Vitals:   02/12/23 0426 02/12/23 0801 02/12/23 1146 02/12/23 1609  BP: 118/65 116/66 108/61 126/73  Pulse: 89 89 82 82  Resp: '20 20 18 20  '$ Temp: 98.6 F (37 C) 98.4 F (36.9 C) (!) 97.5 F (36.4 C) (!) 97.5 F (36.4 C)  TempSrc:  Oral Oral Oral  SpO2: 92% 93% 90% 95%  Weight:      Height:         Time spent: 35 minutes  Author: Verline Lema, MD 02/12/2023 4:50 PM  For on call review [email or web address].

## 2023-02-13 ENCOUNTER — Inpatient Hospital Stay: Payer: Medicare Other

## 2023-02-13 DIAGNOSIS — J90 Pleural effusion, not elsewhere classified: Secondary | ICD-10-CM

## 2023-02-13 DIAGNOSIS — I2489 Other forms of acute ischemic heart disease: Secondary | ICD-10-CM

## 2023-02-13 LAB — BASIC METABOLIC PANEL
Anion gap: 11 (ref 5–15)
BUN: 32 mg/dL — ABNORMAL HIGH (ref 8–23)
CO2: 36 mmol/L — ABNORMAL HIGH (ref 22–32)
Calcium: 8.4 mg/dL — ABNORMAL LOW (ref 8.9–10.3)
Chloride: 93 mmol/L — ABNORMAL LOW (ref 98–111)
Creatinine, Ser: 1.37 mg/dL — ABNORMAL HIGH (ref 0.61–1.24)
GFR, Estimated: 48 mL/min — ABNORMAL LOW (ref >60)
Glucose, Bld: 232 mg/dL — ABNORMAL HIGH (ref 70–99)
Potassium: 3.9 mmol/L (ref 3.5–5.1)
Sodium: 140 mmol/L (ref 135–145)

## 2023-02-13 LAB — GLUCOSE, CAPILLARY
Glucose-Capillary: 197 mg/dL — ABNORMAL HIGH (ref 70–99)
Glucose-Capillary: 253 mg/dL — ABNORMAL HIGH (ref 70–99)

## 2023-02-13 MED ORDER — FUROSEMIDE 40 MG PO TABS: 40.0000 mg | ORAL_TABLET | Freq: Every day | ORAL | 1 refills | Status: DC

## 2023-02-13 MED ORDER — ATORVASTATIN CALCIUM 40 MG PO TABS: 40.0000 mg | ORAL_TABLET | Freq: Every day | ORAL | 1 refills | Status: DC

## 2023-02-13 MED ORDER — LOSARTAN POTASSIUM 25 MG PO TABS: 12.5000 mg | ORAL_TABLET | Freq: Every day | ORAL | 0 refills | Status: DC

## 2023-02-13 MED ORDER — FUROSEMIDE 10 MG/ML IJ SOLN
80.0000 mg | Freq: Once | INTRAMUSCULAR | Status: AC
  Administered 2023-02-13: 80 mg via INTRAVENOUS
  Filled 2023-02-13: qty 8

## 2023-02-13 MED ORDER — CARVEDILOL 3.125 MG PO TABS: 3.1250 mg | ORAL_TABLET | Freq: Two times a day (BID) | ORAL | 1 refills | Status: DC

## 2023-02-13 MED ORDER — ASPIRIN 81 MG PO TBEC: 81.0000 mg | DELAYED_RELEASE_TABLET | Freq: Every day | ORAL | 12 refills | Status: DC

## 2023-02-13 MED ORDER — ACETAMINOPHEN 325 MG PO TABS: 650.0000 mg | ORAL_TABLET | Freq: Four times a day (QID) | ORAL | 0 refills | Status: DC | PRN

## 2023-02-13 NOTE — Care Management Important Message (Signed)
Important Message  Patient Details  Name: RASHONE STAMPLEY MRN: [id number] Date of Birth: 1929/04/22   Medicare Important Message Given:  Yes     Dannette Barbara 02/13/2023, 2:28 PM

## 2023-02-13 NOTE — Progress Notes (Signed)
Physical Therapy Treatment Patient Details Name: Peter Becker MRN: [id number] DOB: [birth year] Today's Date: 02/13/2023   History of Present Illness Pt admitted for acute/chronic CHF with complaints of SOB symptoms. HIstory includes CHF, HTN, HLD, DM, CAD, CVA, anxiety, and CKD.    PT Comments    Pt is making good progress towards goals with ability to ambulate in hallway this date. RW used with cues for safety especially during turns. All mobility performed on 3L with no SOB symptoms. Unable to obtain accurate pulse ox reading with exertion. Family at bedside. Will continue to progress as able.   Recommendations for follow up therapy are one component of a multi-disciplinary discharge planning process, led by the attending physician.  Recommendations may be updated based on patient status, additional functional criteria and insurance authorization.  Follow Up Recommendations  Home health PT     Assistance Recommended at Discharge Set up Supervision/Assistance  Patient can return home with the following A little help with walking and/or transfers;A little help with bathing/dressing/bathroom   Equipment Recommendations  Rolling walker (2 wheels)    Recommendations for Other Services       Precautions / Restrictions Precautions Precautions: Fall Restrictions Weight Bearing Restrictions: No     Mobility  Bed Mobility               General bed mobility comments: received in recliner pre/post session    Transfers Overall transfer level: Needs assistance Equipment used: Rolling walker (2 wheels) Transfers: Sit to/from Stand Sit to Stand: Modified independent (Device/Increase time)           General transfer comment: safe technique with no cues. Pushes with B hands on arms of chair    Ambulation/Gait Ambulation/Gait assistance: Min guard Gait Distance (Feet): 120 Feet Assistive device: Rolling walker (2 wheels) Gait Pattern/deviations: Step-through pattern        General Gait Details: ambulated using RW. All mobility performed on 3L of O2. Attempted for pulse ox reading, unable to obtain on either hand with exertion. Reports no SOB symptoms. Cues for keeping walker closer to body with turns.   Stairs             Wheelchair Mobility    Modified Rankin (Stroke Patients Only)       Balance Overall balance assessment: Needs assistance Sitting-balance support: Feet supported Sitting balance-Leahy Scale: Normal     Standing balance support: No upper extremity supported, During functional activity Standing balance-Leahy Scale: Good                              Cognition Arousal/Alertness: Awake/alert Behavior During Therapy: WFL for tasks assessed/performed, Flat affect Overall Cognitive Status: Within Functional Limits for tasks assessed                                          Exercises      General Comments General comments (skin integrity, edema, etc.): spo2 >90% on 2 L via [place]      Pertinent Vitals/Pain Pain Assessment Pain Assessment: No/denies pain    Home Living                          Prior Function            PT Goals (current goals can now be found  in the care plan section) Acute Rehab PT Goals Patient Stated Goal: to go home PT Goal Formulation: With patient Time For Goal Achievement: 02/24/23 Potential to Achieve Goals: Good Progress towards PT goals: Progressing toward goals    Frequency    Min 2X/week      PT Plan Current plan remains appropriate    Co-evaluation              AM-PAC PT "6 Clicks" Mobility   Outcome Measure  Help needed turning from your back to your side while in a flat bed without using bedrails?: A Little Help needed moving from lying on your back to sitting on the side of a flat bed without using bedrails?: A Little Help needed moving to and from a bed to a chair (including a wheelchair)?: A Little Help needed standing  up from a chair using your arms (e.g., wheelchair or bedside chair)?: A Little Help needed to walk in hospital room?: A Little Help needed climbing 3-5 steps with a railing? : A Lot 6 Click Score: 17    End of Session Equipment Utilized During Treatment: Gait belt;Oxygen Activity Tolerance: Patient tolerated treatment well Patient left: in chair;with chair alarm set Nurse Communication: Mobility status PT Visit Diagnosis: Unsteadiness on feet (R26.81);Muscle weakness (generalized) (M62.81);Difficulty in walking, not elsewhere classified (R26.2)     Time: [id number] PT Time Calculation (min) (ACUTE ONLY): 14 min  Charges:  $Gait Training: 8-22 mins                     [name], PT, DPT, GCS [phone]    [name] 02/13/2023, 11:51 AM

## 2023-02-13 NOTE — Progress Notes (Signed)
Modified Barium Swallow Study  Patient Details  Name: Peter Becker MRN: [id number] Date of Birth: 10-24-1929  Today's Date: 02/13/2023  HPI/PMH: HPI: Pt is a 89 y.o. male with medical history significant of sCHF with EF 35-40%, HTN, HLD, DM, CAD, CABG, PVD, stroke, anxiety, CKD-3a, who presents to ED from home on 02/06/2023 with SOB x1 month not responsive to outpatient tx.  Cardiology is following w/ scheduled thoracentesis 02/11/23 per NSG.  Last MRI in 2018: Chronic microvascular ischemia and old right corona radiata  lacunar infarct.   CXR currently:  large left pleural effusion with near-complete  opacification of the left hemithorax. Unchanged pulmonary edema and  small right pleural effusion.   Clinical Impression: Clinical Impression: Pt presents with a mild oropharyngeal dysphagia. Oral phase remarkable for prolonged mastication of solids, trace-mild oral residual across consistencies, and inconsistent disorganized lingual movement for A-P transit. Pharyngeal phase remarkable for swallow initiation at the level of the valleculae/pyriform sinus, before the swallow penetration of both nectar-thick and thin liquids via tsp, cup, and straw sip with subsequent silent aspiration of thin liquids via straw sip, trace-moderate pharyngeal stasis which increased with visocity. Compensations of cued/spontaneous secondary swallow, cued throat clearing, and use of liquid wash were inconsistently effective. Cued cough was not effective. Above mentioned deficits secondary to observed or inferred: lingual weakness/incoordination, reduced base of tongue retraction, mistimed laryngeal vestibule closure, reduced amplitude/duration of UES opening, and reduced laryngeal and pharyngeal sensation. Pt shown videofluorscopic images immeditely following MBSS for education. Pt verbalized understanding/agreement. RN and MD made aware of results, recommendations, and SLP POC. SLP to f/u per POC for diet tolerance.  Factors  that may increase risk of adverse event in presence of aspiration ([place] 2021): Factors that may increase risk of adverse event in presence of aspiration ([place] 2021): Frail or deconditioned   Recommendations/Plan: Swallowing Evaluation Recommendations Swallowing Evaluation Recommendations Recommendations: PO diet PO Diet Recommendation: Dysphagia 3 (Mechanical soft); Thin liquids (Level 0) Liquid Administration via: No straw Medication Administration: Crushed with puree Supervision: Patient able to self-feed; Full supervision/cueing for swallowing strategies (set up) Swallowing strategies  : Minimize environmental distractions; Slow rate; Small bites/sips; Follow solids with liquids; Clear throat intermittently Postural changes: Position pt fully upright for meals; Stay upright 30-60 min after meals; Out of bed for meals Oral care recommendations: Oral care QID (4x/day); Staff/trained caregiver to provide oral care (denture care)    Treatment Plan Treatment Plan Treatment recommendations: Therapy as outlined in treatment plan below Follow-up recommendations: Home health SLP Functional status assessment: Patient has had a recent decline in their functional status and demonstrates the ability to make significant improvements in function in a reasonable and predictable amount of time. Treatment frequency: Min 2x/week Treatment duration: 2 weeks Interventions: Aspiration precaution training; Trials of upgraded texture/liquids; Diet toleration management by SLP; Patient/family education; Compensatory techniques; Oropharyngeal exercises     Recommendations Recommendations for follow up therapy are one component of a multi-disciplinary discharge planning process, led by the attending physician.  Recommendations may be updated based on patient status, additional functional criteria and insurance authorization.  Assessment: Orofacial Exam: Orofacial Exam Oral Cavity:  Oral Hygiene: WFL Oral Cavity - Dentition: Dentures, top; Dentures, bottom (ill-fitting lower denture plate) Orofacial Anatomy: WFL Oral Motor/Sensory Function: Generalized oral weakness    Anatomy:  Anatomy: WFL   Thin Liquids: Thin Liquids (Level 0) Thin Liquids : Impaired Bolus delivery method: Spoon; Cup; Straw Thin Liquid - Impairment: Oral Impairment; Pharyngeal impairment Lip Closure:  Interlabial escape, no progression to anterior lip Tongue control during bolus hold: Cohesive bolus between tongue to palatal seal Bolus transport/lingual motion: Repetitive/disorganized tongue motion Oral residue: Trace residue lining oral structures Location of oral residue : Floor of mouth; Palate Initiation of swallow : Valleculae; Pyriform sinuses Soft palate elevation: No bolus between soft palate (SP)/pharyngeal wall (PW) Laryngeal elevation: Complete superior movement of thyroid cartilage with complete approximation of arytenoids to epiglottic petiole Anterior hyoid excursion: Complete Epiglottic movement: Complete Laryngeal vestibule closure: Incomplete, narrow column air/contrast in laryngeal vestibule Pharyngeal stripping wave : Present - diminished Pharyngeal contraction (A/P view only): N/A Pharyngoesophageal segment opening: Complete distension and complete duration, no obstruction of flow Tongue base retraction: Trace column of contrast or air between tongue base and PPW Pharyngeal residue: Trace residue within or on pharyngeal structures Location of pharyngeal residue: Valleculae Penetration/Aspiration Scale (PAS) score: 1.  Material does not enter airway; 2.  Material enters airway, remains ABOVE vocal cords then ejected out; 3.  Material enters airway, remains ABOVE vocal cords and not ejected out; 5.  Material enters airway, CONTACTS cords and not ejected out; 8.  Material enters airway, passes BELOW cords without attempt by patient to eject out (silent aspiration)      Mildly Thick Liquids: Mildly thick liquids (Level 2, nectar thick) Mildly thick liquids (Level 2, nectar thick): Impaired Bolus delivery method: Spoon Mildly Thick Liquid - Impairment: Oral Impairment; Pharyngeal impairment Lip Closure: Interlabial escape, no progression to anterior lip Tongue control during bolus hold: Not tested Bolus transport/lingual motion: Brisk tongue motion Oral residue: Trace residue lining oral structures Location of oral residue : Floor of mouth Initiation of swallow : Pyriform sinuses Soft palate elevation: No bolus between soft palate (SP)/pharyngeal wall (PW) Laryngeal elevation: Complete superior movement of thyroid cartilage with complete approximation of arytenoids to epiglottic petiole Anterior hyoid excursion: Complete Epiglottic movement: Complete Laryngeal vestibule closure: Incomplete, narrow column air/contrast in laryngeal vestibule Pharyngeal stripping wave : Present - diminished Pharyngeal contraction (A/P view only): N/A Pharyngoesophageal segment opening: Partial distention/partial duration, partial obstruction of flow Tongue base retraction: Narrow column of contrast or air between tongue base and PPW Pharyngeal residue: Collection of residue within or on pharyngeal structures Location of pharyngeal residue: Valleculae Penetration/Aspiration Scale (PAS) score: 3.  Material enters airway, remains ABOVE vocal cords and not ejected out     Moderately Thick Liquids: Moderately thick liquids (Level 3, honey thick) Moderately thick liquids (Level 3, honey thick): Not Tested     Puree: Puree Puree: Impaired Puree - Impairment: Oral Impairment; Pharyngeal impairment Lip Closure: Interlabial escape, no progression to anterior lip Bolus transport/lingual motion: Brisk tongue motion Oral residue: Residue collection on oral structures Location of oral residue : Floor of mouth; Palate Initiation of swallow: Valleculae Soft palate  elevation: No bolus between soft palate (SP)/pharyngeal wall (PW) Laryngeal elevation: Complete superior movement of thyroid cartilage with complete approximation of arytenoids to epiglottic petiole Anterior hyoid excursion: Complete Epiglottic movement: Complete Laryngeal vestibule closure: Complete, no air/contrast in laryngeal vestibule Pharyngeal stripping wave : Present - diminished Pharyngeal contraction (A/P view only): N/A Pharyngoesophageal segment opening: Partial distention/partial duration, partial obstruction of flow Tongue base retraction: Narrow column of contrast or air between tongue base and PPW Pharyngeal residue: Collection of residue within or on pharyngeal structures Location of pharyngeal residue: Valleculae; Pyriform sinuses Penetration/Aspiration Scale (PAS) score: 1.  Material does not enter airway    Solid: Solid Solid: Impaired Solid - Impairment: Oral Impairment; Pharyngeal impairment Lip Closure: Interlabial  escape, no progression to anterior lip Bolus preparation/mastication: Slow prolonged chewing/mashing with complete recollection Bolus transport/lingual motion: Slow tongue motion Oral residue: Residue collection on oral structures Location of oral residue : Floor of mouth; Palate Initiation of swallow: Valleculae Soft palate elevation: No bolus between soft palate (SP)/pharyngeal wall (PW) Laryngeal elevation: Complete superior movement of thyroid cartilage with complete approximation of arytenoids to epiglottic petiole Anterior hyoid excursion: Complete Epiglottic movement: Complete Laryngeal vestibule closure: Complete, no air/contrast in laryngeal vestibule Pharyngeal stripping wave : Present - diminished Pharyngeal contraction (A/P view only): N/A Pharyngoesophageal segment opening: Partial distention/partial duration, partial obstruction of flow Tongue base retraction: Narrow column of contrast or air between tongue base and PPW Pharyngeal  residue: Collection of residue within or on pharyngeal structures Location of pharyngeal residue: Valleculae; Pyriform sinuses Penetration/Aspiration Scale (PAS) score: 1.  Material does not enter airway    Pill: Pill Pill: Not Tested    Compensatory Strategies: Compensatory Strategies Compensatory strategies: Yes Straw: Ineffective Ineffective Straw: Thin liquid (Level 0) Multiple swallows: -- (minimally effective in reducing pharyngeal stasis) Liquid wash: -- (minimally effective in reducing pharyngeal stasis) Other(comment): -- (throat clear inconsistently effective in clearing penetration; cued cough ineffective in clearing aspiration)       General Information: Caregiver present: No   Diet Prior to this Study: Dysphagia 3 (mechanical soft); Thin liquids (Level 0)    Temperature : Febrile    Respiratory Status: WFL    Supplemental O2: Nasal cannula (2L/min)    History of Recent Intubation: No   Behavior/Cognition: Alert; Cooperative; Pleasant mood; Requires cueing  Self-Feeding Abilities: Able to self-feed  Baseline vocal quality/speech: Normal  Volitional Cough: Able to elicit  Volitional Swallow: Able to elicit  No data recorded  Goal Planning: Prognosis for improved oropharyngeal function: Fair  Barriers to Reach Goals: Overall medical prognosis  Barriers/Prognosis Comment: baseline deconditioned status  Patient/Family Stated Goal: to eat/drink safely  Consulted and agree with results and recommendations: Patient; Physician; Nurse   Pain: Pain Assessment Pain Assessment: No/denies pain    End of Session: Start Time:SLP Start Time (ACUTE ONLY): 0815  Stop Time: SLP Stop Time (ACUTE ONLY): 0905  Time Calculation:SLP Time Calculation (min) (ACUTE ONLY): 50 min  Charges: SLP Evaluations $ SLP Speech Visit: 1 Visit  SLP Evaluations $MBS Swallow: 1 Procedure $Swallowing Treatment: 1 Procedure   SLP visit diagnosis: SLP Visit  Diagnosis: Dysphagia, oropharyngeal phase (R13.12)    Past Medical History:  Past Medical History:  Diagnosis Date   Diabetes mellitus without complication ([place])    Hypertension    Myocardial infarct ([place])    Past Surgical History:  Past Surgical History:  Procedure Laterality Date   CARPAL TUNNEL RELEASE Left    CHOLECYSTECTOMY     EYE SURGERY     heart stent     triple bypass   HEMORRHOID SURGERY     TONSILLECTOMY     [name], M.S., [facility] [phone] [place])  Quintella Baton 02/13/2023, 9:52 AM

## 2023-02-13 NOTE — Discharge Summary (Signed)
Physician Discharge Summary   Patient: Peter Becker MRN: [id number] DOB: 1929/02/06  Admit date:     02/06/2023  Discharge date: 02/13/23  Discharge Physician: Verline Lema   PCP: Rusty Aus, MD     Discharge Diagnoses:  Acute on chronic systolic CHF (congestive heart failure)  Acute hypoxic respiratory failure d/t HFrEF New recurrence hypoxia w/ concern for aspiration  Pleural effusion likely d/t CHF s/p thoracentesis Acute hypoxic respiratory failure d/t HFrEF and pleural effusion  CAD (coronary artery disease) w/ Hx CABG 2007 Myocardial injury w/ elevated troponin, downtrendign:  Essential hypertension Hyperlipidemia Ischemic stroke ([place]) Type II diabetes mellitus with renal manifestations ([place]) Hyperglycemia Chronic kidney disease, stage 3a ([place]):  Anxiety  Hospital Course:  Peter Becker is a 89 y.o. male with medical history significant of sCHF with EF 45%, HTN, HLD, DM, CAD, CABG, PVD, stroke, anxiety, CKD-3a, who presents to ED from home on 02/06/2023 with SOB x1 month not responsive to outpatient tx.  03/07: oxygen saturation 88% on room air which improved to 99% on 2 L oxygen. Trop  205,  BNP 1008, WBC 8.0, stable renal function,  Chest x-ray showed bilateral pleural effusion. Patient is admitted to telemetry bed as inpatient. Dr. Rockey Situ of cardiology was consulted. Started Lasix 40 mg IV bid, continued on IV heparin 03/08: Net IO Since Admission: -1,240 mL [02/07/23 0843]. Cardiology to see. Echo pending.  03/09: Net IO Since Admission: -3,683.42 mL [02/08/23 1858] echo still pending, cardiology saw pt today  03/10: Echo showing EF 35-40%, LV mod decreased fxn, severe hypokinests inf/post wall, akinesis apical area, G2DD, RV fxn mild reduced. Starting low dose carvedilol, work on weaning O2 vs home O2 03/11: no further ischemic eval from cardiology, consider resume losartan tomorrow. Later in afternoon RN noted choking on water, SOB requiring increase O2 again. CXR  concern for pulmonary edema, infection possible but seems unlikely based on clinical picture. Will trial another IV lasix and follow repeat CXR in AM.  Patient underwent thoracentesis of the left pleural effusion that did not show any infection. Postthoracentesis chest x-ray showed only mild to moderate effusion bilaterally. Patient was cleared by cardiologist for discharge and IV Lasix switched to oral Lasix. Cardiology did not want to go hide on guideline directed medical therapy for his heart failure on account of relative hypotension as well as his age. Patient underwent a walk test that showed he does not need oxygen at discharge Patient had some mild issues with swallowing and so was seen by speech therapist and underwent a modified barium swallow eval. speech therapist have recommended mechanical soft diet. I have discussed this with patient's daughter and currently patient is being discharged home with home health services in place.       Consultants: Cardiology Procedures performed: None Disposition: Home health Diet recommendation:  Discharge Diet Orders (From admission, onward)     Start     Ordered   02/13/23 0000  Diet - low sodium heart healthy        02/13/23 1319           Cardiac diet DISCHARGE MEDICATION: Allergies as of 02/13/2023       Reactions   Tape Other (See Comments)   SKIN IS VERY THIN AND TEARS AND BRUISES EASILY; Please use an alternative!!        Medication List     STOP taking these medications    acetaminophen 650 MG suppository Commonly known as: TYLENOL Replaced by: acetaminophen 325  MG tablet   chlorhexidine 0.12 % solution Commonly known as: PERIDEX   glyBURIDE-metformin 2.5-500 MG tablet Commonly known as: GLUCOVANCE   glycopyrrolate 0.2 MG/ML injection Commonly known as: ROBINUL   hydrochlorothiazide 25 MG tablet Commonly known as: HYDRODIURIL   ipratropium-albuterol 0.5-2.5 (3) MG/3ML Soln Commonly known as: DUONEB    lisinopril 10 MG tablet Commonly known as: ZESTRIL   LORazepam 2 MG/ML injection Commonly known as: ATIVAN   metoprolol tartrate 25 MG tablet Commonly known as: LOPRESSOR   morphine (PF) 2 MG/ML injection   pioglitazone 15 MG tablet Commonly known as: ACTOS   sodium chloride 0.9 % SOLN 100 mL with valproate 500 MG/5ML SOLN 500 mg       TAKE these medications    acetaminophen 325 MG tablet Commonly known as: TYLENOL Take 2 tablets (650 mg total) by mouth every 6 (six) hours as needed for mild pain or fever. Replaces: acetaminophen 650 MG suppository   aspirin EC 81 MG tablet Take 1 tablet (81 mg total) by mouth daily. Swallow whole. Start taking on: February 14, 2023   atorvastatin 40 MG tablet Commonly known as: LIPITOR Take 1 tablet (40 mg total) by mouth daily. Start taking on: February 14, 2023   carvedilol 3.125 MG tablet Commonly known as: COREG Take 1 tablet (3.125 mg total) by mouth 2 (two) times daily with a meal.   cyanocobalamin 1000 MCG/ML injection Commonly known as: VITAMIN B12 INJ 1 ML IM Q 14 DAYS   furosemide 40 MG tablet Commonly known as: LASIX Take 1 tablet (40 mg total) by mouth daily. Start taking on: February 14, 2023   glimepiride 4 MG tablet Commonly known as: AMARYL Take 4 mg by mouth daily with breakfast.   losartan 25 MG tablet Commonly known as: COZAAR Take 0.5 tablets (12.5 mg total) by mouth daily. What changed:  how much to take Another medication with the same name was removed. Continue taking this medication, and follow the directions you see here.   tamsulosin 0.4 MG Caps capsule Commonly known as: FLOMAX Take 0.4 mg by mouth daily.               Durable Medical Equipment  (From admission, onward)           Start     Ordered   02/13/23 1228  For home use only DME Hospital bed  Once       Question Answer Comment  Length of Need Lifetime   The above medical condition requires: Patient requires the ability to  reposition frequently   Head must be elevated greater than: 30 degrees   Bed type Semi-electric      02/13/23 1228   02/12/23 1533  For home use only DME Walker rolling  Once       Question Answer Comment  Walker: With [place]   Patient needs a walker to treat with the following condition Ambulatory dysfunction      02/12/23 1533              Discharge Care Instructions  (From admission, onward)           Start     Ordered   02/13/23 0000  No dressing needed        02/13/23 1319            Follow-up Information     End, Harrell Gave, MD. Schedule an appointment as soon as possible for a visit.   Specialty: Cardiology Contact information:  [address] [place] [address] [phone]                Discharge Exam: Danley Danker Weights   02/09/23 0500 02/10/23 0840 02/11/23 0804  Weight: 61.2 kg 59 kg 57.9 kg  Physical Exam Constitutional:      General: He is not in acute distress.    Appearance: He is well-developed.  Cardiovascular:     Rate and Rhythm: Normal rate and regular rhythm.  Pulmonary:     Effort: No tachypnea.     Breath sounds: Decreased bilaterally at the bases Musculoskeletal:     Right lower leg: No edema.     Left lower leg: No edema.  Skin:    General: Skin is warm and dry.  Neurological:     General: No focal deficit present.     Mental Status: He is alert and oriented to person, place, and time.  Psychiatric:        Mood and Affect: Mood normal.        Behavior: Behavior normal.   Condition at discharge: good  Discharge time spent: greater than 30 minutes.  Signed: Verline Lema, MD [facility] 02/13/2023

## 2023-02-13 NOTE — Progress Notes (Signed)
Rounding Note    Patient Name: Peter Becker Date of Encounter: 02/13/2023  [place] Cardiologist: New  Subjective   On 2-3L O2. No chest pain reported. Possible d/c today.  Inpatient Medications    Scheduled Meds:  aspirin EC  81 mg Oral Daily   atorvastatin  40 mg Oral Daily   carvedilol  3.125 mg Oral BID WC   enoxaparin (LOVENOX) injection  30 mg Subcutaneous Daily   furosemide  40 mg Oral Daily   losartan  12.5 mg Oral Daily   tamsulosin  0.4 mg Oral Daily   Continuous Infusions:  PRN Meds: acetaminophen, albuterol, dextromethorphan-guaiFENesin, diphenhydrAMINE, hydrALAZINE, LORazepam, nitroGLYCERIN   Vital Signs    Vitals:   02/12/23 1947 02/12/23 2336 02/13/23 0409 02/13/23 0740  BP: 113/63 (!) 108/56  (!) 109/55  Pulse: 74 76 86 82  Resp: '20 20  18  '$ Temp: 98 F (36.7 C) 97.6 F (36.4 C)  97.8 F (36.6 C)  TempSrc:      SpO2: 95% (!) 89% 91% 95%  Weight:      Height:        Intake/Output Summary (Last 24 hours) at 02/13/2023 1027 Last data filed at 02/13/2023 0413 Gross per 24 hour  Intake 480 ml  Output 1400 ml  Net -920 ml      02/11/2023    8:04 AM 02/10/2023    8:40 AM 02/09/2023    5:00 AM  Last 3 Weights  Weight (lbs) 127 lb 11.2 oz 130 lb 1.1 oz 134 lb 14.7 oz  Weight (kg) 57.924 kg 59 kg 61.2 kg      Telemetry    NSR PVCs - Personally Reviewed  ECG    No new - Personally Reviewed  Physical Exam   GEN: No acute distress.   Neck: No JVD Cardiac: RRR, no murmurs, rubs, or gallops.  Respiratory: right base crackles. GI: Soft, nontender, non-distended  MS: mild lower leg edema; No deformity. Neuro:  Nonfocal  Psych: Normal affect   Labs    High Sensitivity Troponin:   Recent Labs  Lab 02/06/23 1221 02/06/23 1610  TROPONINIHS 205* 173*     Chemistry Recent Labs  Lab 02/07/23 0440 02/08/23 0352 02/10/23 0425 02/11/23 0440 02/12/23 0505  NA 143   < > 132* 136 137  K 3.9   < > 4.3 4.3 4.3  CL 105   <  > 96* 95* 97*  CO2 30   < > 33* 34* 32  GLUCOSE 47*   < > 171* 191* 235*  BUN 31*   < > 39* 37* 35*  CREATININE 1.28*   < > 1.53* 1.39* 1.32*  CALCIUM 8.3*   < > 7.8* 8.2* 8.2*  MG 2.0  --   --   --   --   GFRNONAA 53*   < > 42* 48* 51*  ANIONGAP 8   < > 3* 7 8   < > = values in this interval not displayed.    Lipids  Recent Labs  Lab 02/07/23 0440  CHOL 126  TRIG 49  HDL 77  LDLCALC 39  CHOLHDL 1.6    Hematology Recent Labs  Lab 02/09/23 0428 02/11/23 0440 02/12/23 0505  WBC 6.7 6.3 5.9  RBC 3.66* 3.73* 3.73*  HGB 11.5* 11.7* 11.7*  HCT 35.6* 37.1* 36.8*  MCV 97.3 99.5 98.7  MCH 31.4 31.4 31.4  MCHC 32.3 31.5 31.8  RDW 12.6 12.4 12.2  PLT 187 184  210   Thyroid No results for input(s): "TSH", "FREET4" in the last 168 hours.  BNP Recent Labs  Lab 02/06/23 1221  BNP 1,008.1*    DDimer No results for input(s): "DDIMER" in the last 168 hours.   Radiology    DG Chest Port 1 View  Result Date: 02/12/2023 CLINICAL DATA:  Shortness of breath EXAM: PORTABLE CHEST 1 VIEW COMPARISON:  02/11/2023 FINDINGS: Post sternotomy changes. Elevation of the left diaphragm. Small moderate bilateral effusions without significant change. Cardiomegaly with vascular congestion and interstitial edema similar to slightly improved. Persistent basilar consolidations. Aortic atherosclerosis. IMPRESSION: Cardiomegaly with vascular congestion and interstitial edema, similar to slightly improved. Persistent small to moderate bilateral effusions with basilar consolidations. Electronically Signed   By: Donavan Foil M.D.   On: 02/12/2023 17:49   DG Chest Port 1 View  Result Date: 02/11/2023 CLINICAL DATA:  Status post thoracentesis EXAM: PORTABLE CHEST 1 VIEW COMPARISON:  CXR 02/11/23 FINDINGS: Marked interval improvement in aeration of the left lung status post thoracentesis. There is now a small residual left-sided pleural effusion. No pneumothorax. Unchanged small right-sided pleural effusion.  There are hazy bibasilar airspace opacities, which are nonspecific, but favored to represent atelectasis. Status post median sternotomy. No radiographically apparent displaced rib fracture. IMPRESSION: 1. Marked interval improvement in aeration of the left lung status post thoracentesis. There is now a small residual left-sided pleural effusion. No pneumothorax. 2. Unchanged small right-sided pleural effusion. 3. Hazy bibasilar airspace opacities, which are nonspecific, but favored to represent atelectasis. Electronically Signed   By: Marin Roberts M.D.   On: 02/11/2023 16:48   US THORACENTESIS ASP PLEURAL SPACE W/IMG GUIDE  Result Date: 02/11/2023 INDICATION: History of CHF with EF 45%, CVA, CKD stage 3. Patient presented to ED from home with SOB and cough x1 month. CXR found bilateral pleural effusions. Patient referred to IR for diagnostic and therapeutic thoracentesis. EXAM: ULTRASOUND GUIDED DIAGNOSTIC AND THERAPEUTIC LEFT THORACENTESIS MEDICATIONS: None. COMPLICATIONS: None immediate. PROCEDURE: An ultrasound guided thoracentesis was thoroughly discussed with the patient and questions answered. The benefits, risks, alternatives and complications were also discussed. The patient understands and wishes to proceed with the procedure. Written consent was obtained. Ultrasound was performed to localize and mark an adequate pocket of fluid in the left chest. The area was then prepped and draped in the normal sterile fashion. 1% Lidocaine was used for local anesthesia. Under ultrasound guidance a 19 gauge, 7-cm, Yueh catheter was introduced. Thoracentesis was performed. The catheter was removed and a dressing applied. FINDINGS: A total of approximately 650 cc of clear, amber fluid was removed. Samples were sent to the laboratory as requested by the clinical team. IMPRESSION: Successful ultrasound guided left thoracentesis yielding 650 cc of pleural fluid. Read by: Narda Rutherford, AGNP-BC Electronically Signed   By:  Ruthann Cancer M.D.   On: 02/11/2023 16:25    Cardiac Studies   TTE 02/09/23 1. Left ventricular ejection fraction, by estimation, is 35 to 40%. Left  ventricular ejection fraction by 2D MOD biplane is 45.9 %. The left  ventricle has moderately decreased function. The left ventricle  demonstrates regional wall motion abnormalities  (severe hypokinesis of the inferior/posterior wall, akinesis of the apical  and peri-apical region). No apical thrombus noted. There is moderate  asymmetric left ventricular hypertrophy of the basal-septal segment. Left  ventricular diastolic parameters are   consistent with Grade II diastolic dysfunction (pseudonormalization).   2. Right ventricular systolic function is mildly reduced. The right  ventricular size is moderately  enlarged. There is normal pulmonary artery  systolic pressure. The estimated right ventricular systolic pressure is  123456 mmHg.   3. The mitral valve is normal in structure. Mild to moderate mitral valve  regurgitation. No evidence of mitral stenosis.   4. Tricuspid valve regurgitation is moderate.   5. The aortic valve has an indeterminant number of cusps. There is mild  calcification of the aortic valve. Aortic valve regurgitation is not  visualized. Aortic valve sclerosis is present, with no evidence of aortic  valve stenosis.   6. The inferior vena cava is normal in size with greater than 50%  respiratory variability, suggesting right atrial pressure of 3 mmHg.   Patient Profile     89 y.o. male with a past medical history of HFrEF, hypertension, hyperlipidemia, type 2 diabetes, coronary artery disease status post CABG x 3 vessel (2007), peripheral vascular disease, CVA, pernicious anemia, anxiety, CKD stage IIIa, who has been seen and evaluated for shortness of breath and elevated high-sensitivity troponin.   Assessment & Plan    Elevated troponin CAD s/p CABG - no chest pain reported - HS troponin trend flat - IV heparin x  48 hours - continue ASA and statin therapy - Echo showed LVEF 35-40%, regional wall motion abnormalities (see report), moderate LVH, G2DD, mild to mod MR, mod TR - prior echo showed LVEF 50-55% - no plans for invasive testing at this time  Acute on chronic HFrEF with bilateral pleural effusions - echo showed reduced LVEF 35-40%, G2DD, mild to moderate MR, moderate TR - s/p thoracentesis - supplemental 2-4L O2>wean as able - continue Coreg and losartan - lasix '40mg'$  daily - s/p IV lasix '80mg'$  3/13. Will given another dose today - Net -7.9L - continue lasix '40mg'$  daily  HTN - Bps stable  HLD - LDL 39 - continue Lipitor '40mg'$  daily  H/o ischemic stroke - continue ASA and statin  CKD stage 3 - kidney function stable  For questions or updates, please contact [place] Please consult [email or web address] for contact info under        Signed, Lusine Corlett Ninfa Meeker, PA-C  02/13/2023, 10:27 AM

## 2023-02-13 NOTE — Progress Notes (Signed)
Occupational Therapy Treatment Patient Details Name: Peter Becker MRN: [id number] DOB: 1929-07-17 Today's Date: 02/13/2023   History of present illness Pt admitted for acute/chronic CHF with complaints of SOB symptoms. HIstory includes CHF, HTN, HLD, DM, CAD, CVA, anxiety, and CKD.   OT comments  Chart reviewed, pt greeted in bed with daughter present, agreeable to OT tx session. Tx session targeted improving functional activity tolerance in order to improve ADL status. Improvements noted in STS from bed 10x with CGA-MIN A. Trialed supine>sit with HOB lowered with supervision. Discussion with pt and daughter re: safe use of DME/ADL completion, all questions. Pt is left in bedside chair, all needs met. Discharge recommendation remains appropriate. OT will continue to follow acutely.     Recommendations for follow up therapy are one component of a multi-disciplinary discharge planning process, led by the attending physician.  Recommendations may be updated based on patient status, additional functional criteria and insurance authorization.    Follow Up Recommendations  Home health OT     Assistance Recommended at Discharge Set up Supervision/Assistance  Patient can return home with the following  A little help with walking and/or transfers;A little help with bathing/dressing/bathroom;Help with stairs or ramp for entrance   Equipment Recommendations  BSC/3in1;Hospital bed    Recommendations for Other Services      Precautions / Restrictions Precautions Precautions: Fall Restrictions Weight Bearing Restrictions: No       Mobility Bed Mobility Overal bed mobility: Needs Assistance Bed Mobility: Supine to Sit (hob lowered)     Supine to sit: Supervision          Transfers Overall transfer level: Needs assistance Equipment used: Rolling walker (2 wheels) Transfers: Sit to/from Stand Sit to Stand: Min guard, Supervision (10x with RW with CGA-MIN A from low bed)            General transfer comment: intermittent vcs for safety     Balance Overall balance assessment: Needs assistance Sitting-balance support: Feet supported Sitting balance-Leahy Scale: Normal     Standing balance support: No upper extremity supported, During functional activity Standing balance-Leahy Scale: Good                             ADL either performed or assessed with clinical judgement   ADL Overall ADL's : Needs assistance/impaired     Grooming: Wash/dry face;Set up;Sitting                   Toilet Transfer: Min guard;Rolling walker (2 wheels) Toilet Transfer Details (indicate cue type and reason): simulated, intermittent vcs for safety         Functional mobility during ADLs: Min guard;Rolling walker (2 wheels)      Extremity/Trunk Assessment              Vision       Perception     Praxis      Cognition Arousal/Alertness: Awake/alert Behavior During Therapy: WFL for tasks assessed/performed, Flat affect Overall Cognitive Status: Within Functional Limits for tasks assessed                                          Exercises Other Exercises Other Exercises: edu pt and daughter re: role of OT, role of rehab, DME use, safe ADL completion    Shoulder Instructions  General Comments spo2 >90% on 2 L via [place]    Pertinent Vitals/ Pain       Pain Assessment Pain Assessment: No/denies pain  Home Living                                          Prior Functioning/Environment              Frequency  Min 2X/week        Progress Toward Goals  OT Goals(current goals can now be found in the care plan section)  Progress towards OT goals: Progressing toward goals     Plan Discharge plan remains appropriate;Frequency remains appropriate    Co-evaluation                 AM-PAC OT "6 Clicks" Daily Activity     Outcome Measure   Help from another person eating meals?:  None Help from another person taking care of personal grooming?: A Little Help from another person toileting, which includes using toliet, bedpan, or urinal?: A Little Help from another person bathing (including washing, rinsing, drying)?: A Little Help from another person to put on and taking off regular upper body clothing?: None Help from another person to put on and taking off regular lower body clothing?: A Little 6 Click Score: 20    End of Session Equipment Utilized During Treatment: Rolling walker (2 wheels)  OT Visit Diagnosis: Other abnormalities of gait and mobility (R26.89);Muscle weakness (generalized) (M62.81)   Activity Tolerance Patient tolerated treatment well   Patient Left with call bell/phone within reach;in chair;with family/visitor present;with chair alarm set   Nurse Communication Mobility status        Time: [id number] OT Time Calculation (min): 16 min  Charges: OT General Charges $OT Visit: 1 Visit OT Treatments $Self Care/Home Management : 8-22 mins  Shanon Payor, OTD OTR/L  02/13/23, 10:51 AM

## 2023-02-13 NOTE — Progress Notes (Signed)
   Heart Failure Nurse Navigator Note  Met with patient and his daughter who was at the bedside.  Patient was awake and alert sitting up in the chair eating breakfast without difficulty.  Daughter states that he is possibly being discharged home today.  Discussed diet, fluid restriction.  Also went into changes that they are going to make at home.  Went over eating at [employer].  Daughter states at one point that she was overwhelmed and and did have a meltdown but is doing better now.  She had no further questions.  Pricilla Riffle RN CHFN

## 2023-02-13 NOTE — TOC Transition Note (Signed)
Transition of Care [facility]) - CM/SW Discharge Note   Patient Details  Name: Peter Becker MRN: [id number] Date of Birth: 1929/10/24  [facility]) CM/SW Contact:  Candie Chroman, LCSW Phone Number: 02/13/2023, 1:45 PM   Clinical Narrative: Patient has orders to discharge home today. [place] liaison is aware. Hospital bed will be delivered tomorrow after 2:00 per daughter request so they can get the room ready. Patient does not qualify for home oxygen. No further concerns. CSW signing off.    Final next level of care: Home w Home Health Services Barriers to Discharge: Barriers Resolved   Patient Goals and CMS Choice CMS Medicare.gov Compare Post Acute Care list provided to:: Patient Choice offered to / list presented to : Patient  Discharge Placement                  Patient to be transferred to facility by: Daughter and son-in-law Name of family member notified: Varney Biles Patient and family notified of of transfer: 02/13/23  Discharge Plan and Services Additional resources added to the After Visit Summary for                  DME Arranged: Walker rolling, Hospital bed DME Agency: [employer] Date DME Agency Contacted: 02/13/23   Representative spoke with at DME Agency: [place]: RN, PT, OT, Speech Therapy [place] Agency: [place] ([place]) Date [place]: 02/13/23   Representative spoke with at [place]: [place] ([place]) Interventions SDOH Screenings   Food Insecurity: No Food Insecurity (02/07/2023)  Housing: Low Risk  (02/07/2023)  Transportation Needs: No Transportation Needs (02/07/2023)  Utilities: Not At Risk (02/07/2023)  Tobacco Use: Medium Risk (02/07/2023)     Readmission Risk Interventions     No data to display

## 2023-02-13 NOTE — TOC Progression Note (Signed)
Transition of Care [facility]) - Progression Note    Patient Details  Name: DESIDERIO MOSKWA MRN: [id number] Date of Birth: [birth year]  Transition of Care [facility]) CM/SW [place], LCSW Phone Number: 02/13/2023, 1:09 PM  Clinical Narrative:   Discussed DME recommendations. RW delivered to the room. Patient already has a BSC, elevated toilet seat, and shower chair at home. The shower chair is too large for his tube due to the age of the home (89 years old). Family is making renovations for walk-in shower. Daughter agreeable to hospital bed. Ordered through [employer]. [place] test for home oxygen pending.  Expected Discharge Plan: Home/Self Care Barriers to Discharge: Continued Medical Work up  Expected Discharge Plan and Services       Living arrangements for the past 2 months: Single Family Home                                       Social Determinants of Health (SDOH) Interventions SDOH Screenings   Food Insecurity: No Food Insecurity (02/07/2023)  Housing: Low Risk  (02/07/2023)  Transportation Needs: No Transportation Needs (02/07/2023)  Utilities: Not At Risk (02/07/2023)  Tobacco Use: Medium Risk (02/07/2023)    Readmission Risk Interventions     No data to display

## 2023-02-13 NOTE — Progress Notes (Signed)
Modified Barium Swallow Study  Patient Details  Name: Peter Becker MRN: [id number] Date of Birth: [birth year]  Today's Date: 02/13/2023  Modified Barium Swallow completed.  Full report located under Chart Review in the Imaging Section.  History of Present Illness Pt is a 89 y.o. male with medical history significant of sCHF with EF 35-40%, HTN, HLD, DM, CAD, CABG, PVD, stroke, anxiety, CKD-3a, who presents to ED from home on 02/06/2023 with SOB x1 month not responsive to outpatient tx.  Cardiology is following w/ scheduled thoracentesis 02/11/23 per NSG.  Last MRI in 2018: Chronic microvascular ischemia and old right corona radiata  lacunar infarct.   CXR currently:  large left pleural effusion with near-complete  opacification of the left hemithorax. Unchanged pulmonary edema and  small right pleural effusion.   Clinical Impression Pt presents with a mild oropharyngeal dysphagia. Oral phase remarkable for prolonged mastication of solids, trace-mild oral residual across consistencies, and inconsistent disorganized lingual movement for A-P transit. Pharyngeal phase remarkable for swallow initiation at the level of the valleculae/pyriform sinus, before the swallow penetration of both nectar-thick and thin liquids via tsp, cup, and straw sip with subsequent silent aspiration of thin liquids via straw sip, trace-moderate pharyngeal stasis which increased with visocity. Compensations of cued/spontaneous secondary swallow, cued throat clearing, and use of liquid wash were inconsistently effective. Cued cough was not effective. Above mentioned deficits secondary to observed or inferred: lingual weakness/incoordination, reduced base of tongue retraction, mistimed laryngeal vestibule closure, reduced amplitude/duration of UES opening, and reduced laryngeal and pharyngeal sensation.   Factors that may increase risk of adverse event in presence of aspiration ([place] 2021): Frail or  deconditioned  Swallow Evaluation Recommendations Recommendations: PO diet PO Diet Recommendation: Dysphagia 3 (Mechanical soft);Thin liquids (Level 0) Liquid Administration via: No straw Medication Administration: Crushed with puree Supervision: Patient able to self-feed;Full supervision/cueing for swallowing strategies (set up) Swallowing strategies  : Minimize environmental distractions;Slow rate;Small bites/sips;Follow solids with liquids;Clear throat intermittently Postural changes: Position pt fully upright for meals;Stay upright 30-60 min after meals;Out of bed for meals Oral care recommendations: Oral care QID (4x/day);Staff/trained caregiver to provide oral care (denture care)     [name], M.S., [facility] [phone] [place])  Quintella Baton 02/13/2023,9:48 AM

## 2023-02-13 NOTE — Progress Notes (Signed)
SATURATION QUALIFICATIONS: (This note is used to comply with regulatory documentation for home oxygen)  Patient Saturations on Room Air at Rest = 89%  Patient Saturations on Room Air while Ambulating = 92%  Patient Saturations on 0 Liters of oxygen while Ambulating = 92%  Please briefly explain why patient needs home oxygen:

## 2023-02-13 NOTE — TOC CM/SW Note (Signed)
    Durable Medical Equipment  (From admission, onward)           Start     Ordered   02/13/23 1228  For home use only DME Hospital bed  Once       Question Answer Comment  Length of Need Lifetime   The above medical condition requires: Patient requires the ability to reposition frequently   Head must be elevated greater than: 30 degrees   Bed type Semi-electric      02/13/23 1228   02/12/23 1533  For home use only DME Walker rolling  Once       Question Answer Comment  Walker: With [place]   Patient needs a walker to treat with the following condition Ambulatory dysfunction      02/12/23 1533

## 2023-02-15 LAB — BODY FLUID CULTURE W GRAM STAIN: Culture: NO GROWTH

## 2023-02-17 NOTE — Progress Notes (Unsigned)
   Patient ID: Peter Becker, male    DOB: 1929-08-11, 89 y.o.   MRN: [id number]  HPI  Peter Becker is a 89 y/o male with a history of  Echo 02/09/23: EF 35-40%, moderate LVH, mild/ moderate Peter, moderate TR  Admitted 02/06/23 due to SOB d/t heart failure exacerbation. IV diuresed. Thoracentesis of the left pleural effusion.   He presents today for his initial visit with a chief complaint of   Review of Systems    Physical Exam    Assessment & Plan:  1: Chronic heart failure with reduced ejection fraction- - NYHA class - echo 02/09/23: EF 35-40%, moderate LVH, mild/ moderate Peter, moderate TR - BNP 02/06/23 was 1008.1  2: HTN w/ CKD- - BP - saw PCP Sabra Heck) 09/02/22 - BMP 02/13/23 showed sodium 140, potassium 3.9, creatinine 1.37 & GFR 48  3: DM- - A1c 02/06/23 was 11.3%  4: CAD- - CABG X3 in 2007  5: PVD-

## 2023-02-18 ENCOUNTER — Encounter: Payer: Self-pay | Admitting: Family

## 2023-02-18 ENCOUNTER — Ambulatory Visit: Payer: Medicare Other | Attending: Family | Admitting: Family

## 2023-02-18 VITALS — BP 135/68 | HR 79 | Wt 124.0 lb

## 2023-02-18 DIAGNOSIS — I252 Old myocardial infarction: Secondary | ICD-10-CM | POA: Insufficient documentation

## 2023-02-18 DIAGNOSIS — Z87891 Personal history of nicotine dependence: Secondary | ICD-10-CM | POA: Insufficient documentation

## 2023-02-18 DIAGNOSIS — I130 Hypertensive heart and chronic kidney disease with heart failure and stage 1 through stage 4 chronic kidney disease, or unspecified chronic kidney disease: Secondary | ICD-10-CM | POA: Insufficient documentation

## 2023-02-18 DIAGNOSIS — Z7984 Long term (current) use of oral hypoglycemic drugs: Secondary | ICD-10-CM | POA: Insufficient documentation

## 2023-02-18 DIAGNOSIS — J90 Pleural effusion, not elsewhere classified: Secondary | ICD-10-CM | POA: Insufficient documentation

## 2023-02-18 DIAGNOSIS — N1831 Chronic kidney disease, stage 3a: Secondary | ICD-10-CM

## 2023-02-18 DIAGNOSIS — Z79899 Other long term (current) drug therapy: Secondary | ICD-10-CM | POA: Diagnosis not present

## 2023-02-18 DIAGNOSIS — I10 Essential (primary) hypertension: Secondary | ICD-10-CM

## 2023-02-18 DIAGNOSIS — I5022 Chronic systolic (congestive) heart failure: Secondary | ICD-10-CM | POA: Diagnosis present

## 2023-02-18 DIAGNOSIS — E1122 Type 2 diabetes mellitus with diabetic chronic kidney disease: Secondary | ICD-10-CM | POA: Diagnosis not present

## 2023-02-18 DIAGNOSIS — E1151 Type 2 diabetes mellitus with diabetic peripheral angiopathy without gangrene: Secondary | ICD-10-CM | POA: Insufficient documentation

## 2023-02-18 DIAGNOSIS — R600 Localized edema: Secondary | ICD-10-CM | POA: Insufficient documentation

## 2023-02-18 DIAGNOSIS — R42 Dizziness and giddiness: Secondary | ICD-10-CM | POA: Diagnosis not present

## 2023-02-18 DIAGNOSIS — N189 Chronic kidney disease, unspecified: Secondary | ICD-10-CM | POA: Insufficient documentation

## 2023-02-18 DIAGNOSIS — R5383 Other fatigue: Secondary | ICD-10-CM | POA: Diagnosis not present

## 2023-02-18 DIAGNOSIS — Z951 Presence of aortocoronary bypass graft: Secondary | ICD-10-CM | POA: Diagnosis not present

## 2023-02-18 DIAGNOSIS — I2510 Atherosclerotic heart disease of native coronary artery without angina pectoris: Secondary | ICD-10-CM | POA: Diagnosis not present

## 2023-02-18 NOTE — Patient Instructions (Signed)
Get compression socks and put them on every morning with removal at bedtime

## 2023-03-18 ENCOUNTER — Ambulatory Visit: Payer: Medicare Other | Admitting: Medical

## 2023-03-21 ENCOUNTER — Other Ambulatory Visit
Admission: RE | Admit: 2023-03-21 | Discharge: 2023-03-21 | Disposition: A | Discharge status: Home / Self Care | Payer: Medicare Other | Attending: Medical | Admitting: Medical

## 2023-03-21 ENCOUNTER — Ambulatory Visit: Payer: Medicare Other | Attending: Medical | Admitting: Medical

## 2023-03-21 ENCOUNTER — Encounter: Payer: Self-pay | Admitting: Medical

## 2023-03-21 VITALS — BP 100/58 | HR 82 | Ht 67.0 in | Wt 112.0 lb

## 2023-03-21 DIAGNOSIS — E782 Mixed hyperlipidemia: Secondary | ICD-10-CM | POA: Diagnosis present

## 2023-03-21 DIAGNOSIS — I5022 Chronic systolic (congestive) heart failure: Secondary | ICD-10-CM | POA: Diagnosis not present

## 2023-03-21 DIAGNOSIS — N1831 Chronic kidney disease, stage 3a: Secondary | ICD-10-CM | POA: Insufficient documentation

## 2023-03-21 DIAGNOSIS — I2510 Atherosclerotic heart disease of native coronary artery without angina pectoris: Secondary | ICD-10-CM

## 2023-03-21 DIAGNOSIS — I10 Essential (primary) hypertension: Secondary | ICD-10-CM

## 2023-03-21 DIAGNOSIS — I5A Non-ischemic myocardial injury (non-traumatic): Secondary | ICD-10-CM

## 2023-03-21 DIAGNOSIS — I639 Cerebral infarction, unspecified: Secondary | ICD-10-CM | POA: Insufficient documentation

## 2023-03-21 LAB — BASIC METABOLIC PANEL
Anion gap: 8 (ref 5–15)
BUN: 28 mg/dL — ABNORMAL HIGH (ref 8–23)
CO2: 30 mmol/L (ref 22–32)
Calcium: 8.8 mg/dL — ABNORMAL LOW (ref 8.9–10.3)
Chloride: 99 mmol/L (ref 98–111)
Creatinine, Ser: 1.26 mg/dL — ABNORMAL HIGH (ref 0.61–1.24)
GFR, Estimated: 54 mL/min — ABNORMAL LOW (ref >60)
Glucose, Bld: 322 mg/dL — ABNORMAL HIGH (ref 70–99)
Potassium: 5 mmol/L (ref 3.5–5.1)
Sodium: 137 mmol/L (ref 135–145)

## 2023-03-21 NOTE — Patient Instructions (Signed)
Medication Instructions:  Your physician recommends that you continue on your current medications as directed. Please refer to the Current Medication list given to you today.  *If you need a refill on your cardiac medications before your next appointment, please call your pharmacy*  Lab Work: Your physician recommends that you get lab work today: Press photographer at [facility] 1st desk on the right to check in (REGISTRATION)  Lab hours: Monday- Friday (7:30 am- 5:30 pm)   If you have labs (blood work) drawn today and your tests are completely normal, you will receive your results only by: MyChart Message (if you have MyChart) OR A paper copy in the mail If you have any lab test that is abnormal or we need to change your treatment, we will call you to review the results.  Testing/Procedures: -None ordered  Follow-Up: At [facility], you and your health needs are our priority.  As part of our continuing mission to provide you with exceptional heart care, we have created designated Provider Care Teams.  These Care Teams include your primary Cardiologist (physician) and Advanced Practice Providers (APPs -  Physician Assistants and Nurse Practitioners) who all work together to provide you with the care you need, when you need it.  We recommend signing up for the patient portal called "MyChart".  Sign up information is provided on this After Visit Summary.  MyChart is used to connect with patients for Virtual Visits (Telemedicine).  Patients are able to view lab/test results, encounter notes, upcoming appointments, etc.  Non-urgent messages can be sent to your provider as well.   To learn more about what you can do with MyChart, go to [email or web address].    Your next appointment:   2 month(s)  Provider:   You may see one of the following Advanced Practice Providers on your designated Care Team:   Nicolasa Ducking, NP Eula Listen, PA-C Cadence Fransico Michael, PA-C Charlsie Quest,  NP    Other Instructions -None

## 2023-03-21 NOTE — Progress Notes (Signed)
Cardiology Office Note:    Date:  03/21/2023   ID:  Peter Becker, DOB 01-01-1929, MRN [id number]  PCP:  Peter Penton, MD  [facility] Cardiologist:  None  [facility] Electrophysiologist:  None   Referring MD: Peter Penton, MD   Chief Complaint: Hospital follow-up  History of Present Illness:    Peter Becker is a 89 y.o. male with a hx of HFrEF, hypertension, hyperlipidemia, type 2 diabetes, coronary artery disease status post CABG x 3 in 2007, peripheral vascular disease, CVA in 2018, pernicious anemia, anxiety, CKD stage III who is being seen for hospital follow-up.  Patient was recently admitted March 2024 with shortness of breath not responsive to outpatient treatment.  Troponin was elevated at 205, BNP greater than 1000.  Patient was started on IV Lasix and IV heparin.  Echo showed EF of 35 to 40%.  Patient underwent thoracentesis of the left pleural effusion that did not show any infection. Patient was transitioned to GDMT for CHF. Plan not to pursue further ischemic testing given age and other comormidities.  Today, the patient has been doing OK since being home. Breathing is a lot better. He walks by himself, he occasionally uses a walker. No chest pain. No lower leg edema.  He does PT at home. We discussed ischemic testing vs medical management.   Past Medical History:  Diagnosis Date   CHF (congestive heart failure)    Chronic kidney disease    Coronary artery disease    Diabetes mellitus without complication    Hypertension    Myocardial infarct    PVD (peripheral vascular disease)     Past Surgical History:  Procedure Laterality Date   CARPAL TUNNEL RELEASE Left    CHOLECYSTECTOMY     EYE SURGERY     heart stent     triple bypass   HEMORRHOID SURGERY     TONSILLECTOMY      Current Medications: Current Meds  Medication Sig   acetaminophen (TYLENOL) 325 MG tablet Take 2 tablets (650 mg total) by mouth every 6 (six) hours as needed for mild pain or  fever.   aspirin EC 81 MG tablet Take 1 tablet (81 mg total) by mouth daily. Swallow whole.   atorvastatin (LIPITOR) 40 MG tablet Take 1 tablet (40 mg total) by mouth daily.   carvedilol (COREG) 3.125 MG tablet Take 1 tablet (3.125 mg total) by mouth 2 (two) times daily with a meal.   cyanocobalamin (,VITAMIN B-12,) 1000 MCG/ML injection INJ 1 ML IM Q 14 DAYS   furosemide (LASIX) 40 MG tablet Take 1 tablet (40 mg total) by mouth daily.   glimepiride (AMARYL) 4 MG tablet Take 4 mg by mouth daily with breakfast.   losartan (COZAAR) 25 MG tablet Take 0.5 tablets (12.5 mg total) by mouth daily.   pioglitazone (ACTOS) 15 MG tablet Take 15 mg by mouth daily.   tamsulosin (FLOMAX) 0.4 MG CAPS capsule Take 0.4 mg by mouth daily.     Allergies:   Tape   Social History   Socioeconomic History   Marital status: Widowed    Spouse name: Not on file   Number of children: Not on file   Years of education: Not on file   Highest education level: Not on file  Occupational History   Not on file  Tobacco Use   Smoking status: Former   Smokeless tobacco: Never  Vaping Use   Vaping Use: Never used  Substance and Sexual Activity  Alcohol use: No   Drug use: No   Sexual activity: Not on file  Other Topics Concern   Not on file  Social History Narrative   Not on file   Social Determinants of Health   Financial Resource Strain: Not on file  Food Insecurity: No Food Insecurity (02/07/2023)   Hunger Vital Sign    Worried About Running Out of Food in the Last Year: Never true    Ran Out of Food in the Last Year: Never true  Transportation Needs: No Transportation Needs (02/07/2023)   PRAPARE - Administrator, Civil Service (Medical): No    Lack of Transportation (Non-Medical): No  Physical Activity: Not on file  Stress: Not on file  Social Connections: Not on file     Family History: The patient's family history includes Heart attack in his father.  ROS:   Please see the history  of present illness.     All other systems reviewed and are negative.  EKGs/Labs/Other Studies Reviewed:    The following studies were reviewed today:  Echo 01/2023 1. Left ventricular ejection fraction, by estimation, is 35 to 40%. Left  ventricular ejection fraction by 2D MOD biplane is 45.9 %. The left  ventricle has moderately decreased function. The left ventricle  demonstrates regional wall motion abnormalities  (severe hypokinesis of the inferior/posterior wall, akinesis of the apical  and peri-apical region). No apical thrombus noted. There is moderate  asymmetric left ventricular hypertrophy of the basal-septal segment. Left  ventricular diastolic parameters are   consistent with Grade II diastolic dysfunction (pseudonormalization).   2. Right ventricular systolic function is mildly reduced. The right  ventricular size is moderately enlarged. There is normal pulmonary artery  systolic pressure. The estimated right ventricular systolic pressure is  30.9 mmHg.   3. The mitral valve is normal in structure. Mild to moderate mitral valve  regurgitation. No evidence of mitral stenosis.   4. Tricuspid valve regurgitation is moderate.   5. The aortic valve has an indeterminant number of cusps. There is mild  calcification of the aortic valve. Aortic valve regurgitation is not  visualized. Aortic valve sclerosis is present, with no evidence of aortic  valve stenosis.   6. The inferior vena cava is normal in size with greater than 50%  respiratory variability, suggesting right atrial pressure of 3 mmHg.   Echo 11/2017 Study Conclusions   - Left ventricle: The cavity size was normal. Systolic function was    normal. The estimated ejection fraction was in the range of 50%    to 55%. Hypokinesis of the apical myocardium. Doppler parameters    are consistent with abnormal left ventricular relaxation (grade 1    diastolic dysfunction). Doppler parameters are consistent with    high  ventricular filling pressure.  - Aortic valve: Transvalvular velocity was within the normal range.    There was no stenosis. There was no regurgitation. Valve area    (VTI): 2.8 cm^2. Valve area (Vmax): 2.67 cm^2. Valve area    (Vmean): 2.38 cm^2.  - Mitral valve: Transvalvular velocity was within the normal range.    There was no evidence for stenosis. There was mild regurgitation.  - Left atrium: The atrium was moderately dilated.  - Right ventricle: The cavity size was normal. Wall thickness was    normal. Systolic function was normal.  - Right atrium: The atrium was mildly dilated.  - Atrial septum: No defect or patent foramen ovale was identified.  - Tricuspid  valve: There was no regurgitation.    EKG:  EKG is ordered today.  The ekg ordered today demonstrates NSR, 82bpm, LAD, LVH, RBBB,   Recent Labs: 02/06/2023: B Natriuretic Peptide 1,008.1 02/07/2023: Magnesium 2.0 02/12/2023: Hemoglobin 11.7; Platelets 210 03/21/2023: BUN 28; Creatinine, Ser 1.26; Potassium 5.0; Sodium 137  Recent Lipid Panel    Component Value Date/Time   CHOL 126 02/07/2023 0440   TRIG 49 02/07/2023 0440   HDL 77 02/07/2023 0440   CHOLHDL 1.6 02/07/2023 0440   VLDL 10 02/07/2023 0440   LDLCALC 39 02/07/2023 0440     Physical Exam:    VS:  BP (!) 100/58   Pulse 82   Ht 5\' 7"  (1.702 m)   Wt 112 lb (50.8 kg)   SpO2 97%   BMI 17.54 kg/m     Wt Readings from Last 3 Encounters:  03/21/23 112 lb (50.8 kg)  02/18/23 124 lb (56.2 kg)  02/11/23 127 lb 11.2 oz (57.9 kg)     GEN:  Well nourished, well developed in no acute distress HEENT: Normal NECK: No JVD; No carotid bruits LYMPHATICS: No lymphadenopathy CARDIAC: RRR, + murmur, no rubs, gallops RESPIRATORY:  decreased on the left  ABDOMEN: Soft, non-tender, non-distended MUSCULOSKELETAL:  No edema; No deformity  SKIN: Warm and dry NEUROLOGIC:  Alert and oriented x 3 PSYCHIATRIC:  Normal affect   ASSESSMENT:    1. Coronary artery disease  involving native coronary artery of native heart without angina pectoris   2. Myocardial injury   3. Chronic systolic heart failure   4. Essential hypertension   5. Hyperlipidemia, mixed    PLAN:    In order of problems listed above:  CAD s/p remote CABG Recently treated for NSTEMI conservatively with IV heparin x 48 hours. Echo showed reduced EF. No chest pain reported.  Continue Aspirin, Lipitor and Coreg. Long discussion regarding medical management vs ischemic testing.  For now, plan to treat conservatively given age, lack of symptoms, functional status and CKD. We will discuss more with the daughter. No cardiac referral since he is doing PT at home.   Chronic Systolic and diastolic Heart failure Echo during recent hospitalization showed LVEF 35-40%, G2DD. Patient is euvolemic on exam. Breath sounds decreased on the left. He is on lasix 40mg  daily. BMET today. Plan as above. Continue Losartan and Coreg. Unable to titrate GDMT given low BP.  HTN BP soft, continue current medications  HLD LDL 39, continue Lipitor 40mg  daily.   Disposition: Follow up in 2 month(s) with MD    Signed, Peter Becker David Stall, PA-C  03/21/2023 4:44 PM    [facility]

## 2023-04-01 ENCOUNTER — Encounter: Payer: Medicare Other | Admitting: Family

## 2023-04-01 ENCOUNTER — Telehealth: Payer: Self-pay | Admitting: Family

## 2023-04-01 ENCOUNTER — Other Ambulatory Visit (HOSPITAL_COMMUNITY): Payer: Self-pay

## 2023-04-01 NOTE — Progress Notes (Deleted)
Patient ID: Peter Becker, male    DOB: 15-Dec-1928, 89 y.o.   MRN: [id number]  Primary cardiologist: Terrilee Croak, NP (last seen 03/21/03) PCP: Danella Penton, MD (last seen 03/18/23)  HPI  Peter Becker is a 89 y/o male with a history of CAD (CABG) DM, HTN, CKD, PVD, previous tobacco use and chronic heart failure.   Echo 02/09/23: EF 35-40%, moderate LVH, mild/ moderate Peter, moderate TR  Admitted 02/06/23 due to SOB d/t heart failure exacerbation. IV diuresed. Thoracentesis of the left pleural effusion.   He presents today for a HF follow-up visit with a chief complaint of   Past Medical History:  Diagnosis Date   CHF (congestive heart failure) (HCC)    Chronic kidney disease    Coronary artery disease    Diabetes mellitus without complication (HCC)    Hypertension    Myocardial infarct (HCC)    PVD (peripheral vascular disease) (HCC)    Past Surgical History:  Procedure Laterality Date   CARPAL TUNNEL RELEASE Left    CHOLECYSTECTOMY     EYE SURGERY     heart stent     triple bypass   HEMORRHOID SURGERY     TONSILLECTOMY     Family History  Problem Relation Age of Onset   Heart attack Father    Social History   Tobacco Use   Smoking status: Former   Smokeless tobacco: Never  Substance Use Topics   Alcohol use: No   Allergies  Allergen Reactions   Tape Other (See Comments)    SKIN IS VERY THIN AND TEARS AND BRUISES EASILY; Please use an alternative!!     Review of Systems  Constitutional:  Positive for fatigue. Negative for appetite change.  HENT:  Negative for congestion, postnasal drip and sore throat.   Eyes: Negative.   Respiratory:  Negative for cough, chest tightness and shortness of breath.   Cardiovascular:  Positive for leg swelling (improving). Negative for chest pain and palpitations.  Gastrointestinal:  Negative for abdominal distention and abdominal pain.  Endocrine: Negative.   Genitourinary: Negative.   Musculoskeletal:  Negative for back pain and  neck pain.  Skin: Negative.   Allergic/Immunologic: Negative.   Neurological:  Positive for dizziness (when bending over at the waist). Negative for light-headedness.  Hematological:  Negative for adenopathy. Does not bruise/bleed easily.  Psychiatric/Behavioral:  Negative for dysphoric mood and sleep disturbance. The patient is not nervous/anxious.      Physical Exam Vitals and nursing note reviewed. Exam conducted with a chaperone present (SIL).  Constitutional:      Appearance: Normal appearance.  HENT:     Head: Normocephalic and atraumatic.  Cardiovascular:     Rate and Rhythm: Normal rate.  Pulmonary:     Effort: Pulmonary effort is normal. No respiratory distress.     Breath sounds: No wheezing or rales.  Abdominal:     General: Abdomen is flat. There is no distension.     Palpations: Abdomen is soft.     Tenderness: There is no abdominal tenderness.  Musculoskeletal:        General: No tenderness.     Cervical back: Neck supple.     Right lower leg: Edema (1+ pitting) present.     Left lower leg: Edema (1+ pitting) present.  Skin:    General: Skin is warm and dry.  Neurological:     General: No focal deficit present.     Mental Status: He is alert and oriented to  person, place, and time.  Psychiatric:        Mood and Affect: Mood normal.        Behavior: Behavior normal.        Thought Content: Thought content normal.    Assessment & Plan:  1: Chronic heart failure with reduced ejection fraction- - NYHA class II - weighing daily; reminded to call for an overnight weight gain of > 2 pounds or a weekly weight gain of > 5 pounds - weight 124 pounds from last visit here 6 weeks ago - echo 02/09/23: EF 35-40%, moderate LVH, mild/ moderate Peter, moderate TR - etiology likely CAD (CABG 2007) - continue carvedilol 3.125mg  BID - continue furosemide 40mg  daily - continue losartan 12.5mg  daily - not adding salt and buying NAS canned vegs - get compression socks and put  them on every morning with removal at bedtime - BNP 02/06/23 was 1008.1  2: HTN w/ CKD- - BP  - saw PCP Peter Becker) 03/18/23 - BMP 03/21/23 showed sodium 137, potassium 5.0, creatinine 1.26 & GFR 54  3: DM- - A1c 02/06/23 was 11.3% - glimepiride 4mg  daily  4: CAD- - CABG X3 in 2007 - saw cardiology Peter Becker) 03/21/23

## 2023-04-01 NOTE — Telephone Encounter (Signed)
Patient did not show for his [facility] appointment on 04/01/23.

## 2023-05-13 ENCOUNTER — Telehealth: Payer: Self-pay | Admitting: Medical

## 2023-05-13 NOTE — Telephone Encounter (Signed)
Provider is out of office on 6/24 and appt needs to be rescheduled. Unable to leave voice mail due to mailbox being full.

## 2023-05-15 NOTE — Telephone Encounter (Signed)
Unable to leave voicemail, mailbox is full 

## 2023-05-21 NOTE — Telephone Encounter (Signed)
*  Patient was called 3x, no answer. Left voice mail on daughter's cell.

## 2023-05-26 ENCOUNTER — Encounter: Payer: Self-pay | Admitting: Medical

## 2023-05-26 ENCOUNTER — Ambulatory Visit: Payer: Medicare Other | Attending: Medical | Admitting: Medical

## 2023-05-26 VITALS — BP 124/66 | HR 86 | Ht 67.0 in | Wt 116.0 lb

## 2023-05-26 DIAGNOSIS — E782 Mixed hyperlipidemia: Secondary | ICD-10-CM | POA: Diagnosis not present

## 2023-05-26 DIAGNOSIS — Z79899 Other long term (current) drug therapy: Secondary | ICD-10-CM | POA: Insufficient documentation

## 2023-05-26 DIAGNOSIS — I2510 Atherosclerotic heart disease of native coronary artery without angina pectoris: Secondary | ICD-10-CM | POA: Insufficient documentation

## 2023-05-26 DIAGNOSIS — I10 Essential (primary) hypertension: Secondary | ICD-10-CM | POA: Diagnosis present

## 2023-05-26 MED ORDER — FUROSEMIDE 20 MG PO TABS: 20.0000 mg | ORAL_TABLET | Freq: Every day | ORAL | 3 refills | Status: DC

## 2023-05-26 MED ORDER — DAPAGLIFLOZIN PROPANEDIOL 10 MG PO TABS: 10.0000 mg | ORAL_TABLET | Freq: Every day | ORAL | 3 refills | Status: DC

## 2023-05-26 NOTE — Patient Instructions (Signed)
Medication Instructions:  Your physician recommends the following medication changes.  START TAKING: Farxiga 10 mg by mouth daily   DECREASE: Lasix to 20 mg by mouth daily  *If you need a refill on your cardiac medications before your next appointment, please call your pharmacy*   Lab Work: Your provider would like for you to return in 2 weeks to have the following labs drawn: (BMP).   Please go to the [facility] entrance and check in at the front desk.  You do not need an appointment.  They are open from 7am-6 pm.  You will not need to be fasting.   If you have labs (blood work) drawn today and your tests are completely normal, you will receive your results only by: MyChart Message (if you have MyChart) OR A paper copy in the mail If you have any lab test that is abnormal or we need to change your treatment, we will call you to review the results.   Testing/Procedures: Your provider has ordered a Lexiscan/ Exercise Myoview Stress test. This will take place at [facility]. Please report to the [facility] medical mall entrance. The volunteers at the first desk will direct you where to go.  [facility] MYOVIEW  Your provider has ordered a Stress Test with nuclear imaging. The purpose of this test is to evaluate the blood supply to your heart muscle. This procedure is referred to as a "Non-Invasive Stress Test." This is because other than having an IV started in your vein, nothing is inserted or "invades" your body. Cardiac stress tests are done to find areas of poor blood flow to the heart by determining the extent of coronary artery disease (CAD). Some patients exercise on a treadmill, which naturally increases the blood flow to your heart, while others who are unable to walk on a treadmill due to physical limitations will have a pharmacologic/chemical stress agent called Lexiscan . This medicine will mimic walking on a treadmill by temporarily increasing your coronary blood flow.   Please note:  these test may take anywhere between 2-4 hours to complete  How to prepare for your Myoview test:  Nothing to eat for 6 hours prior to the test No caffeine for 24 hours prior to test No smoking 24 hours prior to test. Your medication may be taken with water.  If your doctor stopped a medication because of this test, do not take that medication. Ladies, please do not wear dresses.  Skirts or pants are appropriate. Please wear a short sleeve shirt. No perfume, cologne or lotion. Wear comfortable walking shoes. No heels!   PLEASE NOTIFY THE OFFICE AT LEAST 24 HOURS IN ADVANCE IF YOU ARE UNABLE TO KEEP YOUR APPOINTMENT.  [phone] AND  PLEASE NOTIFY NUCLEAR MEDICINE AT [facility] AT LEAST 24 HOURS IN ADVANCE IF YOU ARE UNABLE TO KEEP YOUR APPOINTMENT. [phone]    Follow-Up: At [facility], you and your health needs are our priority.  As part of our continuing mission to provide you with exceptional heart care, we have created designated Provider Care Teams.  These Care Teams include your primary Cardiologist (physician) and Advanced Practice Providers (APPs -  Physician Assistants and Nurse Practitioners) who all work together to provide you with the care you need, when you need it.  We recommend signing up for the patient portal called "MyChart".  Sign up information is provided on this After Visit Summary.  MyChart is used to connect with patients for Virtual Visits (Telemedicine).  Patients are able  to view lab/test results, encounter notes, upcoming appointments, etc.  Non-urgent messages can be sent to your provider as well.   To learn more about what you can do with MyChart, go to [email or web address].    Your next appointment:   1 month(s)  Provider:   Terrilee Croak, PA-C

## 2023-05-26 NOTE — Progress Notes (Signed)
Cardiology Office Note:    Date:  05/26/2023   ID:  Peter Becker, DOB Nov 29, 1929, MRN [id number]  PCP:  Danella Penton, MD  [facility] Cardiologist:  None  [facility] Electrophysiologist:  None   Referring MD: Danella Penton, MD   Chief Complaint: 2 month follow-up  History of Present Illness:    Peter Becker is a 89 y.o. male with a hx of HFrEF, hypertension, hyperlipidemia, type 2 diabetes, coronary artery disease status post CABG x 3 in 2007, peripheral vascular disease, CVA in 2018, pernicious anemia, anxiety, CKD stage III who is being seen for 2 month follow-up.   Patient was admitted March 2024 with shortness of breath not responsive to outpatient treatment.  Troponin was elevated at 205, BNP greater than 1000.  Patient was started on IV Lasix and IV heparin.  Echo showed EF of 35 to 40%.  Patient underwent thoracentesis of the left pleural effusion that did not show any infection. Patient was transitioned to GDMT for CHF. Plan not to pursue further ischemic testing given age and other comormidities.  Patient was last seen 03/2023 and was doing OK. Medical management was continued.   Today, the patient reports he is overall doing well. He uses a cane at home. He denies chest pain or SOB. No lower leg edema. Blood pressure is better today.   Past Medical History:  Diagnosis Date   CHF (congestive heart failure) (HCC)    Chronic kidney disease    Coronary artery disease    Diabetes mellitus without complication (HCC)    Hypertension    Myocardial infarct (HCC)    PVD (peripheral vascular disease) (HCC)     Past Surgical History:  Procedure Laterality Date   CARPAL TUNNEL RELEASE Left    CHOLECYSTECTOMY     EYE SURGERY     heart stent     triple bypass   HEMORRHOID SURGERY     TONSILLECTOMY      Current Medications: Current Meds  Medication Sig   acetaminophen (TYLENOL) 325 MG tablet Take 2 tablets (650 mg total) by mouth every 6 (six) hours as needed for  mild pain or fever.   aspirin EC 81 MG tablet Take 1 tablet (81 mg total) by mouth daily. Swallow whole.   atorvastatin (LIPITOR) 40 MG tablet Take 1 tablet (40 mg total) by mouth daily.   carvedilol (COREG) 3.125 MG tablet Take 1 tablet (3.125 mg total) by mouth 2 (two) times daily with a meal.   cyanocobalamin (,VITAMIN B-12,) 1000 MCG/ML injection INJ 1 ML IM Q 14 DAYS   dapagliflozin propanediol (FARXIGA) 10 MG TABS tablet Take 1 tablet (10 mg total) by mouth daily before breakfast.   glimepiride (AMARYL) 4 MG tablet Take 4 mg by mouth daily with breakfast.   losartan (COZAAR) 25 MG tablet Take 0.5 tablets (12.5 mg total) by mouth daily.   pioglitazone (ACTOS) 15 MG tablet Take 15 mg by mouth daily.   tamsulosin (FLOMAX) 0.4 MG CAPS capsule Take 0.4 mg by mouth daily.   [DISCONTINUED] furosemide (LASIX) 40 MG tablet Take 20 mg by mouth daily.     Allergies:   Tape   Social History   Socioeconomic History   Marital status: Widowed    Spouse name: Not on file   Number of children: Not on file   Years of education: Not on file   Highest education level: Not on file  Occupational History   Not on file  Tobacco  Use   Smoking status: Former   Smokeless tobacco: Never  Building services engineer Use: Never used  Substance and Sexual Activity   Alcohol use: No   Drug use: No   Sexual activity: Not on file  Other Topics Concern   Not on file  Social History Narrative   Not on file   Social Determinants of Health   Financial Resource Strain: Not on file  Food Insecurity: No Food Insecurity (02/07/2023)   Hunger Vital Sign    Worried About Running Out of Food in the Last Year: Never true    Ran Out of Food in the Last Year: Never true  Transportation Needs: No Transportation Needs (02/07/2023)   PRAPARE - Administrator, Civil Service (Medical): No    Lack of Transportation (Non-Medical): No  Physical Activity: Not on file  Stress: Not on file  Social Connections: Not on  file     Family History: The patient's family history includes Heart attack in his father.  ROS:   Please see the history of present illness.     All other systems reviewed and are negative.  EKGs/Labs/Other Studies Reviewed:    The following studies were reviewed today:   Echo 01/2023 1. Left ventricular ejection fraction, by estimation, is 35 to 40%. Left  ventricular ejection fraction by 2D MOD biplane is 45.9 %. The left  ventricle has moderately decreased function. The left ventricle  demonstrates regional wall motion abnormalities  (severe hypokinesis of the inferior/posterior wall, akinesis of the apical  and peri-apical region). No apical thrombus noted. There is moderate  asymmetric left ventricular hypertrophy of the basal-septal segment. Left  ventricular diastolic parameters are   consistent with Grade II diastolic dysfunction (pseudonormalization).   2. Right ventricular systolic function is mildly reduced. The right  ventricular size is moderately enlarged. There is normal pulmonary artery  systolic pressure. The estimated right ventricular systolic pressure is  30.9 mmHg.   3. The mitral valve is normal in structure. Mild to moderate mitral valve  regurgitation. No evidence of mitral stenosis.   4. Tricuspid valve regurgitation is moderate.   5. The aortic valve has an indeterminant number of cusps. There is mild  calcification of the aortic valve. Aortic valve regurgitation is not  visualized. Aortic valve sclerosis is present, with no evidence of aortic  valve stenosis.   6. The inferior vena cava is normal in size with greater than 50%  respiratory variability, suggesting right atrial pressure of 3 mmHg.    Echo 11/2017 Study Conclusions   - Left ventricle: The cavity size was normal. Systolic function was    normal. The estimated ejection fraction was in the range of 50%    to 55%. Hypokinesis of the apical myocardium. Doppler parameters    are  consistent with abnormal left ventricular relaxation (grade 1    diastolic dysfunction). Doppler parameters are consistent with    high ventricular filling pressure.  - Aortic valve: Transvalvular velocity was within the normal range.    There was no stenosis. There was no regurgitation. Valve area    (VTI): 2.8 cm^2. Valve area (Vmax): 2.67 cm^2. Valve area    (Vmean): 2.38 cm^2.  - Mitral valve: Transvalvular velocity was within the normal range.    There was no evidence for stenosis. There was mild regurgitation.  - Left atrium: The atrium was moderately dilated.  - Right ventricle: The cavity size was normal. Wall thickness was  normal. Systolic function was normal.  - Right atrium: The atrium was mildly dilated.  - Atrial septum: No defect or patent foramen ovale was identified.  - Tricuspid valve: There was no regurgitation.     EKG:  EKG is not ordered today.   Recent Labs: 02/06/2023: B Natriuretic Peptide 1,008.1 02/07/2023: Magnesium 2.0 02/12/2023: Hemoglobin 11.7; Platelets 210 03/21/2023: BUN 28; Creatinine, Ser 1.26; Potassium 5.0; Sodium 137  Recent Lipid Panel    Component Value Date/Time   CHOL 126 02/07/2023 0440   TRIG 49 02/07/2023 0440   HDL 77 02/07/2023 0440   CHOLHDL 1.6 02/07/2023 0440   VLDL 10 02/07/2023 0440   LDLCALC 39 02/07/2023 0440     Physical Exam:    VS:  BP 124/66 (BP Location: Right Arm, Patient Position: Sitting, Cuff Size: Normal)   Pulse 86   Ht 5\' 7"  (1.702 m)   Wt 116 lb (52.6 kg)   SpO2 96%   BMI 18.17 kg/m     Wt Readings from Last 3 Encounters:  05/26/23 116 lb (52.6 kg)  03/21/23 112 lb (50.8 kg)  02/18/23 124 lb (56.2 kg)     GEN:  Well nourished, well developed in no acute distress HEENT: Normal NECK: No JVD; No carotid bruits LYMPHATICS: No lymphadenopathy CARDIAC: RRR, no murmurs, rubs, gallops RESPIRATORY:  Clear to auscultation without rales, wheezing or rhonchi  ABDOMEN: Soft, non-tender,  non-distended MUSCULOSKELETAL:  No edema; No deformity  SKIN: Warm and dry NEUROLOGIC:  Alert and oriented x 3 PSYCHIATRIC:  Normal affect   ASSESSMENT:    1. Coronary artery disease involving native coronary artery of native heart without angina pectoris   2. Medication management   3. Essential hypertension   4. Hyperlipidemia, mixed    PLAN:    In order of problems listed above:  CAD s/p remote CABG Patient denies chest pain or SOB. Found to have low EF during hospitalization 01/2023. Patient is asymptomatic and not very functional at baseline. He also has underlying CKD. We will order a Myoview lexiscan to evaluate for new ischemia, and they will call if they want to cancel. Continue Aspirin 81mg daily, Coreg 3.125mg BID, Lipitor 40mg  daily.   Chronic systolic and diastolic heart failure LVEF 35-40% in echo 01/2023. He is euvolemic on exam. Continue Coreg 3.125mg  BID, Losartan 12.5mg  daily. I will add Farxiga 10mg  daily. I will decrease lasix to 20mg  daily. BMET in 2 weeks. Continue GDMT at follow-up.   HTN BP normal, continue current medications.  HLD LDL 39, continue Lipitor 40mg  daily.  Disposition: Follow up in 1 month(s) with MD/APP   Shared Decision Making/Informed Consent   Informed Consent   Shared Decision Making/Informed Consent The risks [chest pain, shortness of breath, cardiac arrhythmias, dizziness, blood pressure fluctuations, myocardial infarction, stroke/transient ischemic attack, nausea, vomiting, allergic reaction, radiation exposure, metallic taste sensation and life-threatening complications (estimated to be 1 in 10,000)], benefits (risk stratification, diagnosing coronary artery disease, treatment guidance) and alternatives of a nuclear stress test were discussed in detail with Mr. Kallam and he agrees to proceed.      Signed, Samie Barclift David Stall, PA-C  05/26/2023 4:16 PM    [facility]

## 2023-06-02 ENCOUNTER — Ambulatory Visit: Payer: Medicare Other

## 2023-06-09 ENCOUNTER — Other Ambulatory Visit (HOSPITAL_COMMUNITY): Payer: Self-pay

## 2023-07-02 ENCOUNTER — Ambulatory Visit: Payer: Medicare Other | Attending: Medical | Admitting: Medical

## 2023-07-02 NOTE — Progress Notes (Deleted)
Cardiology Office Note:    Date:  07/02/2023   ID:  Peter Becker, DOB 1929/07/20, MRN [id number]  PCP:  Danella Penton, MD  [facility] Cardiologist:  None  [facility] Electrophysiologist:  None   Referring MD: Danella Penton, MD   Chief Complaint: 1 year follow-up  History of Present Illness:    Peter Becker is a 89 y.o. male with a hx of HFrEF, hypertension, hyperlipidemia, type 2 diabetes, coronary artery disease status post CABG x 3 in 2007, peripheral vascular disease, CVA in 2018, pernicious anemia, anxiety, CKD stage III who is being seen for 2 month follow-up.   Patient was admitted March 2024 with shortness of breath not responsive to outpatient treatment.  Troponin was elevated at 205, BNP greater than 1000.  Patient was started on IV Lasix and IV heparin.  Echo showed EF of 35 to 40%.  Patient underwent thoracentesis of the left pleural effusion that did not show any infection. Patient was transitioned to GDMT for CHF. Plan not to pursue further ischemic testing given age and other comormidities.  Patient was last seen in June 2024 and patient was set up for Myoview Lexiscan to evaluate low EF, however due to age and comorbidities, this scan was canceled.  Today,   Past Medical History:  Diagnosis Date   CHF (congestive heart failure) (HCC)    Chronic kidney disease    Coronary artery disease    Diabetes mellitus without complication (HCC)    Hypertension    Myocardial infarct (HCC)    PVD (peripheral vascular disease) (HCC)     Past Surgical History:  Procedure Laterality Date   CARPAL TUNNEL RELEASE Left    CHOLECYSTECTOMY     EYE SURGERY     heart stent     triple bypass   HEMORRHOID SURGERY     TONSILLECTOMY      Current Medications: No outpatient medications have been marked as taking for the 07/02/23 encounter (Appointment) with Fransico Michael,  H, PA-C.     Allergies:   Tape   Social History   Socioeconomic History   Marital status:  Widowed    Spouse name: Not on file   Number of children: Not on file   Years of education: Not on file   Highest education level: Not on file  Occupational History   Not on file  Tobacco Use   Smoking status: Former   Smokeless tobacco: Never  Vaping Use   Vaping status: Never Used  Substance and Sexual Activity   Alcohol use: No   Drug use: No   Sexual activity: Not on file  Other Topics Concern   Not on file  Social History Narrative   Not on file   Social Determinants of Health   Financial Resource Strain: Not on file  Food Insecurity: No Food Insecurity (02/07/2023)   Hunger Vital Sign    Worried About Running Out of Food in the Last Year: Never true    Ran Out of Food in the Last Year: Never true  Transportation Needs: No Transportation Needs (02/07/2023)   PRAPARE - Administrator, Civil Service (Medical): No    Lack of Transportation (Non-Medical): No  Physical Activity: Not on file  Stress: Not on file  Social Connections: Not on file     Family History: The patient's ***family history includes Heart attack in his father.  ROS:   Please see the history of present illness.    ***  All other systems reviewed and are negative.  EKGs/Labs/Other Studies Reviewed:    The following studies were reviewed today: ***  EKG:  EKG is *** ordered today.  The ekg ordered today demonstrates ***  Recent Labs: 02/06/2023: B Natriuretic Peptide 1,008.1 02/07/2023: Magnesium 2.0 02/12/2023: Hemoglobin 11.7; Platelets 210 03/21/2023: BUN 28; Creatinine, Ser 1.26; Potassium 5.0; Sodium 137  Recent Lipid Panel    Component Value Date/Time   CHOL 126 02/07/2023 0440   TRIG 49 02/07/2023 0440   HDL 77 02/07/2023 0440   CHOLHDL 1.6 02/07/2023 0440   VLDL 10 02/07/2023 0440   LDLCALC 39 02/07/2023 0440     Risk Assessment/Calculations:   {Does this patient have ATRIAL FIBRILLATION?[phone]}   Physical Exam:    VS:  There were no vitals taken for this visit.     Wt Readings from Last 3 Encounters:  05/26/23 116 lb (52.6 kg)  03/21/23 112 lb (50.8 kg)  02/18/23 124 lb (56.2 kg)     GEN: *** Well nourished, well developed in no acute distress HEENT: Normal NECK: No JVD; No carotid bruits LYMPHATICS: No lymphadenopathy CARDIAC: ***RRR, no murmurs, rubs, gallops RESPIRATORY:  Clear to auscultation without rales, wheezing or rhonchi  ABDOMEN: Soft, non-tender, non-distended MUSCULOSKELETAL:  No edema; No deformity  SKIN: Warm and dry NEUROLOGIC:  Alert and oriented x 3 PSYCHIATRIC:  Normal affect   ASSESSMENT:    No diagnosis found. PLAN:    In order of problems listed above:  ***  Disposition: Follow up {follow up:15908} with ***   Shared Decision Making/Informed Consent   {Are you ordering a CV Procedure (e.g. stress test, cath, DCCV, TEE, etc)?   Press F2        :[id number]}    Signed,  Ardelle Lesches  07/02/2023 8:27 AM    [facility]

## 2023-07-03 ENCOUNTER — Encounter: Payer: Self-pay | Admitting: Medical

## 2023-12-15 ENCOUNTER — Other Ambulatory Visit: Payer: Self-pay

## 2023-12-15 MED ORDER — FUROSEMIDE 20 MG PO TABS: 20.0000 mg | ORAL_TABLET | Freq: Every day | ORAL | 0 refills | Status: DC

## 2023-12-15 MED ORDER — DAPAGLIFLOZIN PROPANEDIOL 10 MG PO TABS: 10.0000 mg | ORAL_TABLET | Freq: Every day | ORAL | 0 refills | Status: DC

## 2024-02-03 ENCOUNTER — Emergency Department

## 2024-02-03 ENCOUNTER — Other Ambulatory Visit: Payer: Self-pay

## 2024-02-03 ENCOUNTER — Emergency Department
Admission: EM | Admit: 2024-02-03 | Discharge: 2024-02-03 | Disposition: A | Discharge status: Home / Self Care | Attending: Emergency Medicine | Admitting: Emergency Medicine

## 2024-02-03 DIAGNOSIS — E1165 Type 2 diabetes mellitus with hyperglycemia: Secondary | ICD-10-CM | POA: Diagnosis not present

## 2024-02-03 DIAGNOSIS — R531 Weakness: Secondary | ICD-10-CM

## 2024-02-03 DIAGNOSIS — I2510 Atherosclerotic heart disease of native coronary artery without angina pectoris: Secondary | ICD-10-CM | POA: Insufficient documentation

## 2024-02-03 DIAGNOSIS — R739 Hyperglycemia, unspecified: Secondary | ICD-10-CM

## 2024-02-03 DIAGNOSIS — Z951 Presence of aortocoronary bypass graft: Secondary | ICD-10-CM | POA: Insufficient documentation

## 2024-02-03 DIAGNOSIS — R079 Chest pain, unspecified: Secondary | ICD-10-CM | POA: Diagnosis present

## 2024-02-03 HISTORY — DX: Presence of aortocoronary bypass graft: Z95.1

## 2024-02-03 LAB — CBC
HCT: 41.7 % (ref 39.0–52.0)
Hemoglobin: 13.5 g/dL (ref 13.0–17.0)
MCH: 34 pg (ref 26.0–34.0)
MCHC: 32.4 g/dL (ref 30.0–36.0)
MCV: 105 fL — ABNORMAL HIGH (ref 80.0–100.0)
Platelets: 182 10*3/uL (ref 150–400)
RBC: 3.97 MIL/uL — ABNORMAL LOW (ref 4.22–5.81)
RDW: 13 % (ref 11.5–15.5)
WBC: 7.3 10*3/uL (ref 4.0–10.5)
nRBC: 0 % (ref 0.0–0.2)

## 2024-02-03 LAB — BASIC METABOLIC PANEL
Anion gap: 11 (ref 5–15)
BUN: 26 mg/dL — ABNORMAL HIGH (ref 8–23)
CO2: 27 mmol/L (ref 22–32)
Calcium: 9 mg/dL (ref 8.9–10.3)
Chloride: 101 mmol/L (ref 98–111)
Creatinine, Ser: 1.36 mg/dL — ABNORMAL HIGH (ref 0.61–1.24)
GFR, Estimated: 49 mL/min — ABNORMAL LOW (ref >60)
Glucose, Bld: 292 mg/dL — ABNORMAL HIGH (ref 70–99)
Potassium: 4.6 mmol/L (ref 3.5–5.1)
Sodium: 139 mmol/L (ref 135–145)

## 2024-02-03 LAB — TROPONIN I (HIGH SENSITIVITY)
Troponin I (High Sensitivity): 151 ng/L (ref <18)
Troponin I (High Sensitivity): 130 ng/L (ref <18)

## 2024-02-03 NOTE — ED Triage Notes (Signed)
 Pt to ED for chest pressure that started at 2pm today and lasted about 1 hour. States has resolved. Sent here from [facility]. Also cBG has been high. 172 today. EKG shows NSR. Pt in NAD, skin dry.

## 2024-02-03 NOTE — ED Provider Notes (Signed)
 [facility] Provider Note   Event Date/Time   First MD Initiated Contact with Patient 02/03/24 1731     (approximate) History  Chest Pain  HPI Peter Becker is a 89 y.o. male with past medical history of peripheral arterial disease, type 2 diabetes, and CAD status post three-vessel CABG presents complaining of acute onset weakness, chest pressure, and elevated glucose to 172.  Patient states that he was sitting in his recliner today when he had difficulty getting out of bed due to significant weakness.  Since that time patient has been ambulatory as usual with a 4 point walker.  Patient was sent from [facility] clinic for further evaluation.  Patient currently denies any complaints ROS: Patient currently denies any vision changes, tinnitus, difficulty speaking, facial droop, sore throat, shortness of breath, abdominal pain, nausea/vomiting/diarrhea, dysuria, or numbness/paresthesias in any extremity   Physical Exam  Triage Vital Signs: ED Triage Vitals  Encounter Vitals Group     BP 02/03/24 1611 129/82     Systolic BP Percentile --      Diastolic BP Percentile --      Pulse Rate 02/03/24 1611 79     Resp 02/03/24 1611 20     Temp 02/03/24 1611 97.8 F (36.6 C)     Temp Source 02/03/24 1611 Oral     SpO2 02/03/24 1611 94 %     Weight 02/03/24 1605 115 lb (52.2 kg)     Height 02/03/24 1605 5\' 8"  (1.727 m)     Head Circumference --      Peak Flow --      Pain Score 02/03/24 1606 0     Pain Loc --      Pain Education --      Exclude from Growth Chart --    Most recent vital signs: Vitals:   02/03/24 1611 02/03/24 1915  BP: 129/82 139/78  Pulse: 79 65  Resp: 20 18  Temp: 97.8 F (36.6 C) 98.3 F (36.8 C)  SpO2: 94% 92%   General: Awake, oriented x4. CV:  Good peripheral perfusion.  Resp:  Normal effort.  Abd:  No distention.  Other:  Elderly, well-developed, poorly nourished Caucasian male resting comfortably in no acute distress ED Results /  Procedures / Treatments  Labs (all labs ordered are listed, but only abnormal results are displayed) Labs Reviewed  BASIC METABOLIC PANEL - Abnormal; Notable for the following components:      Result Value   Glucose, Bld 292 (*)    BUN 26 (*)    Creatinine, Ser 1.36 (*)    GFR, Estimated 49 (*)    All other components within normal limits  CBC - Abnormal; Notable for the following components:   RBC 3.97 (*)    MCV 105.0 (*)    All other components within normal limits  TROPONIN I (HIGH SENSITIVITY) - Abnormal; Notable for the following components:   Troponin I (High Sensitivity) 151 (*)    All other components within normal limits  TROPONIN I (HIGH SENSITIVITY) - Abnormal; Notable for the following components:   Troponin I (High Sensitivity) 130 (*)    All other components within normal limits   EKG ED ECG REPORT I, Merwyn Katos, the attending physician, personally viewed and interpreted this ECG. Date: 02/03/2024 EKG Time: 1607 Rate: 82 Rhythm: normal sinus rhythm QRS Axis: normal Intervals: Nonspecific intraventricular block ST/T Wave abnormalities: normal Narrative Interpretation: Normal sinus rhythm with nonspecific intraventricular block.  No evidence  of acute ischemia RADIOLOGY ED MD interpretation: 2 view chest x-ray shows evidence of prior median sternotomy/CABG.  There is mild left basilar and mild to moderate severity right basilar atelectasis.  There is small bilateral pleural effusions -Agree with radiology assessment Official radiology report(s): DG Chest 2 View Result Date: 02/03/2024 CLINICAL DATA:  Chest pressure. EXAM: CHEST - 2 VIEW COMPARISON:  February 12, 2023 FINDINGS: Multiple sternal wires and vascular clips are noted. The heart size and mediastinal contours are within normal limits. Mild, diffuse, chronic appearing increased interstitial lung markings are seen with mild left basilar and mild to moderate severity right basilar atelectasis and/or infiltrate.  Stable subcentimeter calcified lung nodules are seen within the left upper lobe. Small bilateral pleural effusions are also seen. No pneumothorax is identified. Radiopaque surgical clips are seen within the right upper quadrant. Multilevel degenerative changes are seen throughout the thoracic spine. IMPRESSION: 1. Evidence of prior median sternotomy/CABG. 2. Mild left basilar and mild to moderate severity right basilar atelectasis and/or infiltrate. 3. Small bilateral pleural effusions. Electronically Signed   By: Aram Candela M.D.   On: 02/03/2024 18:57   PROCEDURES: Critical Care performed: No Procedures MEDICATIONS ORDERED IN ED: Medications - No data to display IMPRESSION / MDM / ASSESSMENT AND PLAN / ED COURSE  I reviewed the triage vital signs and the nursing notes.                             The patient is on the cardiac monitor to evaluate for evidence of arrhythmia and/or significant heart rate changes. Patient's presentation is most consistent with acute presentation with potential threat to life or bodily function. Patient's presentation most consistent with hyperglycemic state WITHOUT evidence of DKA. Given Exam, History, and Workup I have low suspicion for an emergent precipitating factor of this hyperglycemic state such as atypical MI, acute abdomen, or other serious bacterial illness. Patient is Type 2 Diabetic with changes in medication regimen/adherence.  Findings: Patient without AGAP or significant ketones in urine to suggest DKA  Re-evaluation: Patient is showed no further signs of weakness, patient's troponin has down trended and is lower than previous troponin levels.  Patient agrees to follow-up with primary care provider as well as cardiology as needed  Disposition: Discharge home with appropriate insulin regimen and prompt PCP follow up instructions.   FINAL CLINICAL IMPRESSION(S) / ED DIAGNOSES   Final diagnoses:  Episode of generalized weakness   Hyperglycemia   Rx / DC Orders   ED Discharge Orders     None      Note:  This document was prepared using Dragon voice recognition software and may include unintentional dictation errors.   Merwyn Katos, MD 02/03/24 [phone]

## 2024-02-08 ENCOUNTER — Emergency Department
Admission: EM | Admit: 2024-02-08 | Discharge: 2024-02-08 | Disposition: A | Discharge status: Home / Self Care | Attending: Emergency Medicine | Admitting: Emergency Medicine

## 2024-02-08 DIAGNOSIS — Z7984 Long term (current) use of oral hypoglycemic drugs: Secondary | ICD-10-CM | POA: Insufficient documentation

## 2024-02-08 DIAGNOSIS — I130 Hypertensive heart and chronic kidney disease with heart failure and stage 1 through stage 4 chronic kidney disease, or unspecified chronic kidney disease: Secondary | ICD-10-CM | POA: Diagnosis not present

## 2024-02-08 DIAGNOSIS — R739 Hyperglycemia, unspecified: Secondary | ICD-10-CM

## 2024-02-08 DIAGNOSIS — E1165 Type 2 diabetes mellitus with hyperglycemia: Secondary | ICD-10-CM | POA: Insufficient documentation

## 2024-02-08 DIAGNOSIS — R0602 Shortness of breath: Secondary | ICD-10-CM | POA: Diagnosis not present

## 2024-02-08 LAB — BASIC METABOLIC PANEL
Anion gap: 8 (ref 5–15)
BUN: 30 mg/dL — ABNORMAL HIGH (ref 8–23)
CO2: 30 mmol/L (ref 22–32)
Calcium: 8.9 mg/dL (ref 8.9–10.3)
Chloride: 104 mmol/L (ref 98–111)
Creatinine, Ser: 1.21 mg/dL (ref 0.61–1.24)
GFR, Estimated: 56 mL/min — ABNORMAL LOW (ref >60)
Glucose, Bld: 276 mg/dL — ABNORMAL HIGH (ref 70–99)
Potassium: 4.7 mmol/L (ref 3.5–5.1)
Sodium: 142 mmol/L (ref 135–145)

## 2024-02-08 LAB — URINALYSIS, ROUTINE W REFLEX MICROSCOPIC
Bacteria, UA: NONE SEEN
Bilirubin Urine: NEGATIVE
Glucose, UA: >=500 mg/dL — AB
Hgb urine dipstick: NEGATIVE
Ketones, ur: NEGATIVE mg/dL
Leukocytes,Ua: NEGATIVE
Nitrite: NEGATIVE
Protein, ur: 100 mg/dL — AB
Specific Gravity, Urine: 1.026 (ref 1.005–1.030)
Squamous Epithelial / HPF: 0 /HPF (ref 0–5)
pH: 5 (ref 5.0–8.0)

## 2024-02-08 LAB — CBC
HCT: 41.1 % (ref 39.0–52.0)
Hemoglobin: 13 g/dL (ref 13.0–17.0)
MCH: 33.3 pg (ref 26.0–34.0)
MCHC: 31.6 g/dL (ref 30.0–36.0)
MCV: 105.4 fL — ABNORMAL HIGH (ref 80.0–100.0)
Platelets: 180 10*3/uL (ref 150–400)
RBC: 3.9 MIL/uL — ABNORMAL LOW (ref 4.22–5.81)
RDW: 13 % (ref 11.5–15.5)
WBC: 6.2 10*3/uL (ref 4.0–10.5)
nRBC: 0 % (ref 0.0–0.2)

## 2024-02-08 LAB — CBG MONITORING, ED: Glucose-Capillary: 235 mg/dL — ABNORMAL HIGH (ref 70–99)

## 2024-02-08 MED ORDER — SODIUM CHLORIDE 0.9 % IV BOLUS
500.0000 mL | Freq: Once | INTRAVENOUS | Status: AC
  Administered 2024-02-08: 500 mL via INTRAVENOUS

## 2024-02-08 NOTE — ED Provider Notes (Signed)
 [facility] Provider Note    Event Date/Time   First MD Initiated Contact with Patient 02/08/24 1944     (approximate)   History   Hyperglycemia   HPI  Peter Becker is a 89 y.o. male who presents to the emergency department today because of concerns for high blood sugars.  Patient does have a history of diabetes.  Currently taking oral medication.  Says for the past few weeks they have been reading in the 300-500 range.  Per chart review he normally runs in the 200s.  He states that he has been feeling weak because of this.  He apparently has talked to his doctor is working on having him follow-up with endocrinology.  Patient denies any focal illness.     Physical Exam   Triage Vital Signs: ED Triage Vitals  Encounter Vitals Group     BP 02/08/24 1907 (!) 151/86     Systolic BP Percentile --      Diastolic BP Percentile --      Pulse Rate 02/08/24 1907 87     Resp 02/08/24 1907 16     Temp 02/08/24 1907 98.2 F (36.8 C)     Temp Source 02/08/24 1907 Oral     SpO2 02/08/24 1907 94 %     Weight 02/08/24 1908 115 lb (52.2 kg)     Height 02/08/24 1908 5\' 8"  (1.727 m)     Head Circumference --      Peak Flow --      Pain Score 02/08/24 1908 0     Pain Loc --      Pain Education --      Exclude from Growth Chart --     Most recent vital signs: Vitals:   02/08/24 1907  BP: (!) 151/86  Pulse: 87  Resp: 16  Temp: 98.2 F (36.8 C)  SpO2: 94%   General: Awake, alert, oriented. CV:  Good peripheral perfusion. Regular rate and rhythm. Resp:  Normal effort. Lungs clear. Abd:  No distention.    ED Results / Procedures / Treatments   Labs (all labs ordered are listed, but only abnormal results are displayed) Labs Reviewed  BASIC METABOLIC PANEL - Abnormal; Notable for the following components:      Result Value   Glucose, Bld 276 (*)    BUN 30 (*)    GFR, Estimated 56 (*)    All other components within normal limits  CBC - Abnormal;  Notable for the following components:   RBC 3.90 (*)    MCV 105.4 (*)    All other components within normal limits  URINALYSIS, ROUTINE W REFLEX MICROSCOPIC - Abnormal; Notable for the following components:   Color, Urine YELLOW (*)    APPearance CLEAR (*)    Glucose, UA >=500 (*)    Protein, ur 100 (*)    All other components within normal limits  CBG MONITORING, ED - Abnormal; Notable for the following components:   Glucose-Capillary 235 (*)    All other components within normal limits     EKG  None   RADIOLOGY None    PROCEDURES:  Critical Care performed: No    MEDICATIONS ORDERED IN ED: Medications - No data to display   IMPRESSION / MDM / ASSESSMENT AND PLAN / ED COURSE  I reviewed the triage vital signs and the nursing notes.  Differential diagnosis includes, but is not limited to, hyperglycemia, DKA, infection  Patient's presentation is most consistent with acute presentation with potential threat to life or bodily function.   Patient presented to the emergency department today because of concerns for elevated blood sugar.  On exam patient is awake and alert.  No focal findings.  Blood work here does show elevated blood sugar however only in the 200s.  No findings concerning for DKA. UA without findings concerning for infection. Patient was given IV fluids.  Did discuss with patient importance of following up with primary care and endocrinology.     FINAL CLINICAL IMPRESSION(S) / ED DIAGNOSES   Final diagnoses:  Hyperglycemia     Note:  This document was prepared using Dragon voice recognition software and may include unintentional dictation errors.    Phineas Semen, MD 02/08/24 2204

## 2024-02-08 NOTE — ED Triage Notes (Signed)
 Pt arrived POV for elevated blood sugar readings 300-400 since this evening. Pt does not have any insulin coverage. Pt denies abd pain, no n/v or other aliments. A&O at baseline.

## 2024-02-10 ENCOUNTER — Inpatient Hospital Stay
Admission: EM | Admit: 2024-02-10 | Discharge: 2024-02-17 | DRG: 291 | Disposition: A | Discharge status: Hospice | Attending: Obstetrics and Gynecology | Admitting: Obstetrics and Gynecology

## 2024-02-10 ENCOUNTER — Emergency Department

## 2024-02-10 ENCOUNTER — Other Ambulatory Visit: Payer: Self-pay

## 2024-02-10 DIAGNOSIS — R1310 Dysphagia, unspecified: Secondary | ICD-10-CM | POA: Diagnosis present

## 2024-02-10 DIAGNOSIS — N400 Enlarged prostate without lower urinary tract symptoms: Secondary | ICD-10-CM | POA: Diagnosis present

## 2024-02-10 DIAGNOSIS — E785 Hyperlipidemia, unspecified: Secondary | ICD-10-CM | POA: Diagnosis present

## 2024-02-10 DIAGNOSIS — I5A Non-ischemic myocardial injury (non-traumatic): Secondary | ICD-10-CM | POA: Diagnosis not present

## 2024-02-10 DIAGNOSIS — I252 Old myocardial infarction: Secondary | ICD-10-CM

## 2024-02-10 DIAGNOSIS — Z9889 Other specified postprocedural states: Secondary | ICD-10-CM | POA: Diagnosis not present

## 2024-02-10 DIAGNOSIS — E1122 Type 2 diabetes mellitus with diabetic chronic kidney disease: Secondary | ICD-10-CM | POA: Diagnosis present

## 2024-02-10 DIAGNOSIS — Z681 Body mass index (BMI) 19 or less, adult: Secondary | ICD-10-CM | POA: Diagnosis not present

## 2024-02-10 DIAGNOSIS — Z8673 Personal history of transient ischemic attack (TIA), and cerebral infarction without residual deficits: Secondary | ICD-10-CM

## 2024-02-10 DIAGNOSIS — R739 Hyperglycemia, unspecified: Secondary | ICD-10-CM | POA: Diagnosis not present

## 2024-02-10 DIAGNOSIS — J9601 Acute respiratory failure with hypoxia: Secondary | ICD-10-CM | POA: Diagnosis present

## 2024-02-10 DIAGNOSIS — Z91048 Other nonmedicinal substance allergy status: Secondary | ICD-10-CM

## 2024-02-10 DIAGNOSIS — E43 Unspecified severe protein-calorie malnutrition: Secondary | ICD-10-CM | POA: Diagnosis present

## 2024-02-10 DIAGNOSIS — Z66 Do not resuscitate: Secondary | ICD-10-CM | POA: Diagnosis present

## 2024-02-10 DIAGNOSIS — Z8249 Family history of ischemic heart disease and other diseases of the circulatory system: Secondary | ICD-10-CM

## 2024-02-10 DIAGNOSIS — R7989 Other specified abnormal findings of blood chemistry: Secondary | ICD-10-CM | POA: Diagnosis present

## 2024-02-10 DIAGNOSIS — J918 Pleural effusion in other conditions classified elsewhere: Secondary | ICD-10-CM | POA: Diagnosis present

## 2024-02-10 DIAGNOSIS — J189 Pneumonia, unspecified organism: Secondary | ICD-10-CM | POA: Diagnosis not present

## 2024-02-10 DIAGNOSIS — Z515 Encounter for palliative care: Secondary | ICD-10-CM | POA: Diagnosis not present

## 2024-02-10 DIAGNOSIS — F419 Anxiety disorder, unspecified: Secondary | ICD-10-CM | POA: Diagnosis present

## 2024-02-10 DIAGNOSIS — I2510 Atherosclerotic heart disease of native coronary artery without angina pectoris: Secondary | ICD-10-CM | POA: Diagnosis present

## 2024-02-10 DIAGNOSIS — F17210 Nicotine dependence, cigarettes, uncomplicated: Secondary | ICD-10-CM | POA: Diagnosis present

## 2024-02-10 DIAGNOSIS — R54 Age-related physical debility: Secondary | ICD-10-CM | POA: Diagnosis present

## 2024-02-10 DIAGNOSIS — E1151 Type 2 diabetes mellitus with diabetic peripheral angiopathy without gangrene: Secondary | ICD-10-CM | POA: Diagnosis present

## 2024-02-10 DIAGNOSIS — Z794 Long term (current) use of insulin: Secondary | ICD-10-CM | POA: Diagnosis not present

## 2024-02-10 DIAGNOSIS — Z1152 Encounter for screening for COVID-19: Secondary | ICD-10-CM

## 2024-02-10 DIAGNOSIS — I5023 Acute on chronic systolic (congestive) heart failure: Secondary | ICD-10-CM | POA: Diagnosis present

## 2024-02-10 DIAGNOSIS — Z634 Disappearance and death of family member: Secondary | ICD-10-CM

## 2024-02-10 DIAGNOSIS — J90 Pleural effusion, not elsewhere classified: Secondary | ICD-10-CM

## 2024-02-10 DIAGNOSIS — I130 Hypertensive heart and chronic kidney disease with heart failure and stage 1 through stage 4 chronic kidney disease, or unspecified chronic kidney disease: Principal | ICD-10-CM | POA: Diagnosis present

## 2024-02-10 DIAGNOSIS — Z7982 Long term (current) use of aspirin: Secondary | ICD-10-CM

## 2024-02-10 DIAGNOSIS — N179 Acute kidney failure, unspecified: Secondary | ICD-10-CM | POA: Diagnosis present

## 2024-02-10 DIAGNOSIS — E1165 Type 2 diabetes mellitus with hyperglycemia: Secondary | ICD-10-CM | POA: Diagnosis present

## 2024-02-10 DIAGNOSIS — R0602 Shortness of breath: Secondary | ICD-10-CM

## 2024-02-10 DIAGNOSIS — E875 Hyperkalemia: Secondary | ICD-10-CM | POA: Diagnosis present

## 2024-02-10 DIAGNOSIS — I2489 Other forms of acute ischemic heart disease: Secondary | ICD-10-CM | POA: Diagnosis present

## 2024-02-10 DIAGNOSIS — I10 Essential (primary) hypertension: Secondary | ICD-10-CM | POA: Diagnosis not present

## 2024-02-10 DIAGNOSIS — Z955 Presence of coronary angioplasty implant and graft: Secondary | ICD-10-CM

## 2024-02-10 DIAGNOSIS — N1831 Chronic kidney disease, stage 3a: Secondary | ICD-10-CM | POA: Diagnosis present

## 2024-02-10 DIAGNOSIS — I739 Peripheral vascular disease, unspecified: Secondary | ICD-10-CM | POA: Diagnosis present

## 2024-02-10 DIAGNOSIS — E1129 Type 2 diabetes mellitus with other diabetic kidney complication: Secondary | ICD-10-CM | POA: Diagnosis present

## 2024-02-10 DIAGNOSIS — I509 Heart failure, unspecified: Secondary | ICD-10-CM

## 2024-02-10 DIAGNOSIS — Z951 Presence of aortocoronary bypass graft: Secondary | ICD-10-CM

## 2024-02-10 DIAGNOSIS — I639 Cerebral infarction, unspecified: Secondary | ICD-10-CM | POA: Diagnosis present

## 2024-02-10 DIAGNOSIS — Z7984 Long term (current) use of oral hypoglycemic drugs: Secondary | ICD-10-CM

## 2024-02-10 DIAGNOSIS — Z79899 Other long term (current) drug therapy: Secondary | ICD-10-CM

## 2024-02-10 LAB — COMPREHENSIVE METABOLIC PANEL
ALT: 65 U/L — ABNORMAL HIGH (ref 0–44)
AST: 84 U/L — ABNORMAL HIGH (ref 15–41)
Albumin: 3.7 g/dL (ref 3.5–5.0)
Alkaline Phosphatase: 89 U/L (ref 38–126)
Anion gap: 10 (ref 5–15)
BUN: 38 mg/dL — ABNORMAL HIGH (ref 8–23)
CO2: 27 mmol/L (ref 22–32)
Calcium: 8.8 mg/dL — ABNORMAL LOW (ref 8.9–10.3)
Chloride: 102 mmol/L (ref 98–111)
Creatinine, Ser: 1.5 mg/dL — ABNORMAL HIGH (ref 0.61–1.24)
GFR, Estimated: 43 mL/min — ABNORMAL LOW (ref >60)
Glucose, Bld: 345 mg/dL — ABNORMAL HIGH (ref 70–99)
Potassium: 5.1 mmol/L (ref 3.5–5.1)
Sodium: 139 mmol/L (ref 135–145)
Total Bilirubin: 0.8 mg/dL (ref 0.0–1.2)
Total Protein: 5.9 g/dL — ABNORMAL LOW (ref 6.5–8.1)

## 2024-02-10 LAB — CBC WITH DIFFERENTIAL/PLATELET
Abs Immature Granulocytes: 0.04 10*3/uL (ref 0.00–0.07)
Basophils Absolute: 0 10*3/uL (ref 0.0–0.1)
Basophils Relative: 1 %
Eosinophils Absolute: 0.1 10*3/uL (ref 0.0–0.5)
Eosinophils Relative: 1 %
HCT: 42.3 % (ref 39.0–52.0)
Hemoglobin: 13.5 g/dL (ref 13.0–17.0)
Immature Granulocytes: 1 %
Lymphocytes Relative: 13 %
Lymphs Abs: 0.8 10*3/uL (ref 0.7–4.0)
MCH: 33 pg (ref 26.0–34.0)
MCHC: 31.9 g/dL (ref 30.0–36.0)
MCV: 103.4 fL — ABNORMAL HIGH (ref 80.0–100.0)
Monocytes Absolute: 0.5 10*3/uL (ref 0.1–1.0)
Monocytes Relative: 8 %
Neutro Abs: 4.8 10*3/uL (ref 1.7–7.7)
Neutrophils Relative %: 76 %
Platelets: 184 10*3/uL (ref 150–400)
RBC: 4.09 MIL/uL — ABNORMAL LOW (ref 4.22–5.81)
RDW: 13 % (ref 11.5–15.5)
WBC: 6.2 10*3/uL (ref 4.0–10.5)
nRBC: 0 % (ref 0.0–0.2)

## 2024-02-10 LAB — RESP PANEL BY RT-PCR (RSV, FLU A&B, COVID)  RVPGX2
Influenza A by PCR: NEGATIVE
Influenza B by PCR: NEGATIVE
Resp Syncytial Virus by PCR: NEGATIVE
SARS Coronavirus 2 by RT PCR: NEGATIVE

## 2024-02-10 LAB — PROCALCITONIN: Procalcitonin: <0.1 ng/mL

## 2024-02-10 LAB — CBG MONITORING, ED
Glucose-Capillary: 302 mg/dL — ABNORMAL HIGH (ref 70–99)
Glucose-Capillary: 268 mg/dL — ABNORMAL HIGH (ref 70–99)
Glucose-Capillary: 209 mg/dL — ABNORMAL HIGH (ref 70–99)

## 2024-02-10 LAB — TROPONIN I (HIGH SENSITIVITY)
Troponin I (High Sensitivity): 193 ng/L (ref <18)
Troponin I (High Sensitivity): 202 ng/L (ref <18)

## 2024-02-10 LAB — BRAIN NATRIURETIC PEPTIDE: B Natriuretic Peptide: 1652.9 pg/mL — ABNORMAL HIGH (ref 0.0–100.0)

## 2024-02-10 MED ORDER — HYDRALAZINE HCL 20 MG/ML IJ SOLN: 5.0000 mg | INTRAMUSCULAR | Status: DC | PRN

## 2024-02-10 MED ORDER — ACETAMINOPHEN 325 MG PO TABS: 325.0000 mg | ORAL_TABLET | Freq: Four times a day (QID) | ORAL | Status: DC | PRN

## 2024-02-10 MED ORDER — INSULIN ASPART 100 UNIT/ML IJ SOLN
0.0000 [IU] | Freq: Every day | INTRAMUSCULAR | Status: DC
  Administered 2024-02-10: 3 [IU] via SUBCUTANEOUS
  Filled 2024-02-10: qty 1

## 2024-02-10 MED ORDER — DM-GUAIFENESIN ER 30-600 MG PO TB12: 1.0000 | ORAL_TABLET | Freq: Two times a day (BID) | ORAL | Status: DC | PRN

## 2024-02-10 MED ORDER — ASPIRIN 81 MG PO CHEW
324.0000 mg | CHEWABLE_TABLET | Freq: Once | ORAL | Status: AC
  Administered 2024-02-10: 324 mg via ORAL
  Filled 2024-02-10: qty 4

## 2024-02-10 MED ORDER — ALBUTEROL SULFATE (2.5 MG/3ML) 0.083% IN NEBU: 2.5000 mg | INHALATION_SOLUTION | RESPIRATORY_TRACT | Status: DC | PRN

## 2024-02-10 MED ORDER — INSULIN GLARGINE 100 UNIT/ML ~~LOC~~ SOLN
8.0000 [IU] | Freq: Every day | SUBCUTANEOUS | Status: DC
  Administered 2024-02-10: 8 [IU] via SUBCUTANEOUS
  Filled 2024-02-10 (×3): qty 0.08

## 2024-02-10 MED ORDER — FUROSEMIDE 10 MG/ML IJ SOLN
40.0000 mg | Freq: Two times a day (BID) | INTRAMUSCULAR | Status: DC
  Administered 2024-02-10 – 2024-02-11 (×2): 40 mg via INTRAVENOUS
  Filled 2024-02-10 (×2): qty 4

## 2024-02-10 MED ORDER — INSULIN ASPART 100 UNIT/ML IJ SOLN: 0.0000 [IU] | Freq: Three times a day (TID) | INTRAMUSCULAR | Status: DC

## 2024-02-10 MED ORDER — ENOXAPARIN SODIUM 30 MG/0.3ML IJ SOSY
30.0000 mg | PREFILLED_SYRINGE | INTRAMUSCULAR | Status: DC
  Administered 2024-02-10 – 2024-02-13 (×4): 30 mg via SUBCUTANEOUS
  Filled 2024-02-10 (×4): qty 0.3

## 2024-02-10 MED ORDER — ENSURE ENLIVE PO LIQD
237.0000 mL | Freq: Two times a day (BID) | ORAL | Status: DC
  Administered 2024-02-11 – 2024-02-17 (×9): 237 mL via ORAL

## 2024-02-10 MED ORDER — SODIUM CHLORIDE 0.9 % IV SOLN: 2.0000 g | Freq: Once | INTRAVENOUS | Status: DC

## 2024-02-10 MED ORDER — SODIUM CHLORIDE 0.9 % IV SOLN
100.0000 mg | Freq: Once | INTRAVENOUS | Status: DC
  Filled 2024-02-10: qty 100

## 2024-02-10 MED ORDER — ONDANSETRON HCL 4 MG/2ML IJ SOLN: 4.0000 mg | Freq: Three times a day (TID) | INTRAMUSCULAR | Status: DC | PRN

## 2024-02-10 MED ORDER — FUROSEMIDE 10 MG/ML IJ SOLN: 40.0000 mg | Freq: Once | INTRAMUSCULAR | Status: DC

## 2024-02-10 NOTE — ED Triage Notes (Signed)
 Family reports patient has had issues with elevated blood sugars for a few weeks; has tried to make an appointment with Endocrinologist but they "have to review his chart" before scheduling an appointment. Patient states "I want to stay here until ya'll figure out what's wrong."

## 2024-02-10 NOTE — Progress Notes (Signed)
 Anticoagulation monitoring(Lovenox):  89 yo  male ordered Lovenox 40 mg Q24h    There were no vitals filed for this visit. BMI 17.5   Lab Results  Component Value Date   CREATININE 1.50 (H) 02/10/2024   CREATININE 1.21 02/08/2024   CREATININE 1.36 (H) 02/03/2024   Estimated Creatinine Clearance: 22.7 mL/min (A) (by C-G formula based on SCr of 1.5 mg/dL (H)). Hemoglobin & Hematocrit     Component Value Date/Time   HGB 13.5 02/10/2024 1854   HGB 11.5 (L) 03/23/2015 0508   HCT 42.3 02/10/2024 1854   HCT 34.3 (L) 03/23/2015 0630     Per Protocol for Patient with estCrcl < 30 ml/min and BMI < 30, will transition to Lovenox 30 mg Q24h.

## 2024-02-10 NOTE — ED Notes (Signed)
CBG 209. 

## 2024-02-10 NOTE — ED Notes (Signed)
 Patient placed on 6L [place] and taken to room 4

## 2024-02-10 NOTE — ED Provider Notes (Signed)
 [facility] Provider Note    Event Date/Time   First MD Initiated Contact with Patient 02/10/24 1844     (approximate)   History   Hyperglycemia   HPI  Peter Becker is a 89 y.o. male who comes to the emergency room with hyperglycemia.  On review of records patient was seen on 3/9 with sugars in the 200s and therefore patient was discharged.  Patient was noted to be hypoxic.  There was some concern the patient is been more short of breath for the past few days increasing today.  Patient is on some Lasix at home.  Physical Exam   Triage Vital Signs: ED Triage Vitals [02/10/24 1837]  Encounter Vitals Group     BP 102/60     Systolic BP Percentile      Diastolic BP Percentile      Pulse Rate 85     Resp 20     Temp      Temp src      SpO2 (!) 54 %     Weight      Height      Head Circumference      Peak Flow      Pain Score 0     Pain Loc      Pain Education      Exclude from Growth Chart     Most recent vital signs: Vitals:   02/10/24 1908 02/10/24 1918  BP: 116/70 125/72  Pulse: 78 77  Resp: (!) 29 (!) 33  Temp: 97.8 F (36.6 C)   SpO2: 97% 97%     General: Awake, no distress.  CV:  Good peripheral perfusion.  Resp:  Normal effort.  Clear lungs no wheezing Abd:  No distention.  Other:  No calf tenderness   ED Results / Procedures / Treatments   Labs (all labs ordered are listed, but only abnormal results are displayed) Labs Reviewed  CBC WITH DIFFERENTIAL/PLATELET - Abnormal; Notable for the following components:      Result Value   RBC 4.09 (*)    MCV 103.4 (*)    All other components within normal limits  COMPREHENSIVE METABOLIC PANEL - Abnormal; Notable for the following components:   Glucose, Bld 345 (*)    BUN 38 (*)    Creatinine, Ser 1.50 (*)    Calcium 8.8 (*)    Total Protein 5.9 (*)    AST 84 (*)    ALT 65 (*)    GFR, Estimated 43 (*)    All other components within normal limits  BRAIN NATRIURETIC  PEPTIDE - Abnormal; Notable for the following components:   B Natriuretic Peptide 1,652.9 (*)    All other components within normal limits  CBG MONITORING, ED - Abnormal; Notable for the following components:   Glucose-Capillary 302 (*)    All other components within normal limits  TROPONIN I (HIGH SENSITIVITY) - Abnormal; Notable for the following components:   Troponin I (High Sensitivity) 193 (*)    All other components within normal limits  RESP PANEL BY RT-PCR (RSV, FLU A&B, COVID)  RVPGX2  PROCALCITONIN  TROPONIN I (HIGH SENSITIVITY)     EKG  My interpretation of EKG:  Sinus rate of 78 without any ST elevation or T wave inversions except for V2 with right bundle branch block  RADIOLOGY I have reviewed the xray personally and interpreted positive edema   PROCEDURES:  Critical Care performed: No  .1-3 Lead EKG Interpretation  Performed by: Concha Se, MD Authorized by: Concha Se, MD     Interpretation: normal     ECG rate:  70   ECG rate assessment: normal     Rhythm: sinus rhythm     Ectopy: none     Conduction: normal      MEDICATIONS ORDERED IN ED: Medications  furosemide (LASIX) injection 40 mg (has no administration in time range)  cefTRIAXone (ROCEPHIN) 2 g in sodium chloride 0.9 % 100 mL IVPB (has no administration in time range)  doxycycline (VIBRAMYCIN) 100 mg in sodium chloride 0.9 % 250 mL IVPB (has no administration in time range)  aspirin chewable tablet 324 mg (has no administration in time range)     IMPRESSION / MDM / ASSESSMENT AND PLAN / ED COURSE  I reviewed the triage vital signs and the nursing notes.   Patient's presentation is most consistent with acute presentation with potential threat to life or bodily function.   Differential includes CHF, PE, pneumonia, DKA.  Will get chest x-ray, COVID, flu and if workup is otherwise unrevealing will consider CT PE patient comes in hypoxic and placed on nasal cannula.  Patient is  COVID, flu are negative.  Troponins are elevated but similar to priors.  Will hold off on heparin at this time.  Glucose is elevated but no evidence of DKA.  BNP is elevated procalcitonin is negative.  CT imaging concerning for worsening pleural effusions with possible pneumonia I added on antibiotics and I will discuss with the hospitalist for admission.  Given a dose of Lasix.  The patient is on the cardiac monitor to evaluate for evidence of arrhythmia and/or significant heart rate changes.      FINAL CLINICAL IMPRESSION(S) / ED DIAGNOSES   Final diagnoses:  Hyperglycemia  Acute on chronic congestive heart failure, unspecified heart failure type (HCC)  Pneumonia due to infectious organism, unspecified laterality, unspecified part of lung     Rx / DC Orders   ED Discharge Orders     None        Note:  This document was prepared using Dragon voice recognition software and may include unintentional dictation errors.   Concha Se, MD 02/10/24 2118

## 2024-02-10 NOTE — H&P (Signed)
 History and Physical    Peter Becker ZOX:[id number] DOB: 17-Nov-1929 DOA: 02/10/2024  Referring MD/NP/PA:   PCP: Danella Penton, MD   Patient coming from:  The patient is coming from home.     Chief Complaint: SOB and   HPI: Peter Becker is a 89 y.o. male with medical history significant of sCHF with EF 35-40%, HTN, HLD, DM, CAD, CABG, PVD, stroke, anxiety, CKD-3a, who presents with SOB.  Pt states that he has shortness of breath in the past several days, which has been progressively worsening.  Patient has mild dry cough, no chest pain, fever or chills.  Patient was found to have oxygen desaturation to 54% on room air, with acute respiratory distress, difficulty speaking in full sentence. Initially started on high flow nasal cannula oxygen, and then titrated down to 5 L oxygen with 94% of saturation.  Patient does not have nausea, vomiting, diarrhea or abdominal pain.  No symptoms of UTI.  No fever or chills.  Patient states that his blood sugar has been elevated in the past several weeks. He has tried to make an appointment with Endocrinologist but they "have to review his chart" before scheduling an appointment   Data reviewed independently and ED Course: pt was found to have BNP 1652.9, troponin 193, negative PCR for COVID, flu and RSV, procalcitonin <0.10, slightly worsening renal function, abnormal liver function (ALP 89, AST 84, ALT 65, total bilirubin 0.8).  Temperature normal, blood pressure 102/71, heart rate 85, RR 33 --> 24.  Chest x-ray showed pulmonary edema and pleural effusion bilaterally (left side is worse than the right).  Patient is admitted to PCU as inpatient.  Chest x-ray: 1. Pulmonary edema with likely superimposed infection of the left mid lung zone. 2. Interval increase in size of a small to moderate left pleural effusion. 3. Grossly stable at least small right pleural effusion.     EKG: I have personally reviewed.  Sinus rhythm, QTc 483, bifascicular block,  PAC.   Review of Systems:   General: no fevers, chills, no body weight gain, has fatigue HEENT: no blurry vision, hearing changes or sore throat Respiratory: has dyspnea, coughing, no wheezing CV: no chest pain, no palpitations GI: no nausea, vomiting, abdominal pain, diarrhea, constipation GU: no dysuria, burning on urination, increased urinary frequency, hematuria  Ext: has trace leg edema Neuro: no unilateral weakness, numbness, or tingling, no vision change or hearing loss Skin: no rash, no skin tear. MSK: No muscle spasm, no deformity, no limitation of range of movement in spin Heme: No easy bruising.  Travel history: No recent long distant travel.   Allergy:  Allergies  Allergen Reactions   Tape Other (See Comments)    SKIN IS VERY THIN AND TEARS AND BRUISES EASILY; Please use an alternative!!    Past Medical History:  Diagnosis Date   CHF (congestive heart failure) (HCC)    Chronic kidney disease    Coronary artery disease    Diabetes mellitus without complication (HCC)    Hx of CABG    Hypertension    Myocardial infarct (HCC)    hx 3 MI. stents placed in 1995   PVD (peripheral vascular disease) (HCC)    Stroke (HCC) 2018   had R deficits after stroke, was on hospice, then deficits resolved    Past Surgical History:  Procedure Laterality Date   CARPAL TUNNEL RELEASE Left    CHOLECYSTECTOMY     EYE SURGERY     heart stent  triple bypass   HEMORRHOID SURGERY     TONSILLECTOMY      Social History:  reports that he has quit smoking. He has never used smokeless tobacco. He reports that he does not drink alcohol and does not use drugs.  Family History:  Family History  Problem Relation Age of Onset   Heart attack Father      Prior to Admission medications   Medication Sig Start Date End Date Taking? Authorizing Provider  acetaminophen (TYLENOL) 325 MG tablet Take 2 tablets (650 mg total) by mouth every 6 (six) hours as needed for mild pain or fever.  02/13/23   Loyce Dys, MD  aspirin EC 81 MG tablet Take 1 tablet (81 mg total) by mouth daily. Swallow whole. 02/14/23   Loyce Dys, MD  atorvastatin (LIPITOR) 40 MG tablet Take 1 tablet (40 mg total) by mouth daily. 02/14/23   Loyce Dys, MD  carvedilol (COREG) 3.125 MG tablet Take 1 tablet (3.125 mg total) by mouth 2 (two) times daily with a meal. 02/13/23   Djan, Scarlette Calico, MD  cyanocobalamin (,VITAMIN B-12,) 1000 MCG/ML injection INJ 1 ML IM Q 14 DAYS 12/29/17   [provider]  dapagliflozin propanediol (FARXIGA) 10 MG TABS tablet Take 1 tablet (10 mg total) by mouth daily before breakfast. Please call [phone] to schedule an appointment prior to next refill request. Thank you. 12/15/23   Furth, Cadence H, PA-C  furosemide (LASIX) 20 MG tablet Take 1 tablet (20 mg total) by mouth daily. Please call [phone] to schedule an appointment prior to next refill request. Thank you. 12/15/23   Furth, Cadence H, PA-C  glimepiride (AMARYL) 4 MG tablet Take 4 mg by mouth daily with breakfast.    [provider]  losartan (COZAAR) 25 MG tablet Take 0.5 tablets (12.5 mg total) by mouth daily. 02/13/23   Loyce Dys, MD  pioglitazone (ACTOS) 15 MG tablet Take 15 mg by mouth daily.    [provider]  tamsulosin (FLOMAX) 0.4 MG CAPS capsule Take 0.4 mg by mouth daily.    [provider]    Physical Exam: Vitals:   02/10/24 2000 02/10/24 2030 02/10/24 2100 02/10/24 2130  BP: 131/76 122/74 122/71 129/74  Pulse: 72 78 76 85  Resp: (!) 25 (!) 24 (!) 24 16  Temp:      TempSrc:      SpO2: 97% 94% 95% 98%   General: Not in acute distress HEENT:       Eyes: PERRL, EOMI, no jaundice       ENT: No discharge from the ears and nose, no pharynx injection, no tonsillar enlargement.        Neck: positive JVD, no bruit, no mass felt. Heme: No neck lymph node enlargement. Cardiac: S1/S2, RRR with premature beats, No murmurs, No gallops or rubs. Respiratory: has  fine crackles bilaterally GI: Soft, nondistended, nontender, no rebound pain, no organomegaly, BS present. GU: No hematuria Ext: Has trace leg edema bilaterally. 1+DP/PT pulse bilaterally. Musculoskeletal: No joint deformities, No joint redness or warmth, no limitation of ROM in spin. Skin: No rashes.  Neuro: Alert, oriented X3, cranial nerves II-XII grossly intact, moves all extremities normally.  Psych: Patient is not psychotic, no suicidal or hemocidal ideation.  Labs on Admission: I have personally reviewed following labs and imaging studies  CBC: Recent Labs  Lab 02/08/24 1923 02/10/24 1854  WBC 6.2 6.2  NEUTROABS  --  4.8  HGB 13.0 13.5  HCT 41.1 42.3  MCV 105.4* 103.4*  PLT 180 184   Basic Metabolic Panel: Recent Labs  Lab 02/08/24 1923 02/10/24 1854  NA 142 139  K 4.7 5.1  CL 104 102  CO2 30 27  GLUCOSE 276* 345*  BUN 30* 38*  CREATININE 1.21 1.50*  CALCIUM 8.9 8.8*   GFR: Estimated Creatinine Clearance: 22.7 mL/min (A) (by C-G formula based on SCr of 1.5 mg/dL (H)). Liver Function Tests: Recent Labs  Lab 02/10/24 1854  AST 84*  ALT 65*  ALKPHOS 89  BILITOT 0.8  PROT 5.9*  ALBUMIN 3.7   No results for input(s): "LIPASE", "AMYLASE" in the last 168 hours. No results for input(s): "AMMONIA" in the last 168 hours. Coagulation Profile: No results for input(s): "INR", "PROTIME" in the last 168 hours. Cardiac Enzymes: No results for input(s): "CKTOTAL", "CKMB", "CKMBINDEX", "TROPONINI" in the last 168 hours. BNP (last 3 results) No results for input(s): "PROBNP" in the last 8760 hours. HbA1C: No results for input(s): "HGBA1C" in the last 72 hours. CBG: Recent Labs  Lab 02/08/24 1920 02/10/24 1854 02/10/24 2155  GLUCAP 235* 302* 268*   Lipid Profile: No results for input(s): "CHOL", "HDL", "LDLCALC", "TRIG", "CHOLHDL", "LDLDIRECT" in the last 72 hours. Thyroid Function Tests: No results for input(s): "TSH", "T4TOTAL", "FREET4", "T3FREE",  "THYROIDAB" in the last 72 hours. Anemia Panel: No results for input(s): "VITAMINB12", "FOLATE", "FERRITIN", "TIBC", "IRON", "RETICCTPCT" in the last 72 hours. Urine analysis:    Component Value Date/Time   COLORURINE YELLOW (A) 02/08/2024 2025   APPEARANCEUR CLEAR (A) 02/08/2024 2025   LABSPEC 1.026 02/08/2024 2025   PHURINE 5.0 02/08/2024 2025   GLUCOSEU >=500 (A) 02/08/2024 2025   HGBUR NEGATIVE 02/08/2024 2025   BILIRUBINUR NEGATIVE 02/08/2024 2025   KETONESUR NEGATIVE 02/08/2024 2025   PROTEINUR 100 (A) 02/08/2024 2025   NITRITE NEGATIVE 02/08/2024 2025   LEUKOCYTESUR NEGATIVE 02/08/2024 2025   Sepsis Labs: @LABRCNTIP (procalcitonin:4,lacticidven:4) ) Recent Results (from the past 240 hours)  Resp panel by RT-PCR (RSV, Flu A&B, Covid) Anterior Nasal Swab     Status: None   Collection Time: 02/10/24  6:54 PM   Specimen: Anterior Nasal Swab  Result Value Ref Range Status   SARS Coronavirus 2 by RT PCR NEGATIVE NEGATIVE Final    Comment: (NOTE) SARS-CoV-2 target nucleic acids are NOT DETECTED.  The SARS-CoV-2 RNA is generally detectable in upper respiratory specimens during the acute phase of infection. The lowest concentration of SARS-CoV-2 viral copies this assay can detect is 138 copies/mL. A negative result does not preclude SARS-Cov-2 infection and should not be used as the sole basis for treatment or other patient management decisions. A negative result may occur with  improper specimen collection/handling, submission of specimen other than nasopharyngeal swab, presence of viral mutation(s) within the areas targeted by this assay, and inadequate number of viral copies(<138 copies/mL). A negative result must be combined with clinical observations, patient history, and epidemiological information. The expected result is Negative.  Fact Sheet for Patients:  BloggerCourse.com  Fact Sheet for Healthcare Providers:   SeriousBroker.it  This test is no t yet approved or cleared by the [place] FDA and  has been authorized for detection and/or diagnosis of SARS-CoV-2 by FDA under an Emergency Use Authorization (EUA). This EUA will remain  in effect (meaning this test can be used) for the duration of the COVID-19 declaration under Section 564(b)(1) of the Act, 21 U.S.C.section 360bbb-3(b)(1), unless the authorization is terminated  or revoked sooner.  Influenza A by PCR NEGATIVE NEGATIVE Final   Influenza B by PCR NEGATIVE NEGATIVE Final    Comment: (NOTE) The Xpert Xpress SARS-CoV-2/FLU/RSV plus assay is intended as an aid in the diagnosis of influenza from Nasopharyngeal swab specimens and should not be used as a sole basis for treatment. Nasal washings and aspirates are unacceptable for Xpert Xpress SARS-CoV-2/FLU/RSV testing.  Fact Sheet for Patients: BloggerCourse.com  Fact Sheet for Healthcare Providers: SeriousBroker.it  This test is not yet approved or cleared by the [place] FDA and has been authorized for detection and/or diagnosis of SARS-CoV-2 by FDA under an Emergency Use Authorization (EUA). This EUA will remain in effect (meaning this test can be used) for the duration of the COVID-19 declaration under Section 564(b)(1) of the Act, 21 U.S.C. section 360bbb-3(b)(1), unless the authorization is terminated or revoked.     Resp Syncytial Virus by PCR NEGATIVE NEGATIVE Final    Comment: (NOTE) Fact Sheet for Patients: BloggerCourse.com  Fact Sheet for Healthcare Providers: SeriousBroker.it  This test is not yet approved or cleared by the [place] FDA and has been authorized for detection and/or diagnosis of SARS-CoV-2 by FDA under an Emergency Use Authorization (EUA). This EUA will remain in effect (meaning this test can be used) for  the duration of the COVID-19 declaration under Section 564(b)(1) of the Act, 21 U.S.C. section 360bbb-3(b)(1), unless the authorization is terminated or revoked.  Performed at [facility], [address] [place], [place] [address]      Radiological Exams on Admission:   Assessment/Plan Principal Problem:   Acute on chronic systolic CHF (congestive heart failure) (HCC) Active Problems:   Acute respiratory failure with hypoxia (HCC)   CAD (coronary artery disease)   Myocardial injury   Essential hypertension   Hyperlipidemia   Type II diabetes mellitus with renal manifestations (HCC)   Chronic kidney disease, stage 3a (HCC)   BPH (benign prostatic hyperplasia)   Abnormal LFTs   Protein-calorie malnutrition, severe (HCC)   Assessment and Plan:   Acute respiratory failure with hypoxia due to acute on chronic systolic CHF (congestive heart failure) (HCC): Patient has only has trace leg edema, but has SOB, significantly elevated BNP 1652, positive JVD, crackles on auscultation, pulm edema on chest x-ray, clinically consistent with CHF exacerbation.  2D echo on 02/09/2023 showed EF of 35-40%.  Patient does not have fever or leukocytosis.  Procalcitonin<0.10, clinically does not have pneumonia.  Will discontinue Rocephin and doxycycline which were ordered by ED physician (not given yet).  -Will admit to PCU as inpatient -Lasix 40 mg bid by IV -2d echo -Daily weights -strict I/O's -Low salt diet -Fluid restriction -As needed bronchodilators for shortness of breath  CAD (coronary artery disease) and myocardial injury: s/p of CABG. Trop  193, no CP.  Likely due to demand ischemia. -ASA and lipitor - trend trop -check A1c and FLP -f/u 2d echo   Essential hypertension -IV hydralazine as needed -Cozaar, coreg   Hyperlipidemia -Lipitor   Ischemic stroke (HCC) -Aspirin and Lipitor   Type II diabetes mellitus with renal manifestations [facility]): Recent A1c 11.3 poorly  controlled.  Blood sugar 321.  Patient is taking Actos, Amaryl and Farxiga -SSI -Start glargine insulin 8 units daily   Chronic kidney disease, stage 3a (HCC): Slightly worsening than baseline.  Recent baseline creatinine 1.2-1.5.  His creatinine is 1.50, BUN 38, GFR 43. -Follow-up by BMP closely  BPH (benign prostatic hyperplasia) -Flomax  Abnormal LFTs: Possibly due to liver congestion secondary  to CHF. -Follow-up hepatitis panel -Judiciously use low-dose prn Tylenol (patient is not a good candidate for using NSAIDs due to CKD-3a)  Protein-calorie malnutrition, severe (HCC): Body weight 52.2 kg, BMI 17.49 -Ensure -Nutrition consult        DVT ppx: SQ Lovenox  Code Status: DNR (I discussed with patient, and explained the meaning of CODE STATUS. Patient wants to be DNR)  Family Communication:     not done, no family member is at bed side.       Disposition Plan:  Anticipate discharge back to previous environment  Consults called:  none  Admission status and Level of care: Progressive:  as inpt        Dispo: The patient is from: Home              Anticipated d/c is to: Home              Anticipated d/c date is: 2 days              Patient currently is not medically stable to d/c.    Severity of Illness:  The appropriate patient status for this patient is INPATIENT. Inpatient status is judged to be reasonable and necessary in order to provide the required intensity of service to ensure the patient's safety. The patient's presenting symptoms, physical exam findings, and initial radiographic and laboratory data in the context of their chronic comorbidities is felt to place them at high risk for further clinical deterioration. Furthermore, it is not anticipated that the patient will be medically stable for discharge from the hospital within 2 midnights of admission.   * I certify that at the point of admission it is my clinical judgment that the patient will require  inpatient hospital care spanning beyond 2 midnights from the point of admission due to high intensity of service, high risk for further deterioration and high frequency of surveillance required.*       Date of Service 02/10/2024    Lorretta Harp [facility]   If 7PM-7AM, please contact night-coverage [email or web address] 02/10/2024, 10:03 PM

## 2024-02-10 NOTE — ED Notes (Signed)
 This RN received report from Garnet Sierras and performed bedside care handoff. This RN introduced self to pt. Call light in reach, bed wheels locked, side rail raised, pt updated on plan of care. Rounding completed.

## 2024-02-11 ENCOUNTER — Inpatient Hospital Stay

## 2024-02-11 ENCOUNTER — Inpatient Hospital Stay (HOSPITAL_COMMUNITY)
Admit: 2024-02-11 | Discharge: 2024-02-11 | Disposition: A | Discharge status: Home / Self Care | Attending: Internal Medicine | Admitting: Internal Medicine

## 2024-02-11 DIAGNOSIS — I5023 Acute on chronic systolic (congestive) heart failure: Secondary | ICD-10-CM | POA: Diagnosis not present

## 2024-02-11 LAB — PROTEIN, PLEURAL OR PERITONEAL FLUID: Total protein, fluid: <3 g/dL

## 2024-02-11 LAB — MAGNESIUM: Magnesium: 2.3 mg/dL (ref 1.7–2.4)

## 2024-02-11 LAB — CBC
HCT: 40.5 % (ref 39.0–52.0)
Hemoglobin: 13 g/dL (ref 13.0–17.0)
MCH: 33.5 pg (ref 26.0–34.0)
MCHC: 32.1 g/dL (ref 30.0–36.0)
MCV: 104.4 fL — ABNORMAL HIGH (ref 80.0–100.0)
Platelets: 188 10*3/uL (ref 150–400)
RBC: 3.88 MIL/uL — ABNORMAL LOW (ref 4.22–5.81)
RDW: 13 % (ref 11.5–15.5)
WBC: 7.5 10*3/uL (ref 4.0–10.5)
nRBC: 0.3 % — ABNORMAL HIGH (ref 0.0–0.2)

## 2024-02-11 LAB — ECHOCARDIOGRAM COMPLETE
AR max vel: 2.23 cm2
AV Area VTI: 2.12 cm2
AV Area mean vel: 1.83 cm2
AV Mean grad: 4 mmHg
AV Peak grad: 6.3 mmHg
Ao pk vel: 1.25 m/s
Area-P 1/2: 4.8 cm2
Calc EF: 33.5 %
MV VTI: 2.19 cm2
S' Lateral: 3.6 cm
Single Plane A2C EF: 36.1 %
Single Plane A4C EF: 26.8 %

## 2024-02-11 LAB — LIPID PANEL
Cholesterol: 99 mg/dL (ref 0–200)
HDL: 56 mg/dL (ref >40)
LDL Cholesterol: 37 mg/dL (ref 0–99)
Total CHOL/HDL Ratio: 1.8 ratio
Triglycerides: 28 mg/dL (ref <150)
VLDL: 6 mg/dL (ref 0–40)

## 2024-02-11 LAB — D-DIMER, QUANTITATIVE: D-Dimer, Quant: 2.48 ug{FEU}/mL — ABNORMAL HIGH (ref 0.00–0.50)

## 2024-02-11 LAB — BODY FLUID CELL COUNT WITH DIFFERENTIAL
Eos, Fluid: 0 %
Lymphs, Fluid: 79 %
Monocyte-Macrophage-Serous Fluid: 10 % — ABNORMAL LOW (ref 50–90)
Neutrophil Count, Fluid: 11 % (ref 0–25)
Total Nucleated Cell Count, Fluid: 491 uL (ref 0–1000)

## 2024-02-11 LAB — CBG MONITORING, ED
Glucose-Capillary: 117 mg/dL — ABNORMAL HIGH (ref 70–99)
Glucose-Capillary: 26 mg/dL — CL (ref 70–99)
Glucose-Capillary: 114 mg/dL — ABNORMAL HIGH (ref 70–99)
Glucose-Capillary: 32 mg/dL — CL (ref 70–99)
Glucose-Capillary: 40 mg/dL — CL (ref 70–99)
Glucose-Capillary: 176 mg/dL — ABNORMAL HIGH (ref 70–99)
Glucose-Capillary: 137 mg/dL — ABNORMAL HIGH (ref 70–99)

## 2024-02-11 LAB — BASIC METABOLIC PANEL
Anion gap: 7 (ref 5–15)
BUN: 40 mg/dL — ABNORMAL HIGH (ref 8–23)
CO2: 27 mmol/L (ref 22–32)
Calcium: 8.3 mg/dL — ABNORMAL LOW (ref 8.9–10.3)
Chloride: 108 mmol/L (ref 98–111)
Creatinine, Ser: 1.52 mg/dL — ABNORMAL HIGH (ref 0.61–1.24)
GFR, Estimated: 42 mL/min — ABNORMAL LOW (ref >60)
Glucose, Bld: 71 mg/dL (ref 70–99)
Potassium: 5.6 mmol/L — ABNORMAL HIGH (ref 3.5–5.1)
Sodium: 142 mmol/L (ref 135–145)

## 2024-02-11 LAB — HEMOGLOBIN A1C
Hgb A1c MFr Bld: 10.1 % — ABNORMAL HIGH (ref 4.8–5.6)
Mean Plasma Glucose: 243.17 mg/dL

## 2024-02-11 LAB — BLOOD GAS, ARTERIAL
Acid-Base Excess: 1.2 mmol/L (ref 0.0–2.0)
Bicarbonate: 28.7 mmol/L — ABNORMAL HIGH (ref 20.0–28.0)
Expiratory PAP: 8 cmH2O
FIO2: 70 %
Inspiratory PAP: 15 cmH2O
O2 Saturation: 86.1 %
Patient temperature: 37
pCO2 arterial: 57 mmHg — ABNORMAL HIGH (ref 32–48)
pH, Arterial: 7.31 — ABNORMAL LOW (ref 7.35–7.45)
pO2, Arterial: 56 mmHg — ABNORMAL LOW (ref 83–108)

## 2024-02-11 LAB — LACTATE DEHYDROGENASE: LDH: 225 U/L — ABNORMAL HIGH (ref 98–192)

## 2024-02-11 LAB — TROPONIN I (HIGH SENSITIVITY)
Troponin I (High Sensitivity): 217 ng/L (ref <18)
Troponin I (High Sensitivity): 234 ng/L (ref <18)
Troponin I (High Sensitivity): 215 ng/L (ref <18)

## 2024-02-11 LAB — POTASSIUM: Potassium: 4.3 mmol/L (ref 3.5–5.1)

## 2024-02-11 LAB — LACTATE DEHYDROGENASE, PLEURAL OR PERITONEAL FLUID: LD, Fluid: 64 U/L — ABNORMAL HIGH (ref 3–23)

## 2024-02-11 LAB — GLUCOSE, PLEURAL OR PERITONEAL FLUID: Glucose, Fluid: 145 mg/dL

## 2024-02-11 LAB — HEPATITIS PANEL, ACUTE
HCV Ab: NONREACTIVE
Hep A IgM: NONREACTIVE
Hep B C IgM: NONREACTIVE
Hepatitis B Surface Ag: NONREACTIVE

## 2024-02-11 MED ORDER — CARVEDILOL 6.25 MG PO TABS
3.1250 mg | ORAL_TABLET | Freq: Two times a day (BID) | ORAL | Status: DC
  Administered 2024-02-11 – 2024-02-12 (×2): 3.125 mg via ORAL
  Filled 2024-02-11 (×3): qty 1

## 2024-02-11 MED ORDER — LIDOCAINE HCL (PF) 1 % IJ SOLN
10.0000 mL | Freq: Once | INTRAMUSCULAR | Status: AC
  Administered 2024-02-11: 10 mL via INTRADERMAL

## 2024-02-11 MED ORDER — ATORVASTATIN CALCIUM 20 MG PO TABS
40.0000 mg | ORAL_TABLET | Freq: Every day | ORAL | Status: DC
  Administered 2024-02-12 – 2024-02-14 (×3): 40 mg via ORAL
  Filled 2024-02-11 (×3): qty 2

## 2024-02-11 MED ORDER — INSULIN ASPART 100 UNIT/ML IJ SOLN
0.0000 [IU] | Freq: Three times a day (TID) | INTRAMUSCULAR | Status: DC
  Administered 2024-02-11: 0 [IU] via SUBCUTANEOUS

## 2024-02-11 MED ORDER — DEXTROSE 50 % IV SOLN
25.0000 g | INTRAVENOUS | Status: AC
  Administered 2024-02-11: 25 g via INTRAVENOUS

## 2024-02-11 MED ORDER — ASPIRIN 81 MG PO TBEC
81.0000 mg | DELAYED_RELEASE_TABLET | Freq: Every day | ORAL | Status: DC
  Administered 2024-02-12 – 2024-02-14 (×3): 81 mg via ORAL
  Filled 2024-02-11 (×3): qty 1

## 2024-02-11 MED ORDER — INSULIN ASPART 100 UNIT/ML IJ SOLN: 0.0000 [IU] | Freq: Every day | INTRAMUSCULAR | Status: DC

## 2024-02-11 MED ORDER — FUROSEMIDE 10 MG/ML IJ SOLN: 60.0000 mg | Freq: Two times a day (BID) | INTRAMUSCULAR | Status: DC

## 2024-02-11 MED ORDER — LOSARTAN POTASSIUM 25 MG PO TABS
12.5000 mg | ORAL_TABLET | Freq: Every day | ORAL | Status: DC
  Filled 2024-02-11: qty 0.5

## 2024-02-11 MED ORDER — FUROSEMIDE 10 MG/ML IJ SOLN
60.0000 mg | Freq: Two times a day (BID) | INTRAMUSCULAR | Status: AC
  Administered 2024-02-11: 60 mg via INTRAVENOUS
  Filled 2024-02-11: qty 8

## 2024-02-11 MED ORDER — TAMSULOSIN HCL 0.4 MG PO CAPS
0.4000 mg | ORAL_CAPSULE | Freq: Every day | ORAL | Status: DC
  Administered 2024-02-12 – 2024-02-14 (×3): 0.4 mg via ORAL
  Filled 2024-02-11 (×3): qty 1

## 2024-02-11 MED ORDER — DEXTROSE 50 % IV SOLN
INTRAVENOUS | Status: AC
  Administered 2024-02-11: 50 mL via INTRAVENOUS
  Filled 2024-02-11: qty 50

## 2024-02-11 NOTE — ED Notes (Signed)
Lab called to collect labs  

## 2024-02-11 NOTE — ED Notes (Signed)
 RT called to come and assess pt's heated high flow settings.

## 2024-02-11 NOTE — ED Notes (Signed)
 PA to bedside to perform procedure.

## 2024-02-11 NOTE — ED Notes (Signed)
 Pt A&Ox4 at this time. Vital signs WDL at this time.

## 2024-02-11 NOTE — ED Notes (Signed)
 Pt requested to take of BiPap. Attempting trial to downgrade O2 assistance. O2@ 6L O2 sats dropped between 86-92%. Placed on Hi Flo, sats 89 - 95%. Informed Respiratory of change. Patient is sleep with no s/s of distress

## 2024-02-11 NOTE — ED Notes (Signed)
 This RN gave report to [employer] and performed bedside care handoff.

## 2024-02-11 NOTE — Procedures (Signed)
 PROCEDURE SUMMARY:  Successful US guided right thoracentesis. Yielded 900 mL of clear, dark yellow fluid. Patient tolerated procedure well. No immediate complications. EBL = trace  Specimen was sent for labs.  Post procedure chest X-ray reveals no pneumothorax  Loman Brooklyn PA-C 02/11/2024 11:20 AM

## 2024-02-11 NOTE — Progress Notes (Signed)
*  PRELIMINARY RESULTS* Echocardiogram 2D Echocardiogram has been performed.  Carolyne Fiscal 02/11/2024, 2:22 PM

## 2024-02-11 NOTE — ED Notes (Signed)
 Pt placed on bipap at this time. MD made aware.

## 2024-02-11 NOTE — Progress Notes (Addendum)
 PROGRESS NOTE    Peter Becker  WGN:[id number] DOB: Apr 21, 1929 DOA: 02/10/2024 PCP: Danella Penton, MD  Outpatient Specialists: cardiology    Brief Narrative:   From admission h and p  Peter Becker is a 89 y.o. male with medical history significant of sCHF with EF 35-40%, HTN, HLD, DM, CAD, CABG, PVD, stroke, anxiety, CKD-3a, who presents with SOB.   Pt states that he has shortness of breath in the past several days, which has been progressively worsening.  Patient has mild dry cough, no chest pain, fever or chills.  Patient was found to have oxygen desaturation to 54% on room air, with acute respiratory distress, difficulty speaking in full sentence. Initially started on high flow nasal cannula oxygen, and then titrated down to 5 L oxygen with 94% of saturation.  Patient does not have nausea, vomiting, diarrhea or abdominal pain.  No symptoms of UTI.  No fever or chills.  Patient states that his blood sugar has been elevated in the past several weeks. He has tried to make an appointment with Endocrinologist but they "have to review his chart" before scheduling an appointment  Assessment & Plan:   Principal Problem:   Acute on chronic systolic CHF (congestive heart failure) (HCC) Active Problems:   Acute respiratory failure with hypoxia (HCC)   CAD (coronary artery disease)   Myocardial injury   Essential hypertension   Hyperlipidemia   Ischemic stroke (HCC)   Type II diabetes mellitus with renal manifestations (HCC)   Chronic kidney disease, stage 3a (HCC)   BPH (benign prostatic hyperplasia)   Abnormal LFTs   Protein-calorie malnutrition, severe (HCC)   PAD (peripheral artery disease) (HCC)   # Hyperglycemia # T2DM Despite what h and p says, patient reports (and initial ED provider report) says patient presented with concern for hyperglycemia. A1c is in the 10s. Given insulin and profoundly hypoglycemic this mornig, resolved with food - SSI sensitive for today - holding  home glipizide, pioglitazone  # Hypoxia Relatively asymptomatic but requiring high flow to maintain his sats. No dyspnea, low procal. Trops are int he 200s but no chest pain, no overt ischemic changes on EKG. Does have pleural effusion as well - HF [place] for now - f/u dimer - abg - consider chest imaging - chf as below  # HFrEF, decompensated Bnp 1600s, hypoxic, cxr with pulm edema, also mod left pleural effusion - cont coreg - cont lasix 40 iv bid - strict I/os  # Pleural effusion Likely 2/2 chf, may be contributing to hypoxia - diagnostic/therapeutic thoracentesis ordered  # Hyperkalemia 5.6 today, 5.1 yesterday, may be hemolyzed - stat repeat - continue diuresis  # BPH - home flomax  # HTN Bp appropriate - cont home coreg - holding home losartan given hyperkalemia concern  # CAD # PAD Remote history cabg. Asymptomatic - cont home asa, statin  # ckd 3a Cr 1.5 bit above baseline of 1.2-1.3 - monitor while diuresing  DVT prophylaxis: lovenox Code Status: dnr Family Communication: no answer when daughter called today  Level of care: Progressive Status is: Inpatient Remains inpatient appropriate because: severity of illness    Consultants:  None thus far  Procedures: Thoracentesis pending  Antimicrobials:  none    Subjective: Reports no sob, feeling much better after food this morning  Objective: Vitals:   02/11/24 0430 02/11/24 0500 02/11/24 0530 02/11/24 0600  BP: 101/63 97/63 121/75 133/82  Pulse: 72 67 78 71  Resp: (!) 28 (!) 34 18 (!)  29  Temp:      TempSrc:      SpO2: (!) 89% (!) 89% 91% 94%   No intake or output data in the 24 hours ending 02/11/24 0839 There were no vitals filed for this visit.  Examination:  General exam: Appears calm and comfortable  Respiratory system: decreased breat sounds at bases, otherwise clear. Normal wob Cardiovascular system: S1 & S2 heard, RR, distant heart sounds Gastrointestinal system: Abdomen is  nondistended, soft and nontender.   Central nervous system: Alert and oriented. No focal neurological deficits. Extremities: Symmetric 5 x 5 power. Trace LE edema Skin: No rashes, lesions or ulcers Psychiatry: Judgement and insight appear normal. Mood & affect appropriate.     Data Reviewed: I have personally reviewed following labs and imaging studies  CBC: Recent Labs  Lab 02/08/24 1923 02/10/24 1854 02/11/24 0458  WBC 6.2 6.2 7.5  NEUTROABS  --  4.8  --   HGB 13.0 13.5 13.0  HCT 41.1 42.3 40.5  MCV 105.4* 103.4* 104.4*  PLT 180 184 188   Basic Metabolic Panel: Recent Labs  Lab 02/08/24 1923 02/10/24 1854 02/11/24 0458  NA 142 139 142  K 4.7 5.1 5.6*  CL 104 102 108  CO2 30 27 27   GLUCOSE 276* 345* 71  BUN 30* 38* 40*  CREATININE 1.21 1.50* 1.52*  CALCIUM 8.9 8.8* 8.3*  MG  --   --  2.3   GFR: Estimated Creatinine Clearance: 22.4 mL/min (A) (by C-G formula based on SCr of 1.52 mg/dL (H)). Liver Function Tests: Recent Labs  Lab 02/10/24 1854  AST 84*  ALT 65*  ALKPHOS 89  BILITOT 0.8  PROT 5.9*  ALBUMIN 3.7   No results for input(s): "LIPASE", "AMYLASE" in the last 168 hours. No results for input(s): "AMMONIA" in the last 168 hours. Coagulation Profile: No results for input(s): "INR", "PROTIME" in the last 168 hours. Cardiac Enzymes: No results for input(s): "CKTOTAL", "CKMB", "CKMBINDEX", "TROPONINI" in the last 168 hours. BNP (last 3 results) No results for input(s): "PROBNP" in the last 8760 hours. HbA1C: Recent Labs    02/10/24 2142  HGBA1C 10.1*   CBG: Recent Labs  Lab 02/10/24 1854 02/10/24 2155 02/10/24 2322 02/11/24 0741 02/11/24 0752  GLUCAP 302* 268* 209* 26* 117*   Lipid Profile: Recent Labs    02/11/24 0458  CHOL 99  HDL 56  LDLCALC 37  TRIG 28  CHOLHDL 1.8   Thyroid Function Tests: No results for input(s): "TSH", "T4TOTAL", "FREET4", "T3FREE", "THYROIDAB" in the last 72 hours. Anemia Panel: No results for input(s):  "VITAMINB12", "FOLATE", "FERRITIN", "TIBC", "IRON", "RETICCTPCT" in the last 72 hours. Urine analysis:    Component Value Date/Time   COLORURINE YELLOW (A) 02/08/2024 2025   APPEARANCEUR CLEAR (A) 02/08/2024 2025   LABSPEC 1.026 02/08/2024 2025   PHURINE 5.0 02/08/2024 2025   GLUCOSEU >=500 (A) 02/08/2024 2025   HGBUR NEGATIVE 02/08/2024 2025   BILIRUBINUR NEGATIVE 02/08/2024 2025   KETONESUR NEGATIVE 02/08/2024 2025   PROTEINUR 100 (A) 02/08/2024 2025   NITRITE NEGATIVE 02/08/2024 2025   LEUKOCYTESUR NEGATIVE 02/08/2024 2025   Sepsis Labs: @LABRCNTIP (procalcitonin:4,lacticidven:4)  ) Recent Results (from the past 240 hours)  Resp panel by RT-PCR (RSV, Flu A&B, Covid) Anterior Nasal Swab     Status: None   Collection Time: 02/10/24  6:54 PM   Specimen: Anterior Nasal Swab  Result Value Ref Range Status   SARS Coronavirus 2 by RT PCR NEGATIVE NEGATIVE Final    Comment: (NOTE)  SARS-CoV-2 target nucleic acids are NOT DETECTED.  The SARS-CoV-2 RNA is generally detectable in upper respiratory specimens during the acute phase of infection. The lowest concentration of SARS-CoV-2 viral copies this assay can detect is 138 copies/mL. A negative result does not preclude SARS-Cov-2 infection and should not be used as the sole basis for treatment or other patient management decisions. A negative result may occur with  improper specimen collection/handling, submission of specimen other than nasopharyngeal swab, presence of viral mutation(s) within the areas targeted by this assay, and inadequate number of viral copies(<138 copies/mL). A negative result must be combined with clinical observations, patient history, and epidemiological information. The expected result is Negative.  Fact Sheet for Patients:  BloggerCourse.com  Fact Sheet for Healthcare Providers:  SeriousBroker.it  This test is no t yet approved or cleared by the [place] FDA and  has been authorized for detection and/or diagnosis of SARS-CoV-2 by FDA under an Emergency Use Authorization (EUA). This EUA will remain  in effect (meaning this test can be used) for the duration of the COVID-19 declaration under Section 564(b)(1) of the Act, 21 U.S.C.section 360bbb-3(b)(1), unless the authorization is terminated  or revoked sooner.       Influenza A by PCR NEGATIVE NEGATIVE Final   Influenza B by PCR NEGATIVE NEGATIVE Final    Comment: (NOTE) The Xpert Xpress SARS-CoV-2/FLU/RSV plus assay is intended as an aid in the diagnosis of influenza from Nasopharyngeal swab specimens and should not be used as a sole basis for treatment. Nasal washings and aspirates are unacceptable for Xpert Xpress SARS-CoV-2/FLU/RSV testing.  Fact Sheet for Patients: BloggerCourse.com  Fact Sheet for Healthcare Providers: SeriousBroker.it  This test is not yet approved or cleared by the [place] FDA and has been authorized for detection and/or diagnosis of SARS-CoV-2 by FDA under an Emergency Use Authorization (EUA). This EUA will remain in effect (meaning this test can be used) for the duration of the COVID-19 declaration under Section 564(b)(1) of the Act, 21 U.S.C. section 360bbb-3(b)(1), unless the authorization is terminated or revoked.     Resp Syncytial Virus by PCR NEGATIVE NEGATIVE Final    Comment: (NOTE) Fact Sheet for Patients: BloggerCourse.com  Fact Sheet for Healthcare Providers: SeriousBroker.it  This test is not yet approved or cleared by the [place] FDA and has been authorized for detection and/or diagnosis of SARS-CoV-2 by FDA under an Emergency Use Authorization (EUA). This EUA will remain in effect (meaning this test can be used) for the duration of the COVID-19 declaration under Section 564(b)(1) of the Act, 21 U.S.C. section  360bbb-3(b)(1), unless the authorization is terminated or revoked.  Performed at [facility], [address] [place], [place] [address]          Radiology Studies: DG Chest Portable 1 View Result Date: 02/10/2024 CLINICAL DATA:  sob EXAM: PORTABLE CHEST 1 VIEW COMPARISON:  Chest x-ray 02/03/2024, CT chest 11/23/2017 FINDINGS: The heart and mediastinal contours are unchanged. Atherosclerotic plaque. Interval development of patchy airspace opacity of the left mid lung zone. Pulmonary edema. Interval increase in size of a small to moderate left pleural effusion. Grossly stable at least small right pleural effusion. Calcified subcentimeter nodules again noted overlying the left upper lobe. No pneumothorax. No acute osseous abnormality. IMPRESSION: 1. Pulmonary edema with likely superimposed infection of the left mid lung zone. 2. Interval increase in size of a small to moderate left pleural effusion. 3. Grossly stable at least small right pleural effusion. Electronically Signed  By: Tish Frederickson M.D.   On: 02/10/2024 20:35        Scheduled Meds:  aspirin EC  81 mg Oral Daily   atorvastatin  40 mg Oral Daily   carvedilol  3.125 mg Oral BID WC   enoxaparin (LOVENOX) injection  30 mg Subcutaneous Q24H   feeding supplement  237 mL Oral BID BM   furosemide  40 mg Intravenous Q12H   tamsulosin  0.4 mg Oral Daily   Continuous Infusions:   LOS: 1 day   CRITICAL CARE Performed by: Silvano Bilis   Total critical care time: 54 minutes  Critical care time was exclusive of separately billable procedures and treating other patients.  Critical care was necessary to treat or prevent imminent or life-threatening deterioration.  Critical care was time spent personally by me on the following activities: development of treatment plan with patient and/or surrogate as well as nursing, discussions with consultants, evaluation of patient's response to treatment, examination of patient,  obtaining history from patient or surrogate, ordering and performing treatments and interventions, ordering and review of laboratory studies, ordering and review of radiographic studies, pulse oximetry and re-evaluation of patient's condition.    Silvano Bilis, MD [facility]   If 7PM-7AM, please contact night-coverage [email or web address] Password [facility] 02/11/2024, 8:39 AM

## 2024-02-11 NOTE — ED Notes (Signed)
 RN to bedside. RT to bedside to assess pt due to low SPO2. MD made aware SPO2 low 80s. RN obtained CBG. CBG 26. RN gave 50ml D50 IVP. Pt appearing lethargic and unable to stay awake. MD paged.

## 2024-02-11 NOTE — ED Notes (Signed)
 Pt repositioned for comfort. Given new linens. Pending inpt bed.

## 2024-02-11 NOTE — ED Notes (Signed)
 [place] at bedside

## 2024-02-11 NOTE — ED Notes (Signed)
 Pt eating sandwich at this time. Denies complaints at this time.

## 2024-02-11 NOTE — ED Notes (Signed)
 RT called at this time to place pt on heated high flow nasal cannula for hypoxia on humidified nasal cannula at 15L.

## 2024-02-12 ENCOUNTER — Inpatient Hospital Stay

## 2024-02-12 DIAGNOSIS — I5023 Acute on chronic systolic (congestive) heart failure: Secondary | ICD-10-CM | POA: Diagnosis not present

## 2024-02-12 DIAGNOSIS — J90 Pleural effusion, not elsewhere classified: Secondary | ICD-10-CM | POA: Diagnosis not present

## 2024-02-12 LAB — CBG MONITORING, ED
Glucose-Capillary: 46 mg/dL — ABNORMAL LOW (ref 70–99)
Glucose-Capillary: 50 mg/dL — ABNORMAL LOW (ref 70–99)
Glucose-Capillary: 216 mg/dL — ABNORMAL HIGH (ref 70–99)
Glucose-Capillary: 47 mg/dL — ABNORMAL LOW (ref 70–99)
Glucose-Capillary: 185 mg/dL — ABNORMAL HIGH (ref 70–99)
Glucose-Capillary: 178 mg/dL — ABNORMAL HIGH (ref 70–99)
Glucose-Capillary: 146 mg/dL — ABNORMAL HIGH (ref 70–99)

## 2024-02-12 LAB — BASIC METABOLIC PANEL
Anion gap: 10 (ref 5–15)
BUN: 43 mg/dL — ABNORMAL HIGH (ref 8–23)
CO2: 26 mmol/L (ref 22–32)
Calcium: 8.1 mg/dL — ABNORMAL LOW (ref 8.9–10.3)
Chloride: 106 mmol/L (ref 98–111)
Creatinine, Ser: 1.65 mg/dL — ABNORMAL HIGH (ref 0.61–1.24)
GFR, Estimated: 38 mL/min — ABNORMAL LOW (ref >60)
Glucose, Bld: 50 mg/dL — ABNORMAL LOW (ref 70–99)
Potassium: 4.5 mmol/L (ref 3.5–5.1)
Sodium: 142 mmol/L (ref 135–145)

## 2024-02-12 LAB — GLUCOSE, CAPILLARY: Glucose-Capillary: 153 mg/dL — ABNORMAL HIGH (ref 70–99)

## 2024-02-12 LAB — CBC
HCT: 40.7 % (ref 39.0–52.0)
Hemoglobin: 12.9 g/dL — ABNORMAL LOW (ref 13.0–17.0)
MCH: 33.3 pg (ref 26.0–34.0)
MCHC: 31.7 g/dL (ref 30.0–36.0)
MCV: 105.2 fL — ABNORMAL HIGH (ref 80.0–100.0)
Platelets: 164 10*3/uL (ref 150–400)
RBC: 3.87 MIL/uL — ABNORMAL LOW (ref 4.22–5.81)
RDW: 13.1 % (ref 11.5–15.5)
WBC: 8 10*3/uL (ref 4.0–10.5)
nRBC: 0 % (ref 0.0–0.2)

## 2024-02-12 LAB — CYTOLOGY - NON PAP

## 2024-02-12 MED ORDER — TECHNETIUM TO 99M ALBUMIN AGGREGATED
4.3400 | Freq: Once | INTRAVENOUS | Status: AC | PRN
  Administered 2024-02-12: 4.34 via INTRAVENOUS

## 2024-02-12 MED ORDER — DEXTROSE 10 % IV SOLN: INTRAVENOUS | Status: DC

## 2024-02-12 MED ORDER — DEXTROSE 50 % IV SOLN
25.0000 g | Freq: Once | INTRAVENOUS | Status: AC
  Filled 2024-02-12: qty 50

## 2024-02-12 MED ORDER — FUROSEMIDE 10 MG/ML IJ SOLN
60.0000 mg | Freq: Two times a day (BID) | INTRAMUSCULAR | Status: DC
  Administered 2024-02-12 (×2): 60 mg via INTRAVENOUS
  Filled 2024-02-12: qty 6
  Filled 2024-02-12: qty 8

## 2024-02-12 NOTE — Progress Notes (Signed)
 PT Cancellation Note  Patient Details Name: Peter Becker MRN: [id number] DOB: Aug 12, 1929   Cancelled Treatment:    Reason Eval/Treat Not Completed: Other (comment). Consult received and chart reviewed. Pt pending NM pul perfusion testing this AM. Will hold off on evaluation until medically cleared for possible PE.   Aariz Maish 02/12/2024, 8:27 AM Elizabeth Palau, PT, DPT, GCS [phone]

## 2024-02-12 NOTE — Consult Note (Signed)
 Advanced Heart Failure Team Consult Note   Primary Physician: Danella Penton, MD Cardiologist:  Talbert Forest PA-C  Reason for Consultation: Heart failure  HPI:    Peter Becker is seen today for evaluation of heart failure at the request of Dr. Minus Breeding Peter Becker is a 89 y.o. male with a hx of HFrEF, HTN, DM2, CAD s/p CABG x 3 in 2007, peripheral vascular disease, CVA in 2018, , anxiety, CKD stage III  Has followed with Talbert Forest in our Cardiology office and Clarisa Kindred in our HF Clinic.    Patient was admitted March 2024 with shortness of breath not responsive to outpatient treatment.  Troponin was elevated at 205, BNP greater than 1000.  Patient was started on IV Lasix and IV heparin.  Echo EF 35-40% + LVH Patient underwent thoracentesis of the left pleural effusion that did not show any infection. Patient was transitioned to GDMT for CHF. Plan not to pursue further ischemic testing given age and other comormidities.   Patient was last seen 05/2023 by Ms Fransico Michael and was doing OK. Medical management was continued.   He lives at home with hi daughter and she reports he has been rather independent. Making breakfast for himself and handling his own ADLs. Over past few weeks has not been feeling very well. Seen several times in ER for hyperglycemia.   He tells me that he went to Urgent Care this time for his elevated sugars but was told to come to ED.   Presented to ED on 02/10/24 and was markedly SOB with sats in 50s. Flu and COVID negative. Started on Bipap.   BNP 1652.9, troponin 193, negative PCR for COVID, flu and RSV, procalcitonin <0.10, blood pressure 102/71, heart rate 85, RR 33 --> 24.  Chest x-ray showed pulmonary edema and pleural effusion bilaterally (left side is worse than the right).    Given IV lasix and sent for right thoracentesis with 900 cc out. (Transudative)  Has been persistently hypoglycemic in ER.   Echo 31225 EF 25-30% with biventricular hypertrophy RV  mildly reduced AV/M thickened Personally reviewed   Home Medications Prior to Admission medications   Medication Sig Start Date End Date Taking? Authorizing Provider  acetaminophen (TYLENOL) 325 MG tablet Take 2 tablets (650 mg total) by mouth every 6 (six) hours as needed for mild pain or fever. 02/13/23  Yes Loyce Dys, MD  aspirin EC 81 MG tablet Take 1 tablet (81 mg total) by mouth daily. Swallow whole. 02/14/23  Yes Loyce Dys, MD  atorvastatin (LIPITOR) 40 MG tablet Take 1 tablet (40 mg total) by mouth daily. 02/14/23  Yes Loyce Dys, MD  carvedilol (COREG) 3.125 MG tablet Take 1 tablet (3.125 mg total) by mouth 2 (two) times daily with a meal. 02/13/23  Yes Djan, Scarlette Calico, MD  cyanocobalamin (,VITAMIN B-12,) 1000 MCG/ML injection INJ 1 ML IM Q 14 DAYS 12/29/17  Yes [provider]  dapagliflozin propanediol (FARXIGA) 10 MG TABS tablet Take 1 tablet (10 mg total) by mouth daily before breakfast. Please call [phone] to schedule an appointment prior to next refill request. Thank you. 12/15/23  Yes Furth, Cadence H, PA-C  fluticasone (FLONASE) 50 MCG/ACT nasal spray Place 1 spray into both nostrils daily. 07/21/23  Yes [provider]  furosemide (LASIX) 20 MG tablet Take 1 tablet (20 mg total) by mouth daily. Please call [phone] to schedule an appointment prior to next refill request. Thank you. 12/15/23  Yes Furth, Cadence H, PA-C  glipiZIDE (GLUCOTROL XL) 10 MG 24 hr tablet Take 1 tablet by mouth daily. 12/17/23  Yes [provider]  losartan (COZAAR) 25 MG tablet Take 0.5 tablets (12.5 mg total) by mouth daily. 02/13/23  Yes Loyce Dys, MD  pioglitazone (ACTOS) 15 MG tablet Take 15 mg by mouth daily.   Yes [provider]  silver sulfADIAZINE (SILVADENE) 1 % cream Apply 1 Application topically 2 (two) times daily. 07/28/23 07/27/24 Yes [provider]  tamsulosin (FLOMAX) 0.4 MG CAPS capsule Take 0.4 mg by mouth daily.   Yes  [provider]  glimepiride (AMARYL) 4 MG tablet Take 4 mg by mouth daily with breakfast. Patient not taking: Reported on 02/11/2024    [provider]    Past Medical History: Past Medical History:  Diagnosis Date   CHF (congestive heart failure) (HCC)    Chronic kidney disease    Coronary artery disease    Diabetes mellitus without complication (HCC)    Hx of CABG    Hypertension    Myocardial infarct (HCC)    hx 3 MI. stents placed in 1995   PVD (peripheral vascular disease) (HCC)    Stroke (HCC) 2018   had R deficits after stroke, was on hospice, then deficits resolved    Past Surgical History: Past Surgical History:  Procedure Laterality Date   CARPAL TUNNEL RELEASE Left    CHOLECYSTECTOMY     EYE SURGERY     heart stent     triple bypass   HEMORRHOID SURGERY     TONSILLECTOMY      Family History: Family History  Problem Relation Age of Onset   Heart attack Father     Social History: Social History   Socioeconomic History   Marital status: Widowed    Spouse name: Not on file   Number of children: Not on file   Years of education: Not on file   Highest education level: Not on file  Occupational History   Not on file  Tobacco Use   Smoking status: Former   Smokeless tobacco: Never  Vaping Use   Vaping status: Never Used  Substance and Sexual Activity   Alcohol use: No   Drug use: No   Sexual activity: Not Currently  Other Topics Concern   Not on file  Social History Narrative   Not on file   Social Drivers of Health   Financial Resource Strain: Low Risk  (09/02/2023)   Received from [facility]   Overall Financial Resource Strain (CARDIA)    Difficulty of Paying Living Expenses: Not hard at all  Food Insecurity: No Food Insecurity (09/02/2023)   Received from [facility]   Hunger Vital Sign    Worried About Running Out of Food in the Last Year: Never true    Ran Out of Food in the Last  Year: Never true  Transportation Needs: No Transportation Needs (09/02/2023)   Received from [facility] - Transportation    In the past 12 months, has lack of transportation kept you from medical appointments or from getting medications?: No    Lack of Transportation (Non-Medical): No  Physical Activity: Not on file  Stress: Not on file  Social Connections: Not on file    Allergies:  Allergies  Allergen Reactions   Tape Other (See Comments)    SKIN IS VERY THIN AND TEARS AND BRUISES EASILY; Please use an  alternative!!    Objective:    Vital Signs:   Temp:  [97.7 F (36.5 C)-99.6 F (37.6 C)] 97.8 F (36.6 C) (03/13 1034) Pulse Rate:  [70-92] 78 (03/13 1030) Resp:  [11-25] 11 (03/13 1030) BP: (97-135)/(54-77) 97/59 (03/13 1030) SpO2:  [87 %-95 %] 95 % (03/13 1030) FiO2 (%):  [93 %-95 %] 93 % (03/13 0244)    Weight change: There were no vitals filed for this visit.  Intake/Output:   Intake/Output Summary (Last 24 hours) at 02/12/2024 1055 Last data filed at 02/12/2024 6045 Gross per 24 hour  Intake --  Output 1200 ml  Net -1200 ml      Physical Exam    General:  Elderly weak on high flow O2 HEENT: normal Neck: supple. JVP 6-7. Carotids 2+ bilat; no bruits. No lymphadenopathy or thyromegaly appreciated. Cor: Regular rate & rhythm. 2/6 SEM LUSB Lungs: decreased at bases Abdomen: soft, nontender, nondistended. No hepatosplenomegaly. No bruits or masses. Good bowel sounds. Extremities: no cyanosis, clubbing, rash, edema Neuro: alert communicative. Difficult historian cranial nerves grossly intact. moves all 4 extremities w/o difficulty. Affect pleasant   Telemetry   Sinus 70s Personally reviewed  EKG    Sinus with PACs. Normal volts. Anterolateral Qs Personally reviewed  Labs   Basic Metabolic Panel: Recent Labs  Lab 02/08/24 1923 02/10/24 1854 02/11/24 0458 02/11/24 0855 02/12/24 0430  NA 142 139 142  --  142  K 4.7 5.1  5.6* 4.3 4.5  CL 104 102 108  --  106  CO2 30 27 27   --  26  GLUCOSE 276* 345* 71  --  50*  BUN 30* 38* 40*  --  43*  CREATININE 1.21 1.50* 1.52*  --  1.65*  CALCIUM 8.9 8.8* 8.3*  --  8.1*  MG  --   --  2.3  --   --     Liver Function Tests: Recent Labs  Lab 02/10/24 1854  AST 84*  ALT 65*  ALKPHOS 89  BILITOT 0.8  PROT 5.9*  ALBUMIN 3.7   No results for input(s): "LIPASE", "AMYLASE" in the last 168 hours. No results for input(s): "AMMONIA" in the last 168 hours.  CBC: Recent Labs  Lab 02/08/24 1923 02/10/24 1854 02/11/24 0458 02/12/24 0430  WBC 6.2 6.2 7.5 8.0  NEUTROABS  --  4.8  --   --   HGB 13.0 13.5 13.0 12.9*  HCT 41.1 42.3 40.5 40.7  MCV 105.4* 103.4* 104.4* 105.2*  PLT 180 184 188 164    Cardiac Enzymes: No results for input(s): "CKTOTAL", "CKMB", "CKMBINDEX", "TROPONINI" in the last 168 hours.  BNP: BNP (last 3 results) Recent Labs    02/10/24 1854  BNP 1,652.9*    ProBNP (last 3 results) No results for input(s): "PROBNP" in the last 8760 hours.   CBG: Recent Labs  Lab 02/12/24 0416 02/12/24 0434 02/12/24 0514 02/12/24 0634 02/12/24 0752  GLUCAP 46* 47* 216* 185* 178*    Coagulation Studies: No results for input(s): "LABPROT", "INR" in the last 72 hours.   Imaging   ECHOCARDIOGRAM COMPLETE Result Date: 02/11/2024    ECHOCARDIOGRAM REPORT   Patient Name:   Peter Becker Date of Exam: 02/11/2024 Medical Rec #:  [id number]     Height:       68.0 in Accession #:    [id number]    Weight:       115.0 lb Date of Birth:  Oct 24, 1929     BSA:  1.615 m Patient Age:    89 years      BP:           133/82 mmHg Patient Gender: M             HR:           84 bpm. Exam Location:  [facility] Procedure: 2D Echo, Cardiac Doppler, Color Doppler and Strain Analysis (Both            Spectral and Color Flow Doppler were utilized during procedure). Indications:     CHF  History:         Patient has prior history of Echocardiogram examinations, most                   recent 02/09/2023. CHF, Angina, CAD and Previous Myocardial                  Infarction, Prior CABG, PAD and COPD, Signs/Symptoms:Shortness                  of Breath; Risk Factors:Hypertension, Diabetes and                  Dyslipidemia. CKD.  Sonographer:     Mikki Harbor Referring Phys:  [id number] Lorretta Harp Diagnosing Phys: Julien Nordmann MD  Sonographer Comments: Image acquisition challenging due to COPD. Global longitudinal strain was attempted. IMPRESSIONS  1. Left ventricular ejection fraction, by estimation, is 25 to 30%. Left ventricular ejection fraction by PLAX is 27 %. The left ventricle has severely decreased function. The left ventricle demonstrates global hypokinesis, akinesis of the distal anteroseptal and apical regions. There is moderate left ventricular hypertrophy. Left ventricular diastolic parameters are consistent with Grade I diastolic dysfunction (impaired relaxation). The average left ventricular global longitudinal strain is -3.8 %. The global longitudinal strain is abnormal.  2. Right ventricular systolic function is moderately reduced. The right ventricular size is normal. Tricuspid regurgitation signal is inadequate for assessing PA pressure.  3. The mitral valve is normal in structure. Mild to moderate mitral valve regurgitation. No evidence of mitral stenosis.  4. The aortic valve is tricuspid. There is mild calcification of the aortic valve. Aortic valve regurgitation is not visualized. Aortic valve sclerosis/calcification is present, without any evidence of aortic stenosis. Aortic valve mean gradient measures 4.0 mmHg.  5. The inferior vena cava is normal in size with <50% respiratory variability, suggesting right atrial pressure of 8 mmHg. FINDINGS  Left Ventricle: Left ventricular ejection fraction, by estimation, is 25 to 30%. Left ventricular ejection fraction by PLAX is 27 %. The left ventricle has severely decreased function. The left ventricle demonstrates global  hypokinesis. The average left  ventricular global longitudinal strain is -3.8 %. Strain was performed and the global longitudinal strain is abnormal. The left ventricular internal cavity size was normal in size. There is moderate left ventricular hypertrophy. Left ventricular diastolic parameters are consistent with Grade I diastolic dysfunction (impaired relaxation). Right Ventricle: The right ventricular size is normal. No increase in right ventricular wall thickness. Right ventricular systolic function is moderately reduced. Tricuspid regurgitation signal is inadequate for assessing PA pressure. Left Atrium: Left atrial size was normal in size. Right Atrium: Right atrial size was normal in size. Pericardium: There is no evidence of pericardial effusion. Mitral Valve: The mitral valve is normal in structure. Mild to moderate mitral valve regurgitation. No evidence of mitral valve stenosis. MV peak gradient, 2.6 mmHg. The mean mitral valve gradient is 1.0 mmHg. Tricuspid  Valve: The tricuspid valve is normal in structure. Tricuspid valve regurgitation is mild . No evidence of tricuspid stenosis. Aortic Valve: The aortic valve is tricuspid. There is mild calcification of the aortic valve. Aortic valve regurgitation is not visualized. Aortic valve sclerosis/calcification is present, without any evidence of aortic stenosis. Aortic valve mean gradient measures 4.0 mmHg. Aortic valve peak gradient measures 6.2 mmHg. Aortic valve area, by VTI measures 2.12 cm. Pulmonic Valve: The pulmonic valve was normal in structure. Pulmonic valve regurgitation is mild. No evidence of pulmonic stenosis. Aorta: The aortic root is normal in size and structure. Venous: The inferior vena cava is normal in size with less than 50% respiratory variability, suggesting right atrial pressure of 8 mmHg. IAS/Shunts: No atrial level shunt detected by color flow Doppler. Additional Comments: 3D was performed not requiring image post processing on  an independent workstation and was indeterminate. There is a small pleural effusion in the left lateral region.  LEFT VENTRICLE PLAX 2D LV EF:         Left            Diastology                ventricular     LV e' medial:    3.70 cm/s                ejection        LV E/e' medial:  20.2                fraction by     LV e' lateral:   4.90 cm/s                PLAX is 27      LV E/e' lateral: 15.3                %. LVIDd:         4.10 cm         2D Longitudinal LVIDs:         3.60 cm         Strain LV PW:         1.60 cm         2D Strain GLS   -3.8 % LV IVS:        1.70 cm         Avg: LVOT diam:     2.10 cm LV SV:         49 LV SV Index:   30 LVOT Area:     3.46 cm  LV Volumes (MOD) LV vol d, MOD    78.6 ml A2C: LV vol d, MOD    49.6 ml A4C: LV vol s, MOD    50.2 ml A2C: LV vol s, MOD    36.3 ml A4C: LV SV MOD A2C:   28.4 ml LV SV MOD A4C:   49.6 ml LV SV MOD BP:    21.1 ml RIGHT VENTRICLE RV Basal diam:  4.15 cm RV Mid diam:    2.80 cm RV S prime:     6.53 cm/s LEFT ATRIUM              Index        RIGHT ATRIUM           Index LA diam:        3.70 cm  2.29 cm/m   RA Area:     21.90 cm LA Vol (A2C):   117.0 ml  72.43 ml/m  RA Volume:   77.10 ml  47.73 ml/m LA Vol (A4C):   42.9 ml  26.56 ml/m LA Biplane Vol: 70.9 ml  43.89 ml/m  AORTIC VALVE                    PULMONIC VALVE AV Area (Vmax):    2.23 cm     PV Vmax:       0.68 m/s AV Area (Vmean):   1.83 cm     PV Peak grad:  1.8 mmHg AV Area (VTI):     2.12 cm AV Vmax:           125.00 cm/s AV Vmean:          94.000 cm/s AV VTI:            0.230 m AV Peak Grad:      6.2 mmHg AV Mean Grad:      4.0 mmHg LVOT Vmax:         80.40 cm/s LVOT Vmean:        49.800 cm/s LVOT VTI:          0.141 m LVOT/AV VTI ratio: 0.61  AORTA Ao Root diam: 3.30 cm MITRAL VALVE MV Area (PHT): 4.80 cm    SHUNTS MV Area VTI:   2.19 cm    Systemic VTI:  0.14 m MV Peak grad:  2.6 mmHg    Systemic Diam: 2.10 cm MV Mean grad:  1.0 mmHg MV Vmax:       0.80 m/s MV Vmean:      57.7 cm/s MV  Decel Time: 158 msec MV E velocity: 74.80 cm/s MV A velocity: 74.20 cm/s MV E/A ratio:  1.01 Julien Nordmann MD Electronically signed by Julien Nordmann MD Signature Date/Time: 02/11/2024/4:45:57 PM    Final    DG Chest Port 1 View Result Date: 02/11/2024 CLINICAL DATA:  [id number] Pleural effusion on left [id number] Status post thoracentesis [id number] Pleural effusion on right [id number] EXAM: PORTABLE CHEST 1 VIEW COMPARISON:  02/10/2024. FINDINGS: There are diffuse alveolar and interstitial opacities throughout the left lung with lower lobe gradient, which appears significantly increased since the prior study from yesterday. Left lower hemithorax opacity appears essentially unchanged. There is small left pleural effusion, grossly similar to the prior study. Significant interval decrease in the right pleural effusion, status post thoracentesis. No pneumothorax. Evaluation of cardiomediastinal silhouette is nondiagnostic due to left lower hemithorax opacification. No acute osseous abnormalities. The soft tissues are within normal limits. IMPRESSION: 1. Significant interval decrease in the right pleural effusion, status post thoracentesis. No pneumothorax. 2. Significant interval increase in the diffuse alveolar and interstitial opacities throughout the left lung with lower lobe gradient. Findings are nonspecific and differential diagnosis includes combination of asymmetric pulmonary edema with or without aspiration pneumonia. Pre-existing left lower hemithorax opacity appears essentially unchanged. Electronically Signed   By: Jules Schick M.D.   On: 02/11/2024 14:44   US THORACENTESIS ASP PLEURAL SPACE W/IMG GUIDE Result Date: 02/11/2024 INDICATION: 89 year old male with currently in the ED with right pleural effusion on BiPAP for diagnostic and therapeutic thoracentesis. EXAM: ULTRASOUND GUIDED RIGHT THORACENTESIS MEDICATIONS: 10 mL 1% lidocaine COMPLICATIONS: None immediate. PROCEDURE: An ultrasound guided  thoracentesis was thoroughly discussed with the patient and questions answered. The benefits, risks, alternatives and complications were also discussed. The patient understands and wishes to proceed with the procedure. Written consent was obtained. Ultrasound was performed to localize and mark an adequate pocket  of fluid in the right chest. The area was then prepped and draped in the normal sterile fashion. 1% Lidocaine was used for local anesthesia. Under ultrasound guidance a 8 Fr Safe-T-Centesis catheter was introduced. Thoracentesis was performed. The catheter was removed and a dressing applied. FINDINGS: A total of approximately 900 mL of clear, dark yellow fluid was removed. Samples were sent to the laboratory as requested by the clinical team. IMPRESSION: Successful ultrasound guided right thoracentesis yielding 900 mL of pleural fluid. Performed By Theresa Mulligan, PA-C Electronically Signed   By: Malachy Moan M.D.   On: 02/11/2024 14:25     Medications:     Current Medications:  aspirin EC  81 mg Oral Daily   atorvastatin  40 mg Oral Daily   carvedilol  3.125 mg Oral BID WC   enoxaparin (LOVENOX) injection  30 mg Subcutaneous Q24H   feeding supplement  237 mL Oral BID BM   furosemide  60 mg Intravenous BID   tamsulosin  0.4 mg Oral Daily    Infusions:  dextrose 30 mL/hr at 02/12/24 0449      Assessment/Plan    1: Acute chronic heart failure with reduced ejection fraction- - echo 02/09/23: EF 35-40%, moderate LVH, mild/ moderate MR, moderate TR - echo 01/1224 EF 25-30% with biventricular hypertrophy RV mildly reduced AV/M thickened Personally reviewed  - admitted with marked volume overload and respiratory distress - Respiratory status improved with IV lasix and right thoracentesis - Echo highly suggestive of cardiac amyloid  - Will continue IV lasix - Hold carvedilol with hypoglycemia and acute decompensation - Check myeloma panel - If myeloma panel negative can  consider outpatient w/u for TTR amyloid. Though benefit of tafamadis at this stage likely marginal  2. Pleural effusions, recurrent  - s/p right thora - will follow - can consider Pleurex tube as needed If recurs owed sodium 140, potassium 3.9, creatinine 1.37 & GFR 48  3. Acute hypoxic respiratory failure - due to #1&#2 - improving - continue diuresis   4. CAD - CABG X3 in 2007 - no evidence ACS - no angina - not candidate for further invasive testing at this point  5. DM2 poorly controlled - HgBa1c 10.1% -  now with hypoglycemia - management per primary team - hold b-blocker  6. AKI - SCR 1.2 -> 1.6 - likely cardiorenal +/- amyloid.  -watch with diuresis  7. GOC - he has been pretty functional at home but worse lately. If not improving may benefit from Palliative Care involvement  I d/w his daughter Lafonda Mosses by phone.   Length of Stay: 2  Arvilla Meres, MD  02/12/2024, 10:55 AM  Advanced Heart Failure Team Pager [phone] (M-F; 7a - 5p)  Please contact [facility] for night-coverage after hours (4p -7a ) and weekends on [email or web address]

## 2024-02-12 NOTE — ED Notes (Signed)
 Patient to Nuc Med with Gearldine Bienenstock, RN.

## 2024-02-12 NOTE — ED Notes (Signed)
 Patient eating breakfast tray at this time.

## 2024-02-12 NOTE — ED Notes (Signed)
 Patient back from NM scan. Patient placed in new gown and given new bedding. Placed in new brief. Patient placed back on the HFN.

## 2024-02-12 NOTE — Progress Notes (Signed)
 PROGRESS NOTE    Peter Becker  JXB:[id number] DOB: June 08, 1929 DOA: 02/10/2024 PCP: Danella Penton, MD  Outpatient Specialists: cardiology    Brief Narrative:   From admission h and p  Peter Becker is a 89 y.o. male with medical history significant of sCHF with EF 35-40%, HTN, HLD, DM, CAD, CABG, PVD, stroke, anxiety, CKD-3a, who presents with SOB.   Pt states that he has shortness of breath in the past several days, which has been progressively worsening.  Patient has mild dry cough, no chest pain, fever or chills.  Patient was found to have oxygen desaturation to 54% on room air, with acute respiratory distress, difficulty speaking in full sentence. Initially started on high flow nasal cannula oxygen, and then titrated down to 5 L oxygen with 94% of saturation.  Patient does not have nausea, vomiting, diarrhea or abdominal pain.  No symptoms of UTI.  No fever or chills.  Patient states that his blood sugar has been elevated in the past several weeks. He has tried to make an appointment with Endocrinologist but they "have to review his chart" before scheduling an appointment  Assessment & Plan:   Principal Problem:   Acute on chronic systolic CHF (congestive heart failure) (HCC) Active Problems:   Acute respiratory failure with hypoxia (HCC)   CAD (coronary artery disease)   Myocardial injury   Essential hypertension   Hyperlipidemia   Ischemic stroke (HCC)   Type II diabetes mellitus with renal manifestations (HCC)   Chronic kidney disease, stage 3a (HCC)   BPH (benign prostatic hyperplasia)   Abnormal LFTs   Protein-calorie malnutrition, severe (HCC)   PAD (peripheral artery disease) (HCC)   # Hyperglycemia # T2DM Despite what h and p says, patient reports (and initial ED provider report) says patient presented with concern for hyperglycemia. A1c is in the 10s. Here intermittent hypoglycemia - SSI sensitive   - glucose prn - holding home glipizide, pioglitazone  #  Hypoxia Likely 2/2 decompensated chf. Relatively asymptomatic but requiring high flow to maintain his sats. No dyspnea, low procal. Trops are int he 200s but no chest pain, no overt ischemic changes on EKG. Does have pleural effusion as well that was drained. Dimer is elevated - HF [place]  - f/u v/q scan  # biventricular HFrEF, decompensated Bnp 1600s, hypoxic, cxr with pulm edema, also mod left pleural effusion. EF 25-30 which is a decline from last check, also RV dysfunction. - cont coreg - advanced heart failure team consulted, defer therapeutics to them.   - strict I/os - palliative consult - prognosis is poor  # Pleural effusion Likely 2/2 chf, may be contributing to hypoxia. diagnostic/therapeutic thoracentesis performed on 3/12, out 900 ml. Consistent with transudate likely from decompensated chf.  # BPH - home flomax  # HTN Bp low normal - cont home coreg for now - holding home losartan   # CAD # PAD Remote history cabg. Asymptomatic - cont home asa, statin  # ckd 3a Cr 1.5 bit above baseline of 1.2-1.3 - monitor while diuresing  DVT prophylaxis: lovenox Code Status: dnr Family Communication: daughter updated telephonically 3/13  Level of care: Progressive Status is: Inpatient Remains inpatient appropriate because: severity of illness    Consultants:  Advanced heart failure team  Procedures: Thoracentesis 3/12  Antimicrobials:  none    Subjective: Breathing stable, no pain or other complaints.  Objective: Vitals:   02/12/24 1034 02/12/24 1040 02/12/24 1100 02/12/24 1300  BP:   (!) 100/54  Pulse:   74   Resp:   (!) 25   Temp: 97.8 F (36.6 C)   98.5 F (36.9 C)  TempSrc: Axillary   Rectal  SpO2:  94% 95%     Intake/Output Summary (Last 24 hours) at 02/12/2024 1304 Last data filed at 02/12/2024 1258 Gross per 24 hour  Intake 233.32 ml  Output 1200 ml  Net -966.68 ml   There were no vitals filed for this visit.  Examination:  General  exam: Appears calm and comfortable  Respiratory system: decreased breat sounds at bases, otherwise clear. Normal wob Cardiovascular system: S1 & S2 heard, RR, distant heart sounds Gastrointestinal system: Abdomen is nondistended, soft and nontender.   Central nervous system: Alert and oriented. No focal neurological deficits. Extremities: Symmetric 5 x 5 power. Trace LE edema Skin: No rashes, lesions or ulcers Psychiatry: Judgement and insight appear normal. Mood & affect appropriate.     Data Reviewed: I have personally reviewed following labs and imaging studies  CBC: Recent Labs  Lab 02/08/24 1923 02/10/24 1854 02/11/24 0458 02/12/24 0430  WBC 6.2 6.2 7.5 8.0  NEUTROABS  --  4.8  --   --   HGB 13.0 13.5 13.0 12.9*  HCT 41.1 42.3 40.5 40.7  MCV 105.4* 103.4* 104.4* 105.2*  PLT 180 184 188 164   Basic Metabolic Panel: Recent Labs  Lab 02/08/24 1923 02/10/24 1854 02/11/24 0458 02/11/24 0855 02/12/24 0430  NA 142 139 142  --  142  K 4.7 5.1 5.6* 4.3 4.5  CL 104 102 108  --  106  CO2 30 27 27   --  26  GLUCOSE 276* 345* 71  --  50*  BUN 30* 38* 40*  --  43*  CREATININE 1.21 1.50* 1.52*  --  1.65*  CALCIUM 8.9 8.8* 8.3*  --  8.1*  MG  --   --  2.3  --   --    GFR: Estimated Creatinine Clearance: 20.7 mL/min (A) (by C-G formula based on SCr of 1.65 mg/dL (H)). Liver Function Tests: Recent Labs  Lab 02/10/24 1854  AST 84*  ALT 65*  ALKPHOS 89  BILITOT 0.8  PROT 5.9*  ALBUMIN 3.7   No results for input(s): "LIPASE", "AMYLASE" in the last 168 hours. No results for input(s): "AMMONIA" in the last 168 hours. Coagulation Profile: No results for input(s): "INR", "PROTIME" in the last 168 hours. Cardiac Enzymes: No results for input(s): "CKTOTAL", "CKMB", "CKMBINDEX", "TROPONINI" in the last 168 hours. BNP (last 3 results) No results for input(s): "PROBNP" in the last 8760 hours. HbA1C: Recent Labs    02/10/24 2142  HGBA1C 10.1*   CBG: Recent Labs  Lab  02/12/24 0416 02/12/24 0434 02/12/24 0514 02/12/24 0634 02/12/24 0752  GLUCAP 46* 47* 216* 185* 178*   Lipid Profile: Recent Labs    02/11/24 0458  CHOL 99  HDL 56  LDLCALC 37  TRIG 28  CHOLHDL 1.8   Thyroid Function Tests: No results for input(s): "TSH", "T4TOTAL", "FREET4", "T3FREE", "THYROIDAB" in the last 72 hours. Anemia Panel: No results for input(s): "VITAMINB12", "FOLATE", "FERRITIN", "TIBC", "IRON", "RETICCTPCT" in the last 72 hours. Urine analysis:    Component Value Date/Time   COLORURINE YELLOW (A) 02/08/2024 2025   APPEARANCEUR CLEAR (A) 02/08/2024 2025   LABSPEC 1.026 02/08/2024 2025   PHURINE 5.0 02/08/2024 2025   GLUCOSEU >=500 (A) 02/08/2024 2025   HGBUR NEGATIVE 02/08/2024 2025   BILIRUBINUR NEGATIVE 02/08/2024 2025   KETONESUR NEGATIVE 02/08/2024 2025  PROTEINUR 100 (A) 02/08/2024 2025   NITRITE NEGATIVE 02/08/2024 2025   LEUKOCYTESUR NEGATIVE 02/08/2024 2025   Sepsis Labs: @LABRCNTIP (procalcitonin:4,lacticidven:4)  ) Recent Results (from the past 240 hours)  Resp panel by RT-PCR (RSV, Flu A&B, Covid) Anterior Nasal Swab     Status: None   Collection Time: 02/10/24  6:54 PM   Specimen: Anterior Nasal Swab  Result Value Ref Range Status   SARS Coronavirus 2 by RT PCR NEGATIVE NEGATIVE Final    Comment: (NOTE) SARS-CoV-2 target nucleic acids are NOT DETECTED.  The SARS-CoV-2 RNA is generally detectable in upper respiratory specimens during the acute phase of infection. The lowest concentration of SARS-CoV-2 viral copies this assay can detect is 138 copies/mL. A negative result does not preclude SARS-Cov-2 infection and should not be used as the sole basis for treatment or other patient management decisions. A negative result may occur with  improper specimen collection/handling, submission of specimen other than nasopharyngeal swab, presence of viral mutation(s) within the areas targeted by this assay, and inadequate number of  viral copies(<138 copies/mL). A negative result must be combined with clinical observations, patient history, and epidemiological information. The expected result is Negative.  Fact Sheet for Patients:  BloggerCourse.com  Fact Sheet for Healthcare Providers:  SeriousBroker.it  This test is no t yet approved or cleared by the [place] FDA and  has been authorized for detection and/or diagnosis of SARS-CoV-2 by FDA under an Emergency Use Authorization (EUA). This EUA will remain  in effect (meaning this test can be used) for the duration of the COVID-19 declaration under Section 564(b)(1) of the Act, 21 U.S.C.section 360bbb-3(b)(1), unless the authorization is terminated  or revoked sooner.       Influenza A by PCR NEGATIVE NEGATIVE Final   Influenza B by PCR NEGATIVE NEGATIVE Final    Comment: (NOTE) The Xpert Xpress SARS-CoV-2/FLU/RSV plus assay is intended as an aid in the diagnosis of influenza from Nasopharyngeal swab specimens and should not be used as a sole basis for treatment. Nasal washings and aspirates are unacceptable for Xpert Xpress SARS-CoV-2/FLU/RSV testing.  Fact Sheet for Patients: BloggerCourse.com  Fact Sheet for Healthcare Providers: SeriousBroker.it  This test is not yet approved or cleared by the [place] FDA and has been authorized for detection and/or diagnosis of SARS-CoV-2 by FDA under an Emergency Use Authorization (EUA). This EUA will remain in effect (meaning this test can be used) for the duration of the COVID-19 declaration under Section 564(b)(1) of the Act, 21 U.S.C. section 360bbb-3(b)(1), unless the authorization is terminated or revoked.     Resp Syncytial Virus by PCR NEGATIVE NEGATIVE Final    Comment: (NOTE) Fact Sheet for Patients: BloggerCourse.com  Fact Sheet for Healthcare  Providers: SeriousBroker.it  This test is not yet approved or cleared by the [place] FDA and has been authorized for detection and/or diagnosis of SARS-CoV-2 by FDA under an Emergency Use Authorization (EUA). This EUA will remain in effect (meaning this test can be used) for the duration of the COVID-19 declaration under Section 564(b)(1) of the Act, 21 U.S.C. section 360bbb-3(b)(1), unless the authorization is terminated or revoked.  Performed at [facility], [address] [place], [place] [address]          Radiology Studies: ECHOCARDIOGRAM COMPLETE Result Date: 02/11/2024    ECHOCARDIOGRAM REPORT   Patient Name:   Peter Becker Date of Exam: 02/11/2024 Medical Rec #:  [id number]     Height:  68.0 in Accession #:    [id number]    Weight:       115.0 lb Date of Birth:  [birth year]     BSA:          1.615 m Patient Age:    89 years      BP:           133/82 mmHg Patient Gender: M             HR:           84 bpm. Exam Location:  [facility] Procedure: 2D Echo, Cardiac Doppler, Color Doppler and Strain Analysis (Both            Spectral and Color Flow Doppler were utilized during procedure). Indications:     CHF  History:         Patient has prior history of Echocardiogram examinations, most                  recent 02/09/2023. CHF, Angina, CAD and Previous Myocardial                  Infarction, Prior CABG, PAD and COPD, Signs/Symptoms:Shortness                  of Breath; Risk Factors:Hypertension, Diabetes and                  Dyslipidemia. CKD.  Sonographer:     Mikki Harbor Referring Phys:  [id number] Lorretta Harp Diagnosing Phys: Julien Nordmann MD  Sonographer Comments: Image acquisition challenging due to COPD. Global longitudinal strain was attempted. IMPRESSIONS  1. Left ventricular ejection fraction, by estimation, is 25 to 30%. Left ventricular ejection fraction by PLAX is 27 %. The left ventricle has severely decreased function. The left ventricle  demonstrates global hypokinesis, akinesis of the distal anteroseptal and apical regions. There is moderate left ventricular hypertrophy. Left ventricular diastolic parameters are consistent with Grade I diastolic dysfunction (impaired relaxation). The average left ventricular global longitudinal strain is -3.8 %. The global longitudinal strain is abnormal.  2. Right ventricular systolic function is moderately reduced. The right ventricular size is normal. Tricuspid regurgitation signal is inadequate for assessing PA pressure.  3. The mitral valve is normal in structure. Mild to moderate mitral valve regurgitation. No evidence of mitral stenosis.  4. The aortic valve is tricuspid. There is mild calcification of the aortic valve. Aortic valve regurgitation is not visualized. Aortic valve sclerosis/calcification is present, without any evidence of aortic stenosis. Aortic valve mean gradient measures 4.0 mmHg.  5. The inferior vena cava is normal in size with <50% respiratory variability, suggesting right atrial pressure of 8 mmHg. FINDINGS  Left Ventricle: Left ventricular ejection fraction, by estimation, is 25 to 30%. Left ventricular ejection fraction by PLAX is 27 %. The left ventricle has severely decreased function. The left ventricle demonstrates global hypokinesis. The average left  ventricular global longitudinal strain is -3.8 %. Strain was performed and the global longitudinal strain is abnormal. The left ventricular internal cavity size was normal in size. There is moderate left ventricular hypertrophy. Left ventricular diastolic parameters are consistent with Grade I diastolic dysfunction (impaired relaxation). Right Ventricle: The right ventricular size is normal. No increase in right ventricular wall thickness. Right ventricular systolic function is moderately reduced. Tricuspid regurgitation signal is inadequate for assessing PA pressure. Left Atrium: Left atrial size was normal in size. Right Atrium:  Right atrial size was normal in size. Pericardium: There is no  evidence of pericardial effusion. Mitral Valve: The mitral valve is normal in structure. Mild to moderate mitral valve regurgitation. No evidence of mitral valve stenosis. MV peak gradient, 2.6 mmHg. The mean mitral valve gradient is 1.0 mmHg. Tricuspid Valve: The tricuspid valve is normal in structure. Tricuspid valve regurgitation is mild . No evidence of tricuspid stenosis. Aortic Valve: The aortic valve is tricuspid. There is mild calcification of the aortic valve. Aortic valve regurgitation is not visualized. Aortic valve sclerosis/calcification is present, without any evidence of aortic stenosis. Aortic valve mean gradient measures 4.0 mmHg. Aortic valve peak gradient measures 6.2 mmHg. Aortic valve area, by VTI measures 2.12 cm. Pulmonic Valve: The pulmonic valve was normal in structure. Pulmonic valve regurgitation is mild. No evidence of pulmonic stenosis. Aorta: The aortic root is normal in size and structure. Venous: The inferior vena cava is normal in size with less than 50% respiratory variability, suggesting right atrial pressure of 8 mmHg. IAS/Shunts: No atrial level shunt detected by color flow Doppler. Additional Comments: 3D was performed not requiring image post processing on an independent workstation and was indeterminate. There is a small pleural effusion in the left lateral region.  LEFT VENTRICLE PLAX 2D LV EF:         Left            Diastology                ventricular     LV e' medial:    3.70 cm/s                ejection        LV E/e' medial:  20.2                fraction by     LV e' lateral:   4.90 cm/s                PLAX is 27      LV E/e' lateral: 15.3                %. LVIDd:         4.10 cm         2D Longitudinal LVIDs:         3.60 cm         Strain LV PW:         1.60 cm         2D Strain GLS   -3.8 % LV IVS:        1.70 cm         Avg: LVOT diam:     2.10 cm LV SV:         49 LV SV Index:   30 LVOT Area:     3.46  cm  LV Volumes (MOD) LV vol d, MOD    78.6 ml A2C: LV vol d, MOD    49.6 ml A4C: LV vol s, MOD    50.2 ml A2C: LV vol s, MOD    36.3 ml A4C: LV SV MOD A2C:   28.4 ml LV SV MOD A4C:   49.6 ml LV SV MOD BP:    21.1 ml RIGHT VENTRICLE RV Basal diam:  4.15 cm RV Mid diam:    2.80 cm RV S prime:     6.53 cm/s LEFT ATRIUM              Index        RIGHT ATRIUM  Index LA diam:        3.70 cm  2.29 cm/m   RA Area:     21.90 cm LA Vol (A2C):   117.0 ml 72.43 ml/m  RA Volume:   77.10 ml  47.73 ml/m LA Vol (A4C):   42.9 ml  26.56 ml/m LA Biplane Vol: 70.9 ml  43.89 ml/m  AORTIC VALVE                    PULMONIC VALVE AV Area (Vmax):    2.23 cm     PV Vmax:       0.68 m/s AV Area (Vmean):   1.83 cm     PV Peak grad:  1.8 mmHg AV Area (VTI):     2.12 cm AV Vmax:           125.00 cm/s AV Vmean:          94.000 cm/s AV VTI:            0.230 m AV Peak Grad:      6.2 mmHg AV Mean Grad:      4.0 mmHg LVOT Vmax:         80.40 cm/s LVOT Vmean:        49.800 cm/s LVOT VTI:          0.141 m LVOT/AV VTI ratio: 0.61  AORTA Ao Root diam: 3.30 cm MITRAL VALVE MV Area (PHT): 4.80 cm    SHUNTS MV Area VTI:   2.19 cm    Systemic VTI:  0.14 m MV Peak grad:  2.6 mmHg    Systemic Diam: 2.10 cm MV Mean grad:  1.0 mmHg MV Vmax:       0.80 m/s MV Vmean:      57.7 cm/s MV Decel Time: 158 msec MV E velocity: 74.80 cm/s MV A velocity: 74.20 cm/s MV E/A ratio:  1.01 Julien Nordmann MD Electronically signed by Julien Nordmann MD Signature Date/Time: 02/11/2024/4:45:57 PM    Final    DG Chest Port 1 View Result Date: 02/11/2024 CLINICAL DATA:  [id number] Pleural effusion on left [id number] Status post thoracentesis [id number] Pleural effusion on right [id number] EXAM: PORTABLE CHEST 1 VIEW COMPARISON:  02/10/2024. FINDINGS: There are diffuse alveolar and interstitial opacities throughout the left lung with lower lobe gradient, which appears significantly increased since the prior study from yesterday. Left lower hemithorax opacity  appears essentially unchanged. There is small left pleural effusion, grossly similar to the prior study. Significant interval decrease in the right pleural effusion, status post thoracentesis. No pneumothorax. Evaluation of cardiomediastinal silhouette is nondiagnostic due to left lower hemithorax opacification. No acute osseous abnormalities. The soft tissues are within normal limits. IMPRESSION: 1. Significant interval decrease in the right pleural effusion, status post thoracentesis. No pneumothorax. 2. Significant interval increase in the diffuse alveolar and interstitial opacities throughout the left lung with lower lobe gradient. Findings are nonspecific and differential diagnosis includes combination of asymmetric pulmonary edema with or without aspiration pneumonia. Pre-existing left lower hemithorax opacity appears essentially unchanged. Electronically Signed   By: Jules Schick M.D.   On: 02/11/2024 14:44   US THORACENTESIS ASP PLEURAL SPACE W/IMG GUIDE Result Date: 02/11/2024 INDICATION: 89 year old male with currently in the ED with right pleural effusion on BiPAP for diagnostic and therapeutic thoracentesis. EXAM: ULTRASOUND GUIDED RIGHT THORACENTESIS MEDICATIONS: 10 mL 1% lidocaine COMPLICATIONS: None immediate. PROCEDURE: An ultrasound guided thoracentesis was thoroughly discussed with the patient and questions answered. The benefits,  risks, alternatives and complications were also discussed. The patient understands and wishes to proceed with the procedure. Written consent was obtained. Ultrasound was performed to localize and mark an adequate pocket of fluid in the right chest. The area was then prepped and draped in the normal sterile fashion. 1% Lidocaine was used for local anesthesia. Under ultrasound guidance a 8 Fr Safe-T-Centesis catheter was introduced. Thoracentesis was performed. The catheter was removed and a dressing applied. FINDINGS: A total of approximately 900 mL of clear, dark  yellow fluid was removed. Samples were sent to the laboratory as requested by the clinical team. IMPRESSION: Successful ultrasound guided right thoracentesis yielding 900 mL of pleural fluid. Performed By Theresa Mulligan, PA-C Electronically Signed   By: Malachy Moan M.D.   On: 02/11/2024 14:25   DG Chest Portable 1 View Result Date: 02/10/2024 CLINICAL DATA:  sob EXAM: PORTABLE CHEST 1 VIEW COMPARISON:  Chest x-ray 02/03/2024, CT chest 11/23/2017 FINDINGS: The heart and mediastinal contours are unchanged. Atherosclerotic plaque. Interval development of patchy airspace opacity of the left mid lung zone. Pulmonary edema. Interval increase in size of a small to moderate left pleural effusion. Grossly stable at least small right pleural effusion. Calcified subcentimeter nodules again noted overlying the left upper lobe. No pneumothorax. No acute osseous abnormality. IMPRESSION: 1. Pulmonary edema with likely superimposed infection of the left mid lung zone. 2. Interval increase in size of a small to moderate left pleural effusion. 3. Grossly stable at least small right pleural effusion. Electronically Signed   By: Tish Frederickson M.D.   On: 02/10/2024 20:35        Scheduled Meds:  aspirin EC  81 mg Oral Daily   atorvastatin  40 mg Oral Daily   carvedilol  3.125 mg Oral BID WC   enoxaparin (LOVENOX) injection  30 mg Subcutaneous Q24H   feeding supplement  237 mL Oral BID BM   furosemide  60 mg Intravenous BID   tamsulosin  0.4 mg Oral Daily   Continuous Infusions:  dextrose 30 mL/hr at 02/12/24 1258     LOS: 2 days    Silvano Bilis, MD [facility]   If 7PM-7AM, please contact night-coverage [email or web address] Password [facility] 02/12/2024, 1:04 PM

## 2024-02-12 NOTE — Progress Notes (Signed)
 Pt transported from ED to Room on NRB with RN without incident. This RT followed with BiPAP and HHFNC. Both connected to wall O2. Pt placed back on HHFNC.

## 2024-02-12 NOTE — ED Notes (Signed)
 Pt repositioned for comfort

## 2024-02-12 NOTE — ED Notes (Addendum)
 Pt found to have hypoglycemia, given food/drink per diet order, remains hypoglycemic, NP notified and medicated per hypoglycemia protocol and drip initiated. Pt remained A+Ox4.

## 2024-02-12 NOTE — Progress Notes (Signed)
       CROSS COVER NOTE  NAME: Peter Becker MRN: [id number] DOB : [birth year] ATTENDING PHYSICIAN: Kathrynn Running, MD    Date of Service   02/12/2024   HPI/Events of Note   Hypoglycemia 42  Interventions   Assessment/Plan: Third hypoglycemic event 25 gm D 50 x1 - recheck cbg every 30 min x 2  Start D10 at 30 ml/h Discontinued SSI        Donnie Mesa NP [facility] Cross Cover 7pm-7am - check amion for availability Pager [phone]

## 2024-02-13 DIAGNOSIS — Z9889 Other specified postprocedural states: Secondary | ICD-10-CM | POA: Diagnosis not present

## 2024-02-13 DIAGNOSIS — I5023 Acute on chronic systolic (congestive) heart failure: Secondary | ICD-10-CM | POA: Diagnosis not present

## 2024-02-13 DIAGNOSIS — Z515 Encounter for palliative care: Secondary | ICD-10-CM | POA: Diagnosis not present

## 2024-02-13 DIAGNOSIS — J90 Pleural effusion, not elsewhere classified: Secondary | ICD-10-CM

## 2024-02-13 LAB — CBC
HCT: 37.7 % — ABNORMAL LOW (ref 39.0–52.0)
Hemoglobin: 12 g/dL — ABNORMAL LOW (ref 13.0–17.0)
MCH: 33.7 pg (ref 26.0–34.0)
MCHC: 31.8 g/dL (ref 30.0–36.0)
MCV: 105.9 fL — ABNORMAL HIGH (ref 80.0–100.0)
Platelets: 157 10*3/uL (ref 150–400)
RBC: 3.56 MIL/uL — ABNORMAL LOW (ref 4.22–5.81)
RDW: 12.9 % (ref 11.5–15.5)
WBC: 7.2 10*3/uL (ref 4.0–10.5)
nRBC: 0 % (ref 0.0–0.2)

## 2024-02-13 LAB — BASIC METABOLIC PANEL
Anion gap: 11 (ref 5–15)
BUN: 53 mg/dL — ABNORMAL HIGH (ref 8–23)
CO2: 29 mmol/L (ref 22–32)
Calcium: 8.3 mg/dL — ABNORMAL LOW (ref 8.9–10.3)
Chloride: 98 mmol/L (ref 98–111)
Creatinine, Ser: 1.74 mg/dL — ABNORMAL HIGH (ref 0.61–1.24)
GFR, Estimated: 36 mL/min — ABNORMAL LOW (ref >60)
Glucose, Bld: 135 mg/dL — ABNORMAL HIGH (ref 70–99)
Potassium: 4.2 mmol/L (ref 3.5–5.1)
Sodium: 138 mmol/L (ref 135–145)

## 2024-02-13 LAB — GLUCOSE, CAPILLARY
Glucose-Capillary: 137 mg/dL — ABNORMAL HIGH (ref 70–99)
Glucose-Capillary: 131 mg/dL — ABNORMAL HIGH (ref 70–99)
Glucose-Capillary: 130 mg/dL — ABNORMAL HIGH (ref 70–99)
Glucose-Capillary: 198 mg/dL — ABNORMAL HIGH (ref 70–99)
Glucose-Capillary: 285 mg/dL — ABNORMAL HIGH (ref 70–99)
Glucose-Capillary: 298 mg/dL — ABNORMAL HIGH (ref 70–99)

## 2024-02-13 LAB — KAPPA/LAMBDA LIGHT CHAINS
Kappa free light chain: 28 mg/L — ABNORMAL HIGH (ref 3.3–19.4)
Kappa, lambda light chain ratio: 1.49 (ref 0.26–1.65)
Lambda free light chains: 18.8 mg/L (ref 5.7–26.3)

## 2024-02-13 MED ORDER — ADULT MULTIVITAMIN W/MINERALS CH
1.0000 | ORAL_TABLET | Freq: Every day | ORAL | Status: DC
Start: 2024-02-13 — End: 2024-02-14
  Administered 2024-02-13 – 2024-02-14 (×2): 1 via ORAL
  Filled 2024-02-13 (×2): qty 1

## 2024-02-13 NOTE — Plan of Care (Signed)
  Problem: Fluid Volume: Goal: Ability to maintain a balanced intake and output will improve Outcome: Progressing   Problem: Health Behavior/Discharge Planning: Goal: Ability to manage health-related needs will improve Outcome: Progressing   Problem: Metabolic: Goal: Ability to maintain appropriate glucose levels will improve Outcome: Progressing   Problem: Nutritional: Goal: Progress toward achieving an optimal weight will improve Outcome: Progressing   Problem: Skin Integrity: Goal: Risk for impaired skin integrity will decrease Outcome: Progressing   Problem: Health Behavior/Discharge Planning: Goal: Ability to manage health-related needs will improve Outcome: Progressing   Problem: Clinical Measurements: Goal: Will remain free from infection Outcome: Progressing Goal: Cardiovascular complication will be avoided Outcome: Progressing   Problem: Coping: Goal: Level of anxiety will decrease Outcome: Progressing   Problem: Safety: Goal: Ability to remain free from injury will improve Outcome: Progressing

## 2024-02-13 NOTE — Progress Notes (Addendum)
 SLP Cancellation Note  Patient Details Name: Peter Becker MRN: [id number] DOB: 12-12-1928   Cancelled treatment:       Reason Eval/Treat Not Completed: Medical issues which prohibited therapy  Pt with known silent aspiration of thin liquids as confirmed on Modified Barium Swallow Study 02/13/2023:  Pharyngeal phase remarkable for swallow initiation at the level of the valleculae/pyriform sinus, before the swallow penetration of both nectar-thick and thin liquids via tsp, cup, and straw sip with subsequent silent aspiration of thin liquids via straw sip, trace-moderate pharyngeal stasis which increased with visocity. Compensations of cued/spontaneous secondary swallow, cued throat clearing, and use of liquid wash were inconsistently effective. Cued cough was not effective. Above mentioned deficits secondary to observed or inferred: lingual weakness/incoordination, reduced base of tongue retraction, mistimed laryngeal vestibule closure, reduced amplitude/duration of UES opening, and reduced laryngeal and pharyngeal sensation.   Given silent aspiration, a bedside swallow evaluation would not be effective in determining safest diet. Recommend a repeat Modified Barium Swallow Study. Per pt's nurse, pt currently on 50L HFNC with inability to wean down. Given fragility of current respiratory status, would recommend NPO in hopes that pt might be able to decrease his O2 requirement and be able to participate in a Modified Barium Swallow Study in the coming days. This recommendation was communicated via secure chat to patient's medical team, including palliative care NP. ST to continue to follow and monitor medical readiness for objective MBSS; will adjust/update goals and POC as appropriate based on medical status and goals of care.    [name] B. Dreama Saa, M.S., CCC-SLP, CBIS Speech-Language Pathologist Certified Brain Injury Specialist [facility] [phone] Ascom [phone] Fax [phone]   [name] 02/13/2024, 11:39 AM

## 2024-02-13 NOTE — Progress Notes (Signed)
 Advanced Heart Failure Rounding Note   Subjective:    Remains on HHFNC. Denies CP or SOB  Has diuresed modestly. Scr up slightly.   Sitting up in bed eating breakfast and coughing as he eats.   Remains on D5 gtt for hypoglycemia  Asking if he can go out a smoke a cigarette. "I'm 89 I should be able to"    Objective:   Weight Range:  Vital Signs:   Temp:  [97.3 F (36.3 C)-98.7 F (37.1 C)] 98.5 F (36.9 C) (03/14 1154) Pulse Rate:  [65-85] 82 (03/14 1154) Resp:  [18-27] 18 (03/14 0416) BP: (89-107)/(51-62) 107/59 (03/14 1152) SpO2:  [91 %-100 %] 100 % (03/14 1154) FiO2 (%):  [80 %-88 %] 80 % (03/14 0730) Weight:  [57.1 kg] 57.1 kg (03/14 0514)    Weight change: Filed Weights   02/13/24 0514  Weight: 57.1 kg    Intake/Output:   Intake/Output Summary (Last 24 hours) at 02/13/2024 1320 Last data filed at 02/13/2024 0600 Gross per 24 hour  Intake 705.63 ml  Output 400 ml  Net 305.63 ml     Physical Exam: General: Elderly. Sitting up in bed. + cough.  HEENT: normal wearing HHFNC Neck: supple. JVP . Carotids 2+ bilat; no bruits. No lymphadenopathy or thryomegaly appreciated. Cor: PMI nondisplaced. Regular rate & rhythm. No rubs, gallops or murmurs. Lungs: decreased throughout Abdomen: soft, nontender, nondistended. No hepatosplenomegaly. No bruits or masses. Good bowel sounds. Extremities: no cyanosis, clubbing, rash, edema Neuro: alert & orientedx3, cranial nerves grossly intact. moves all 4 extremities w/o difficulty. Affect pleasant  Telemetry:  Sinus 80s Personally reviewed  Labs: Basic Metabolic Panel: Recent Labs  Lab 02/08/24 1923 02/10/24 1854 02/11/24 0458 02/11/24 0855 02/12/24 0430 02/13/24 0504  NA 142 139 142  --  142 138  K 4.7 5.1 5.6* 4.3 4.5 4.2  CL 104 102 108  --  106 98  CO2 30 27 27   --  26 29  GLUCOSE 276* 345* 71  --  50* 135*  BUN 30* 38* 40*  --  43* 53*  CREATININE 1.21 1.50* 1.52*  --  1.65* 1.74*  CALCIUM 8.9 8.8*  8.3*  --  8.1* 8.3*  MG  --   --  2.3  --   --   --     Liver Function Tests: Recent Labs  Lab 02/10/24 1854  AST 84*  ALT 65*  ALKPHOS 89  BILITOT 0.8  PROT 5.9*  ALBUMIN 3.7   No results for input(s): "LIPASE", "AMYLASE" in the last 168 hours. No results for input(s): "AMMONIA" in the last 168 hours.  CBC: Recent Labs  Lab 02/08/24 1923 02/10/24 1854 02/11/24 0458 02/12/24 0430 02/13/24 0504  WBC 6.2 6.2 7.5 8.0 7.2  NEUTROABS  --  4.8  --   --   --   HGB 13.0 13.5 13.0 12.9* 12.0*  HCT 41.1 42.3 40.5 40.7 37.7*  MCV 105.4* 103.4* 104.4* 105.2* 105.9*  PLT 180 184 188 164 157    Cardiac Enzymes: No results for input(s): "CKTOTAL", "CKMB", "CKMBINDEX", "TROPONINI" in the last 168 hours.  BNP: BNP (last 3 results) Recent Labs    02/10/24 1854  BNP 1,652.9*    ProBNP (last 3 results) No results for input(s): "PROBNP" in the last 8760 hours.    Other results:  Imaging: NM Pulmonary Perfusion Result Date: 02/12/2024 CLINICAL DATA:  Pulmonary embolism (PE) suspected, low to intermediate prob, positive D-dimer hypoxia, elevated dimer EXAM: NUCLEAR MEDICINE  PERFUSION LUNG SCAN TECHNIQUE: Perfusion images were obtained in multiple projections after intravenous injection of radiopharmaceutical. Ventilation scans intentionally deferred if perfusion scan and chest x-ray adequate for interpretation during COVID 19 epidemic. RADIOPHARMACEUTICALS:  4.34 mCi Tc-65m MAA IV COMPARISON:  Chest x-ray 02/11/2024 FINDINGS: Heterogeneous distribution of radiotracer within the bilateral lung fields. Matched perfusion defects of the left mid to lower lung field. No segmental perfusion defect within the right lung. IMPRESSION: Low probability for pulmonary embolism. Electronically Signed   By: Duanne Guess D.O.   On: 02/12/2024 15:57   ECHOCARDIOGRAM COMPLETE Result Date: 02/11/2024    ECHOCARDIOGRAM REPORT   Patient Name:   Peter Becker Date of Exam: 02/11/2024 Medical Rec #:   [id number]     Height:       68.0 in Accession #:    [id number]    Weight:       115.0 lb Date of Birth:  01/19/1929     BSA:          1.615 m Patient Age:    89 years      BP:           133/82 mmHg Patient Gender: M             HR:           84 bpm. Exam Location:  [facility] Procedure: 2D Echo, Cardiac Doppler, Color Doppler and Strain Analysis (Both            Spectral and Color Flow Doppler were utilized during procedure). Indications:     CHF  History:         Patient has prior history of Echocardiogram examinations, most                  recent 02/09/2023. CHF, Angina, CAD and Previous Myocardial                  Infarction, Prior CABG, PAD and COPD, Signs/Symptoms:Shortness                  of Breath; Risk Factors:Hypertension, Diabetes and                  Dyslipidemia. CKD.  Sonographer:     Mikki Harbor Referring Phys:  [id number] Lorretta Harp Diagnosing Phys: Julien Nordmann MD  Sonographer Comments: Image acquisition challenging due to COPD. Global longitudinal strain was attempted. IMPRESSIONS  1. Left ventricular ejection fraction, by estimation, is 25 to 30%. Left ventricular ejection fraction by PLAX is 27 %. The left ventricle has severely decreased function. The left ventricle demonstrates global hypokinesis, akinesis of the distal anteroseptal and apical regions. There is moderate left ventricular hypertrophy. Left ventricular diastolic parameters are consistent with Grade I diastolic dysfunction (impaired relaxation). The average left ventricular global longitudinal strain is -3.8 %. The global longitudinal strain is abnormal.  2. Right ventricular systolic function is moderately reduced. The right ventricular size is normal. Tricuspid regurgitation signal is inadequate for assessing PA pressure.  3. The mitral valve is normal in structure. Mild to moderate mitral valve regurgitation. No evidence of mitral stenosis.  4. The aortic valve is tricuspid. There is mild calcification of the aortic valve. Aortic  valve regurgitation is not visualized. Aortic valve sclerosis/calcification is present, without any evidence of aortic stenosis. Aortic valve mean gradient measures 4.0 mmHg.  5. The inferior vena cava is normal in size with <50% respiratory variability, suggesting right atrial pressure of 8 mmHg. FINDINGS  Left Ventricle:  Left ventricular ejection fraction, by estimation, is 25 to 30%. Left ventricular ejection fraction by PLAX is 27 %. The left ventricle has severely decreased function. The left ventricle demonstrates global hypokinesis. The average left  ventricular global longitudinal strain is -3.8 %. Strain was performed and the global longitudinal strain is abnormal. The left ventricular internal cavity size was normal in size. There is moderate left ventricular hypertrophy. Left ventricular diastolic parameters are consistent with Grade I diastolic dysfunction (impaired relaxation). Right Ventricle: The right ventricular size is normal. No increase in right ventricular wall thickness. Right ventricular systolic function is moderately reduced. Tricuspid regurgitation signal is inadequate for assessing PA pressure. Left Atrium: Left atrial size was normal in size. Right Atrium: Right atrial size was normal in size. Pericardium: There is no evidence of pericardial effusion. Mitral Valve: The mitral valve is normal in structure. Mild to moderate mitral valve regurgitation. No evidence of mitral valve stenosis. MV peak gradient, 2.6 mmHg. The mean mitral valve gradient is 1.0 mmHg. Tricuspid Valve: The tricuspid valve is normal in structure. Tricuspid valve regurgitation is mild . No evidence of tricuspid stenosis. Aortic Valve: The aortic valve is tricuspid. There is mild calcification of the aortic valve. Aortic valve regurgitation is not visualized. Aortic valve sclerosis/calcification is present, without any evidence of aortic stenosis. Aortic valve mean gradient measures 4.0 mmHg. Aortic valve peak gradient  measures 6.2 mmHg. Aortic valve area, by VTI measures 2.12 cm. Pulmonic Valve: The pulmonic valve was normal in structure. Pulmonic valve regurgitation is mild. No evidence of pulmonic stenosis. Aorta: The aortic root is normal in size and structure. Venous: The inferior vena cava is normal in size with less than 50% respiratory variability, suggesting right atrial pressure of 8 mmHg. IAS/Shunts: No atrial level shunt detected by color flow Doppler. Additional Comments: 3D was performed not requiring image post processing on an independent workstation and was indeterminate. There is a small pleural effusion in the left lateral region.  LEFT VENTRICLE PLAX 2D LV EF:         Left            Diastology                ventricular     LV e' medial:    3.70 cm/s                ejection        LV E/e' medial:  20.2                fraction by     LV e' lateral:   4.90 cm/s                PLAX is 27      LV E/e' lateral: 15.3                %. LVIDd:         4.10 cm         2D Longitudinal LVIDs:         3.60 cm         Strain LV PW:         1.60 cm         2D Strain GLS   -3.8 % LV IVS:        1.70 cm         Avg: LVOT diam:     2.10 cm LV SV:         49 LV  SV Index:   30 LVOT Area:     3.46 cm  LV Volumes (MOD) LV vol d, MOD    78.6 ml A2C: LV vol d, MOD    49.6 ml A4C: LV vol s, MOD    50.2 ml A2C: LV vol s, MOD    36.3 ml A4C: LV SV MOD A2C:   28.4 ml LV SV MOD A4C:   49.6 ml LV SV MOD BP:    21.1 ml RIGHT VENTRICLE RV Basal diam:  4.15 cm RV Mid diam:    2.80 cm RV S prime:     6.53 cm/s LEFT ATRIUM              Index        RIGHT ATRIUM           Index LA diam:        3.70 cm  2.29 cm/m   RA Area:     21.90 cm LA Vol (A2C):   117.0 ml 72.43 ml/m  RA Volume:   77.10 ml  47.73 ml/m LA Vol (A4C):   42.9 ml  26.56 ml/m LA Biplane Vol: 70.9 ml  43.89 ml/m  AORTIC VALVE                    PULMONIC VALVE AV Area (Vmax):    2.23 cm     PV Vmax:       0.68 m/s AV Area (Vmean):   1.83 cm     PV Peak grad:  1.8 mmHg AV  Area (VTI):     2.12 cm AV Vmax:           125.00 cm/s AV Vmean:          94.000 cm/s AV VTI:            0.230 m AV Peak Grad:      6.2 mmHg AV Mean Grad:      4.0 mmHg LVOT Vmax:         80.40 cm/s LVOT Vmean:        49.800 cm/s LVOT VTI:          0.141 m LVOT/AV VTI ratio: 0.61  AORTA Ao Root diam: 3.30 cm MITRAL VALVE MV Area (PHT): 4.80 cm    SHUNTS MV Area VTI:   2.19 cm    Systemic VTI:  0.14 m MV Peak grad:  2.6 mmHg    Systemic Diam: 2.10 cm MV Mean grad:  1.0 mmHg MV Vmax:       0.80 m/s MV Vmean:      57.7 cm/s MV Decel Time: 158 msec MV E velocity: 74.80 cm/s MV A velocity: 74.20 cm/s MV E/A ratio:  1.01 Julien Nordmann MD Electronically signed by Julien Nordmann MD Signature Date/Time: 02/11/2024/4:45:57 PM    Final      Medications:     Scheduled Medications:  aspirin EC  81 mg Oral Daily   atorvastatin  40 mg Oral Daily   enoxaparin (LOVENOX) injection  30 mg Subcutaneous Q24H   feeding supplement  237 mL Oral BID BM   multivitamin with minerals  1 tablet Oral Daily   tamsulosin  0.4 mg Oral Daily    Infusions:  dextrose 30 mL/hr at 02/13/24 0551    PRN Medications: acetaminophen, albuterol, dextromethorphan-guaiFENesin, hydrALAZINE, ondansetron (ZOFRAN) IV   Assessment/Plan:   1: Acute chronic heart failure with reduced ejection fraction- - echo 02/09/23: EF 35-40%, moderate LVH, mild/ moderate MR,  moderate TR - echo 01/1224 EF 25-30% with biventricular hypertrophy RV mildly reduced AV/M thickened Personally reviewed  - admitted with volume overload and respiratory distress - Respiratory status improved with IV lasix and right thoracentesis - Echo highly suggestive of cardiac amyloid -> myeloma panel pending - Scr now bumping. Doubt we can diurese him much further. Will stop IV lasix - Continue to hold carvedilol with hypoglycemia and acute decompensation - would not restart  - Not candidate for SGLT2i with severe hypoglycemia - IF SCr stabilizies restart losartan 12.5  as BP tolerates - Start lasix 40 po tomorrow - If myeloma panel negative can consider outpatient w/u for TTR amyloid. Though benefit of tafamadis at this stage likely marginal given late stage and comorbidities   2. Pleural effusions, recurrent  - s/p right thora - will follow - can consider Pleurex tube as needed If recurs    3. Acute hypoxic respiratory failure - due to #1&#2 - improving - continue diuresis   4. CAD - CABG X3 in 2007 - no evidence ACS - no angina - not candidate for further invasive testing at this point   5. DM2 poorly controlled - HgBa1c 10.1% -  now with hypoglycemia - management per primary team - hold b-blocker   6. AKI - SCR 1.2 -> 1.6 -> 1.7 - likely cardiorenal +/- amyloid.  - hold lasix  7. Cough - high suspicion for aspiration  - SLP following   8. GOC - he has been pretty functional at home but worse lately. If not improving may benefit from Palliative Care involvement   Overall little left to ffer at this point given advanced age, severe HF and comorbidities. Stop lasix today. Await amyloid /myeloma work-up which can be followed up as outpatient.  Continue GOC discussions.  We will see again on Monday if still in house.     Length of Stay: 3   Pavel Gadd MD 02/13/2024, 1:20 PM  Advanced Heart Failure Team Pager [phone] (M-F; 7a - 4p)  Please contact [facility] for night-coverage after hours (4p -7a ) and weekends on [email or web address]

## 2024-02-13 NOTE — Progress Notes (Signed)
 Transition of Care [facility]) - Inpatient Brief Assessment   Patient Details  Name: Peter Becker MRN: [id number] Date of Birth: 1929/05/29  Transition [facility] CM/SW Contact:    Truddie Hidden, RN Phone Number: 02/13/2024, 3:05 PM   Clinical Narrative: TOC continuing to follow patient's progress throughout discharge planning.   Transition of Care Asessment: Insurance and Status: Insurance coverage has been reviewed Patient has primary care physician: Yes   Prior level of function:: Independent Prior/Current Home Services: No current home services Social Drivers of Health Review: SDOH reviewed no interventions necessary Readmission risk has been reviewed: Yes Transition of care needs: no transition of care needs at this time

## 2024-02-13 NOTE — Care Management Important Message (Signed)
 Important Message  Patient Details  Name: Peter Becker MRN: [id number] Date of Birth: August 27, 1929   Important Message Given:  Yes - Medicare IM     Akila Batta W, CMA 02/13/2024, 10:59 AM

## 2024-02-13 NOTE — Progress Notes (Signed)
 Heart Failure Navigator Progress Note  Assessed for Heart & Vascular TOC clinic readiness.  Patient does not meet criteria due to current Advanced Heart Failure Team patient of Peter Becker. [name], MD.   Navigator will sign off at this time.  Roxy Horseman, RN, BSN [facility] Heart Failure Navigator Secure Chat Only

## 2024-02-13 NOTE — Inpatient Diabetes Management (Signed)
 Inpatient Diabetes Program Recommendations  AACE/ADA: New Consensus Statement on Inpatient Glycemic Control (2015)  Target Ranges:  Prepandial:   less than 140 mg/dL      Peak postprandial:   less than 180 mg/dL (1-2 hours)      Critically ill patients:  140 - 180 mg/dL   Lab Results  Component Value Date   GLUCAP 130 (H) 02/13/2024   HGBA1C 10.1 (H) 02/10/2024    Latest Reference Range & Units 02/12/24 04:34 02/12/24 05:14 02/12/24 06:34 02/12/24 07:52 02/12/24 12:52 02/12/24 21:02 02/13/24 01:16 02/13/24 04:20 02/13/24 08:25  Glucose-Capillary 70 - 99 mg/dL 47 (L) 161 (H) 096 (H) 178 (H) 146 (H) 153 (H) 137 (H) 131 (H) 130 (H)  (L): Data is abnormally low (H): Data is abnormally high  Diabetes history: DM2 Outpatient Diabetes medications: Glucotrol 10 daily, Actose 15 daily, Farxiga 10 dail  Current orders for Inpatient glycemic control: CBGs only  Inpatient Diabetes Program Recommendations:   Noted patient's CBGs now in range without any insulin or DM medications. Patient will need reevaluation prior to D/C for DM medications.  Thank you, Billy Fischer. Kabria Hetzer, RN, MSN, CDCES  Diabetes Coordinator Inpatient Glycemic Control Team Team Pager [phone] (8am-5pm) 02/13/2024 10:39 AM

## 2024-02-13 NOTE — Progress Notes (Signed)
 PROGRESS NOTE    Peter Becker  OZH:[id number] DOB: Feb 25, 1929 DOA: 02/10/2024 PCP: Danella Penton, MD  Outpatient Specialists: cardiology    Brief Narrative:   From admission h and p  Peter Becker is a 89 y.o. male with medical history significant of sCHF with EF 35-40%, HTN, HLD, DM, CAD, CABG, PVD, stroke, anxiety, CKD-3a, who presents with SOB.   Pt states that he has shortness of breath in the past several days, which has been progressively worsening.  Patient has mild dry cough, no chest pain, fever or chills.  Patient was found to have oxygen desaturation to 54% on room air, with acute respiratory distress, difficulty speaking in full sentence. Initially started on high flow nasal cannula oxygen, and then titrated down to 5 L oxygen with 94% of saturation.  Patient does not have nausea, vomiting, diarrhea or abdominal pain.  No symptoms of UTI.  No fever or chills.  Patient states that his blood sugar has been elevated in the past several weeks. He has tried to make an appointment with Endocrinologist but they "have to review his chart" before scheduling an appointment  Assessment & Plan:   Principal Problem:   Acute on chronic systolic CHF (congestive heart failure) (HCC) Active Problems:   Acute respiratory failure with hypoxia (HCC)   CAD (coronary artery disease)   Myocardial injury   Essential hypertension   Hyperlipidemia   Ischemic stroke (HCC)   Type II diabetes mellitus with renal manifestations (HCC)   Chronic kidney disease, stage 3a (HCC)   BPH (benign prostatic hyperplasia)   Abnormal LFTs   Protein-calorie malnutrition, severe (HCC)   PAD (peripheral artery disease) (HCC)   # Hyperglycemia # T2DM Despite what h and p says, patient reports (and initial ED provider report) says patient presented with concern for hyperglycemia. A1c is in the 10s. Here intermittent hypoglycemia, none last 24 - SSI sensitive   - glucose prn - holding home glipizide,  pioglitazone  # Hypoxia Likely 2/2 decompensated chf. Relatively asymptomatic but requiring high flow to maintain his sats. No dyspnea, low procal. Trops are int he 200s but no chest pain, no overt ischemic changes on EKG. Does have pleural effusion as well that was drained. Dimer is elevated. Vq scan no PE - HF [place], wean as able  # biventricular HFrEF, decompensated Bnp 1600s, hypoxic, cxr with pulm edema, also mod left pleural effusion. EF 25-30 which is a decline from last check, also RV dysfunction. - coreg held given decompensated chf - advanced heart failure team consulted, defer therapeutics to them.   - strict I/os - palliative consult - prognosis is poor - lasix paused today by cardiology, presumably given up-trending bun and cr  # Pleural effusion Likely 2/2 chf, may be contributing to hypoxia. diagnostic/therapeutic thoracentesis performed on 3/12, out 900 ml. Consistent with transudate likely from decompensated chf.  # BPH - home flomax  # Dysphagia - SLP eval  # HTN Bp low normal - holding meds  # CAD # PAD Remote history cabg. Asymptomatic - cont home asa, statin  # ckd 3a Cr 1.7 bit above baseline of 1.2-1.3, likely some degree of cardiorenal vs diuretic effect - monitor    DVT prophylaxis: lovenox Code Status: dnr Family Communication: daughter updated telephonically 3/13  Level of care: Progressive Status is: Inpatient Remains inpatient appropriate because: severity of illness    Consultants:  Advanced heart failure team  Procedures: Thoracentesis 3/12  Antimicrobials:  none    Subjective:  Breathing stable, no pain or other complaints. Small amount of lunch ate today  Objective: Vitals:   02/13/24 0730 02/13/24 0805 02/13/24 1152 02/13/24 1154  BP:  (!) 106/55 (!) 107/59   Pulse:  85 81 82  Resp:      Temp:  98.7 F (37.1 C)  98.5 F (36.9 C)  TempSrc:  Oral    SpO2: 93% 99% 99% 100%  Weight:        Intake/Output Summary (Last  24 hours) at 02/13/2024 1310 Last data filed at 02/13/2024 0600 Gross per 24 hour  Intake 705.63 ml  Output 400 ml  Net 305.63 ml   Filed Weights   02/13/24 0514  Weight: 57.1 kg    Examination:  General exam: Appears calm and comfortable, chronically ill appearing Respiratory system: decreased breat sounds at bases, otherwise clear. Normal wob Cardiovascular system: S1 & S2 heard, RR, distant heart sounds Gastrointestinal system: Abdomen is nondistended, soft and nontender.   Central nervous system: Alert and oriented. No focal neurological deficits. Extremities: Symmetric 5 x 5 power. Trace LE edema Skin: No rashes, lesions or ulcers Psychiatry: Judgement and insight appear normal. Mood & affect appropriate.     Data Reviewed: I have personally reviewed following labs and imaging studies  CBC: Recent Labs  Lab 02/08/24 1923 02/10/24 1854 02/11/24 0458 02/12/24 0430 02/13/24 0504  WBC 6.2 6.2 7.5 8.0 7.2  NEUTROABS  --  4.8  --   --   --   HGB 13.0 13.5 13.0 12.9* 12.0*  HCT 41.1 42.3 40.5 40.7 37.7*  MCV 105.4* 103.4* 104.4* 105.2* 105.9*  PLT 180 184 188 164 157   Basic Metabolic Panel: Recent Labs  Lab 02/08/24 1923 02/10/24 1854 02/11/24 0458 02/11/24 0855 02/12/24 0430 02/13/24 0504  NA 142 139 142  --  142 138  K 4.7 5.1 5.6* 4.3 4.5 4.2  CL 104 102 108  --  106 98  CO2 30 27 27   --  26 29  GLUCOSE 276* 345* 71  --  50* 135*  BUN 30* 38* 40*  --  43* 53*  CREATININE 1.21 1.50* 1.52*  --  1.65* 1.74*  CALCIUM 8.9 8.8* 8.3*  --  8.1* 8.3*  MG  --   --  2.3  --   --   --    GFR: Estimated Creatinine Clearance: 21.4 mL/min (A) (by C-G formula based on SCr of 1.74 mg/dL (H)). Liver Function Tests: Recent Labs  Lab 02/10/24 1854  AST 84*  ALT 65*  ALKPHOS 89  BILITOT 0.8  PROT 5.9*  ALBUMIN 3.7   No results for input(s): "LIPASE", "AMYLASE" in the last 168 hours. No results for input(s): "AMMONIA" in the last 168 hours. Coagulation  Profile: No results for input(s): "INR", "PROTIME" in the last 168 hours. Cardiac Enzymes: No results for input(s): "CKTOTAL", "CKMB", "CKMBINDEX", "TROPONINI" in the last 168 hours. BNP (last 3 results) No results for input(s): "PROBNP" in the last 8760 hours. HbA1C: Recent Labs    02/10/24 2142  HGBA1C 10.1*   CBG: Recent Labs  Lab 02/12/24 2102 02/13/24 0116 02/13/24 0420 02/13/24 0825 02/13/24 1155  GLUCAP 153* 137* 131* 130* 198*   Lipid Profile: Recent Labs    02/11/24 0458  CHOL 99  HDL 56  LDLCALC 37  TRIG 28  CHOLHDL 1.8   Thyroid Function Tests: No results for input(s): "TSH", "T4TOTAL", "FREET4", "T3FREE", "THYROIDAB" in the last 72 hours. Anemia Panel: No results for input(s): "VITAMINB12", "FOLATE", "  FERRITIN", "TIBC", "IRON", "RETICCTPCT" in the last 72 hours. Urine analysis:    Component Value Date/Time   COLORURINE YELLOW (A) 02/08/2024 2025   APPEARANCEUR CLEAR (A) 02/08/2024 2025   LABSPEC 1.026 02/08/2024 2025   PHURINE 5.0 02/08/2024 2025   GLUCOSEU >=500 (A) 02/08/2024 2025   HGBUR NEGATIVE 02/08/2024 2025   BILIRUBINUR NEGATIVE 02/08/2024 2025   KETONESUR NEGATIVE 02/08/2024 2025   PROTEINUR 100 (A) 02/08/2024 2025   NITRITE NEGATIVE 02/08/2024 2025   LEUKOCYTESUR NEGATIVE 02/08/2024 2025   Sepsis Labs: @LABRCNTIP (procalcitonin:4,lacticidven:4)  ) Recent Results (from the past 240 hours)  Resp panel by RT-PCR (RSV, Flu A&B, Covid) Anterior Nasal Swab     Status: None   Collection Time: 02/10/24  6:54 PM   Specimen: Anterior Nasal Swab  Result Value Ref Range Status   SARS Coronavirus 2 by RT PCR NEGATIVE NEGATIVE Final    Comment: (NOTE) SARS-CoV-2 target nucleic acids are NOT DETECTED.  The SARS-CoV-2 RNA is generally detectable in upper respiratory specimens during the acute phase of infection. The lowest concentration of SARS-CoV-2 viral copies this assay can detect is 138 copies/mL. A negative result does not preclude  SARS-Cov-2 infection and should not be used as the sole basis for treatment or other patient management decisions. A negative result may occur with  improper specimen collection/handling, submission of specimen other than nasopharyngeal swab, presence of viral mutation(s) within the areas targeted by this assay, and inadequate number of viral copies(<138 copies/mL). A negative result must be combined with clinical observations, patient history, and epidemiological information. The expected result is Negative.  Fact Sheet for Patients:  BloggerCourse.com  Fact Sheet for Healthcare Providers:  SeriousBroker.it  This test is no t yet approved or cleared by the [place] FDA and  has been authorized for detection and/or diagnosis of SARS-CoV-2 by FDA under an Emergency Use Authorization (EUA). This EUA will remain  in effect (meaning this test can be used) for the duration of the COVID-19 declaration under Section 564(b)(1) of the Act, 21 U.S.C.section 360bbb-3(b)(1), unless the authorization is terminated  or revoked sooner.       Influenza A by PCR NEGATIVE NEGATIVE Final   Influenza B by PCR NEGATIVE NEGATIVE Final    Comment: (NOTE) The Xpert Xpress SARS-CoV-2/FLU/RSV plus assay is intended as an aid in the diagnosis of influenza from Nasopharyngeal swab specimens and should not be used as a sole basis for treatment. Nasal washings and aspirates are unacceptable for Xpert Xpress SARS-CoV-2/FLU/RSV testing.  Fact Sheet for Patients: BloggerCourse.com  Fact Sheet for Healthcare Providers: SeriousBroker.it  This test is not yet approved or cleared by the [place] FDA and has been authorized for detection and/or diagnosis of SARS-CoV-2 by FDA under an Emergency Use Authorization (EUA). This EUA will remain in effect (meaning this test can be used) for the duration of  the COVID-19 declaration under Section 564(b)(1) of the Act, 21 U.S.C. section 360bbb-3(b)(1), unless the authorization is terminated or revoked.     Resp Syncytial Virus by PCR NEGATIVE NEGATIVE Final    Comment: (NOTE) Fact Sheet for Patients: BloggerCourse.com  Fact Sheet for Healthcare Providers: SeriousBroker.it  This test is not yet approved or cleared by the [place] FDA and has been authorized for detection and/or diagnosis of SARS-CoV-2 by FDA under an Emergency Use Authorization (EUA). This EUA will remain in effect (meaning this test can be used) for the duration of the COVID-19 declaration under Section 564(b)(1) of the Act, 21 U.S.C. section 360bbb-3(b)(1),  unless the authorization is terminated or revoked.  Performed at [facility], [address] [place], [place] [address]          Radiology Studies: NM Pulmonary Perfusion Result Date: 02/12/2024 CLINICAL DATA:  Pulmonary embolism (PE) suspected, low to intermediate prob, positive D-dimer hypoxia, elevated dimer EXAM: NUCLEAR MEDICINE PERFUSION LUNG SCAN TECHNIQUE: Perfusion images were obtained in multiple projections after intravenous injection of radiopharmaceutical. Ventilation scans intentionally deferred if perfusion scan and chest x-ray adequate for interpretation during COVID 19 epidemic. RADIOPHARMACEUTICALS:  4.34 mCi Tc-41m MAA IV COMPARISON:  Chest x-ray 02/11/2024 FINDINGS: Heterogeneous distribution of radiotracer within the bilateral lung fields. Matched perfusion defects of the left mid to lower lung field. No segmental perfusion defect within the right lung. IMPRESSION: Low probability for pulmonary embolism. Electronically Signed   By: Duanne Guess D.O.   On: 02/12/2024 15:57   ECHOCARDIOGRAM COMPLETE Result Date: 02/11/2024    ECHOCARDIOGRAM REPORT   Patient Name:   DOMINIC MAHANEY Date of Exam: 02/11/2024 Medical Rec #:  [id number]      Height:       68.0 in Accession #:    [id number]    Weight:       115.0 lb Date of Birth:  1929/11/10     BSA:          1.615 m Patient Age:    89 years      BP:           133/82 mmHg Patient Gender: M             HR:           84 bpm. Exam Location:  [facility] Procedure: 2D Echo, Cardiac Doppler, Color Doppler and Strain Analysis (Both            Spectral and Color Flow Doppler were utilized during procedure). Indications:     CHF  History:         Patient has prior history of Echocardiogram examinations, most                  recent 02/09/2023. CHF, Angina, CAD and Previous Myocardial                  Infarction, Prior CABG, PAD and COPD, Signs/Symptoms:Shortness                  of Breath; Risk Factors:Hypertension, Diabetes and                  Dyslipidemia. CKD.  Sonographer:     Mikki Harbor Referring Phys:  [id number] Lorretta Harp Diagnosing Phys: Julien Nordmann MD  Sonographer Comments: Image acquisition challenging due to COPD. Global longitudinal strain was attempted. IMPRESSIONS  1. Left ventricular ejection fraction, by estimation, is 25 to 30%. Left ventricular ejection fraction by PLAX is 27 %. The left ventricle has severely decreased function. The left ventricle demonstrates global hypokinesis, akinesis of the distal anteroseptal and apical regions. There is moderate left ventricular hypertrophy. Left ventricular diastolic parameters are consistent with Grade I diastolic dysfunction (impaired relaxation). The average left ventricular global longitudinal strain is -3.8 %. The global longitudinal strain is abnormal.  2. Right ventricular systolic function is moderately reduced. The right ventricular size is normal. Tricuspid regurgitation signal is inadequate for assessing PA pressure.  3. The mitral valve is normal in structure. Mild to moderate mitral valve regurgitation. No evidence of mitral stenosis.  4. The aortic valve is tricuspid. There is  mild calcification of the aortic valve. Aortic valve  regurgitation is not visualized. Aortic valve sclerosis/calcification is present, without any evidence of aortic stenosis. Aortic valve mean gradient measures 4.0 mmHg.  5. The inferior vena cava is normal in size with <50% respiratory variability, suggesting right atrial pressure of 8 mmHg. FINDINGS  Left Ventricle: Left ventricular ejection fraction, by estimation, is 25 to 30%. Left ventricular ejection fraction by PLAX is 27 %. The left ventricle has severely decreased function. The left ventricle demonstrates global hypokinesis. The average left  ventricular global longitudinal strain is -3.8 %. Strain was performed and the global longitudinal strain is abnormal. The left ventricular internal cavity size was normal in size. There is moderate left ventricular hypertrophy. Left ventricular diastolic parameters are consistent with Grade I diastolic dysfunction (impaired relaxation). Right Ventricle: The right ventricular size is normal. No increase in right ventricular wall thickness. Right ventricular systolic function is moderately reduced. Tricuspid regurgitation signal is inadequate for assessing PA pressure. Left Atrium: Left atrial size was normal in size. Right Atrium: Right atrial size was normal in size. Pericardium: There is no evidence of pericardial effusion. Mitral Valve: The mitral valve is normal in structure. Mild to moderate mitral valve regurgitation. No evidence of mitral valve stenosis. MV peak gradient, 2.6 mmHg. The mean mitral valve gradient is 1.0 mmHg. Tricuspid Valve: The tricuspid valve is normal in structure. Tricuspid valve regurgitation is mild . No evidence of tricuspid stenosis. Aortic Valve: The aortic valve is tricuspid. There is mild calcification of the aortic valve. Aortic valve regurgitation is not visualized. Aortic valve sclerosis/calcification is present, without any evidence of aortic stenosis. Aortic valve mean gradient measures 4.0 mmHg. Aortic valve peak gradient  measures 6.2 mmHg. Aortic valve area, by VTI measures 2.12 cm. Pulmonic Valve: The pulmonic valve was normal in structure. Pulmonic valve regurgitation is mild. No evidence of pulmonic stenosis. Aorta: The aortic root is normal in size and structure. Venous: The inferior vena cava is normal in size with less than 50% respiratory variability, suggesting right atrial pressure of 8 mmHg. IAS/Shunts: No atrial level shunt detected by color flow Doppler. Additional Comments: 3D was performed not requiring image post processing on an independent workstation and was indeterminate. There is a small pleural effusion in the left lateral region.  LEFT VENTRICLE PLAX 2D LV EF:         Left            Diastology                ventricular     LV e' medial:    3.70 cm/s                ejection        LV E/e' medial:  20.2                fraction by     LV e' lateral:   4.90 cm/s                PLAX is 27      LV E/e' lateral: 15.3                %. LVIDd:         4.10 cm         2D Longitudinal LVIDs:         3.60 cm         Strain LV PW:         1.60  cm         2D Strain GLS   -3.8 % LV IVS:        1.70 cm         Avg: LVOT diam:     2.10 cm LV SV:         49 LV SV Index:   30 LVOT Area:     3.46 cm  LV Volumes (MOD) LV vol d, MOD    78.6 ml A2C: LV vol d, MOD    49.6 ml A4C: LV vol s, MOD    50.2 ml A2C: LV vol s, MOD    36.3 ml A4C: LV SV MOD A2C:   28.4 ml LV SV MOD A4C:   49.6 ml LV SV MOD BP:    21.1 ml RIGHT VENTRICLE RV Basal diam:  4.15 cm RV Mid diam:    2.80 cm RV S prime:     6.53 cm/s LEFT ATRIUM              Index        RIGHT ATRIUM           Index LA diam:        3.70 cm  2.29 cm/m   RA Area:     21.90 cm LA Vol (A2C):   117.0 ml 72.43 ml/m  RA Volume:   77.10 ml  47.73 ml/m LA Vol (A4C):   42.9 ml  26.56 ml/m LA Biplane Vol: 70.9 ml  43.89 ml/m  AORTIC VALVE                    PULMONIC VALVE AV Area (Vmax):    2.23 cm     PV Vmax:       0.68 m/s AV Area (Vmean):   1.83 cm     PV Peak grad:  1.8 mmHg AV  Area (VTI):     2.12 cm AV Vmax:           125.00 cm/s AV Vmean:          94.000 cm/s AV VTI:            0.230 m AV Peak Grad:      6.2 mmHg AV Mean Grad:      4.0 mmHg LVOT Vmax:         80.40 cm/s LVOT Vmean:        49.800 cm/s LVOT VTI:          0.141 m LVOT/AV VTI ratio: 0.61  AORTA Ao Root diam: 3.30 cm MITRAL VALVE MV Area (PHT): 4.80 cm    SHUNTS MV Area VTI:   2.19 cm    Systemic VTI:  0.14 m MV Peak grad:  2.6 mmHg    Systemic Diam: 2.10 cm MV Mean grad:  1.0 mmHg MV Vmax:       0.80 m/s MV Vmean:      57.7 cm/s MV Decel Time: 158 msec MV E velocity: 74.80 cm/s MV A velocity: 74.20 cm/s MV E/A ratio:  1.01 Julien Nordmann MD Electronically signed by Julien Nordmann MD Signature Date/Time: 02/11/2024/4:45:57 PM    Final         Scheduled Meds:  aspirin EC  81 mg Oral Daily   atorvastatin  40 mg Oral Daily   enoxaparin (LOVENOX) injection  30 mg Subcutaneous Q24H   feeding supplement  237 mL Oral BID BM   multivitamin with minerals  1 tablet Oral Daily  tamsulosin  0.4 mg Oral Daily   Continuous Infusions:  dextrose 30 mL/hr at 02/13/24 0551     LOS: 3 days    Silvano Bilis, MD [facility]   If 7PM-7AM, please contact night-coverage [email or web address] Password [email or web address] 02/13/2024, 1:10 PM

## 2024-02-13 NOTE — Evaluation (Signed)
 Physical Therapy Evaluation Patient Details Name: Peter Becker MRN: [id number] DOB: 09/23/1929 Today's Date: 02/13/2024  History of Present Illness  Pt admitted for acute on chronic CHF. S/p thoracentesis on 3/12. Extensive PMH for heart failure along with pulmonary disease.  Clinical Impression  Pt is a pleasant 89 year old male who was admitted for acute on chronic CHF. Pt performs bed mobility with +2 for repositioning in bed. Pt demonstrates deficits with strength/mobility/endurance. Pt on 40L of O2 with O2 sats at 94% Would benefit from skilled PT to address above deficits and promote optimal return to PLOF. Pt with poor rehab potential and per discussion with care team, possible change in status. Pt will continue to receive skilled PT services while admitted and will defer to TOC/care team for updates regarding disposition planning.         If plan is discharge home, recommend the following: A lot of help with walking and/or transfers   Can travel by private vehicle        Equipment Recommendations Hospital bed;Wheelchair (measurements PT)  Recommendations for Other Services       Functional Status Assessment Patient has had a recent decline in their functional status and/or demonstrates limited ability to make significant improvements in function in a reasonable and predictable amount of time     Precautions / Restrictions Precautions Precautions: Fall Recall of Precautions/Restrictions: Intact Restrictions Weight Bearing Restrictions Per Provider Order: No      Mobility  Bed Mobility               General bed mobility comments: reports he just got OOB to [facility] with staff and declines further attempts for mobility. 2 person assist for scooting up in bed.    Transfers                        Ambulation/Gait                  Stairs            Wheelchair Mobility     Tilt Bed    Modified Rankin (Stroke Patients Only)        Balance                                             Pertinent Vitals/Pain Pain Assessment Pain Assessment: No/denies pain    Home Living Family/patient expects to be discharged to:: Private residence Living Arrangements: Children (daughter and SIL) Available Help at Discharge: Family;Available 24 hours/day Type of Home: House Home Access: Stairs to enter Entrance Stairs-Rails: Right Entrance Stairs-Number of Steps: 4   Home Layout: Able to live on main level with bedroom/bathroom Home Equipment: Rolling Walker (2 wheels);Cane - single point;BSC/3in1      Prior Function Prior Level of Function : Independent/Modified Independent             Mobility Comments: pt is poor historian, falling asleep during history. SIL at bedside and reports PTA, pt mobile with ability to ambulate household distances with occasional use of RW. No recent falls, but a few near miss situations ADLs Comments: mostly indep     Extremity/Trunk Assessment   Upper Extremity Assessment Upper Extremity Assessment: Generalized weakness    Lower Extremity Assessment Lower Extremity Assessment: Generalized weakness (Grossly B LE 3/5)       Communication  Communication Communication: No apparent difficulties    Cognition Arousal: Lethargic Behavior During Therapy: WFL for tasks assessed/performed   PT - Cognitive impairments: No apparent impairments                       PT - Cognition Comments: pt very lethargic throughout session Following commands: Intact       Cueing Cueing Techniques: Verbal cues     General Comments      Exercises     Assessment/Plan    PT Assessment Patient needs continued PT services  PT Problem List Cardiopulmonary status limiting activity;Decreased strength;Decreased balance;Decreased mobility       PT Treatment Interventions Therapeutic activities;Therapeutic exercise;Gait training;DME instruction    PT Goals (Current  goals can be found in the Care Plan section)  Acute Rehab PT Goals Patient Stated Goal: to go home PT Goal Formulation: With patient Time For Goal Achievement: 02/27/24 Potential to Achieve Goals: Poor    Frequency Min 1X/week     Co-evaluation               AM-PAC PT "6 Clicks" Mobility  Outcome Measure Help needed turning from your back to your side while in a flat bed without using bedrails?: A Lot Help needed moving from lying on your back to sitting on the side of a flat bed without using bedrails?: A Lot Help needed moving to and from a bed to a chair (including a wheelchair)?: A Lot Help needed standing up from a chair using your arms (e.g., wheelchair or bedside chair)?: A Lot Help needed to walk in hospital room?: Total Help needed climbing 3-5 steps with a railing? : Total 6 Click Score: 10    End of Session Equipment Utilized During Treatment: Oxygen Activity Tolerance: Patient limited by lethargy Patient left: in bed;with bed alarm set;with family/visitor present Nurse Communication: Mobility status PT Visit Diagnosis: Muscle weakness (generalized) (M62.81);Difficulty in walking, not elsewhere classified (R26.2)    Time: 1610-9604 PT Time Calculation (min) (ACUTE ONLY): 12 min   Charges:   PT Evaluation $PT Eval Low Complexity: 1 Low   PT General Charges $$ ACUTE PT VISIT: 1 Visit         Peter Becker, PT, DPT, GCS [phone]   Peter Becker 02/13/2024, 4:31 PM

## 2024-02-13 NOTE — Consult Note (Signed)
 Consultation Note Date: 02/13/2024   Patient Name: Peter Becker  DOB: 1929-08-14  MRN: [id number]  Age / Sex: 89 y.o., male  PCP: Danella Penton, MD Referring Physician: Kathrynn Running, MD  Reason for Consultation: Establishing goals of care   HPI/Brief Hospital Course: 89 y.o. male  with past medical history of CHF with EF 35-40%, HTN, DM2, CAD s/p CABG x3 in 2007, PVD, CVA in 2018 and CKD stage 3 admitted from home on 02/10/2024 with concern for hyperglycemia. On arrival to ED found to significantly hypoxic with sats in 50's. Found to have elevated BNP at 1652.9 and CXR revealing pulmonary edema and bilateral pleural effusions (L>R).  Has remained on HHFNC at significant Fi02 levels Underwent left thoracentesis 3/12 with removal of 900 cc   Palliative medicine was consulted for assisting with goals of care conversations.  Subjective:  Extensive chart review has been completed prior to meeting patient including labs, vital signs, imaging, progress notes, orders, and available advanced directive documents from current and previous encounters.  Visited with [name] at his bedside. He is awake, alert, oriented and able to engage in conversation. He is soft spoken making conversation a bit difficult.  Introduced myself as a Publishing rights manager as a member of the palliative care team. Explained palliative medicine is specialized medical care for people living with serious illness. It focuses on providing relief from the symptoms and stress of a serious illness. The goal is to improve quality of life for both the patient and the family.   He is oriented to self and place, unaware of current situation and time. During our conversations he references his hospitalization from 2018. He speaks to past events. He shares his wife passed away several years ago. He has a daughter-Peter Becker who he lives with. He shares he also has a son that lives out of state.  He  shares his understanding of hospitalization is due to hyperglycemia, he was unaware of current events.  Assessed symptoms, he denies acute pain or discomfort, lacks an appetite but this it not new for him.  During our conversation, son in law arrived to bedside an requested I call Peter Becker.  Called and spoke with Peter Becker, she shares she has been receiving updates from medical team. Aware of severity of underlying heart failure complicated by underlying comorbid conditions. Shares at baseline, [name] functions basically independently, has some mild cognitive impairment that she has noticed. Peter Becker shares that her brother-Peter Becker serves as Product manager.  Secure chats between SLP, primary team and dietician. Concern for Ms. Odell silently aspirating and in need of modified study but due to high levels of Fi02 currently required not abel to safely transport down. Potential plan to transition to NPO over weekend with hopes to wean down Fi02 to plan for modified test Monday.  Called and spoke with son-Peter Becker, he confirms HCPOA, will attempt to email over copy but he is currently without power. We reviewed hospitalization and current plan of care.  We discussed patient's current illness and what it means in the larger context of patient's on-going co-morbidities. Natural disease trajectory and expectations at EOL were discussed. We discussed severity of Mr. Peter Becker's underlying heart failure, recurrent pleural effusions, frail state, malnutrition and now with concern for aspiration. We discussed tentative plan but also needing to discuss goals of care as a big picture and taking into account all underlying comorbid conditions. We discussed at this time, treatment options are limited and that artificial nutrition support would not be  recommended.  The difference between aggressive medical intervention and comfort care was discussed.  Marcial Pacas shares he is going to attempt to catch a flight and plans to visit at  bedside tomorrow-will continue goals of care conversations at that time.  I discussed importance of continued conversations with family/support persons and all members of their medical team regarding overall plan of care and treatment options ensuring decisions are in alignment with patients goals of care.  All questions/concerns addressed. Emotional support provided to patient/family/support persons. PMT will continue to follow and support patient as needed.   Objective: Primary Diagnoses: Present on Admission:  Acute on chronic systolic CHF (congestive heart failure) (HCC)  CAD (coronary artery disease)  Myocardial injury  Essential hypertension  Hyperlipidemia  Chronic kidney disease, stage 3a (HCC)  Type II diabetes mellitus with renal manifestations (HCC)  Protein-calorie malnutrition, severe (HCC)  BPH (benign prostatic hyperplasia)  Abnormal LFTs  Acute respiratory failure with hypoxia (HCC)  Ischemic stroke (HCC)  PAD (peripheral artery disease) (HCC)   Physical Exam Constitutional:      General: He is not in acute distress.    Appearance: He is ill-appearing.  Pulmonary:     Effort: Pulmonary effort is normal. No respiratory distress.     Breath sounds: Rhonchi present.  Skin:    General: Skin is warm and dry.  Neurological:     Mental Status: He is alert.     Motor: Weakness present.     Vital Signs: BP (!) 101/53 (BP Location: Left Arm)   Pulse 84   Temp 98.5 F (36.9 C)   Resp 18   Wt 57.1 kg   SpO2 96%   BMI 19.14 kg/m  Pain Scale: 0-10   Pain Score: 0-No pain   IO: Intake/output summary:  Intake/Output Summary (Last 24 hours) at 02/13/2024 1723 Last data filed at 02/13/2024 0600 Gross per 24 hour  Intake 705.63 ml  Output 400 ml  Net 305.63 ml    LBM:   Baseline Weight: Weight: 57.1 kg Most recent weight: Weight: 57.1 kg      Assessment and Plan  SUMMARY OF RECOMMENDATIONS   Son to visit at bedside over weekend-continue GOC at that  time  Palliative Prophylaxis:   Bowel Regimen, Delirium Protocol and Frequent Pain Assessment   Discussed With: Primary team, nursing staff, SLP and dietician   Thank you for this consult and allowing Palliative Medicine to participate in the care of Clayten Allcock. Corvino. Palliative medicine will continue to follow and assist as needed.   Time Total: 75 minutes  Time spent includes: Detailed review of medical records (labs, imaging, vital signs), medically appropriate exam (mental status, respiratory, cardiac, skin), discussed with treatment team, counseling and educating patient, family and staff, documenting clinical information, medication management and coordination of care.   Signed by: Leeanne Deed, DNP, AGNP-C Palliative Medicine    Please contact Palliative Medicine Team phone at [phone] for questions and concerns.  For individual provider: See Loretha Stapler

## 2024-02-13 NOTE — Progress Notes (Signed)
 Initial Nutrition Assessment  DOCUMENTATION CODES:   Severe malnutrition in context of chronic illness  INTERVENTION:   -Liberalize diet to regular for widest variety of meal selections -MVI with minerals daily -Continue Ensure Enlive po BID, each supplement provides 350 kcal and 20 grams of protein.  -Magic cup TID with meals, each supplement provides 290 kcal and 9 grams of protein  -Agree with palliative care consult for goals of care discussions; RD will make further recommendations based upon determined goals of care  NUTRITION DIAGNOSIS:   Severe Malnutrition related to chronic illness (CHF) as evidenced by moderate fat depletion, severe fat depletion, moderate muscle depletion, severe muscle depletion.  GOAL:   Patient will meet greater than or equal to 90% of their needs  MONITOR:   PO intake, Supplement acceptance  REASON FOR ASSESSMENT:   Consult Assessment of nutrition requirement/status  ASSESSMENT:   Pt with medical history significant of sCHF with EF 35-40%, HTN, HLD, DM, CAD, CABG, PVD, stroke, anxiety, CKD-3a, who presents with SOB.  Pt admitted with CHF.   3/12- US guided right thoracentesis (900 ml removed)  Reviewed I/O's: -61 ml x 24 hours and -661 ml since admission  UOP: 1 L x 24 hours  Pt lying in bed, on HHFNC at time of visit. Pt lethargic and did not arouse to voice or touch. No family at bedside.   Noted that breakfast tray was in front of pt, untouched. Pt is currently on a heart healthy, carb modified diet. No documented meal completions recorded at this time.   Per H&P, pt had been very functional at home, but has recently experienced a general decline. Pt with poor prognosis and may benefit from hospice services. Palliative care consult pending for goals of care.   Given pt's besides of hypoglycemia in the hospital and malnutrition, pt would benefit from addition of oral nutrition supplements and liberalized diet for widest variety of  meal selections. Given pt's advanced age and multiple co-morbidities, RD would be hesitant to recommend nutrition support, as this would not enhance pt's quality of life. Recommendations shared with MD, palliative care team, and RN.   Wt has been stable over the past year.   Medications reviewed and include lovenox, lasix, and dextrose 10% infusion @ 30 ml/hr.   Lab Results  Component Value Date   HGBA1C 10.1 (H) 02/10/2024   PTA DM medications are 10 mg dapagliflozin propanediol daily, 15 mg pioglitazone daily, and 4 mg glimepride daily. Per chart review, pt has been experiencing hyperglycemia PTA and reached out to endocrinology; he also went to urgent care who referred him to the ED.   Labs reviewed: CBGS: 131-216 (inpatient orders for glycemic control are none).  Noted hypoglycemic episodes on 02/12/24  NUTRITION - FOCUSED PHYSICAL EXAM:  Flowsheet Row Most Recent Value  Orbital Region Severe depletion  Upper Arm Region Severe depletion  Thoracic and Lumbar Region Severe depletion  Buccal Region Severe depletion  Temple Region Severe depletion  Clavicle and Acromion Bone Region Severe depletion  Scapular Bone Region Severe depletion  Dorsal Hand Severe depletion  Patellar Region Moderate depletion  Anterior Thigh Region Moderate depletion  Posterior Calf Region Moderate depletion  Edema (RD Assessment) Mild  Hair Reviewed  Eyes Reviewed  Mouth Reviewed  Skin Reviewed  Nails Reviewed       Diet Order:   Diet Order             Diet heart healthy/carb modified Room service appropriate? No; Fluid consistency: Thin  Diet effective now                   EDUCATION NEEDS:   Not appropriate for education at this time  Skin:  Skin Assessment: Reviewed RN Assessment  Last BM:  Unknown  Height:   Ht Readings from Last 1 Encounters:  02/08/24 5\' 8"  (1.727 m)    Weight:   Wt Readings from Last 1 Encounters:  02/13/24 57.1 kg    Ideal Body Weight:  70  kg  BMI:  Body mass index is 19.14 kg/m.  Estimated Nutritional Needs:   Kcal:  1700-1900  Protein:  85-100 grams  Fluid:  1.7-1.9 L    Levada Schilling, RD, LDN, CDCES Registered Dietitian III Certified Diabetes Care and Education Specialist If unable to reach this RD, please use "RD Inpatient" group chat on secure chat between hours of 8am-4 pm daily

## 2024-02-14 DIAGNOSIS — J189 Pneumonia, unspecified organism: Secondary | ICD-10-CM

## 2024-02-14 DIAGNOSIS — I5023 Acute on chronic systolic (congestive) heart failure: Secondary | ICD-10-CM | POA: Diagnosis not present

## 2024-02-14 DIAGNOSIS — Z9889 Other specified postprocedural states: Secondary | ICD-10-CM | POA: Diagnosis not present

## 2024-02-14 DIAGNOSIS — Z515 Encounter for palliative care: Secondary | ICD-10-CM | POA: Diagnosis not present

## 2024-02-14 DIAGNOSIS — R739 Hyperglycemia, unspecified: Secondary | ICD-10-CM

## 2024-02-14 DIAGNOSIS — I509 Heart failure, unspecified: Secondary | ICD-10-CM

## 2024-02-14 DIAGNOSIS — J90 Pleural effusion, not elsewhere classified: Secondary | ICD-10-CM | POA: Diagnosis not present

## 2024-02-14 LAB — BASIC METABOLIC PANEL
Anion gap: 8 (ref 5–15)
BUN: 59 mg/dL — ABNORMAL HIGH (ref 8–23)
CO2: 29 mmol/L (ref 22–32)
Calcium: 8 mg/dL — ABNORMAL LOW (ref 8.9–10.3)
Chloride: 99 mmol/L (ref 98–111)
Creatinine, Ser: 1.61 mg/dL — ABNORMAL HIGH (ref 0.61–1.24)
GFR, Estimated: 40 mL/min — ABNORMAL LOW (ref >60)
Glucose, Bld: 229 mg/dL — ABNORMAL HIGH (ref 70–99)
Potassium: 4.1 mmol/L (ref 3.5–5.1)
Sodium: 136 mmol/L (ref 135–145)

## 2024-02-14 LAB — CHOLESTEROL, BODY FLUID: Cholesterol, Fluid: 26 mg/dL

## 2024-02-14 LAB — GLUCOSE, CAPILLARY
Glucose-Capillary: 208 mg/dL — ABNORMAL HIGH (ref 70–99)
Glucose-Capillary: 215 mg/dL — ABNORMAL HIGH (ref 70–99)
Glucose-Capillary: 244 mg/dL — ABNORMAL HIGH (ref 70–99)
Glucose-Capillary: 252 mg/dL — ABNORMAL HIGH (ref 70–99)
Glucose-Capillary: >600 mg/dL (ref 70–99)

## 2024-02-14 MED ORDER — POLYVINYL ALCOHOL 1.4 % OP SOLN: 1.0000 [drp] | Freq: Four times a day (QID) | OPHTHALMIC | Status: DC | PRN

## 2024-02-14 MED ORDER — ACETAMINOPHEN 650 MG RE SUPP: 650.0000 mg | Freq: Four times a day (QID) | RECTAL | Status: DC | PRN

## 2024-02-14 MED ORDER — HALOPERIDOL LACTATE 5 MG/ML IJ SOLN: 0.5000 mg | INTRAMUSCULAR | Status: DC | PRN

## 2024-02-14 MED ORDER — HALOPERIDOL 0.5 MG PO TABS: 0.5000 mg | ORAL_TABLET | ORAL | Status: DC | PRN

## 2024-02-14 MED ORDER — ONDANSETRON HCL 4 MG/2ML IJ SOLN: 4.0000 mg | Freq: Four times a day (QID) | INTRAMUSCULAR | Status: DC | PRN

## 2024-02-14 MED ORDER — LORAZEPAM 2 MG/ML IJ SOLN: 1.0000 mg | INTRAMUSCULAR | Status: DC | PRN

## 2024-02-14 MED ORDER — ACETAMINOPHEN 325 MG PO TABS
650.0000 mg | ORAL_TABLET | Freq: Four times a day (QID) | ORAL | Status: DC | PRN
  Administered 2024-02-15: 650 mg via ORAL
  Filled 2024-02-14: qty 2

## 2024-02-14 MED ORDER — GLYCOPYRROLATE 0.2 MG/ML IJ SOLN: 0.2000 mg | INTRAMUSCULAR | Status: DC | PRN

## 2024-02-14 MED ORDER — MORPHINE SULFATE (CONCENTRATE) 10 MG /0.5 ML PO SOLN: 5.0000 mg | ORAL | Status: DC | PRN

## 2024-02-14 MED ORDER — HALOPERIDOL LACTATE 2 MG/ML PO CONC: 0.5000 mg | ORAL | Status: DC | PRN

## 2024-02-14 MED ORDER — GLYCOPYRROLATE 0.2 MG/ML IJ SOLN
0.2000 mg | INTRAMUSCULAR | Status: DC | PRN
  Administered 2024-02-15: 0.2 mg via INTRAVENOUS
  Filled 2024-02-14: qty 1

## 2024-02-14 MED ORDER — ONDANSETRON 4 MG PO TBDP
4.0000 mg | ORAL_TABLET | Freq: Four times a day (QID) | ORAL | Status: DC | PRN
Start: 2024-02-14 — End: 2024-02-17

## 2024-02-14 MED ORDER — GLYCOPYRROLATE 1 MG PO TABS: 1.0000 mg | ORAL_TABLET | ORAL | Status: DC | PRN

## 2024-02-14 MED ORDER — LORAZEPAM 2 MG/ML PO CONC: 1.0000 mg | ORAL | Status: DC | PRN

## 2024-02-14 MED ORDER — LORAZEPAM 1 MG PO TABS: 1.0000 mg | ORAL_TABLET | ORAL | Status: DC | PRN

## 2024-02-14 MED ORDER — BIOTENE DRY MOUTH MT LIQD: 15.0000 mL | OROMUCOSAL | Status: DC | PRN

## 2024-02-14 NOTE — Progress Notes (Signed)
 Daily Progress Note   Patient Name: Peter Becker      Date: 02/14/2024 DOB: 31-Aug-1929  Age: 89 y.o. MRN#: [id number] Attending Physician: Kathrynn Running, MD Primary Care Physician: Danella Penton, MD Admit Date: 02/10/2024  Reason for Consultation/Follow-up: Establishing goals of care  HPI/Brief Hospital Review: 89 y.o. male  with past medical history of CHF with EF 35-40%, HTN, DM2, CAD s/p CABG x3 in 2007, PVD, CVA in 2018 and CKD stage 3 admitted from home on 02/10/2024 with concern for hyperglycemia. On arrival to ED found to significantly hypoxic with sats in 50's. Found to have elevated BNP at 1652.9 and CXR revealing pulmonary edema and bilateral pleural effusions (L>R).   Has remained on HHFNC at significant Fi02 levels Underwent left thoracentesis 3/12 with removal of 900 cc    Palliative medicine was consulted for assisting with goals of care conversations.  Subjective: Extensive chart review has been completed prior to meeting patient including labs, vital signs, imaging, progress notes, orders, and available advanced directive documents from current and previous encounters.    Visited with Peter Becker at his bedside. He is attempting to eat breakfast, daughter at bedside during our visit.  Spoke with daughter outside of room, reviewed conversation had with her brother Peter Becker) yesterday. We discussed in detail recommendations made by dietician as well as SLP-poor baseline nutritional status due to multiple underlying comorbid conditions, artificial nutritional support not recommended at this stage in disease, risk of aspiration and recommendation for NPO status and further testing on Monday. Peter Becker shares she and Peter Becker spoke last night and they are both in agreement to avoid  NPO status and not pursue further testing for aspiration. They are aware of risk.  We also discussed comfort care in detail. Peter Becker would no longer receive aggressive medical interventions such as continuous vital signs, lab work, radiology testing, or medications not focused on comfort. All care would focus on how the patient is looking and feeling. This would include management of any symptoms that may cause discomfort, pain, shortness of breath, cough, nausea, agitation, anxiety, and/or secretions etc. Symptoms would be managed with medications and other non-pharmacological interventions such as spiritual support if requested, repositioning, music therapy, or therapeutic listening. Family verbalized understanding and appreciation.   Spoke with Peter Pacas over the phone and again  reviewed all above information. Tim shares he would like for [place] to collaboratively have a conversation with Peter Becker and explain process of comfort care.  Met again at bedside with Peter Becker via FaceTime and Peter Becker at bedside. Reviewed hospitalization, recommendations from all providers and transition to comfort care. Peter Becker shares he is ready to transition to comfort care and shares "it sounds great."  Orders place for comfort: Morphine PRN for pain/air hunger/comfort Robinul PRN for excessive secretions Ativan PRN for agitation/anxiety Zofran PRN for nausea Liquifilm tears PRN for dry eyes Haldol PRN for agitation/anxiety May have comfort feeding Comfort cart for family Unrestricted visitations in the setting of EOL (per policy) Oxygen PRN 2L or less for comfort. No escalation.    Secure chat sent to care team, RT to assist with slowly weaning from [facility] while nursing assessing and treating symptoms as they arise.  Will assess for hospice appropriateness 3/16.  Answered and addressed all questions and concerns. PMT to continue to follow for ongoing needs and support.   Palliative Care Assessment & Plan    Assessment/Recommendation/Plan  DNR/DNI/Comfort  Care plan was discussed with primary team, nursing staff and RT  Thank you for allowing the Palliative Medicine Team to assist in the care of this patient.  Total time:  65 minutes  Time spent includes: Detailed review of medical records (labs, imaging, vital signs), medically appropriate exam (mental status, respiratory, cardiac, skin), discussed with treatment team, counseling and educating patient, family and staff, documenting clinical information, medication management and coordination of care.  Leeanne Deed, DNP, AGNP-C Palliative Medicine   Please contact Palliative Medicine Team phone at [phone] for questions and concerns.

## 2024-02-14 NOTE — Progress Notes (Signed)
 Rounding Note    Patient Name: Peter Becker Date of Encounter: 02/14/2024  [facility] Cardiologist: None   Subjective   Feeling well.  Tired.  Wants to smoke.   Inpatient Medications    Scheduled Meds:  aspirin EC  81 mg Oral Daily   atorvastatin  40 mg Oral Daily   enoxaparin (LOVENOX) injection  30 mg Subcutaneous Q24H   feeding supplement  237 mL Oral BID BM   multivitamin with minerals  1 tablet Oral Daily   tamsulosin  0.4 mg Oral Daily   Continuous Infusions:  dextrose 30 mL/hr at 02/13/24 0551   PRN Meds: acetaminophen, albuterol, dextromethorphan-guaiFENesin, hydrALAZINE, ondansetron (ZOFRAN) IV   Vital Signs    Vitals:   02/14/24 0513 02/14/24 0755 02/14/24 0814 02/14/24 1212  BP:  114/64  112/60  Pulse:  86  84  Resp:      Temp:  98.9 F (37.2 C)  98.1 F (36.7 C)  TempSrc:      SpO2:  96% 96% 96%  Weight: 49.9 kg       Intake/Output Summary (Last 24 hours) at 02/14/2024 1238 Last data filed at 02/14/2024 0400 Gross per 24 hour  Intake --  Output 700 ml  Net -700 ml      02/14/2024    5:13 AM 02/13/2024    5:14 AM 02/08/2024    7:08 PM  Last 3 Weights  Weight (lbs) 110 lb 125 lb 14.4 oz 115 lb  Weight (kg) 49.896 kg 57.108 kg 52.164 kg      Telemetry    Sinus rhythm.  PVCs.  - Personally Reviewed  ECG    N/a - Personally Reviewed  Physical Exam   VS:  BP 112/60 (BP Location: Right Arm)   Pulse 84   Temp 98.1 F (36.7 C)   Resp 16   Wt 49.9 kg   SpO2 96%   BMI 16.73 kg/m  , BMI Body mass index is 16.73 kg/m. GENERAL:  Frail. No acute distress.  HEENT: Pupils equal round and reactive, fundi not visualized, oral mucosa unremarkable NECK:  No jugular venous distention, waveform within normal limits, carotid upstroke brisk and symmetric, no bruits, no thyromegaly LYMPHATICS:  No cervical adenopathy LUNGS:  Clear to auscultation bilaterally HEART:  RRR.  PMI not displaced or sustained,S1 and S2 within normal limits, no  S3, no S4, no clicks, no rubs, no murmurs ABD:  Flat, positive bowel sounds normal in frequency in pitch, no bruits, no rebound, no guarding, no midline pulsatile mass, no hepatomegaly, no splenomegaly EXT:  2 plus pulses throughout, no edema, no cyanosis no clubbing SKIN:  No rashes no nodules NEURO:  Cranial nerves II through XII grossly intact, motor grossly intact throughout PSYCH:  Cognitively intact, oriented to person place and time   Labs    High Sensitivity Troponin:   Recent Labs  Lab 02/10/24 1854 02/10/24 2142 02/11/24 0222 02/11/24 0458 02/11/24 0855  TROPONINIHS 193* 202* 217* 234* 215*     Chemistry Recent Labs  Lab 02/10/24 1854 02/11/24 0458 02/11/24 0855 02/12/24 0430 02/13/24 0504 02/14/24 0248  NA 139 142  --  142 138 136  K 5.1 5.6*   < > 4.5 4.2 4.1  CL 102 108  --  106 98 99  CO2 27 27  --  26 29 29   GLUCOSE 345* 71  --  50* 135* 229*  BUN 38* 40*  --  43* 53* 59*  CREATININE 1.50* 1.52*  --  1.65* 1.74* 1.61*  CALCIUM 8.8* 8.3*  --  8.1* 8.3* 8.0*  MG  --  2.3  --   --   --   --   PROT 5.9*  --   --   --   --   --   ALBUMIN 3.7  --   --   --   --   --   AST 84*  --   --   --   --   --   ALT 65*  --   --   --   --   --   ALKPHOS 89  --   --   --   --   --   BILITOT 0.8  --   --   --   --   --   GFRNONAA 43* 42*  --  38* 36* 40*  ANIONGAP 10 7  --  10 11 8    < > = values in this interval not displayed.    Lipids  Recent Labs  Lab 02/11/24 0458  CHOL 99  TRIG 28  HDL 56  LDLCALC 37  CHOLHDL 1.8    Hematology Recent Labs  Lab 02/11/24 0458 02/12/24 0430 02/13/24 0504  WBC 7.5 8.0 7.2  RBC 3.88* 3.87* 3.56*  HGB 13.0 12.9* 12.0*  HCT 40.5 40.7 37.7*  MCV 104.4* 105.2* 105.9*  MCH 33.5 33.3 33.7  MCHC 32.1 31.7 31.8  RDW 13.0 13.1 12.9  PLT 188 164 157   Thyroid No results for input(s): "TSH", "FREET4" in the last 168 hours.  BNP Recent Labs  Lab 02/10/24 1854  BNP 1,652.9*    DDimer  Recent Labs  Lab 02/11/24 1032   DDIMER 2.48*     Radiology    NM Pulmonary Perfusion Result Date: 02/12/2024 CLINICAL DATA:  Pulmonary embolism (PE) suspected, low to intermediate prob, positive D-dimer hypoxia, elevated dimer EXAM: NUCLEAR MEDICINE PERFUSION LUNG SCAN TECHNIQUE: Perfusion images were obtained in multiple projections after intravenous injection of radiopharmaceutical. Ventilation scans intentionally deferred if perfusion scan and chest x-ray adequate for interpretation during COVID 19 epidemic. RADIOPHARMACEUTICALS:  4.34 mCi Tc-73m MAA IV COMPARISON:  Chest x-ray 02/11/2024 FINDINGS: Heterogeneous distribution of radiotracer within the bilateral lung fields. Matched perfusion defects of the left mid to lower lung field. No segmental perfusion defect within the right lung. IMPRESSION: Low probability for pulmonary embolism. Electronically Signed   By: Duanne Guess D.O.   On: 02/12/2024 15:57    Cardiac Studies   Echo 02/11/24:   1. Left ventricular ejection fraction, by estimation, is 25 to 30%. Left  ventricular ejection fraction by PLAX is 27 %. The left ventricle has  severely decreased function. The left ventricle demonstrates global  hypokinesis, akinesis of the distal  anteroseptal and apical regions. There is moderate left ventricular  hypertrophy. Left ventricular diastolic parameters are consistent with  Grade I diastolic dysfunction (impaired relaxation). The average left  ventricular global longitudinal strain is  -3.8 %. The global longitudinal strain is abnormal.   2. Right ventricular systolic function is moderately reduced. The right  ventricular size is normal. Tricuspid regurgitation signal is inadequate  for assessing PA pressure.   3. The mitral valve is normal in structure. Mild to moderate mitral valve  regurgitation. No evidence of mitral stenosis.   4. The aortic valve is tricuspid. There is mild calcification of the  aortic valve. Aortic valve regurgitation is not  visualized. Aortic valve  sclerosis/calcification is present, without any evidence of  aortic  stenosis. Aortic valve mean gradient  measures 4.0 mmHg.   5. The inferior vena cava is normal in size with <50% respiratory  variability, suggesting right atrial pressure of 8 mmHg.   Patient Profile     89 y.o. male with chronic systolic and diastolic HF, CAD s/pCABG, DM, and CKD 3a admitted with acute on chronic HFrEF.  Assessment & Plan    # Acute on chronic HFrEF:  Echo with reduction in LVEF to 250-30% down from 35-40% previously.  He has biventricular hypertrophy and  concern for cardiac amyloidoiss.  Myeloma panel is pending.  IV diuretics have been held.  Will add lasix 40mg  po daily.  BP too low to resume ARB.  Will try tomorrow.  # Recurrent pleural effusion:  S/p R thoracentesis this admission.   # CAD s/p CABG:  Not an active issue.  # CKD:  Creatinine stable today.  Resuming oral lasix as above.   Advanced HF to see him again Monday.  Options are limited by age, frailty and comorbidities.     For questions or updates, please contact [facility] Please consult [email or web address] for contact info under        Signed, Chilton Si, MD  02/14/2024, 12:38 PM

## 2024-02-14 NOTE — Progress Notes (Signed)
 PROGRESS NOTE    ZYGMUND PASSERO  GBT:[id number] DOB: 06-08-1929 DOA: 02/10/2024 PCP: Danella Penton, MD  Outpatient Specialists: cardiology    Brief Narrative:   From admission h and p  BRAISON SNOKE is a 89 y.o. male with medical history significant of sCHF with EF 35-40%, HTN, HLD, DM, CAD, CABG, PVD, stroke, anxiety, CKD-3a, who presents with SOB.   Pt states that he has shortness of breath in the past several days, which has been progressively worsening.  Patient has mild dry cough, no chest pain, fever or chills.  Patient was found to have oxygen desaturation to 54% on room air, with acute respiratory distress, difficulty speaking in full sentence. Initially started on high flow nasal cannula oxygen, and then titrated down to 5 L oxygen with 94% of saturation.  Patient does not have nausea, vomiting, diarrhea or abdominal pain.  No symptoms of UTI.  No fever or chills.  Patient states that his blood sugar has been elevated in the past several weeks. He has tried to make an appointment with Endocrinologist but they "have to review his chart" before scheduling an appointment  Assessment & Plan:   Principal Problem:   Acute on chronic systolic CHF (congestive heart failure) (HCC) Active Problems:   Acute respiratory failure with hypoxia (HCC)   CAD (coronary artery disease)   Myocardial injury   Essential hypertension   Hyperlipidemia   Ischemic stroke (HCC)   Type II diabetes mellitus with renal manifestations (HCC)   Chronic kidney disease, stage 3a (HCC)   BPH (benign prostatic hyperplasia)   Abnormal LFTs   Protein-calorie malnutrition, severe (HCC)   PAD (peripheral artery disease) (HCC)   Status post thoracentesis   Acute on chronic congestive heart failure (HCC)  # End-of-life care Severe hfref, we have diuresed as much as kidneys will allow and remains on high flow nasal cannula, very debilitated, limited PO, for several days now. Prognosis is poor. Palliative engaged,  after family meeting today, decision to transition to comfort care. - comfort care orders placed - will monitor inpatient overnight. If clinically remains, stable, family interested in home with hospice  # Hyperglycemia # T2DM Despite what h and p says, patient reports (and initial ED provider report) says patient presented with concern for hyperglycemia. A1c is in the 10s. Here intermittent hypoglycemia   # Hypoxia 2/2 decompensated chf, pleural effusion. Relatively asymptomatic but requiring high flow to maintain his sats. No dyspnea, low procal. Trops are int he 200s but no chest pain, no overt ischemic changes on EKG. Vq scan no PE  # biventricular HFrEF, decompensated Bnp 1600s, hypoxic, cxr with pulm edema, also mod left pleural effusion. EF 25-30 which is a decline from last check, also RV dysfunction. Concern for amyloid heart disease - coreg held given decompensated chf - diuresed with IV lasix, paused 3/14 for up-trending creatinine - not a candidate for additional therapeutics per cardiology  # Pleural effusion Likely 2/2 chf. diagnostic/therapeutic thoracentesis performed on 3/12, out 900 ml. Consistent with transudate likely from decompensated chf.  # BPH  # Dysphagia Dysphagia 2 diet  # HTN  # CAD # PAD Remote history cabg. Asymptomatic  # ckd 3a Cr 1.7 bit above baseline of 1.2-1.3, likely some degree of cardiorenal vs diuretic effect  DVT prophylaxis: none Code Status: dnr- comfort Family Communication: son who is hcpoa updated telephonically 3/15  Level of care: Progressive Status is: Inpatient Remains inpatient appropriate because: severity of illness    Consultants:  Advanced heart failure team  Procedures: Thoracentesis 3/12  Antimicrobials:  none    Subjective: Breathing stable, no pain or other complaints.    Objective: Vitals:   02/14/24 0755 02/14/24 0814 02/14/24 1212 02/14/24 1415  BP: 114/64  112/60   Pulse: 86  84   Resp:       Temp: 98.9 F (37.2 C)  98.1 F (36.7 C)   TempSrc:      SpO2: 96% 96% 96% 95%  Weight:        Intake/Output Summary (Last 24 hours) at 02/14/2024 1536 Last data filed at 02/14/2024 0400 Gross per 24 hour  Intake --  Output 700 ml  Net -700 ml   Filed Weights   02/13/24 0514 02/14/24 0513  Weight: 57.1 kg 49.9 kg    Examination:  General exam: Appears calm and comfortable, chronically ill appearing Respiratory system: decreased breat sounds at bases, scattered chronchi Cardiovascular system: S1 & S2 heard, RR, distant heart sounds Gastrointestinal system: Abdomen is nondistended, soft and nontender.   Central nervous system: Alert and oriented to self, able to move all 4 equally Extremities: Symmetric 5 x 5 power. Trace LE edema Skin: No rashes, lesions or ulcers Psychiatry: calm    Data Reviewed: I have personally reviewed following labs and imaging studies  CBC: Recent Labs  Lab 02/08/24 1923 02/10/24 1854 02/11/24 0458 02/12/24 0430 02/13/24 0504  WBC 6.2 6.2 7.5 8.0 7.2  NEUTROABS  --  4.8  --   --   --   HGB 13.0 13.5 13.0 12.9* 12.0*  HCT 41.1 42.3 40.5 40.7 37.7*  MCV 105.4* 103.4* 104.4* 105.2* 105.9*  PLT 180 184 188 164 157   Basic Metabolic Panel: Recent Labs  Lab 02/10/24 1854 02/11/24 0458 02/11/24 0855 02/12/24 0430 02/13/24 0504 02/14/24 0248  NA 139 142  --  142 138 136  K 5.1 5.6* 4.3 4.5 4.2 4.1  CL 102 108  --  106 98 99  CO2 27 27  --  26 29 29   GLUCOSE 345* 71  --  50* 135* 229*  BUN 38* 40*  --  43* 53* 59*  CREATININE 1.50* 1.52*  --  1.65* 1.74* 1.61*  CALCIUM 8.8* 8.3*  --  8.1* 8.3* 8.0*  MG  --  2.3  --   --   --   --    GFR: Estimated Creatinine Clearance: 20.2 mL/min (A) (by C-G formula based on SCr of 1.61 mg/dL (H)). Liver Function Tests: Recent Labs  Lab 02/10/24 1854  AST 84*  ALT 65*  ALKPHOS 89  BILITOT 0.8  PROT 5.9*  ALBUMIN 3.7   No results for input(s): "LIPASE", "AMYLASE" in the last 168  hours. No results for input(s): "AMMONIA" in the last 168 hours. Coagulation Profile: No results for input(s): "INR", "PROTIME" in the last 168 hours. Cardiac Enzymes: No results for input(s): "CKTOTAL", "CKMB", "CKMBINDEX", "TROPONINI" in the last 168 hours. BNP (last 3 results) No results for input(s): "PROBNP" in the last 8760 hours. HbA1C: No results for input(s): "HGBA1C" in the last 72 hours.  CBG: Recent Labs  Lab 02/13/24 2358 02/14/24 0431 02/14/24 0814 02/14/24 0817 02/14/24 1118  GLUCAP 244* 252* >600* 208* 215*   Lipid Profile: No results for input(s): "CHOL", "HDL", "LDLCALC", "TRIG", "CHOLHDL", "LDLDIRECT" in the last 72 hours.  Thyroid Function Tests: No results for input(s): "TSH", "T4TOTAL", "FREET4", "T3FREE", "THYROIDAB" in the last 72 hours. Anemia Panel: No results for input(s): "VITAMINB12", "FOLATE", "FERRITIN", "TIBC", "IRON", "  RETICCTPCT" in the last 72 hours. Urine analysis:    Component Value Date/Time   COLORURINE YELLOW (A) 02/08/2024 2025   APPEARANCEUR CLEAR (A) 02/08/2024 2025   LABSPEC 1.026 02/08/2024 2025   PHURINE 5.0 02/08/2024 2025   GLUCOSEU >=500 (A) 02/08/2024 2025   HGBUR NEGATIVE 02/08/2024 2025   BILIRUBINUR NEGATIVE 02/08/2024 2025   KETONESUR NEGATIVE 02/08/2024 2025   PROTEINUR 100 (A) 02/08/2024 2025   NITRITE NEGATIVE 02/08/2024 2025   LEUKOCYTESUR NEGATIVE 02/08/2024 2025   Sepsis Labs: @LABRCNTIP (procalcitonin:4,lacticidven:4)  ) Recent Results (from the past 240 hours)  Resp panel by RT-PCR (RSV, Flu A&B, Covid) Anterior Nasal Swab     Status: None   Collection Time: 02/10/24  6:54 PM   Specimen: Anterior Nasal Swab  Result Value Ref Range Status   SARS Coronavirus 2 by RT PCR NEGATIVE NEGATIVE Final    Comment: (NOTE) SARS-CoV-2 target nucleic acids are NOT DETECTED.  The SARS-CoV-2 RNA is generally detectable in upper respiratory specimens during the acute phase of infection. The lowest concentration of  SARS-CoV-2 viral copies this assay can detect is 138 copies/mL. A negative result does not preclude SARS-Cov-2 infection and should not be used as the sole basis for treatment or other patient management decisions. A negative result may occur with  improper specimen collection/handling, submission of specimen other than nasopharyngeal swab, presence of viral mutation(s) within the areas targeted by this assay, and inadequate number of viral copies(<138 copies/mL). A negative result must be combined with clinical observations, patient history, and epidemiological information. The expected result is Negative.  Fact Sheet for Patients:  BloggerCourse.com  Fact Sheet for Healthcare Providers:  SeriousBroker.it  This test is no t yet approved or cleared by the [place] FDA and  has been authorized for detection and/or diagnosis of SARS-CoV-2 by FDA under an Emergency Use Authorization (EUA). This EUA will remain  in effect (meaning this test can be used) for the duration of the COVID-19 declaration under Section 564(b)(1) of the Act, 21 U.S.C.section 360bbb-3(b)(1), unless the authorization is terminated  or revoked sooner.       Influenza A by PCR NEGATIVE NEGATIVE Final   Influenza B by PCR NEGATIVE NEGATIVE Final    Comment: (NOTE) The Xpert Xpress SARS-CoV-2/FLU/RSV plus assay is intended as an aid in the diagnosis of influenza from Nasopharyngeal swab specimens and should not be used as a sole basis for treatment. Nasal washings and aspirates are unacceptable for Xpert Xpress SARS-CoV-2/FLU/RSV testing.  Fact Sheet for Patients: BloggerCourse.com  Fact Sheet for Healthcare Providers: SeriousBroker.it  This test is not yet approved or cleared by the [place] FDA and has been authorized for detection and/or diagnosis of SARS-CoV-2 by FDA under an Emergency Use  Authorization (EUA). This EUA will remain in effect (meaning this test can be used) for the duration of the COVID-19 declaration under Section 564(b)(1) of the Act, 21 U.S.C. section 360bbb-3(b)(1), unless the authorization is terminated or revoked.     Resp Syncytial Virus by PCR NEGATIVE NEGATIVE Final    Comment: (NOTE) Fact Sheet for Patients: BloggerCourse.com  Fact Sheet for Healthcare Providers: SeriousBroker.it  This test is not yet approved or cleared by the [place] FDA and has been authorized for detection and/or diagnosis of SARS-CoV-2 by FDA under an Emergency Use Authorization (EUA). This EUA will remain in effect (meaning this test can be used) for the duration of the COVID-19 declaration under Section 564(b)(1) of the Act, 21 U.S.C. section 360bbb-3(b)(1), unless the authorization  is terminated or revoked.  Performed at [facility], [address], [place], [place] [address]          Radiology Studies: No results found.       Scheduled Meds:  feeding supplement  237 mL Oral BID BM   Continuous Infusions:     LOS: 4 days    Silvano Bilis, MD [facility]   If 7PM-7AM, please contact night-coverage [email or web address] Password [facility] 02/14/2024, 3:36 PM

## 2024-02-14 NOTE — Plan of Care (Signed)

## 2024-02-15 DIAGNOSIS — I5023 Acute on chronic systolic (congestive) heart failure: Secondary | ICD-10-CM | POA: Diagnosis not present

## 2024-02-15 DIAGNOSIS — Z515 Encounter for palliative care: Secondary | ICD-10-CM | POA: Diagnosis not present

## 2024-02-15 DIAGNOSIS — Z9889 Other specified postprocedural states: Secondary | ICD-10-CM | POA: Diagnosis not present

## 2024-02-15 DIAGNOSIS — J90 Pleural effusion, not elsewhere classified: Secondary | ICD-10-CM | POA: Diagnosis not present

## 2024-02-15 NOTE — Progress Notes (Signed)
 PROGRESS NOTE    Peter Becker  NFA:[id number] DOB: 1929/03/03 DOA: 02/10/2024 PCP: Peter Penton, MD  Outpatient Specialists: cardiology    Brief Narrative:   From admission h and p  Peter Becker is a 89 y.o. male with medical history significant of sCHF with EF 35-40%, HTN, HLD, DM, CAD, CABG, PVD, stroke, anxiety, CKD-3a, who presents with SOB.   Pt states that he has shortness of breath in the past several days, which has been progressively worsening.  Patient has mild dry cough, no chest pain, fever or chills.  Patient was found to have oxygen desaturation to 54% on room air, with acute respiratory distress, difficulty speaking in full sentence. Initially started on high flow nasal cannula oxygen, and then titrated down to 5 L oxygen with 94% of saturation.  Patient does not have nausea, vomiting, diarrhea or abdominal pain.  No symptoms of UTI.  No fever or chills.  Patient states that his blood sugar has been elevated in the past several weeks. He has tried to make an appointment with Endocrinologist but they "have to review his chart" before scheduling an appointment  Assessment & Plan:   Principal Problem:   Acute on chronic systolic CHF (congestive heart failure) (HCC) Active Problems:   Acute respiratory failure with hypoxia (HCC)   CAD (coronary artery disease)   Myocardial injury   Essential hypertension   Hyperlipidemia   Ischemic stroke (HCC)   Type II diabetes mellitus with renal manifestations (HCC)   Chronic kidney disease, stage 3a (HCC)   BPH (benign prostatic hyperplasia)   Abnormal LFTs   Protein-calorie malnutrition, severe (HCC)   PAD (peripheral artery disease) (HCC)   Status post thoracentesis   Acute on chronic congestive heart failure (HCC)  # End-of-life care Severe hfref, we have diuresed as much as kidneys will allow and remains on high flow nasal cannula, very debilitated, limited PO, for several days now. Prognosis is poor. Palliative engaged,  after family meeting today, decision to transition to comfort care. - comfort care orders placed - hospice evaluation today  # Hyperglycemia # T2DM Despite what h and p says, patient reports (and initial ED provider report) says patient presented with concern for hyperglycemia. A1c is in the 10s. Here intermittent hypoglycemia   # Hypoxia 2/2 decompensated chf, pleural effusion. Relatively asymptomatic but requiring high flow to maintain his sats. No dyspnea, low procal. Trops are int he 200s but no chest pain, no overt ischemic changes on EKG. Vq scan no PE - no dyspnea currently, has morphine for prn use  # biventricular HFrEF, decompensated Bnp 1600s, hypoxic, cxr with pulm edema, also mod left pleural effusion. EF 25-30 which is a decline from last check, also RV dysfunction. Concern for amyloid heart disease - coreg held given decompensated chf - diuresed with IV lasix, paused 3/14 for up-trending creatinine - not a candidate for additional therapeutics per cardiology  # Pleural effusion Likely 2/2 chf. diagnostic/therapeutic thoracentesis performed on 3/12, out 900 ml. Consistent with transudate likely from decompensated chf.  # BPH  # Dysphagia Dysphagia 2 diet  # HTN  # CAD # PAD Remote history cabg. Asymptomatic  # ckd 3a Cr 1.7 bit above baseline of 1.2-1.3, likely some degree of cardiorenal vs diuretic effect  DVT prophylaxis: none Code Status: dnr- comfort Family Communication: son who is hcpoa updated telephonically 3/16  Level of care: Med-Surg Status is: Inpatient Remains inpatient appropriate because: dispo planning underway    Consultants:  Advanced  heart failure team  Procedures: Thoracentesis 3/12  Antimicrobials:  none    Subjective: Breathing stable, no pain or other complaints.    Objective: Vitals:   02/14/24 2115 02/15/24 0034 02/15/24 0346 02/15/24 0835  BP:  111/62 107/62 102/64  Pulse:  90 87 92  Resp:  20 18 20   Temp:  98 F  (36.7 C) 97.8 F (36.6 C) 97.6 F (36.4 C)  TempSrc:  Oral Oral   SpO2: 92% 91% 96% (!) 88%  Weight:        Intake/Output Summary (Last 24 hours) at 02/15/2024 1058 Last data filed at 02/15/2024 0346 Gross per 24 hour  Intake 340 ml  Output 1250 ml  Net -910 ml   Filed Weights   02/13/24 0514 02/14/24 0513  Weight: 57.1 kg 49.9 kg    Examination:  General exam: Appears calm and comfortable, chronically ill appearing Respiratory system: decreased breat sounds at bases, scattered chronchi Cardiovascular system: S1 & S2 heard, RR, distant heart sounds Gastrointestinal system: Abdomen is nondistended, soft and nontender.   Central nervous system: Alert and oriented to self, able to move all 4 equally Extremities: Symmetric 5 x 5 power. Trace LE edema Skin: No rashes, lesions or ulcers Psychiatry: calm    Data Reviewed: I have personally reviewed following labs and imaging studies  CBC: Recent Labs  Lab 02/08/24 1923 02/10/24 1854 02/11/24 0458 02/12/24 0430 02/13/24 0504  WBC 6.2 6.2 7.5 8.0 7.2  NEUTROABS  --  4.8  --   --   --   HGB 13.0 13.5 13.0 12.9* 12.0*  HCT 41.1 42.3 40.5 40.7 37.7*  MCV 105.4* 103.4* 104.4* 105.2* 105.9*  PLT 180 184 188 164 157   Basic Metabolic Panel: Recent Labs  Lab 02/10/24 1854 02/11/24 0458 02/11/24 0855 02/12/24 0430 02/13/24 0504 02/14/24 0248  NA 139 142  --  142 138 136  K 5.1 5.6* 4.3 4.5 4.2 4.1  CL 102 108  --  106 98 99  CO2 27 27  --  26 29 29   GLUCOSE 345* 71  --  50* 135* 229*  BUN 38* 40*  --  43* 53* 59*  CREATININE 1.50* 1.52*  --  1.65* 1.74* 1.61*  CALCIUM 8.8* 8.3*  --  8.1* 8.3* 8.0*  MG  --  2.3  --   --   --   --    GFR: Estimated Creatinine Clearance: 20.2 mL/min (A) (by C-G formula based on SCr of 1.61 mg/dL (H)). Liver Function Tests: Recent Labs  Lab 02/10/24 1854  AST 84*  ALT 65*  ALKPHOS 89  BILITOT 0.8  PROT 5.9*  ALBUMIN 3.7   No results for input(s): "LIPASE", "AMYLASE" in the  last 168 hours. No results for input(s): "AMMONIA" in the last 168 hours. Coagulation Profile: No results for input(s): "INR", "PROTIME" in the last 168 hours. Cardiac Enzymes: No results for input(s): "CKTOTAL", "CKMB", "CKMBINDEX", "TROPONINI" in the last 168 hours. BNP (last 3 results) No results for input(s): "PROBNP" in the last 8760 hours. HbA1C: No results for input(s): "HGBA1C" in the last 72 hours.  CBG: Recent Labs  Lab 02/13/24 2358 02/14/24 0431 02/14/24 0814 02/14/24 0817 02/14/24 1118  GLUCAP 244* 252* >600* 208* 215*   Lipid Profile: No results for input(s): "CHOL", "HDL", "LDLCALC", "TRIG", "CHOLHDL", "LDLDIRECT" in the last 72 hours.  Thyroid Function Tests: No results for input(s): "TSH", "T4TOTAL", "FREET4", "T3FREE", "THYROIDAB" in the last 72 hours. Anemia Panel: No results for input(s): "VITAMINB12", "  FOLATE", "FERRITIN", "TIBC", "IRON", "RETICCTPCT" in the last 72 hours. Urine analysis:    Component Value Date/Time   COLORURINE YELLOW (A) 02/08/2024 2025   APPEARANCEUR CLEAR (A) 02/08/2024 2025   LABSPEC 1.026 02/08/2024 2025   PHURINE 5.0 02/08/2024 2025   GLUCOSEU >=500 (A) 02/08/2024 2025   HGBUR NEGATIVE 02/08/2024 2025   BILIRUBINUR NEGATIVE 02/08/2024 2025   KETONESUR NEGATIVE 02/08/2024 2025   PROTEINUR 100 (A) 02/08/2024 2025   NITRITE NEGATIVE 02/08/2024 2025   LEUKOCYTESUR NEGATIVE 02/08/2024 2025   Sepsis Labs: @LABRCNTIP (procalcitonin:4,lacticidven:4)  ) Recent Results (from the past 240 hours)  Resp panel by RT-PCR (RSV, Flu A&B, Covid) Anterior Nasal Swab     Status: None   Collection Time: 02/10/24  6:54 PM   Specimen: Anterior Nasal Swab  Result Value Ref Range Status   SARS Coronavirus 2 by RT PCR NEGATIVE NEGATIVE Final    Comment: (NOTE) SARS-CoV-2 target nucleic acids are NOT DETECTED.  The SARS-CoV-2 RNA is generally detectable in upper respiratory specimens during the acute phase of infection. The  lowest concentration of SARS-CoV-2 viral copies this assay can detect is 138 copies/mL. A negative result does not preclude SARS-Cov-2 infection and should not be used as the sole basis for treatment or other patient management decisions. A negative result may occur with  improper specimen collection/handling, submission of specimen other than nasopharyngeal swab, presence of viral mutation(s) within the areas targeted by this assay, and inadequate number of viral copies(<138 copies/mL). A negative result must be combined with clinical observations, patient history, and epidemiological information. The expected result is Negative.  Fact Sheet for Patients:  BloggerCourse.com  Fact Sheet for Healthcare Providers:  SeriousBroker.it  This test is no t yet approved or cleared by the [place] FDA and  has been authorized for detection and/or diagnosis of SARS-CoV-2 by FDA under an Emergency Use Authorization (EUA). This EUA will remain  in effect (meaning this test can be used) for the duration of the COVID-19 declaration under Section 564(b)(1) of the Act, 21 U.S.C.section 360bbb-3(b)(1), unless the authorization is terminated  or revoked sooner.       Influenza A by PCR NEGATIVE NEGATIVE Final   Influenza B by PCR NEGATIVE NEGATIVE Final    Comment: (NOTE) The Xpert Xpress SARS-CoV-2/FLU/RSV plus assay is intended as an aid in the diagnosis of influenza from Nasopharyngeal swab specimens and should not be used as a sole basis for treatment. Nasal washings and aspirates are unacceptable for Xpert Xpress SARS-CoV-2/FLU/RSV testing.  Fact Sheet for Patients: BloggerCourse.com  Fact Sheet for Healthcare Providers: SeriousBroker.it  This test is not yet approved or cleared by the [place] FDA and has been authorized for detection and/or diagnosis of SARS-CoV-2 by FDA under  an Emergency Use Authorization (EUA). This EUA will remain in effect (meaning this test can be used) for the duration of the COVID-19 declaration under Section 564(b)(1) of the Act, 21 U.S.C. section 360bbb-3(b)(1), unless the authorization is terminated or revoked.     Resp Syncytial Virus by PCR NEGATIVE NEGATIVE Final    Comment: (NOTE) Fact Sheet for Patients: BloggerCourse.com  Fact Sheet for Healthcare Providers: SeriousBroker.it  This test is not yet approved or cleared by the [place] FDA and has been authorized for detection and/or diagnosis of SARS-CoV-2 by FDA under an Emergency Use Authorization (EUA). This EUA will remain in effect (meaning this test can be used) for the duration of the COVID-19 declaration under Section 564(b)(1) of the Act, 21 U.S.C. section  360bbb-3(b)(1), unless the authorization is terminated or revoked.  Performed at [facility], [address] [place], [place] [address]          Radiology Studies: No results found.       Scheduled Meds:  feeding supplement  237 mL Oral BID BM   Continuous Infusions:     LOS: 5 days    Silvano Bilis, MD [facility]   If 7PM-7AM, please contact night-coverage [email or web address] Password [facility] 02/15/2024, 10:58 AM

## 2024-02-15 NOTE — Plan of Care (Signed)
  Problem: Education: Goal: Ability to describe self-care measures that may prevent or decrease complications (Diabetes Survival Skills Education) will improve Outcome: Progressing   Problem: Coping: Goal: Ability to adjust to condition or change in health will improve Outcome: Progressing   Problem: Health Behavior/Discharge Planning: Goal: Ability to identify and utilize available resources and services will improve Outcome: Progressing Goal: Ability to manage health-related needs will improve Outcome: Progressing   Problem: Nutritional: Goal: Maintenance of adequate nutrition will improve Outcome: Progressing Goal: Progress toward achieving an optimal weight will improve Outcome: Progressing   Problem: Health Behavior/Discharge Planning: Goal: Ability to manage health-related needs will improve Outcome: Progressing   Problem: Clinical Measurements: Goal: Respiratory complications will improve Outcome: Progressing

## 2024-02-15 NOTE — TOC Progression Note (Signed)
 Transition of Care [facility]) - Progression Note    Patient Details  Name: LONN IM MRN: [id number] Date of Birth: 1929/01/01  [facility] CM/SW Contact  Bing Quarry, RN Phone Number: 02/15/2024, 11:28 AM  Clinical Narrative:  3/16: Sherron Monday with HCPOA, son Marcial Pacas, regarding choice for hospice, and they wanted to speak to onsite liaison via [facility]. Reached out to liaison for referral.    Gabriel Cirri MSN RN CM  RN Case Manager [place]  Transitions of Care Direct Dial: [phone] (Weekends Only) [facility] Main Office Phone: [phone] [facility] Fax: [phone] [email or web address]          Expected Discharge Plan and Services                                               Social Determinants of Health (SDOH) Interventions SDOH Screenings   Food Insecurity: No Food Insecurity (02/12/2024)  Housing: Low Risk  (02/13/2024)  Transportation Needs: No Transportation Needs (02/13/2024)  Utilities: Not At Risk (02/13/2024)  Financial Resource Strain: Low Risk  (09/02/2023)   Received from [facility]  Social Connections: Unknown (02/13/2024)  Tobacco Use: Medium Risk (02/10/2024)    Readmission Risk Interventions     No data to display

## 2024-02-15 NOTE — Progress Notes (Signed)
                                                                                                                                                                                                           Daily Progress Note   Patient Name: Peter Becker       Date: 02/15/2024 DOB: [birth year]  Age: 89 y.o. MRN#: [id number] Attending Physician: Kathrynn Running, MD Primary Care Physician: Danella Penton, MD Admit Date: 02/10/2024  Reason for Consultation/Follow-up: Establishing goals of care  HPI/Brief Hospital Review: 89 y.o. male  with past medical history of CHF with EF 35-40%, HTN, DM2, CAD s/p CABG x3 in 2007, PVD, CVA in 2018 and CKD stage 3 admitted from home on 02/10/2024 with concern for hyperglycemia. On arrival to ED found to significantly hypoxic with sats in 50's. Found to have elevated BNP at 1652.9 and CXR revealing pulmonary edema and bilateral pleural effusions (L>R).   Has remained on HHFNC at significant Fi02 levels Underwent left thoracentesis 3/12 with removal of 900 cc    Palliative medicine was consulted for assisting with goals of care conversations.  Transitioned to [facility] 3/15.  Subjective: Extensive chart review has been completed prior to meeting patient including labs, vital signs, imaging, progress notes, orders, and available advanced directive documents from current and previous encounters.    Visited with Ms. Peter Becker at his bedside. He is resting in bed, denies acute pain or discomfort, tolerating wean from [facility] to [facility]. Symptom burden remains low. No recommendations to adjust comfort medications at this time.  Spoke with daughter at bedside who expresses interest in taking Mr. Peter Becker back to her home with hospice services following. TOC and HL engage by primary team.  Per chart review, Peter Becker to d/c home with hospice services following once DME equipment delivered, likely early next week.  PMT to step away from daily visits as goals/plans are set. Please  reengage as needs arise.  Thank you for allowing the Palliative Medicine Team to assist in the care of this patient.  Total time:  25 minutes  Time spent includes: Detailed review of medical records (labs, imaging, vital signs), medically appropriate exam (mental status, respiratory, cardiac, skin), discussed with treatment team, counseling and educating patient, family and staff, documenting clinical information, medication management and coordination of care.  Peter Deed, DNP, AGNP-C Palliative Medicine   Please contact Palliative Medicine Team phone at [phone] for questions and concerns.

## 2024-02-15 NOTE — Progress Notes (Signed)
 [facility] LIAISON NOTE   Received request from Noni Saupe, RN, Transitions of Care Manager, for hospice services at home after discharge. Spoke with Pamelia Hoit, patient's daughter,  to initiate education related to hospice philosophy, services, and team approach to care. Patient/family verbalized understanding of information given. Per discussion, the plan is for discharge home by EMS when medically stable and DME can be ordered, delivered and set up.  DME needs discussed.  Patient has the following equipment in the home:  hospital bed with over bed table, BSC, shower chair and 2 wheeled walker.  Patient/family requests the following equipment for delivery:  Oxygen 2L/[place]               The address has been verified and is correct in the chart. Pamelia Hoit, is the family contact to arrange time of equipment delivery.   Please send signed and completed DNR home with patient/family if applicable.   Please provide prescriptions at discharge as needed to ensure ongoing symptom management.   [facility] information and contact numbers given to [employer].  Above information shared with Noni Saupe, RN, Transitions of Care Manager and hospital medical care team.   Please call with any hospice related questions or concerns.  Thank you for the opportunity to participate in this patient's care.   Norris Cross, MA, BSN, RN, FNE Nurse Liaison [phone]

## 2024-02-16 DIAGNOSIS — I5023 Acute on chronic systolic (congestive) heart failure: Secondary | ICD-10-CM | POA: Diagnosis not present

## 2024-02-16 LAB — MULTIPLE MYELOMA PANEL, SERUM
Albumin SerPl Elph-Mcnc: 2.9 g/dL (ref 2.9–4.4)
Albumin/Glob SerPl: 1.5 (ref 0.7–1.7)
Alpha 1: 0.2 g/dL (ref 0.0–0.4)
Alpha2 Glob SerPl Elph-Mcnc: 0.7 g/dL (ref 0.4–1.0)
B-Globulin SerPl Elph-Mcnc: 0.7 g/dL (ref 0.7–1.3)
Gamma Glob SerPl Elph-Mcnc: 0.3 g/dL — ABNORMAL LOW (ref 0.4–1.8)
Globulin, Total: 2 g/dL — ABNORMAL LOW (ref 2.2–3.9)
IgA: 100 mg/dL (ref 61–437)
IgG (Immunoglobin G), Serum: 354 mg/dL — ABNORMAL LOW (ref 603–1613)
IgM (Immunoglobulin M), Srm: 9 mg/dL — ABNORMAL LOW (ref 15–143)
Total Protein ELP: 4.9 g/dL — ABNORMAL LOW (ref 6.0–8.5)

## 2024-02-16 NOTE — Progress Notes (Signed)
 Nutrition Brief Note  Chart reviewed. Pt now transitioning to comfort care. Per palliative care notes, plan to discharge home with hospice services, likely this week once necessary equipment is delivered to his home.  No further nutrition interventions planned at this time.  Please re-consult as needed.   Levada Schilling, RD, LDN, CDCES Registered Dietitian III Certified Diabetes Care and Education Specialist If unable to reach this RD, please use "RD Inpatient" group chat on secure chat between hours of 8am-4 pm daily

## 2024-02-16 NOTE — Progress Notes (Signed)
 [facility] Liaison Note  Patient to discharge home tomorrow via family car.  Transport wheelchair ordered per the request of the patient's daughter.  Asked that it be delivered this afternoon or in the am.  Paitent's home 02 with portable tanks was delivered to the home today.    Please call with any Hospice related questions or concerns.    Thank you for the opportunity to participate in this patient's care  [facility] Liaison 336 [phone]

## 2024-02-16 NOTE — TOC Progression Note (Addendum)
 [facility]) - Progression Note    Patient Details  Name: Peter Becker MRN: [id number] Date of Birth: 1929-10-18  [facility]) CM/SW Contact  Truddie Hidden, RN Phone Number: 02/16/2024, 11:22 AM  Clinical Narrative:    Attempt to reach [facility] from [place]. No answer. Left a message.   2:44pm Patient has been confirmed to discharge home with hospice via [facility]. A WC and home oxygen has been delivered. Spoke with patient's daughter, Lafonda Mosses. She will transport patient and was advised to bring patient's home oxygen for transport. [facility] will see patient Wednesday. Lafonda Mosses is aware.          Expected Discharge Plan and Services                                               Social Determinants of Health (SDOH) Interventions SDOH Screenings   Food Insecurity: No Food Insecurity (02/12/2024)  Housing: Low Risk  (02/13/2024)  Transportation Needs: No Transportation Needs (02/13/2024)  Utilities: Not At Risk (02/13/2024)  Financial Resource Strain: Low Risk  (09/02/2023)   Received from [facility]  Social Connections: Unknown (02/13/2024)  Tobacco Use: Medium Risk (02/10/2024)    Readmission Risk Interventions     No data to display

## 2024-02-16 NOTE — Progress Notes (Signed)
 PROGRESS NOTE    Peter Becker  OZD:[id number] DOB: March 04, 1929 DOA: 02/10/2024 PCP: Danella Penton, MD  Outpatient Specialists: cardiology    Brief Narrative:   From admission h and p  Peter Becker is a 89 y.o. male with medical history significant of sCHF with EF 35-40%, HTN, HLD, DM, CAD, CABG, PVD, stroke, anxiety, CKD-3a, who presents with SOB.   Pt states that he has shortness of breath in the past several days, which has been progressively worsening.  Patient has mild dry cough, no chest pain, fever or chills.  Patient was found to have oxygen desaturation to 54% on room air, with acute respiratory distress, difficulty speaking in full sentence. Initially started on high flow nasal cannula oxygen, and then titrated down to 5 L oxygen with 94% of saturation.  Patient does not have nausea, vomiting, diarrhea or abdominal pain.  No symptoms of UTI.  No fever or chills.  Patient states that his blood sugar has been elevated in the past several weeks. He has tried to make an appointment with Endocrinologist but they "have to review his chart" before scheduling an appointment  Assessment & Plan:   Principal Problem:   Acute on chronic systolic CHF (congestive heart failure) (HCC) Active Problems:   Acute respiratory failure with hypoxia (HCC)   CAD (coronary artery disease)   Myocardial injury   Essential hypertension   Hyperlipidemia   Ischemic stroke (HCC)   Type II diabetes mellitus with renal manifestations (HCC)   Chronic kidney disease, stage 3a (HCC)   BPH (benign prostatic hyperplasia)   Abnormal LFTs   Protein-calorie malnutrition, severe (HCC)   PAD (peripheral artery disease) (HCC)   Status post thoracentesis   Acute on chronic congestive heart failure (HCC)  # End-of-life care Severe hfref, we have diuresed as much as kidneys will allow and remains on high flow nasal cannula, very debilitated, limited PO, for several days now. Prognosis is poor. Palliative engaged,  after family meeting today, decision to transition to comfort care. - comfort care orders placed - plan to d/c home tomorrow with hospice, daughter needs today to get things set up  # Hyperglycemia # T2DM Despite what h and p says, patient reports (and initial ED provider report) says patient presented with concern for hyperglycemia. A1c is in the 10s. Here intermittent hypoglycemia   # Hypoxia 2/2 decompensated chf, pleural effusion. Relatively asymptomatic but requiring high flow to maintain his sats. No dyspnea, low procal. Trops are int he 200s but no chest pain, no overt ischemic changes on EKG. Vq scan no PE - no dyspnea currently, has morphine for prn use  # biventricular HFrEF, decompensated Bnp 1600s, hypoxic, cxr with pulm edema, also mod left pleural effusion. EF 25-30 which is a decline from last check, also RV dysfunction. Concern for amyloid heart disease - coreg held given decompensated chf - diuresed with IV lasix, paused 3/14 for up-trending creatinine - not a candidate for additional therapeutics per cardiology  # Pleural effusion Likely 2/2 chf. diagnostic/therapeutic thoracentesis performed on 3/12, out 900 ml. Consistent with transudate likely from decompensated chf.  # BPH  # Dysphagia Dysphagia 2 diet  # HTN  # CAD # PAD Remote history cabg. Asymptomatic  # ckd 3a Cr 1.7 bit above baseline of 1.2-1.3, likely some degree of cardiorenal vs diuretic effect  DVT prophylaxis: none Code Status: dnr- comfort Family Communication: son who is hcpoa updated telephonically 3/17  Level of care: Med-Surg Status is: Inpatient Remains  inpatient appropriate because: dispo planning underway    Consultants:  Advanced heart failure team  Procedures: Thoracentesis 3/12  Antimicrobials:  none    Subjective: Breathing stable, no pain or other complaints. Tolerated some lunch  Objective: Vitals:   02/14/24 2115 02/15/24 0034 02/15/24 0346 02/15/24 0835   BP:  111/62 107/62 102/64  Pulse:  90 87 92  Resp:  20 18 20   Temp:  98 F (36.7 C) 97.8 F (36.6 C) 97.6 F (36.4 C)  TempSrc:  Oral Oral   SpO2: 92% 91% 96% (!) 88%  Weight:        Intake/Output Summary (Last 24 hours) at 02/16/2024 1405 Last data filed at 02/16/2024 1018 Gross per 24 hour  Intake --  Output 900 ml  Net -900 ml   Filed Weights   02/13/24 0514 02/14/24 0513  Weight: 57.1 kg 49.9 kg    Examination:  General exam: Appears calm and comfortable, chronically ill appearing Respiratory system: decreased breat sounds at bases, scattered chronchi Cardiovascular system: S1 & S2 heard, RR, distant heart sounds Gastrointestinal system: Abdomen is nondistended, soft and nontender.   Central nervous system: Alert and oriented to self, able to move all 4 equally Extremities: Symmetric 5 x 5 power. Trace LE edema Skin: No rashes, lesions or ulcers Psychiatry: calm    Data Reviewed: I have personally reviewed following labs and imaging studies  CBC: Recent Labs  Lab 02/10/24 1854 02/11/24 0458 02/12/24 0430 02/13/24 0504  WBC 6.2 7.5 8.0 7.2  NEUTROABS 4.8  --   --   --   HGB 13.5 13.0 12.9* 12.0*  HCT 42.3 40.5 40.7 37.7*  MCV 103.4* 104.4* 105.2* 105.9*  PLT 184 188 164 157   Basic Metabolic Panel: Recent Labs  Lab 02/10/24 1854 02/11/24 0458 02/11/24 0855 02/12/24 0430 02/13/24 0504 02/14/24 0248  NA 139 142  --  142 138 136  K 5.1 5.6* 4.3 4.5 4.2 4.1  CL 102 108  --  106 98 99  CO2 27 27  --  26 29 29   GLUCOSE 345* 71  --  50* 135* 229*  BUN 38* 40*  --  43* 53* 59*  CREATININE 1.50* 1.52*  --  1.65* 1.74* 1.61*  CALCIUM 8.8* 8.3*  --  8.1* 8.3* 8.0*  MG  --  2.3  --   --   --   --    GFR: Estimated Creatinine Clearance: 20.2 mL/min (A) (by C-G formula based on SCr of 1.61 mg/dL (H)). Liver Function Tests: Recent Labs  Lab 02/10/24 1854  AST 84*  ALT 65*  ALKPHOS 89  BILITOT 0.8  PROT 5.9*  ALBUMIN 3.7   No results for  input(s): "LIPASE", "AMYLASE" in the last 168 hours. No results for input(s): "AMMONIA" in the last 168 hours. Coagulation Profile: No results for input(s): "INR", "PROTIME" in the last 168 hours. Cardiac Enzymes: No results for input(s): "CKTOTAL", "CKMB", "CKMBINDEX", "TROPONINI" in the last 168 hours. BNP (last 3 results) No results for input(s): "PROBNP" in the last 8760 hours. HbA1C: No results for input(s): "HGBA1C" in the last 72 hours.  CBG: Recent Labs  Lab 02/13/24 2358 02/14/24 0431 02/14/24 0814 02/14/24 0817 02/14/24 1118  GLUCAP 244* 252* >600* 208* 215*   Lipid Profile: No results for input(s): "CHOL", "HDL", "LDLCALC", "TRIG", "CHOLHDL", "LDLDIRECT" in the last 72 hours.  Thyroid Function Tests: No results for input(s): "TSH", "T4TOTAL", "FREET4", "T3FREE", "THYROIDAB" in the last 72 hours. Anemia Panel: No results for  input(s): "VITAMINB12", "FOLATE", "FERRITIN", "TIBC", "IRON", "RETICCTPCT" in the last 72 hours. Urine analysis:    Component Value Date/Time   COLORURINE YELLOW (A) 02/08/2024 2025   APPEARANCEUR CLEAR (A) 02/08/2024 2025   LABSPEC 1.026 02/08/2024 2025   PHURINE 5.0 02/08/2024 2025   GLUCOSEU >=500 (A) 02/08/2024 2025   HGBUR NEGATIVE 02/08/2024 2025   BILIRUBINUR NEGATIVE 02/08/2024 2025   KETONESUR NEGATIVE 02/08/2024 2025   PROTEINUR 100 (A) 02/08/2024 2025   NITRITE NEGATIVE 02/08/2024 2025   LEUKOCYTESUR NEGATIVE 02/08/2024 2025   Sepsis Labs: @LABRCNTIP (procalcitonin:4,lacticidven:4)  ) Recent Results (from the past 240 hours)  Resp panel by RT-PCR (RSV, Flu A&B, Covid) Anterior Nasal Swab     Status: None   Collection Time: 02/10/24  6:54 PM   Specimen: Anterior Nasal Swab  Result Value Ref Range Status   SARS Coronavirus 2 by RT PCR NEGATIVE NEGATIVE Final    Comment: (NOTE) SARS-CoV-2 target nucleic acids are NOT DETECTED.  The SARS-CoV-2 RNA is generally detectable in upper respiratory specimens during the acute phase  of infection. The lowest concentration of SARS-CoV-2 viral copies this assay can detect is 138 copies/mL. A negative result does not preclude SARS-Cov-2 infection and should not be used as the sole basis for treatment or other patient management decisions. A negative result may occur with  improper specimen collection/handling, submission of specimen other than nasopharyngeal swab, presence of viral mutation(s) within the areas targeted by this assay, and inadequate number of viral copies(<138 copies/mL). A negative result must be combined with clinical observations, patient history, and epidemiological information. The expected result is Negative.  Fact Sheet for Patients:  BloggerCourse.com  Fact Sheet for Healthcare Providers:  SeriousBroker.it  This test is no t yet approved or cleared by the [place] FDA and  has been authorized for detection and/or diagnosis of SARS-CoV-2 by FDA under an Emergency Use Authorization (EUA). This EUA will remain  in effect (meaning this test can be used) for the duration of the COVID-19 declaration under Section 564(b)(1) of the Act, 21 U.S.C.section 360bbb-3(b)(1), unless the authorization is terminated  or revoked sooner.       Influenza A by PCR NEGATIVE NEGATIVE Final   Influenza B by PCR NEGATIVE NEGATIVE Final    Comment: (NOTE) The Xpert Xpress SARS-CoV-2/FLU/RSV plus assay is intended as an aid in the diagnosis of influenza from Nasopharyngeal swab specimens and should not be used as a sole basis for treatment. Nasal washings and aspirates are unacceptable for Xpert Xpress SARS-CoV-2/FLU/RSV testing.  Fact Sheet for Patients: BloggerCourse.com  Fact Sheet for Healthcare Providers: SeriousBroker.it  This test is not yet approved or cleared by the [place] FDA and has been authorized for detection and/or diagnosis of  SARS-CoV-2 by FDA under an Emergency Use Authorization (EUA). This EUA will remain in effect (meaning this test can be used) for the duration of the COVID-19 declaration under Section 564(b)(1) of the Act, 21 U.S.C. section 360bbb-3(b)(1), unless the authorization is terminated or revoked.     Resp Syncytial Virus by PCR NEGATIVE NEGATIVE Final    Comment: (NOTE) Fact Sheet for Patients: BloggerCourse.com  Fact Sheet for Healthcare Providers: SeriousBroker.it  This test is not yet approved or cleared by the [place] FDA and has been authorized for detection and/or diagnosis of SARS-CoV-2 by FDA under an Emergency Use Authorization (EUA). This EUA will remain in effect (meaning this test can be used) for the duration of the COVID-19 declaration under Section 564(b)(1) of the Act, 21  U.S.C. section 360bbb-3(b)(1), unless the authorization is terminated or revoked.  Performed at [facility], [address], [place], [place] [address]          Radiology Studies: No results found.       Scheduled Meds:  feeding supplement  237 mL Oral BID BM   Continuous Infusions:     LOS: 6 days    Silvano Bilis, MD [facility]   If 7PM-7AM, please contact night-coverage [email or web address] Password [email or web address] 02/16/2024, 2:05 PM

## 2024-02-17 DIAGNOSIS — I5023 Acute on chronic systolic (congestive) heart failure: Secondary | ICD-10-CM | POA: Diagnosis not present

## 2024-02-17 LAB — IMMUNOFIXATION, URINE

## 2024-02-17 MED ORDER — MORPHINE SULFATE (CONCENTRATE) 10 MG /0.5 ML PO SOLN: 5.0000 mg | ORAL | 0 refills | Status: DC | PRN

## 2024-02-17 NOTE — Discharge Summary (Signed)
 Peter Becker BJY:[id number] DOB: [birth year] DOA: 02/10/2024  PCP: Danella Penton, MD  Admit date: 02/10/2024 Discharge date: 02/17/2024  Time spent: 35 minutes     Discharge Diagnoses:  Principal Problem:   Acute on chronic systolic CHF (congestive heart failure) (HCC) Active Problems:   Acute respiratory failure with hypoxia (HCC)   CAD (coronary artery disease)   Myocardial injury   Essential hypertension   Hyperlipidemia   Ischemic stroke (HCC)   Type II diabetes mellitus with renal manifestations (HCC)   Chronic kidney disease, stage 3a (HCC)   BPH (benign prostatic hyperplasia)   Abnormal LFTs   Protein-calorie malnutrition, severe (HCC)   PAD (peripheral artery disease) (HCC)   Status post thoracentesis   Acute on chronic congestive heart failure (HCC)   Discharge Condition: stable  Diet recommendation: ad lib  Filed Weights   02/13/24 0514 02/14/24 0513  Weight: 57.1 kg 49.9 kg    History of present illness:  From admission h and p Peter Becker is a 89 y.o. male with medical history significant of sCHF with EF 35-40%, HTN, HLD, DM, CAD, CABG, PVD, stroke, anxiety, CKD-3a, who presents with SOB.   Pt states that he has shortness of breath in the past several days, which has been progressively worsening.  Patient has mild dry cough, no chest pain, fever or chills.  Patient was found to have oxygen desaturation to 54% on room air, with acute respiratory distress, difficulty speaking in full sentence. Initially started on high flow nasal cannula oxygen, and then titrated down to 5 L oxygen with 94% of saturation.  Patient does not have nausea, vomiting, diarrhea or abdominal pain.  No symptoms of UTI.  No fever or chills.  Patient states that his blood sugar has been elevated in the past several weeks. He has tried to make an appointment with Endocrinologist but they "have to review his chart" before scheduling an appointment  Hospital Course:   # End-of-life  care Severe hfref, we have diuresed as much as kidneys will allow and remains on high flow nasal cannula, very debilitated, limited PO, for several days now. Prognosis is poor. Palliative engaged, after family meeting on 3/15, decision to transition to full comfort care. - not currently requiring prns for comfort - home with home hospice, morphine prn if develops respiratory distress   # Hyperglycemia # T2DM Despite what h and p says, patient reports (and initial ED provider report) says patient presented with concern for hyperglycemia. A1c is in the 10s. Here intermittent hypoglycemia, all home meds now discontinued   # Hypoxia 2/2 decompensated chf, pleural effusion. Relatively asymptomatic but requiring high flow to maintain his sats. No dyspnea, low procal. Trops are int he 200s but no chest pain, no overt ischemic changes on EKG. Vq scan no PE   # biventricular HFrEF, decompensated Bnp 1600s, hypoxic, cxr with pulm edema, also mod left pleural effusion. EF 25-30 which is a decline from last check, also RV dysfunction. Concern for amyloid heart disease - coreg held given decompensated chf - diuresed with IV lasix, paused 3/14 for up-trending creatinine - not a candidate for additional therapeutics per cardiology   # Pleural effusion Likely 2/2 chf. diagnostic/therapeutic thoracentesis performed on 3/12, out 900 ml. Consistent with transudate likely from decompensated chf.   # BPH   # Dysphagia Dysphagia 2 diet   # HTN   # CAD # PAD Remote history cabg. Asymptomatic   # ckd 3a Cr 1.7 bit above baseline  of 1.2-1.3, likely some degree of cardiorenal vs diuretic effect  Procedures: thoracentesis   Consultations: Cardiology, palliative  Discharge Exam: Vitals:   02/16/24 1628 02/17/24 0057  BP: 130/70 137/77  Pulse: 93 89  Resp: 16 16  Temp:  98.1 F (36.7 C)  SpO2: (!) 73% 91%    General exam: Appears calm and comfortable, chronically ill appearing Respiratory  system: decreased breat sounds at bases, scattered chronchi Cardiovascular system: S1 & S2 heard, RR, distant heart sounds Gastrointestinal system: Abdomen is nondistended, soft and nontender.   Central nervous system: Alert and oriented to self, able to move all 4 equally Extremities: Symmetric 5 x 5 power. Trace LE edema Skin: No rashes, lesions or ulcers Psychiatry: calm  Discharge Instructions   Discharge Instructions     Diet general   Complete by: As directed    Dysphagia diet   Increase activity slowly   Complete by: As directed       Allergies as of 02/17/2024       Reactions   Tape Other (See Comments)   SKIN IS VERY THIN AND TEARS AND BRUISES EASILY; Please use an alternative!!        Medication List     STOP taking these medications    acetaminophen 325 MG tablet Commonly known as: TYLENOL   aspirin EC 81 MG tablet   atorvastatin 40 MG tablet Commonly known as: LIPITOR   carvedilol 3.125 MG tablet Commonly known as: COREG   cyanocobalamin 1000 MCG/ML injection Commonly known as: VITAMIN B12   dapagliflozin propanediol 10 MG Tabs tablet Commonly known as: Farxiga   fluticasone 50 MCG/ACT nasal spray Commonly known as: FLONASE   furosemide 20 MG tablet Commonly known as: LASIX   glimepiride 4 MG tablet Commonly known as: AMARYL   glipiZIDE 10 MG 24 hr tablet Commonly known as: GLUCOTROL XL   losartan 25 MG tablet Commonly known as: COZAAR   pioglitazone 15 MG tablet Commonly known as: ACTOS   silver sulfADIAZINE 1 % cream Commonly known as: SILVADENE   tamsulosin 0.4 MG Caps capsule Commonly known as: FLOMAX       TAKE these medications    morphine CONCENTRATE 10 mg / 0.5 ml concentrated solution Take 0.25 mLs (5 mg total) by mouth every 2 (two) hours as needed for moderate pain (pain score 4-6) (or dyspnea).       Allergies  Allergen Reactions   Tape Other (See Comments)    SKIN IS VERY THIN AND TEARS AND BRUISES  EASILY; Please use an alternative!!      The results of significant diagnostics from this hospitalization (including imaging, microbiology, ancillary and laboratory) are listed below for reference.    Significant Diagnostic Studies: NM Pulmonary Perfusion Result Date: 02/12/2024 CLINICAL DATA:  Pulmonary embolism (PE) suspected, low to intermediate prob, positive D-dimer hypoxia, elevated dimer EXAM: NUCLEAR MEDICINE PERFUSION LUNG SCAN TECHNIQUE: Perfusion images were obtained in multiple projections after intravenous injection of radiopharmaceutical. Ventilation scans intentionally deferred if perfusion scan and chest x-ray adequate for interpretation during COVID 19 epidemic. RADIOPHARMACEUTICALS:  4.34 mCi Tc-69m MAA IV COMPARISON:  Chest x-ray 02/11/2024 FINDINGS: Heterogeneous distribution of radiotracer within the bilateral lung fields. Matched perfusion defects of the left mid to lower lung field. No segmental perfusion defect within the right lung. IMPRESSION: Low probability for pulmonary embolism. Electronically Signed   By: Duanne Guess D.O.   On: 02/12/2024 15:57   ECHOCARDIOGRAM COMPLETE Result Date: 02/11/2024    ECHOCARDIOGRAM REPORT  Patient Name:   GRAEME MENEES Date of Exam: 02/11/2024 Medical Rec #:  [id number]     Height:       68.0 in Accession #:    [id number]    Weight:       115.0 lb Date of Birth:  1929/11/15     BSA:          1.615 m Patient Age:    89 years      BP:           133/82 mmHg Patient Gender: M             HR:           84 bpm. Exam Location:  [facility] Procedure: 2D Echo, Cardiac Doppler, Color Doppler and Strain Analysis (Both            Spectral and Color Flow Doppler were utilized during procedure). Indications:     CHF  History:         Patient has prior history of Echocardiogram examinations, most                  recent 02/09/2023. CHF, Angina, CAD and Previous Myocardial                  Infarction, Prior CABG, PAD and COPD, Signs/Symptoms:Shortness                   of Breath; Risk Factors:Hypertension, Diabetes and                  Dyslipidemia. CKD.  Sonographer:     Mikki Harbor Referring Phys:  [id number] Lorretta Harp Diagnosing Phys: Julien Nordmann MD  Sonographer Comments: Image acquisition challenging due to COPD. Global longitudinal strain was attempted. IMPRESSIONS  1. Left ventricular ejection fraction, by estimation, is 25 to 30%. Left ventricular ejection fraction by PLAX is 27 %. The left ventricle has severely decreased function. The left ventricle demonstrates global hypokinesis, akinesis of the distal anteroseptal and apical regions. There is moderate left ventricular hypertrophy. Left ventricular diastolic parameters are consistent with Grade I diastolic dysfunction (impaired relaxation). The average left ventricular global longitudinal strain is -3.8 %. The global longitudinal strain is abnormal.  2. Right ventricular systolic function is moderately reduced. The right ventricular size is normal. Tricuspid regurgitation signal is inadequate for assessing PA pressure.  3. The mitral valve is normal in structure. Mild to moderate mitral valve regurgitation. No evidence of mitral stenosis.  4. The aortic valve is tricuspid. There is mild calcification of the aortic valve. Aortic valve regurgitation is not visualized. Aortic valve sclerosis/calcification is present, without any evidence of aortic stenosis. Aortic valve mean gradient measures 4.0 mmHg.  5. The inferior vena cava is normal in size with <50% respiratory variability, suggesting right atrial pressure of 8 mmHg. FINDINGS  Left Ventricle: Left ventricular ejection fraction, by estimation, is 25 to 30%. Left ventricular ejection fraction by PLAX is 27 %. The left ventricle has severely decreased function. The left ventricle demonstrates global hypokinesis. The average left  ventricular global longitudinal strain is -3.8 %. Strain was performed and the global longitudinal strain is abnormal. The left  ventricular internal cavity size was normal in size. There is moderate left ventricular hypertrophy. Left ventricular diastolic parameters are consistent with Grade I diastolic dysfunction (impaired relaxation). Right Ventricle: The right ventricular size is normal. No increase in right ventricular wall thickness. Right ventricular systolic function is moderately reduced. Tricuspid regurgitation signal is  inadequate for assessing PA pressure. Left Atrium: Left atrial size was normal in size. Right Atrium: Right atrial size was normal in size. Pericardium: There is no evidence of pericardial effusion. Mitral Valve: The mitral valve is normal in structure. Mild to moderate mitral valve regurgitation. No evidence of mitral valve stenosis. MV peak gradient, 2.6 mmHg. The mean mitral valve gradient is 1.0 mmHg. Tricuspid Valve: The tricuspid valve is normal in structure. Tricuspid valve regurgitation is mild . No evidence of tricuspid stenosis. Aortic Valve: The aortic valve is tricuspid. There is mild calcification of the aortic valve. Aortic valve regurgitation is not visualized. Aortic valve sclerosis/calcification is present, without any evidence of aortic stenosis. Aortic valve mean gradient measures 4.0 mmHg. Aortic valve peak gradient measures 6.2 mmHg. Aortic valve area, by VTI measures 2.12 cm. Pulmonic Valve: The pulmonic valve was normal in structure. Pulmonic valve regurgitation is mild. No evidence of pulmonic stenosis. Aorta: The aortic root is normal in size and structure. Venous: The inferior vena cava is normal in size with less than 50% respiratory variability, suggesting right atrial pressure of 8 mmHg. IAS/Shunts: No atrial level shunt detected by color flow Doppler. Additional Comments: 3D was performed not requiring image post processing on an independent workstation and was indeterminate. There is a small pleural effusion in the left lateral region.  LEFT VENTRICLE PLAX 2D LV EF:         Left             Diastology                ventricular     LV e' medial:    3.70 cm/s                ejection        LV E/e' medial:  20.2                fraction by     LV e' lateral:   4.90 cm/s                PLAX is 27      LV E/e' lateral: 15.3                %. LVIDd:         4.10 cm         2D Longitudinal LVIDs:         3.60 cm         Strain LV PW:         1.60 cm         2D Strain GLS   -3.8 % LV IVS:        1.70 cm         Avg: LVOT diam:     2.10 cm LV SV:         49 LV SV Index:   30 LVOT Area:     3.46 cm  LV Volumes (MOD) LV vol d, MOD    78.6 ml A2C: LV vol d, MOD    49.6 ml A4C: LV vol s, MOD    50.2 ml A2C: LV vol s, MOD    36.3 ml A4C: LV SV MOD A2C:   28.4 ml LV SV MOD A4C:   49.6 ml LV SV MOD BP:    21.1 ml RIGHT VENTRICLE RV Basal diam:  4.15 cm RV Mid diam:    2.80 cm RV S prime:     6.53 cm/s LEFT  ATRIUM              Index        RIGHT ATRIUM           Index LA diam:        3.70 cm  2.29 cm/m   RA Area:     21.90 cm LA Vol (A2C):   117.0 ml 72.43 ml/m  RA Volume:   77.10 ml  47.73 ml/m LA Vol (A4C):   42.9 ml  26.56 ml/m LA Biplane Vol: 70.9 ml  43.89 ml/m  AORTIC VALVE                    PULMONIC VALVE AV Area (Vmax):    2.23 cm     PV Vmax:       0.68 m/s AV Area (Vmean):   1.83 cm     PV Peak grad:  1.8 mmHg AV Area (VTI):     2.12 cm AV Vmax:           125.00 cm/s AV Vmean:          94.000 cm/s AV VTI:            0.230 m AV Peak Grad:      6.2 mmHg AV Mean Grad:      4.0 mmHg LVOT Vmax:         80.40 cm/s LVOT Vmean:        49.800 cm/s LVOT VTI:          0.141 m LVOT/AV VTI ratio: 0.61  AORTA Ao Root diam: 3.30 cm MITRAL VALVE MV Area (PHT): 4.80 cm    SHUNTS MV Area VTI:   2.19 cm    Systemic VTI:  0.14 m MV Peak grad:  2.6 mmHg    Systemic Diam: 2.10 cm MV Mean grad:  1.0 mmHg MV Vmax:       0.80 m/s MV Vmean:      57.7 cm/s MV Decel Time: 158 msec MV E velocity: 74.80 cm/s MV A velocity: 74.20 cm/s MV E/A ratio:  1.01 Julien Nordmann MD Electronically signed by Julien Nordmann MD  Signature Date/Time: 02/11/2024/4:45:57 PM    Final    DG Chest Port 1 View Result Date: 02/11/2024 CLINICAL DATA:  [id number] Pleural effusion on left [id number] Status post thoracentesis [id number] Pleural effusion on right [id number] EXAM: PORTABLE CHEST 1 VIEW COMPARISON:  02/10/2024. FINDINGS: There are diffuse alveolar and interstitial opacities throughout the left lung with lower lobe gradient, which appears significantly increased since the prior study from yesterday. Left lower hemithorax opacity appears essentially unchanged. There is small left pleural effusion, grossly similar to the prior study. Significant interval decrease in the right pleural effusion, status post thoracentesis. No pneumothorax. Evaluation of cardiomediastinal silhouette is nondiagnostic due to left lower hemithorax opacification. No acute osseous abnormalities. The soft tissues are within normal limits. IMPRESSION: 1. Significant interval decrease in the right pleural effusion, status post thoracentesis. No pneumothorax. 2. Significant interval increase in the diffuse alveolar and interstitial opacities throughout the left lung with lower lobe gradient. Findings are nonspecific and differential diagnosis includes combination of asymmetric pulmonary edema with or without aspiration pneumonia. Pre-existing left lower hemithorax opacity appears essentially unchanged. Electronically Signed   By: Jules Schick M.D.   On: 02/11/2024 14:44   US THORACENTESIS ASP PLEURAL SPACE W/IMG GUIDE Result Date: 02/11/2024 INDICATION: 89 year old male with currently in the ED with right pleural effusion on BiPAP  for diagnostic and therapeutic thoracentesis. EXAM: ULTRASOUND GUIDED RIGHT THORACENTESIS MEDICATIONS: 10 mL 1% lidocaine COMPLICATIONS: None immediate. PROCEDURE: An ultrasound guided thoracentesis was thoroughly discussed with the patient and questions answered. The benefits, risks, alternatives and complications were also discussed. The  patient understands and wishes to proceed with the procedure. Written consent was obtained. Ultrasound was performed to localize and mark an adequate pocket of fluid in the right chest. The area was then prepped and draped in the normal sterile fashion. 1% Lidocaine was used for local anesthesia. Under ultrasound guidance a 8 Fr Safe-T-Centesis catheter was introduced. Thoracentesis was performed. The catheter was removed and a dressing applied. FINDINGS: A total of approximately 900 mL of clear, dark yellow fluid was removed. Samples were sent to the laboratory as requested by the clinical team. IMPRESSION: Successful ultrasound guided right thoracentesis yielding 900 mL of pleural fluid. Performed By Theresa Mulligan, PA-C Electronically Signed   By: Malachy Moan M.D.   On: 02/11/2024 14:25   DG Chest Portable 1 View Result Date: 02/10/2024 CLINICAL DATA:  sob EXAM: PORTABLE CHEST 1 VIEW COMPARISON:  Chest x-ray 02/03/2024, CT chest 11/23/2017 FINDINGS: The heart and mediastinal contours are unchanged. Atherosclerotic plaque. Interval development of patchy airspace opacity of the left mid lung zone. Pulmonary edema. Interval increase in size of a small to moderate left pleural effusion. Grossly stable at least small right pleural effusion. Calcified subcentimeter nodules again noted overlying the left upper lobe. No pneumothorax. No acute osseous abnormality. IMPRESSION: 1. Pulmonary edema with likely superimposed infection of the left mid lung zone. 2. Interval increase in size of a small to moderate left pleural effusion. 3. Grossly stable at least small right pleural effusion. Electronically Signed   By: Tish Frederickson M.D.   On: 02/10/2024 20:35   DG Chest 2 View Result Date: 02/03/2024 CLINICAL DATA:  Chest pressure. EXAM: CHEST - 2 VIEW COMPARISON:  February 12, 2023 FINDINGS: Multiple sternal wires and vascular clips are noted. The heart size and mediastinal contours are within normal limits. Mild,  diffuse, chronic appearing increased interstitial lung markings are seen with mild left basilar and mild to moderate severity right basilar atelectasis and/or infiltrate. Stable subcentimeter calcified lung nodules are seen within the left upper lobe. Small bilateral pleural effusions are also seen. No pneumothorax is identified. Radiopaque surgical clips are seen within the right upper quadrant. Multilevel degenerative changes are seen throughout the thoracic spine. IMPRESSION: 1. Evidence of prior median sternotomy/CABG. 2. Mild left basilar and mild to moderate severity right basilar atelectasis and/or infiltrate. 3. Small bilateral pleural effusions. Electronically Signed   By: Aram Candela M.D.   On: 02/03/2024 18:57    Microbiology: Recent Results (from the past 240 hours)  Resp panel by RT-PCR (RSV, Flu A&B, Covid) Anterior Nasal Swab     Status: None   Collection Time: 02/10/24  6:54 PM   Specimen: Anterior Nasal Swab  Result Value Ref Range Status   SARS Coronavirus 2 by RT PCR NEGATIVE NEGATIVE Final    Comment: (NOTE) SARS-CoV-2 target nucleic acids are NOT DETECTED.  The SARS-CoV-2 RNA is generally detectable in upper respiratory specimens during the acute phase of infection. The lowest concentration of SARS-CoV-2 viral copies this assay can detect is 138 copies/mL. A negative result does not preclude SARS-Cov-2 infection and should not be used as the sole basis for treatment or other patient management decisions. A negative result may occur with  improper specimen collection/handling, submission of specimen other than nasopharyngeal swab,  presence of viral mutation(s) within the areas targeted by this assay, and inadequate number of viral copies(<138 copies/mL). A negative result must be combined with clinical observations, patient history, and epidemiological information. The expected result is Negative.  Fact Sheet for Patients:   BloggerCourse.com  Fact Sheet for Healthcare Providers:  SeriousBroker.it  This test is no t yet approved or cleared by the [place] FDA and  has been authorized for detection and/or diagnosis of SARS-CoV-2 by FDA under an Emergency Use Authorization (EUA). This EUA will remain  in effect (meaning this test can be used) for the duration of the COVID-19 declaration under Section 564(b)(1) of the Act, 21 U.S.C.section 360bbb-3(b)(1), unless the authorization is terminated  or revoked sooner.       Influenza A by PCR NEGATIVE NEGATIVE Final   Influenza B by PCR NEGATIVE NEGATIVE Final    Comment: (NOTE) The Xpert Xpress SARS-CoV-2/FLU/RSV plus assay is intended as an aid in the diagnosis of influenza from Nasopharyngeal swab specimens and should not be used as a sole basis for treatment. Nasal washings and aspirates are unacceptable for Xpert Xpress SARS-CoV-2/FLU/RSV testing.  Fact Sheet for Patients: BloggerCourse.com  Fact Sheet for Healthcare Providers: SeriousBroker.it  This test is not yet approved or cleared by the [place] FDA and has been authorized for detection and/or diagnosis of SARS-CoV-2 by FDA under an Emergency Use Authorization (EUA). This EUA will remain in effect (meaning this test can be used) for the duration of the COVID-19 declaration under Section 564(b)(1) of the Act, 21 U.S.C. section 360bbb-3(b)(1), unless the authorization is terminated or revoked.     Resp Syncytial Virus by PCR NEGATIVE NEGATIVE Final    Comment: (NOTE) Fact Sheet for Patients: BloggerCourse.com  Fact Sheet for Healthcare Providers: SeriousBroker.it  This test is not yet approved or cleared by the [place] FDA and has been authorized for detection and/or diagnosis of SARS-CoV-2 by FDA under an Emergency Use  Authorization (EUA). This EUA will remain in effect (meaning this test can be used) for the duration of the COVID-19 declaration under Section 564(b)(1) of the Act, 21 U.S.C. section 360bbb-3(b)(1), unless the authorization is terminated or revoked.  Performed at [facility], [address] [place], [place] [address]      Labs: Basic Metabolic Panel: Recent Labs  Lab 02/10/24 1854 02/11/24 0458 02/11/24 0855 02/12/24 0430 02/13/24 0504 02/14/24 0248  NA 139 142  --  142 138 136  K 5.1 5.6* 4.3 4.5 4.2 4.1  CL 102 108  --  106 98 99  CO2 27 27  --  26 29 29   GLUCOSE 345* 71  --  50* 135* 229*  BUN 38* 40*  --  43* 53* 59*  CREATININE 1.50* 1.52*  --  1.65* 1.74* 1.61*  CALCIUM 8.8* 8.3*  --  8.1* 8.3* 8.0*  MG  --  2.3  --   --   --   --    Liver Function Tests: Recent Labs  Lab 02/10/24 1854  AST 84*  ALT 65*  ALKPHOS 89  BILITOT 0.8  PROT 5.9*  ALBUMIN 3.7   No results for input(s): "LIPASE", "AMYLASE" in the last 168 hours. No results for input(s): "AMMONIA" in the last 168 hours. CBC: Recent Labs  Lab 02/10/24 1854 02/11/24 0458 02/12/24 0430 02/13/24 0504  WBC 6.2 7.5 8.0 7.2  NEUTROABS 4.8  --   --   --   HGB 13.5 13.0 12.9* 12.0*  HCT 42.3 40.5  40.7 37.7*  MCV 103.4* 104.4* 105.2* 105.9*  PLT 184 188 164 157   Cardiac Enzymes: No results for input(s): "CKTOTAL", "CKMB", "CKMBINDEX", "TROPONINI" in the last 168 hours. BNP: BNP (last 3 results) Recent Labs    02/10/24 1854  BNP 1,652.9*    ProBNP (last 3 results) No results for input(s): "PROBNP" in the last 8760 hours.  CBG: Recent Labs  Lab 02/13/24 2358 02/14/24 0431 02/14/24 0814 02/14/24 0817 02/14/24 1118  GLUCAP 244* 252* >600* 208* 215*       Signed:  Silvano Bilis MD.  [facility] 02/17/2024, 10:36 AM

## 2024-04-01 DEATH — deceased
